# Patient Record
Sex: Female | Born: 1945 | Race: White | Hispanic: No | Marital: Married | Smoking: Former smoker
Health system: Southern US, Community
[De-identification: ages and names within clinical notes are randomized; demographics above are authoritative.]

## PROBLEM LIST (undated history)

## (undated) DIAGNOSIS — Z87898 Personal history of other specified conditions: Secondary | ICD-10-CM

## (undated) DIAGNOSIS — T8201XA Breakdown (mechanical) of heart valve prosthesis, initial encounter: Secondary | ICD-10-CM

## (undated) DIAGNOSIS — I350 Nonrheumatic aortic (valve) stenosis: Secondary | ICD-10-CM

## (undated) DIAGNOSIS — I771 Stricture of artery: Secondary | ICD-10-CM

## (undated) DIAGNOSIS — K219 Gastro-esophageal reflux disease without esophagitis: Secondary | ICD-10-CM

## (undated) DIAGNOSIS — C4492 Squamous cell carcinoma of skin, unspecified: Secondary | ICD-10-CM

## (undated) DIAGNOSIS — I2510 Atherosclerotic heart disease of native coronary artery without angina pectoris: Secondary | ICD-10-CM

## (undated) DIAGNOSIS — R0683 Snoring: Secondary | ICD-10-CM

## (undated) DIAGNOSIS — J309 Allergic rhinitis, unspecified: Secondary | ICD-10-CM

## (undated) DIAGNOSIS — I447 Left bundle-branch block, unspecified: Secondary | ICD-10-CM

## (undated) DIAGNOSIS — R7611 Nonspecific reaction to tuberculin skin test without active tuberculosis: Secondary | ICD-10-CM

## (undated) DIAGNOSIS — I509 Heart failure, unspecified: Secondary | ICD-10-CM

## (undated) DIAGNOSIS — Z87440 Personal history of urinary (tract) infections: Secondary | ICD-10-CM

## (undated) DIAGNOSIS — R011 Cardiac murmur, unspecified: Secondary | ICD-10-CM

## (undated) DIAGNOSIS — R112 Nausea with vomiting, unspecified: Secondary | ICD-10-CM

## (undated) DIAGNOSIS — I779 Disorder of arteries and arterioles, unspecified: Secondary | ICD-10-CM

## (undated) DIAGNOSIS — Z8679 Personal history of other diseases of the circulatory system: Secondary | ICD-10-CM

## (undated) DIAGNOSIS — R7302 Impaired glucose tolerance (oral): Secondary | ICD-10-CM

## (undated) DIAGNOSIS — Z8719 Personal history of other diseases of the digestive system: Secondary | ICD-10-CM

## (undated) DIAGNOSIS — M549 Dorsalgia, unspecified: Secondary | ICD-10-CM

## (undated) DIAGNOSIS — E785 Hyperlipidemia, unspecified: Secondary | ICD-10-CM

## (undated) DIAGNOSIS — K649 Unspecified hemorrhoids: Secondary | ICD-10-CM

## (undated) DIAGNOSIS — M1990 Unspecified osteoarthritis, unspecified site: Secondary | ICD-10-CM

## (undated) DIAGNOSIS — Z90710 Acquired absence of both cervix and uterus: Secondary | ICD-10-CM

## (undated) DIAGNOSIS — Z952 Presence of prosthetic heart valve: Secondary | ICD-10-CM

## (undated) DIAGNOSIS — J189 Pneumonia, unspecified organism: Secondary | ICD-10-CM

## (undated) DIAGNOSIS — G8929 Other chronic pain: Secondary | ICD-10-CM

## (undated) DIAGNOSIS — K5732 Diverticulitis of large intestine without perforation or abscess without bleeding: Secondary | ICD-10-CM

## (undated) DIAGNOSIS — C801 Malignant (primary) neoplasm, unspecified: Secondary | ICD-10-CM

## (undated) DIAGNOSIS — I219 Acute myocardial infarction, unspecified: Secondary | ICD-10-CM

## (undated) DIAGNOSIS — K5790 Diverticulosis of intestine, part unspecified, without perforation or abscess without bleeding: Secondary | ICD-10-CM

## (undated) DIAGNOSIS — Z9889 Other specified postprocedural states: Secondary | ICD-10-CM

## (undated) DIAGNOSIS — R350 Frequency of micturition: Secondary | ICD-10-CM

## (undated) HISTORY — DX: Acute myocardial infarction, unspecified: I21.9

## (undated) HISTORY — PX: ENDOMETRIAL ABLATION: SHX621

## (undated) HISTORY — DX: Hyperlipidemia, unspecified: E78.5

## (undated) HISTORY — DX: Left bundle-branch block, unspecified: I44.7

## (undated) HISTORY — DX: Allergic rhinitis, unspecified: J30.9

## (undated) HISTORY — DX: Atherosclerotic heart disease of native coronary artery without angina pectoris: I25.10

## (undated) HISTORY — DX: Nonspecific reaction to tuberculin skin test without active tuberculosis: R76.11

## (undated) HISTORY — PX: BACK SURGERY: SHX140

## (undated) HISTORY — PX: DILATION AND CURETTAGE OF UTERUS: SHX78

## (undated) HISTORY — DX: Stricture of artery: I77.1

## (undated) HISTORY — PX: ABDOMINAL HYSTERECTOMY: SHX81

## (undated) HISTORY — DX: Impaired glucose tolerance (oral): R73.02

## (undated) HISTORY — PX: TONSILLECTOMY: SHX5217

## (undated) HISTORY — DX: Gastro-esophageal reflux disease without esophagitis: K21.9

## (undated) HISTORY — DX: Squamous cell carcinoma of skin, unspecified: C44.92

## (undated) HISTORY — PX: SKIN SURGERY: SHX2413

## (undated) HISTORY — DX: Acquired absence of both cervix and uterus: Z90.710

## (undated) HISTORY — DX: Other chronic pain: G89.29

## (undated) HISTORY — DX: Disorder of arteries and arterioles, unspecified: I77.9

## (undated) HISTORY — DX: Diverticulosis of intestine, part unspecified, without perforation or abscess without bleeding: K57.90

## (undated) HISTORY — DX: Diverticulitis of large intestine without perforation or abscess without bleeding: K57.32

## (undated) HISTORY — DX: Pneumonia, unspecified organism: J18.9

## (undated) HISTORY — DX: Personal history of other diseases of the circulatory system: Z86.79

## (undated) HISTORY — DX: Dorsalgia, unspecified: M54.9

## (undated) HISTORY — PX: BREAST LUMPECTOMY: SHX2

## (undated) HISTORY — PX: LUMBAR DISC SURGERY: SHX700

## (undated) HISTORY — PX: TONSILLECTOMY: SUR1361

## (undated) HISTORY — DX: Cardiac murmur, unspecified: R01.1

## (undated) HISTORY — PX: LAPAROSCOPIC LYSIS INTESTINAL ADHESIONS: SUR778

## (undated) HISTORY — DX: Nonrheumatic aortic (valve) stenosis: I35.0

---

## 1971-02-03 HISTORY — PX: APPENDECTOMY: SHX54

## 2000-02-03 DIAGNOSIS — I219 Acute myocardial infarction, unspecified: Secondary | ICD-10-CM

## 2000-02-03 HISTORY — DX: Acute myocardial infarction, unspecified: I21.9

## 2001-11-04 ENCOUNTER — Encounter: Payer: Self-pay | Admitting: Cardiology

## 2003-04-30 ENCOUNTER — Encounter: Payer: Self-pay | Admitting: Cardiology

## 2003-11-12 ENCOUNTER — Encounter: Payer: Self-pay | Admitting: Internal Medicine

## 2003-11-15 ENCOUNTER — Encounter: Payer: Self-pay | Admitting: Internal Medicine

## 2005-04-28 ENCOUNTER — Encounter: Payer: Self-pay | Admitting: Internal Medicine

## 2005-05-12 ENCOUNTER — Encounter: Payer: Self-pay | Admitting: Internal Medicine

## 2009-04-30 ENCOUNTER — Encounter
Admission: RE | Admit: 2009-04-30 | Discharge: 2009-04-30 | Payer: Self-pay | Admitting: Physical Medicine and Rehabilitation

## 2009-05-17 ENCOUNTER — Encounter (INDEPENDENT_AMBULATORY_CARE_PROVIDER_SITE_OTHER): Payer: Self-pay | Admitting: Orthopedic Surgery

## 2009-05-17 ENCOUNTER — Ambulatory Visit (HOSPITAL_COMMUNITY): Admission: RE | Admit: 2009-05-17 | Discharge: 2009-05-19 | Payer: Self-pay | Admitting: Orthopedic Surgery

## 2010-02-02 DIAGNOSIS — Z952 Presence of prosthetic heart valve: Secondary | ICD-10-CM

## 2010-02-02 HISTORY — DX: Presence of prosthetic heart valve: Z95.2

## 2010-02-02 HISTORY — PX: CORONARY ARTERY BYPASS GRAFT: SHX141

## 2010-02-02 HISTORY — PX: CARDIAC VALVE REPLACEMENT: SHX585

## 2010-02-02 HISTORY — PX: CARDIAC CATHETERIZATION: SHX172

## 2010-02-07 ENCOUNTER — Other Ambulatory Visit: Payer: Self-pay | Admitting: Internal Medicine

## 2010-02-07 ENCOUNTER — Encounter: Payer: Self-pay | Admitting: Internal Medicine

## 2010-02-07 ENCOUNTER — Ambulatory Visit
Admission: RE | Admit: 2010-02-07 | Discharge: 2010-02-07 | Payer: Self-pay | Source: Home / Self Care | Attending: Internal Medicine | Admitting: Internal Medicine

## 2010-02-07 DIAGNOSIS — K219 Gastro-esophageal reflux disease without esophagitis: Secondary | ICD-10-CM | POA: Insufficient documentation

## 2010-02-07 DIAGNOSIS — I38 Endocarditis, valve unspecified: Secondary | ICD-10-CM

## 2010-02-07 DIAGNOSIS — K5732 Diverticulitis of large intestine without perforation or abscess without bleeding: Secondary | ICD-10-CM | POA: Insufficient documentation

## 2010-02-07 DIAGNOSIS — J309 Allergic rhinitis, unspecified: Secondary | ICD-10-CM | POA: Insufficient documentation

## 2010-02-07 DIAGNOSIS — Z8679 Personal history of other diseases of the circulatory system: Secondary | ICD-10-CM | POA: Insufficient documentation

## 2010-02-07 HISTORY — DX: Endocarditis, valve unspecified: I38

## 2010-02-07 LAB — BASIC METABOLIC PANEL
BUN: 13 mg/dL (ref 6–23)
CO2: 29 mEq/L (ref 19–32)
Calcium: 9.2 mg/dL (ref 8.4–10.5)
Chloride: 101 mEq/L (ref 96–112)
Creatinine, Ser: 0.8 mg/dL (ref 0.4–1.2)
GFR: 82.04 mL/min (ref >60.00)
Glucose, Bld: 87 mg/dL (ref 70–99)
Potassium: 4.6 mEq/L (ref 3.5–5.1)
Sodium: 139 mEq/L (ref 135–145)

## 2010-02-07 LAB — CBC WITH DIFFERENTIAL/PLATELET
Basophils Absolute: 0.1 10*3/uL (ref 0.0–0.1)
Basophils Relative: 0.5 % (ref 0.0–3.0)
Eosinophils Absolute: 0.1 10*3/uL (ref 0.0–0.7)
Eosinophils Relative: 1 % (ref 0.0–5.0)
HCT: 39.3 % (ref 36.0–46.0)
Hemoglobin: 13.7 g/dL (ref 12.0–15.0)
Lymphocytes Relative: 23.8 % (ref 12.0–46.0)
Lymphs Abs: 2.3 10*3/uL (ref 0.7–4.0)
MCHC: 34.8 g/dL (ref 30.0–36.0)
MCV: 90.4 fl (ref 78.0–100.0)
Monocytes Absolute: 0.6 10*3/uL (ref 0.1–1.0)
Monocytes Relative: 6.4 % (ref 3.0–12.0)
Neutro Abs: 6.5 10*3/uL (ref 1.4–7.7)
Neutrophils Relative %: 68.3 % (ref 43.0–77.0)
Platelets: 283 10*3/uL (ref 150.0–400.0)
RBC: 4.35 Mil/uL (ref 3.87–5.11)
RDW: 12.5 % (ref 11.5–14.6)
WBC: 9.5 10*3/uL (ref 4.5–10.5)

## 2010-02-07 LAB — TSH: TSH: 3.81 u[IU]/mL (ref 0.35–5.50)

## 2010-02-07 LAB — LIPID PANEL
Cholesterol: 261 mg/dL — ABNORMAL HIGH (ref 0–200)
HDL: 40.3 mg/dL (ref >39.00)
Total CHOL/HDL Ratio: 6
Triglycerides: 596 mg/dL — ABNORMAL HIGH (ref 0.0–149.0)
VLDL: 119.2 mg/dL — ABNORMAL HIGH (ref 0.0–40.0)

## 2010-02-07 LAB — HEPATIC FUNCTION PANEL
ALT: 26 U/L (ref 0–35)
AST: 21 U/L (ref 0–37)
Albumin: 4.1 g/dL (ref 3.5–5.2)
Alkaline Phosphatase: 70 U/L (ref 39–117)
Bilirubin, Direct: 0.1 mg/dL (ref 0.0–0.3)
Total Bilirubin: 0.4 mg/dL (ref 0.3–1.2)
Total Protein: 7 g/dL (ref 6.0–8.3)

## 2010-02-07 LAB — LDL CHOLESTEROL, DIRECT: Direct LDL: 170.6 mg/dL

## 2010-02-12 ENCOUNTER — Encounter: Payer: Self-pay | Admitting: Internal Medicine

## 2010-02-12 ENCOUNTER — Ambulatory Visit (HOSPITAL_COMMUNITY)
Admission: RE | Admit: 2010-02-12 | Discharge: 2010-02-12 | Payer: Self-pay | Source: Home / Self Care | Attending: Internal Medicine | Admitting: Internal Medicine

## 2010-02-12 ENCOUNTER — Ambulatory Visit: Admission: RE | Admit: 2010-02-12 | Discharge: 2010-02-12 | Payer: Self-pay | Source: Home / Self Care

## 2010-02-20 ENCOUNTER — Telehealth (INDEPENDENT_AMBULATORY_CARE_PROVIDER_SITE_OTHER): Payer: Self-pay | Admitting: *Deleted

## 2010-02-20 ENCOUNTER — Encounter: Payer: Self-pay | Admitting: Internal Medicine

## 2010-02-20 ENCOUNTER — Ambulatory Visit
Admission: RE | Admit: 2010-02-20 | Discharge: 2010-02-20 | Payer: Self-pay | Source: Home / Self Care | Attending: Cardiology | Admitting: Cardiology

## 2010-02-20 DIAGNOSIS — E785 Hyperlipidemia, unspecified: Secondary | ICD-10-CM | POA: Insufficient documentation

## 2010-02-20 DIAGNOSIS — R0602 Shortness of breath: Secondary | ICD-10-CM | POA: Insufficient documentation

## 2010-02-21 ENCOUNTER — Ambulatory Visit
Admission: RE | Admit: 2010-02-21 | Discharge: 2010-02-21 | Payer: Self-pay | Source: Home / Self Care | Attending: Internal Medicine | Admitting: Internal Medicine

## 2010-02-21 DIAGNOSIS — N952 Postmenopausal atrophic vaginitis: Secondary | ICD-10-CM | POA: Insufficient documentation

## 2010-02-24 ENCOUNTER — Encounter: Payer: Self-pay | Admitting: Internal Medicine

## 2010-02-24 ENCOUNTER — Encounter (HOSPITAL_COMMUNITY)
Admission: RE | Admit: 2010-02-24 | Discharge: 2010-03-04 | Payer: Self-pay | Source: Home / Self Care | Attending: Cardiology | Admitting: Cardiology

## 2010-02-24 ENCOUNTER — Ambulatory Visit: Admission: RE | Admit: 2010-02-24 | Discharge: 2010-02-24 | Payer: Self-pay | Source: Home / Self Care

## 2010-03-05 ENCOUNTER — Encounter: Payer: Self-pay | Admitting: Cardiology

## 2010-03-06 NOTE — Assessment & Plan Note (Signed)
Summary: new pt/secure horizons/#/lb   Vital Signs:  Patient profile:   65 year old female Height:      63 inches Weight:      172 pounds BMI:     30.58 O2 Sat:      94 % on Room air Temp:     97.5 degrees F oral Pulse rate:   92 / minute BP sitting:   152 / 80  (left arm) Cuff size:   regular  Vitals Entered By: Bill Salinas CMA (February 07, 2010 1:26 PM)  O2 Flow:  Room air CC: New pt here to est care with primary md/ab  Vision Screening:      Vision Comments: Last eye exam was in 2009 with slight change in prescription lens   Primary Care Provider:  Jacques Navy MD  CC:  New pt here to est care with primary md/ab.  History of Present Illness: Patient presents to establish for on-going continuity care.  CC: she is having sinus pressure in the frontal and facial sinuses with pain and pressure. She has a history of sinus problems several times a year. she has had no fever or chills, no coryza or rhinorrhea. She has taken sudafed last night but still sees no improvement.  She has intermittent cramps in the upper abdomen.   Preventive Screening-Counseling & Management  Alcohol-Tobacco     Alcohol drinks/day: <1     >5/day in last 3 mos: no     Smoking Status: quit     Packs/Day: 1.0     Year Quit: 2002  Caffeine-Diet-Exercise     Caffeine use/day: rarely     Does Patient Exercise: no  Hep-HIV-STD-Contraception     Dental Visit-last 6 months no     Sun Exposure-Excessive: no  Safety-Violence-Falls     Seat Belt Use: yes     Helmet Use: n/a     Firearms in the Home: no firearms in the home     Smoke Detectors: yes     Violence in the Home: no risk noted     Sexual Abuse: no     Fall Risk: low fall risk      Blood Transfusions:  no.    Current Medications (verified): 1)  Meclizine Hcl 12.5 Mg Tabs (Meclizine Hcl) .... As Needed For Dizziness  Allergies (verified): 1)  ! Bactrim 2)  ! Amoxicillin 3)  ! 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 Davy Pique - [place]               Past Surgical History: Tonsillectomy '55 Appendectomy '73 Hysterectomy '73 Excision of scar tissue -appy scar Back surgery '92- diskectomy and fusion (Dr. Deneen Harts - [place]) Back surgery -'11 L5-S1 diskectomy and decompression (Dr. 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  The patient denies anorexia, fever, weight loss, weight gain, vision loss, decreased hearing, hoarseness, chest pain, syncope, dyspnea on exertion, peripheral edema, headaches, hemoptysis, abdominal pain, melena, hematochezia, severe indigestion/heartburn, hematuria, incontinence, genital sores, muscle weakness, suspicious skin lesions, transient blindness, difficulty walking, depression, unusual weight change, abnormal bleeding, enlarged lymph nodes, angioedema, and breast masses.         sciatica - right leg.  Physical Exam  General:  overweight white woman in no distress Head:  Normocephalic and atraumatic without obvious abnormalities. No apparent alopecia or balding. No tenderness to  percussion over sinuses Eyes:  vision grossly intact, pupils equal, and pupils round.   Ears:  TMs normal Nose:  no external deformity and no external erythema.   Mouth:  Oral mucosa and oropharynx without lesions or exudates.  Teeth in good repair. Neck:  supple, full ROM, and no thyromegaly.   Lungs:  normal respiratory effort and normal breath sounds.   Heart:  normal rate and regular rhythm.  II/VI systolic murmur RSB, I/VI at LSB Abdomen:  soft and normal bowel sounds.   Msk:  normal ROM.   Neurologic:  alert & oriented X3, cranial nerves II-XII intact, gait normal, and DTRs symmetrical and normal.   Skin:  surgical scar at lumbar spine Cervical Nodes:  no anterior cervical adenopathy and no posterior cervical adenopathy.   Psych:  Oriented X3, memory intact for recent and remote, normally interactive, and good eye contact.     Impression & Recommendations:  Problem # 1:  HEART VALVE DISEASE (ICD-424.90) Patient does have a II/VI murmur at the RSB. Last echo cardiogram was evidently several years ago. With a h/o rheumatic fever follow-up 2 D echo is indicated.  Plan - refer to cardiology for 2 D echo.  Orders: Cardiology Referral (Cardiology) Cardiology Referral (Cardiology)  Problem # 2:  ALLERGIC RHINITIS (ICD-477.9) Patient without signs of bacterial infection, i.e. purulent drainage, fever, sinus tenderness. Suspect her symptoms are allergy related.  Plan - non-sedating antihistamine, e.g. loratadine           continue with sudafed for sinus pressure discomfort.  Problem # 3:  GERD (ICD-530.81) No active problem at this time.  Problem # 4:  Preventive Health Care (ICD-V70.0) Routine labs ordered. She will need to return for a well woman exam with pelvic and PAP smear ( last one if normal) and breast exam. She will check on last colonosocpy date and results. She is due for mammography. 12 lead EKG reveals PAC and a left bndle branch block.  Orders: TLB-Lipid Panel  (80061-LIPID) TLB-Hepatic/Liver Function Pnl (80076-HEPATIC) TLB-BMP (Basic Metabolic Panel-BMET) (80048-METABOL) TLB-CBC Platelet - w/Differential (85025-CBCD) TLB-TSH (Thyroid Stimulating Hormone) (84443-TSH)  addendum - labs reveal elevated cholesterol with LDL of 170. Will need to discuss at return OV.   Complete Medication List: 1)  Meclizine Hcl 12.5 Mg Tabs (Meclizine hcl) .... As needed for dizziness  Other Orders: Radiology Referral (Radiology)  Patient: Heather Crawford Note: All result statuses are Final unless otherwise noted.  Tests: (1) Lipid Panel (LIPID)   Cholesterol          [H]  261 mg/dL  0-200     ATP III Classification            Desirable:  < 200 mg/dL                    Borderline High:  200 - 239 mg/dL               High:  > = 240 mg/dL   Triglycerides        [H]  596.0 mg/dL                 1.6-109.6     Normal:  <150 mg/dL     Borderline High:  045 - 199 mg/dL     Triglyceride is over 400; calculations on Lipids are invalid.   HDL                       40.98 mg/dL                 >11.91   VLDL Cholesterol     [H]  119.2 mg/dL                 4.7-82.9  CHO/HDL Ratio:  CHD Risk                             6                    Men          Women     1/2 Average Risk     3.4          3.3     Average Risk          5.0          4.4     2X Average Risk          9.6          7.1     3X Average Risk          15.0          11.0                           Tests: (2) Hepatic/Liver Function Panel (HEPATIC)   Total Bilirubin           0.4 mg/dL                   5.6-2.1   Direct Bilirubin          0.1 mg/dL                   3.0-8.6   Alkaline Phosphatase      70 U/L                      39-117   AST                       21 U/L                      0-37   ALT                       26 U/L  0-35   Total Protein             7.0 g/dL                    2.4-4.0   Albumin                   4.1 g/dL                     1.0-2.7  Tests: (3) BMP (METABOL)   Sodium                    139 mEq/L                   135-145   Potassium                 4.6 mEq/L                   3.5-5.1   Chloride                  101 mEq/L                   96-112   Carbon Dioxide            29 mEq/L                    19-32   Glucose                   87 mg/dL                    25-36   BUN                       13 mg/dL                    6-44   Creatinine                0.8 mg/dL                   0.3-4.7   Calcium                   9.2 mg/dL                   4.2-59.5   GFR                       82.04 mL/min                >60.00  Tests: (4) CBC Platelet w/Diff (CBCD)   White Cell Count          9.5 K/uL                    4.5-10.5   Red Cell Count            4.35 Mil/uL                 3.87-5.11   Hemoglobin                13.7 g/dL                   63.8-75.6   Hematocrit                39.3 %  36.0-46.0   MCV                       90.4 fl                     78.0-100.0   MCHC                      34.8 g/dL                   32.4-40.1   RDW                       12.5 %                      11.5-14.6   Platelet Count            283.0 K/uL                  150.0-400.0   Neutrophil %              68.3 %                      43.0-77.0   Lymphocyte %              23.8 %                      12.0-46.0   Monocyte %                6.4 %                       3.0-12.0   Eosinophils%              1.0 %                       0.0-5.0   Basophils %               0.5 %                       0.0-3.0   Neutrophill Absolute      6.5 K/uL                    1.4-7.7   Lymphocyte Absolute       2.3 K/uL                    0.7-4.0   Monocyte Absolute         0.6 K/uL                    0.1-1.0  Eosinophils, Absolute                             0.1 K/uL                    0.0-0.7   Basophils Absolute        0.1 K/uL                    0.0-0.1  Tests: (5) TSH (TSH)   FastTSH                   3.81 uIU/mL  0.35-5.50  Tests: (6) Cholesterol LDL - Direct (DIRLDL)  Cholesterol LDL - Direct                             170.6 mg/dL     Optimal:  <244 mg/dL     Near or Above Optimal:  100-129 mg/dL     Borderline High:  010-272 mg/dL     High:  536-644 mg/dL     Very High:  >034 mg/dL  Orders Added: 1)  Radiology Referral [Radiology] 2)  Cardiology Referral [Cardiology] 3)  Cardiology Referral [Cardiology] 4)  New Patient Level IV [99204] 5)  TLB-Lipid Panel [80061-LIPID] 6)  TLB-Hepatic/Liver Function Pnl [80076-HEPATIC] 7)  TLB-BMP (Basic Metabolic Panel-BMET) [80048-METABOL] 8)  TLB-CBC Platelet - w/Differential [85025-CBCD] 9)  TLB-TSH (Thyroid Stimulating Hormone) [74259-DGL]  Appended Document: new pt/secure horizons/#/lb    Clinical Lists Changes  Orders: Added new Service order of Tdap => 47yrs IM (87564) - Signed Added new Service order of Admin 1st Vaccine (33295) - Signed Observations: Added new observation of TD BOOST VIS: 12/21/07 version given February 21, 2010. (02/21/2010 11:09) Added new observation of TD BOOSTERLO: [id number] (02/21/2010 11:09) Added new observation of TD BOOST EXP: 11/22/2011 (02/21/2010 11:09) Added new observation of TD BOOSTERBY: Ami Bullins CMA (02/21/2010 11:09) Added new observation of TD BOOSTERRT: IM (02/21/2010 11:09) Added new observation of TDBOOSTERDSE: 0.5 ml (02/21/2010 11:09) Added new observation of TD BOOSTERMF: GlaxoSmithKline (02/21/2010 11:09) Added new observation of TD BOOST SIT: left deltoid (02/21/2010 11:09) Added new observation of TD BOOSTER: Tdap (02/21/2010 11:09)       Immunizations Administered:  Tetanus Vaccine:    Vaccine Type: Tdap    Site: left deltoid    Mfr: GlaxoSmithKline    Dose: 0.5 ml    Route: IM    Given by: Ami Bullins CMA    Exp. Date: 11/22/2011    Lot #: [id number]    VIS given: 12/21/07 version given February 21, 2010.

## 2010-03-06 NOTE — Progress Notes (Signed)
Summary: Nuc Pre-Procedure  Phone Note Outgoing Call Call back at [facility] Phone [phone]   Call placed by: Antionette Char RN,  February 20, 2010 2:12 PM Call placed to: Patient Reason for Call: Confirm/change Appt Summary of Call: Left message with information on Myoview Information Sheet (see scanned document for details).      Nuclear Med Background Indications for Stress Test: Evaluation for Ischemia   History: Echo  History Comments: 02/12/10 Echo EF 45-50%  Symptoms: Dizziness, SOB    Nuclear Pre-Procedure Cardiac Risk Factors: Family History - CAD, History of Smoking, LBBB Height (in): 63

## 2010-03-06 NOTE — Assessment & Plan Note (Signed)
Summary: well women exam per dr Kelcy Laible/cd   Vital Signs:  Patient profile:   65 year old female Height:      63 inches Weight:      169 pounds BMI:     30.05 O2 Sat:      96 % on Room air Temp:     98.0 degrees F oral Pulse rate:   66 / minute BP sitting:   140 / 78  (left arm) Cuff size:   regular  Vitals Entered By: Ami Bullins CMA (February 21, 2010 11:07 AM)  O2 Flow:  Room air CC: cpx/ ab  Does patient need assistance? Functional Status Self care Ambulation Normal Comments PT is due for mammogram   Primary Care Provider:  Jacques Navy MD  CC:  cpx/ ab.  History of Present Illness: Patient returns after new patient intake earlier in January. She has reviewed that note and has no additions, corrections or deletions. Today she is for well-woman exam with breast exam and vulvo-vaginal exam (s/p hysterectomy) She has no complaints.  In the interval she has had a 2 D echo which does reveal MV regurgitation but normal EF. She has seen Dr. Shirlee Latch and has been scheduled for a myoview stress for further risk stratification.  Her lab had revealed elevated tryglycerides and and LDL of 170. She was started on simvastatin and Lovasa. She reports that the Lovasa is too expensive ($180+). Advised her to try otc fish oil and provided the equivalents whe will need to equal lovasa: 425 EPA 75 DHA. An alternative would be a fenofibrate.  Current Medications (verified): 1)  Meclizine Hcl 12.5 Mg Tabs (Meclizine Hcl) .... As Needed For Dizziness 2)  Antihistamine Decongestant 2.5-60 Mg Tabs (Triprolidine-Pseudoephedrine) .... As Needed 3)  Aspirin 81 Mg Tbec (Aspirin) .... One Daily 4)  Zocor 40 Mg Tabs (Simvastatin) .... One At Bedtime 5)  Lovaza 1 Gm Caps (Omega-3-Acid Ethyl Esters) .... Two Twice A Day  Allergies (verified): 1)  ! Bactrim 2)  ! Amoxicillin 3)  ! Neomycin PMH-FH-SH reviewed-no changes except otherwise noted  Review of Systems  The patient denies anorexia,  fever, weight loss, weight gain, hoarseness, chest pain, dyspnea on exertion, peripheral edema, headaches, abdominal pain, hematochezia, hematuria, muscle weakness, difficulty walking, depression, abnormal bleeding, enlarged lymph nodes, and breast masses.    Physical Exam  General:  Well-developed,well-nourished,in no acute distress; alert,appropriate and cooperative throughout examination Head:  normocephalic.   Breasts:  No mass, nodules, thickening, tenderness, bulging, retraction, inflamation, nipple discharge or skin changes noted.   Lungs:  normal respiratory effort and normal breath sounds.   Heart:  normal rate and regular rhythm.   Genitalia:  normal introitus, no external lesions, and mucosa pink and moist.  Scant vaginal discharge. Mild to moderate vaginal atrophy. Skin:  turgor normal and color normal.   Axillary Nodes:  no R axillary adenopathy and no L axillary adenopathy.   Inguinal Nodes:  no R inguinal adenopathy and no L inguinal adenopathy.     Impression & Recommendations:  Problem # 1:  VAGINITIS, ATROPHIC, POSTMENOPAUSAL (ICD-627.3) Patient with atrophic vaginitis and she does admit to mild dysparunia.  Plan - premarin topical vaginal cream daily for 1 week then twice a week.  Problem # 2:  HYPERLIPIDEMIA-MIXED (ICD-272.4) Patient has started simvastatin. She is tolerating this well. The Lovasa is too expensive and will try to take the equivalent dosing in otc Fish oil.  Plan - follow-up lab as ordered by Dr. Shirlee Latch  Her updated medication list for this problem includes:    Zocor 40 Mg Tabs (Simvastatin) ..... One at bedtime    Lovaza 1 Gm Caps (Omega-3-acid ethyl esters) .Marland Kitchen..Marland Kitchen Two twice a day  Problem # 3:  HEART VALVE DISEASE (ICD-424.90) Patient has had 2 D echo and is scheduled for NST. She will follow up with Dr. Shirlee Latch.   Her updated medication list for this problem includes:    Aspirin 81 Mg Tbec (Aspirin) ..... One daily  Problem # 4:  PREVENTIVE  HEALTH CARE (ICD-V70.0) Normal breast exam and vulvo-vaginal exam. Immunizations - per old records which are pending. Mammogram pending.  Complete Medication List: 1)  Meclizine Hcl 12.5 Mg Tabs (Meclizine hcl) .... As needed for dizziness 2)  Antihistamine Decongestant 2.5-60 Mg Tabs (Triprolidine-pseudoephedrine) .... As needed 3)  Aspirin 81 Mg Tbec (Aspirin) .... One daily 4)  Zocor 40 Mg Tabs (Simvastatin) .... One at bedtime 5)  Lovaza 1 Gm Caps (Omega-3-acid ethyl esters) .... Two twice a day   Orders Added: 1)  Est. Patient Level III [40981]

## 2010-03-06 NOTE — Progress Notes (Signed)
  ROI Faxed to [facility] [place],Records received back gave to [facility] [place]  February 20, 2010 1:39 PM

## 2010-03-06 NOTE — Assessment & Plan Note (Signed)
Summary: np6/heart vaule   Visit Type:  np Primary Provider:  Jacques Navy MD  CC:  shortness of breath and dizzines.  History of Present Illness: 65 yo with history of rheumatic fever and rheumatic valve disease presents to establish cardiology followup.  Patient had an episode as a child that is very consistent with rheumatic fever and has had a heart murmur since that time.  She has been followed by a cardiologist in [facility] in the past for aortic stenosis and mitral regurgitation.  She has chronic exertional dyspnea with walking up a hill or climbing a flight of steps.  She does ok on flat ground.  No chest pain.  No orthopnea or PND.  No tachypalpitations or syncope.  Echo recently showed moderate aortic stenosis and mild to moderate mitral regurgitation.  EF was mildly decreased (45-50%) with abnormal septal motion consistent with LBBB.   ECG: NSR, LBBB  Labs (1/12): TGs 596, HDL 40, LDL 171, HCT 39, K 4.6, creatinine 0.8  Current Medications (verified): 1)  Meclizine Hcl 12.5 Mg Tabs (Meclizine Hcl) .... As Needed For Dizziness 2)  Antihistamine Decongestant 2.5-60 Mg Tabs (Triprolidine-Pseudoephedrine) .... As Needed  Allergies (verified): 1)  ! Bactrim 2)  ! Amoxicillin 3)  ! Neomycin  Past History:  Past Medical History: 1. RHEUMATIC FEVER, HX OF (ICD-V12.59) 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 HEART VALVE DISEASE (ICD-424.90): Echo (1/12) with mildly dilated LV, EF 45-50% with paradoxical septal motion consistent with LBBB, moderate aortic stenosis with mean gradient 27 mmHg, trivial AI, mild to moderate mitral regurgitation with calcified mitral valve.  4. LBBB 5. ALLERGIC RHINITIS (ICD-477.9) 6. GERD (ICD-530.81) 7. Hx of DIVERTICULITIS, ACUTE (ICD-562.11) 8. Chronic low back pain with history of back surgery 9. Appendectomy 10. Hysterectomy 11. Hyperlipidemia  physician roster             Gyn - Dr. Davy Pique - [place]               Family  History: Reviewed history from 02/07/2010 and no changes required. Father - deceased @ 68: CAD/MI, CABG at 37 Mother - deceased @ 89: Uterine cancer, DM, HTN, hypothyroid, breast cancer Sister - breast cancer Aunt - colon cancer  Social History: Reviewed history from 02/07/2010 and no changes required. 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   Vital Signs:  Patient profile:   65 year old female Height:      63 inches Weight:      166.75 pounds BMI:     29.65 Pulse rate:   60 / minute BP sitting:   142 / 72  (left arm) Cuff size:   regular  Vitals Entered By: Caralee Ates CMA (February 20, 2010 12:06 PM)  Physical Exam  General:  Well developed, well nourished, in no acute distress. Head:  normocephalic and atraumatic Nose:  no deformity, discharge, inflammation, or lesions Mouth:  Teeth, gums and palate normal. Oral mucosa normal. Neck:  Neck supple, no JVD. No masses, thyromegaly or abnormal cervical nodes. Lungs:  Clear bilaterally to auscultation and percussion. Heart:  Non-displaced PMI, chest non-tender; regular rate and rhythm, S1,  S2 without rubs or gallops. 2/6 early to mid peaking crescendo-decrescendo murmur RUSB radiating to neck.  Carotid upstroke normal, no bruit.  Pedals normal pulses. No edema, no varicosities. Abdomen:  Bowel sounds positive; abdomen soft and non-tender without masses, organomegaly, or hernias noted. No hepatosplenomegaly. Extremities:  No clubbing or cyanosis. Neurologic:  Alert and oriented x 3. Skin:  Intact without lesions or rashes. Psych:  Normal affect.   Impression & Recommendations:  Problem # 1:  HEART VALVE DISEASE (ICD-424.90) Patient has rheumatic heart disease with moderate aortic stenosis  and mild to moderate mitral regurgitation.  She will need followup over time to assess for progression of valvular disease.  As she has no history of endocarditis, she does not have to take antibiotic prophylaxis with dental work according to the most recent guidelines.  Repeat echo in January 2013.   Problem # 2:  SHORTNESS OF BREATH (ICD-786.05) Patient has exertional dyspnea.  She is not volume overloaded on exam.  LV systolic function is mildly decreased.  This may be explained by paradoxical septal motion from the left bundle branch block.  I suspect that the main cause of this dyspnea is a degree of diastolic dysfunction.  Given her exertional dyspnea, risk factors (hyperlipidemia), and left bundle branch block, will do a stress test to assess for evidence of significant CAD.  She will need an adenosine study given her LBBB.  She should take ASA 81 mg daily.   Problem # 3:  HYPERLIPIDEMIA-MIXED (ICD-272.4) High LDL and dangerously high triglycerides (risk of pancreatitis).  I will start her on simvastatin 40 mg daily as well as Lovaza 2 g two times a day (alternatively, she could take fenofibrate 145 mg daily if this is less expensive for her).  Needs repeat lipids/LFTs in 2 months.  Needs to follow a heart healthy diet and get more exercise.   Other Orders: Echocardiogram (Echo) Nuclear Stress Test (Nuc Stress Test)  Patient Instructions: 1)  Your physician has recommended you make the following change in your medication:  2)  Start Aspirin 81mg  daily--this should be enteric coated. 3)  Start Zocor(simvastatin) 40mg  daily in the evening. 4)  Start Lovaza 2grams twice a day--this will be two 1gram capsules twice a day. 5)  Your physician recommends that you return for a FASTING lipid profile/liver profile in 2 months 786.09  396.2  272.0 6)  Your physician has requested that you have an adenosine myoview.  For further information please visit [email or web address]  Please follow instruction  sheet, as given. 7)  Your physician has requested that you have an echocardiogram.  Echocardiography is a painless test that uses sound waves to create images of your heart. It provides your doctor with information about the size and shape of your heart and how well your heart's chambers and valves are working.  This procedure takes approximately one hour. There are no restrictions for this procedure.JANUARY 2013 8)  Your physician wants you to follow-up in: 6 months with Dr Harmon Dun 2012)  You will receive a reminder letter in the mail two months in advance. If you don't receive a letter, please call our office to schedule the follow-up appointment. Prescriptions: LOVAZA 1 GM CAPS (OMEGA-3-ACID ETHYL ESTERS) two twice a day  #120 x 6   Entered by:   Katina Dung, RN, BSN   Authorized by:   Marca Ancona, MD   Signed by:   Katina Dung, RN, BSN on 02/20/2010   Method used:   Electronically to  [facility] [phone]* (retail)       [address] [place], [place]  [address]       Ph: [phone]       Fax: [phone]   RxID:   [id number] ZOCOR 40 MG TABS (SIMVASTATIN) one at bedtime  #30 x 3   Entered by:   Katina Dung, RN, BSN   Authorized by:   Marca Ancona, MD   Signed by:   Katina Dung, RN, BSN on 02/20/2010   Method used:   Electronically to        [id number]* (retail)       [address] [place], [place]  [address]       Ph: [phone]       Fax: [phone]   RxID:   [id number]

## 2010-03-06 NOTE — Letter (Signed)
   [facility] [address] [place], [place]  [address] Phone: [phone]      February 20, 2010   [place] [address] [place], [place] [address]  RE:  LAB RESULTS  Dear  Ms. Seiter,  The following is an interpretation of your most recent lab tests.  Please take note of any instructions provided or changes to medications that have resulted from your lab work.   2 D echo reveals mild aortic valve regurgitation. Ther is calcificatin of the mitral valve with mild to moderate regurgitation - thus you should have antibiotic prophylaxis before dental cleaning or other procedures.  Please come see me if you have any questions about these lab results    Sincerely Yours,    Jacques Navy MD

## 2010-03-06 NOTE — Assessment & Plan Note (Addendum)
Summary: Cardiology Nuclear Testing  Nuclear Med Background Indications for Stress Test: Evaluation for Ischemia, Abnormal EKG   History: Echo  History Comments: 02/12/10 Echo EF 45-50%  Symptoms: Dizziness, SOB    Nuclear Pre-Procedure Cardiac Risk Factors: Family History - CAD, History of Smoking, LBBB Caffeine/Decaff Intake: none NPO After: 7:30 AM Lungs: clear IV 0.9% NS with Angio Cath: 22g     IV Site: R Hand IV Started by: Cathlyn Parsons, RN Chest Size (in) 36      Cup Size D     Height (in): 63 Weight (lb): 167 BMI: 29.69  Nuclear Med Study 1 or 2 day study:  1 day     Stress Test Type:  Adenosine Reading MD:  Dietrich Pates, MD     Referring MD:  D.McLean Resting Radionuclide:  Technetium 6m Tetrofosmin     Resting Radionuclide Dose:  11.0 mCi  Stress Radionuclide:  Technetium 29m Tetrofosmin     Stress Radionuclide Dose:  33.0 mCi   Stress Protocol  Max Systolic BP: 137 mm HgDose of Adenosine:  42.5 mg    Stress Test Technologist:  Milana Na, EMT-P     Nuclear Technologist:  Doyne Keel, CNMT  Rest Procedure  Myocardial perfusion imaging was performed at rest 45 minutes following the intravenous administration of Technetium 70m Tetrofosmin.  Stress Procedure  The patient received IV adenosine at 140 mcg/kg/min for 4 minutes. There were no significant changes with infusion. Technetium 63m Tetrofosmin was injected at the 2 minute mark and quantitative spect images were obtained after a 45 minute delay.  QPS Raw Data Images:  Images were motion corrected.  Soft tissue (diaphragm, bowel activity, breast) surround heart. Stress Images:  Defect in the atherior wall (mid, distal) septal wall (base, mid) and apex.   Rest Images:  Partial improvement in the anterior wall (mid, distal) septal wall (mid) and apex. Subtraction (SDS):  These findings are consistent with ischemia. Transient Ischemic Dilatation:  1.07  (Normal <1.22)  Lung/Heart Ratio:  0.28   (Normal <0.45)  Quantitative Gated Spect Images QGS EDV:  99 ml QGS ESV:  44 ml QGS EF:  56 % QGS cine images:  distal anterior hypokinesis.   Overall Impression  Exercise Capacity: Lexiscan with no exercise. BP Response: Normal blood pressure response. Clinical Symptoms: No chest pain ECG Impression: Nondiagnostic because of baseline LBBB. Overall Impression Comments: Ischemia with scar in the anterior wall (mid, distal); septal wall (base, mid) and apex. Cannot completely exclude some shifting of soft tissue (breast) but this does not appear to explain all fo findings.  Appended Document: Cardiology Nuclear Testing Myoview was concerning for ischemia in the LAD distribution.  I will need to see her back in the office soon to discuss this.  It sounds like she may need cardiac cath.   Appended Document: Cardiology Nuclear Testing [facility]   Appended Document: Cardiology Nuclear Testing--please try to call her--Thanks. [facility]   Appended Document: Cardiology Nuclear Testing LMTCB./CY  Appended Document: Cardiology Nuclear Testing lmtcb./cy  Appended Document: Cardiology Nuclear Testing [facility]  Appended Document: Cardiology Nuclear Testing I mailed pt letter asking her to call to discuss results  Appended Document: Cardiology Nuclear Testing [facility]   Appended Document: Cardiology Nuclear Testing [facility]   Appended Document: Cardiology Nuclear Testing [facility]  Appended Document: Cardiology Nuclear Testing [facility]   Appended Document: Cardiology Nuclear Testing We have been unable to get in touch with this patient despite multiple phone calls and a letter.   Appended Document:  Cardiology Nuclear Testing attempted to contact pt at [phone] again--not able to leave a message at this number --I am unable to locate an alternate number for this pt  Appended Document: Cardiology Nuclear Testing I talked with pt today--she has been out of town--pt states she has been fine,she  denies chest pain/discomfort  and her symptoms are unchanged since she saw Dr Shirlee Latch in the office--I have given her an appt with Dr Shirlee Latch 03/26/10 for followup--I reviewed with Dr Shirlee Latch

## 2010-03-06 NOTE — Progress Notes (Signed)
  Phone Note Other Incoming   Request: Send information Summary of Call: Records received from [employer]. 52 pages forwarded to Dr. Debby Bud for review.

## 2010-03-12 NOTE — Letter (Signed)
Summary: Generic Letter  Architectural technologist, Main Office  [address]   [place], [place] [address]   Phone: [phone]  Fax: [phone]        March 05, 2010 MRN: [id number]    Heather Crawford [address] [place], [place]  [address]    Dear [name],  I have been unable to reach you by telephone to discuss your recent test results. Please call our office so I can discuss Dr Alford Highland recommendations with you.         Sincerely,  Katina Dung, RN, BSN  This letter has been electronically signed by your physician.

## 2010-03-26 ENCOUNTER — Ambulatory Visit (INDEPENDENT_AMBULATORY_CARE_PROVIDER_SITE_OTHER): Payer: PRIVATE HEALTH INSURANCE | Admitting: Cardiology

## 2010-03-26 ENCOUNTER — Encounter: Payer: Self-pay | Admitting: Internal Medicine

## 2010-03-26 ENCOUNTER — Encounter: Payer: Self-pay | Admitting: Cardiology

## 2010-03-26 DIAGNOSIS — R079 Chest pain, unspecified: Secondary | ICD-10-CM

## 2010-03-26 DIAGNOSIS — I10 Essential (primary) hypertension: Secondary | ICD-10-CM | POA: Insufficient documentation

## 2010-03-26 DIAGNOSIS — R9430 Abnormal result of cardiovascular function study, unspecified: Secondary | ICD-10-CM

## 2010-03-26 DIAGNOSIS — I359 Nonrheumatic aortic valve disorder, unspecified: Secondary | ICD-10-CM

## 2010-03-31 ENCOUNTER — Other Ambulatory Visit: Payer: Self-pay | Admitting: Cardiology

## 2010-03-31 ENCOUNTER — Encounter: Payer: Self-pay | Admitting: Internal Medicine

## 2010-03-31 ENCOUNTER — Other Ambulatory Visit (INDEPENDENT_AMBULATORY_CARE_PROVIDER_SITE_OTHER): Payer: PRIVATE HEALTH INSURANCE

## 2010-03-31 DIAGNOSIS — R0789 Other chest pain: Secondary | ICD-10-CM

## 2010-03-31 DIAGNOSIS — R079 Chest pain, unspecified: Secondary | ICD-10-CM

## 2010-03-31 DIAGNOSIS — R9430 Abnormal result of cardiovascular function study, unspecified: Secondary | ICD-10-CM

## 2010-03-31 LAB — CBC WITH DIFFERENTIAL/PLATELET
Basophils Absolute: 0 10*3/uL (ref 0.0–0.1)
Basophils Relative: 0.6 % (ref 0.0–3.0)
Eosinophils Absolute: 0.1 10*3/uL (ref 0.0–0.7)
Hemoglobin: 13 g/dL (ref 12.0–15.0)
Lymphocytes Relative: 30.2 % (ref 12.0–46.0)
Lymphs Abs: 1.8 10*3/uL (ref 0.7–4.0)
MCHC: 34.2 g/dL (ref 30.0–36.0)
MCV: 91.1 fl (ref 78.0–100.0)
Monocytes Absolute: 0.3 10*3/uL (ref 0.1–1.0)
Monocytes Relative: 4.9 % (ref 3.0–12.0)
Neutro Abs: 3.7 10*3/uL (ref 1.4–7.7)
Neutrophils Relative %: 63.1 % (ref 43.0–77.0)
Platelets: 244 10*3/uL (ref 150.0–400.0)

## 2010-03-31 LAB — BASIC METABOLIC PANEL
Creatinine, Ser: 0.6 mg/dL (ref 0.4–1.2)
GFR: 102.15 mL/min (ref >60.00)

## 2010-03-31 LAB — PROTIME-INR: INR: 0.9 ratio (ref 0.8–1.0)

## 2010-04-01 NOTE — Assessment & Plan Note (Signed)
Summary: rov/ appt at 8:00 per [facility]   Primary Provider:  Jacques Navy MD  CC:  her e to discuss test results/symptoms unchanged.  History of Present Illness: 65 yo with history of rheumatic fever and rheumatic valve disease presents for followup.  Patient had an episode as a child that is very consistent with rheumatic fever and has had a heart murmur since that time.  She has been followed by a cardiologist in [facility] in the past for aortic stenosis and mitral regurgitation.   Echo recently showed moderate aortic stenosis and mild to moderate mitral regurgitation.  EF was mildly decreased (45-50%) with abnormal septal motion consistent with LBBB.    Patient reports dyspnea with moderate exertion such as after walking 100 feet or walking up an incline. She also gets a tightness/pain in her neck when doing the same activities.  No chest pain.  No neck tightness at rest.  We did an adenosine myoview with EF 56% and evidence for ischemia and scar in the mid to apical anterior wall, septal wall, and apex.  BP is elevated today at 154/80.   ECG: NSR, LBBB  Labs (1/12): TGs 596, HDL 40, LDL 171, HCT 39, K 4.6, creatinine 0.8  Current Medications (verified): 1)  Meclizine Hcl 12.5 Mg Tabs (Meclizine Hcl) .... As Needed For Dizziness 2)  Claritin 10 Mg Tabs (Loratadine) .... One Daily 3)  Aspirin 81 Mg Tbec (Aspirin) .... One Daily 4)  Zocor 40 Mg Tabs (Simvastatin) .... One At Bedtime 5)  Lovaza 1 Gm Caps (Omega-3-Acid Ethyl Esters) .... One Three Times A Day 6)  Zantac 150 Mg Tabs (Ranitidine Hcl) .... One Daily 7)  Coreg 6.25 Mg Tabs (Carvedilol) .... One Twice A Day  Allergies: 1)  ! Bactrim 2)  ! Amoxicillin 3)  ! Neomycin 4)  ! * Adhesive Tape  Past History:  Past Medical History: 1. RHEUMATIC FEVER, HX OF (ICD-V12.59) 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 HEART VALVE DISEASE (ICD-424.90): Echo (1/12) with mildly dilated LV, EF 45-50% with paradoxical septal motion  consistent with LBBB, moderate aortic stenosis with mean gradient 27 mmHg, trivial AI, mild to moderate mitral regurgitation with calcified mitral valve.  4. LBBB 5. ALLERGIC RHINITIS (ICD-477.9) 6. GERD (ICD-530.81) 7. Hx of DIVERTICULITIS, ACUTE (ICD-562.11) 8. Chronic low back pain with history of back surgery 9. Appendectomy 10. Hysterectomy 11. Hyperlipidemia 12. CAD: Adenosine myoview (2/12) with EF 56%, ischemia with scar in the mid to apical anterior wall, septal wall, and apex.   physician roster             Gyn - Dr. Davy Pique - [place]               Family History: Reviewed history from 02/07/2010 and no changes required. Father - deceased @ 32: CAD/MI, CABG at 71 Mother - deceased @ 82: Uterine cancer, DM, HTN, hypothyroid, breast cancer Sister - breast cancer Aunt - colon cancer  Social History: Reviewed history from 02/20/2010 and no changes required. 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   Vital Signs:  Patient profile:   65 year old  female Height:      63 inches Weight:      171.50 pounds Pulse rate:   76 / minute BP sitting:   154 / 80  (right arm)  Vitals Entered By: Katina Dung, RN, BSN (March 26, 2010 8:31 AM)  Physical Exam  General:  Well developed, well nourished, in no acute distress. Neck:  Neck supple, no JVD. No masses, thyromegaly or abnormal cervical nodes. Lungs:  Clear bilaterally to auscultation and percussion. Heart:  Non-displaced PMI, chest non-tender; regular rate and rhythm, S1, S2 without rubs or gallops. 2/6 SEM RUSB.  Carotid upstroke normal, no bruit.  Pedals normal pulses. No edema, no varicosities. Abdomen:  Bowel sounds positive; abdomen soft and non-tender without  masses, organomegaly, or hernias noted. No hepatosplenomegaly. Extremities:  No clubbing or cyanosis. Neurologic:  Alert and oriented x 3. Psych:  Normal affect.   Impression & Recommendations:  Problem # 1:  CHEST PAIN (ICD-786.50) Patient has exertional neck tightness/pain and significant exertional dyspnea.  She has a LBBB.  Myoview was suggestive of possible LAD disease.  Her symptoms could certainly be angina.  I am going to arrange for a left heart catheterization to assess for CAD.  Continue ASA and statin, I will add a beta bllocker (especially with elevated BP).   Problem # 2:  HYPERLIPIDEMIA-MIXED (ICD-272.4) Check lipids/LFTs in 3/12.  If she has CAD on cath, goal LDL will be < 70.   Problem # 3:  HEART VALVE DISEASE (ICD-424.90) Patient has rheumatic heart disease with moderate aortic stenosis and mild to moderate mitral regurgitation.  She will need followup over time to assess for progression of valvular disease.  As she has no history of endocarditis, she does not have to take antibiotic prophylaxis with dental work according to the most recent guidelines.  Repeat echo in January 2013.   Problem # 4:  HYPERTENSION, UNSPECIFIED (ICD-401.9) Given elevated BP and possible coronary disease, starting carvedilol 6.25 mg two times a day.   Other Orders: Cardiac Catheterization (Cardiac Cath)  Patient Instructions: 1)  Your physician has recommended you make the following change in your medication:  2)  Start Coreg 6.25mg  twice a day. 3)  Your physician recommends that you return for lab work on Monday February 27,2012--BMP/CBC/PT 786.50  794.30 4)  Your physician has requested that you have a cardiac catheterization.  Cardiac catheterization is used to diagnose and/or treat various heart conditions. Doctors may recommend this procedure for a number of different reasons. The most common reason is to evaluate chest pain. Chest pain can be a symptom of coronary artery disease (CAD),  and cardiac catheterization can show whether plaque is narrowing or blocking your heart's arteries. This procedure is also used to evaluate the valves, as well as measure the blood flow and oxygen levels in different parts of your heart.  For further information please visit [email or web address]  Please follow instruction sheet, as given. 5)  MARCH 1,2012  6)  Your physician recommends that you schedule a follow-up appointment in: 2 weeks after the cath 04/03/10. 7)  Your physician recommends that you return for a FASTING lipid profile/liver profile in MARCH 2012  424.1  794.30 786.50:  Prescriptions: COREG 6.25 MG TABS (CARVEDILOL) one twice a day  #180 x 3   Entered by:   Katina Dung, RN, BSN   Authorized by:   Marca Ancona, MD   Signed by:   Katina Dung, RN, BSN on 03/26/2010   Method used:   Electronically  to        [id number] (retail)       [address] [place], [place]  [address]       Ph: [phone]       Fax: [phone]   RxID:   [id number]     Vital Signs:  Patient profile:   65 year old female Height:      63 inches Weight:      171.50 pounds Pulse rate:   76 / minute BP sitting:   154 / 80  (right arm)  Vitals Entered By: Katina Dung, RN, BSN (March 26, 2010 8:31 AM)   Serial Vital Signs/Assessments:  Comments: 152/78 left arm sitting  By: Katina Dung, RN, BSN

## 2010-04-01 NOTE — Letter (Signed)
Summary: [facility] - Stress  [facility]   Imported By: Marylou Mccoy 03/26/2010 10:21:03  _____________________________________________________________________  External Attachment:    Type:   Image     Comment:   External Document

## 2010-04-01 NOTE — Letter (Signed)
Summary: Cardiac Catheterization Instructions- [facility]  [facility], Main Office  [address]   [place], [place] [address]   Phone: [phone]  Fax: [phone]     03/26/2010 MRN: [id number]  Heather Crawford [address] [place], [place]  [address]  [place]  Dear Ms. Tomasetti,   You are scheduled for a Cardiac Catheterization on Thursday March 1,2012 with Dr.Dalton Shirlee Latch.  Please arrive to the 1st floor of the Heart and Vascular Center at [facility] at 7 am  on the day of your procedure. Please do not arrive before 6:30 a.m. Call the Heart and Vascular Center at [phone] if you are unable to make your appointment. The Code to get into the parking garage under the building is 3000. Take the elevators to the 1st floor. You must have someone to drive you home. Someone must be with you for the first 24 hours after you arrive home. Please wear clothes that are easy to get on and off and wear slip-on shoes. Do not eat or drink after midnight except water with your medications that morning. Bring all your medications and current insurance cards with you.  _   _x__ You may take ALL of your medications with water that morning. ________________________________________________________________________________________________________________________________   The usual length of stay after your procedure is 2 to 3 hours. This can vary.  If you have any questions, please call the office at the number listed above.   Katina Dung, RN, BSN

## 2010-04-03 ENCOUNTER — Ambulatory Visit (HOSPITAL_COMMUNITY): Payer: Medicare Other | Attending: Cardiovascular Disease

## 2010-04-03 ENCOUNTER — Inpatient Hospital Stay (HOSPITAL_BASED_OUTPATIENT_CLINIC_OR_DEPARTMENT_OTHER)
Admission: RE | Admit: 2010-04-03 | Discharge: 2010-04-03 | Disposition: A | Discharge status: Home / Self Care | Payer: Medicare Other | Source: Ambulatory Visit | Attending: Cardiology | Admitting: Cardiology

## 2010-04-03 DIAGNOSIS — R0989 Other specified symptoms and signs involving the circulatory and respiratory systems: Secondary | ICD-10-CM | POA: Insufficient documentation

## 2010-04-03 DIAGNOSIS — M542 Cervicalgia: Secondary | ICD-10-CM | POA: Insufficient documentation

## 2010-04-03 DIAGNOSIS — I2510 Atherosclerotic heart disease of native coronary artery without angina pectoris: Secondary | ICD-10-CM | POA: Insufficient documentation

## 2010-04-03 DIAGNOSIS — R0602 Shortness of breath: Secondary | ICD-10-CM | POA: Insufficient documentation

## 2010-04-03 DIAGNOSIS — R0609 Other forms of dyspnea: Secondary | ICD-10-CM | POA: Insufficient documentation

## 2010-04-03 DIAGNOSIS — Z01810 Encounter for preprocedural cardiovascular examination: Secondary | ICD-10-CM | POA: Insufficient documentation

## 2010-04-03 DIAGNOSIS — R9439 Abnormal result of other cardiovascular function study: Secondary | ICD-10-CM | POA: Insufficient documentation

## 2010-04-03 DIAGNOSIS — I209 Angina pectoris, unspecified: Secondary | ICD-10-CM | POA: Insufficient documentation

## 2010-04-03 DIAGNOSIS — I099 Rheumatic heart disease, unspecified: Secondary | ICD-10-CM | POA: Insufficient documentation

## 2010-04-07 ENCOUNTER — Encounter: Payer: Self-pay | Admitting: Internal Medicine

## 2010-04-08 ENCOUNTER — Observation Stay (HOSPITAL_COMMUNITY)
Admission: RE | Admit: 2010-04-08 | Discharge: 2010-04-09 | DRG: 287 | Disposition: A | Discharge status: Home / Self Care | Payer: PRIVATE HEALTH INSURANCE | Source: Ambulatory Visit | Attending: Cardiology | Admitting: Cardiology

## 2010-04-08 DIAGNOSIS — I2510 Atherosclerotic heart disease of native coronary artery without angina pectoris: Secondary | ICD-10-CM

## 2010-04-08 DIAGNOSIS — I359 Nonrheumatic aortic valve disorder, unspecified: Secondary | ICD-10-CM | POA: Insufficient documentation

## 2010-04-08 DIAGNOSIS — G8929 Other chronic pain: Secondary | ICD-10-CM | POA: Insufficient documentation

## 2010-04-08 DIAGNOSIS — E785 Hyperlipidemia, unspecified: Secondary | ICD-10-CM | POA: Insufficient documentation

## 2010-04-08 DIAGNOSIS — M545 Low back pain, unspecified: Secondary | ICD-10-CM | POA: Insufficient documentation

## 2010-04-08 DIAGNOSIS — Z87898 Personal history of other specified conditions: Secondary | ICD-10-CM | POA: Insufficient documentation

## 2010-04-08 DIAGNOSIS — J309 Allergic rhinitis, unspecified: Secondary | ICD-10-CM | POA: Insufficient documentation

## 2010-04-08 DIAGNOSIS — K219 Gastro-esophageal reflux disease without esophagitis: Secondary | ICD-10-CM | POA: Insufficient documentation

## 2010-04-08 DIAGNOSIS — I447 Left bundle-branch block, unspecified: Secondary | ICD-10-CM | POA: Insufficient documentation

## 2010-04-08 DIAGNOSIS — I059 Rheumatic mitral valve disease, unspecified: Secondary | ICD-10-CM | POA: Insufficient documentation

## 2010-04-08 LAB — CBC: MCV: 88.9 fL (ref 78.0–100.0)

## 2010-04-08 LAB — BASIC METABOLIC PANEL
BUN: 15 mg/dL (ref 6–23)
CO2: 25 mEq/L (ref 19–32)
Chloride: 104 mEq/L (ref 96–112)
Creatinine, Ser: 0.73 mg/dL (ref 0.4–1.2)
GFR calc Af Amer: >60 mL/min (ref >60)
Potassium: 4 mEq/L (ref 3.5–5.1)

## 2010-04-08 LAB — POCT ACTIVATED CLOTTING TIME: Activated Clotting Time: 476 seconds

## 2010-04-09 DIAGNOSIS — I200 Unstable angina: Secondary | ICD-10-CM

## 2010-04-09 LAB — BASIC METABOLIC PANEL
Chloride: 104 mEq/L (ref 96–112)
Creatinine, Ser: 0.69 mg/dL (ref 0.4–1.2)
GFR calc Af Amer: >60 mL/min (ref >60)
GFR calc non Af Amer: >60 mL/min (ref >60)

## 2010-04-09 LAB — PLATELET INHIBITION P2Y12
P2Y12 % Inhibition: 49 %
Platelet Function  P2Y12: 211 [PRU] (ref 194–418)

## 2010-04-09 LAB — CBC
MCH: 30.1 pg (ref 26.0–34.0)
Platelets: 229 10*3/uL (ref 150–400)
RBC: 3.65 MIL/uL — ABNORMAL LOW (ref 3.87–5.11)
RDW: 13.1 % (ref 11.5–15.5)
WBC: 7.3 10*3/uL (ref 4.0–10.5)

## 2010-04-10 NOTE — Letter (Signed)
Summary: [facility] - Echo  [facility] - Echo   Imported By: Marylou Mccoy 03/26/2010 10:22:40  _____________________________________________________________________  External Attachment:    Type:   Image     Comment:   External Document

## 2010-04-11 NOTE — Procedures (Signed)
Heather Crawford, Heather Crawford NO.:  [id number]  MEDICAL RECORD NO.:  [id number]           PATIENT TYPE:  LOCATION:                                 FACILITY:  PHYSICIAN:  Marca Ancona, MD      DATE OF BIRTH:  1945-09-07  DATE OF PROCEDURE:  04/03/2010 DATE OF DISCHARGE:                           CARDIAC CATHETERIZATION   PROCEDURES: 1. Left heart catheterization. 2. Coronary angiography. 3. Left ventriculography.  INDICATION:  This is a 65 year old who has a history of several months of exertional neck tightness and dyspnea.  She had a Myoview done to make sure ischemia is scarred in the mid apical anterior wall, septal wall, and the apex.  Therefore, she has elected to have left heart catheterization.  PROCEDURE NOTE:  After informed consent was obtained, the patient underwent Allen's testing on her right wrist.  The right ulnar artery gave good collateral circulation in the radial side of the hand constituting positive Allen's test.  The right radial area was sterilely prepped and draped.  Lidocaine 1% was used to locally anesthetize the right radial area.  The right radial artery was entered using modified Seldinger technique and 5-French arterial sheath was placed.  The patient received 3 mg of intra-arterial verapamil and 4000 units of IV heparin.  The right coronary artery was engaged best using the 3-D RCA catheter, though we did not engage it directly.  We were able to get adequate opacification of the artery.  The left coronary artery was engaged using the JL-3.5 catheter and the left ventricle was entered using angled pigtail catheter.  There were no complications.  FINDINGS: 1. Hemodynamics.  Aorta 111/46, LV 140/14.  Peak aortic valve gradient     was measured to be 27 mmHg. 2. Left ventriculography.  EF was estimated to be 55% with perhaps     mild basilar inferior hypokinesis. 3. Right coronary artery.  The right coronary artery was a dominant    vessel.  There was a 40-50% ostial RCA stenosis.  The mid RCA was calcified. There was a discrete     area of 90% to 95% stenosis in the distal RCA. 4. Left main.  The left main had no angiographic coronary artery     disease. 5. Left circumflex system.  There was a large ramus intermedius with     no significant disease.  The circumflex itself provided 2     relatively small obtuse marginals and had no significant disease. 6. LAD system.  There was a large first diagonal with minimal disease.     The mid LAD had about 30% stenosis.  IMPRESSION:  This is a 66 year old with a history of rheumatic heart disease who has had symptoms for several months consistent with stable angina.  She will get tightness in her neck and shortness of breath when she walks up a hill and it is more heavy with exertion.  She had a Myoview that was actually suggestive of the LAD territory ischemia in the mid LAD territory ischemia and infarction.  However, the patient's LAD has no significant disease on left heart cath today.  She is  actually found to have a 90% to 95% distal RCA stenosis as well as mild to moderate ostial disease and calcified mid RCA.  Given the stability of her symptoms, I am going to load her with Plavix over the weekend.  She is not a diabetic and I am also  going to start her on Imdur 30 mg daily.  She will have percutaneous coronary intervention on April 08, 2010, to her RCA with Dr. Riley Kill.     Marca Ancona, MD     DM/MEDQ  D:  04/03/2010  T:  04/03/2010  Job:  [id number]  cc:   Rosalyn Gess. Norins, MD  Electronically Signed by Marca Ancona MD on 04/10/2010 09:18:37 AM

## 2010-04-14 ENCOUNTER — Encounter: Payer: Self-pay | Admitting: Internal Medicine

## 2010-04-15 NOTE — Letter (Signed)
Summary: [facility]: CDC Pre-Cath Orders   [facility]: CDC Pre-Cath Orders   Imported By: Earl Many 04/10/2010 17:23:37  _____________________________________________________________________  External Attachment:    Type:   Image     Comment:   External Document

## 2010-04-16 ENCOUNTER — Ambulatory Visit (INDEPENDENT_AMBULATORY_CARE_PROVIDER_SITE_OTHER): Payer: PRIVATE HEALTH INSURANCE | Admitting: Thoracic Surgery (Cardiothoracic Vascular Surgery)

## 2010-04-16 DIAGNOSIS — I359 Nonrheumatic aortic valve disorder, unspecified: Secondary | ICD-10-CM

## 2010-04-16 DIAGNOSIS — I2510 Atherosclerotic heart disease of native coronary artery without angina pectoris: Secondary | ICD-10-CM

## 2010-04-16 DIAGNOSIS — I059 Rheumatic mitral valve disease, unspecified: Secondary | ICD-10-CM

## 2010-04-17 NOTE — Assessment & Plan Note (Signed)
OFFICE VISIT  Heather Crawford, Heather Crawford DOB:  [birth year]                                        April 16, 2010 CHART #:  [id number]  The patient is a 65 year old woman with history of aortic stenosis.  She recently has been having exertional neck tightness and dyspnea.  A Myoview suggest ischemia in the anterior septal wall.  She had echocardiogram done on February 12, 2010, which showed an EF of 40-50%. There was aortic stenosis with a valve area estimated at between 0.8 and 0.94 cm2 of some moderate annular calcification and mild-to-moderate mitral regurgitation.  She then had a cardiac catheterization where she was found to have 90% distal right coronary stenosis.  She is scheduled to have an angioplasty of the right coronary artery.  But at the time of attempted angioplasty, there were technical issues Dr. Riley Kill who is doing the procedure did not feel that he could safely proceed.  I was asked to see the patient in consultation, given the severity or aortic stenosis in combination with the critical lesion in her right coronary, I recommended aortic valve replacement and coronary bypass grafting with a right mammary to the right coronary.  Dr. Shirlee Latch and Dr. Riley Kill were in agreement with that.  We also talked to her about possibly needing a mitral annuloplasty as well but that would depend on the intraoperative assessment of the valve.  The other option was medical therapy, although given the symptomatic nature of her critical right coronary lesion and aortic valve that could be as tight as 0.8 cm2.  I felt the best option was to go ahead and address these issues now.  We did discuss, the relative advantages and disadvantages of each approach as well as the risks and benefits of the surgical procedure.  She had been loaded with Plavix, so she was discharged from the hospital and now returns to further discuss plan for surgery.  She also has a loose filling in  one of the molars on her right side of her mouth.  She has an appointment with her dentist scheduled for next Wednesday to have that filling replaced.  I met again today with Mr. and Mrs. Heather Crawford, her daughter who is a Engineer, civil (consulting) listened in by cell phone once again reviewed the issues while medical therapy is an option.  I do not think it is really good one in her case she has symptomatic critical right coronary stenosis and severe aortic stenosis, although it was read as moderate catheterization with the valve area potential being as tight as 0.8, that is an indication for surgery.  It is really impossible to determine what portion of her potential ischemia might be due to aortic stenosis, and the combination of aortic stenosis and CAD versus just the coronary lesion, and she is somewhat reluctant to proceed with surgery and had some concerns.  We discussed potential timing of the procedure, expected hospital stay, overall recovery, once again discussed that the risk of aortic valve placement, coronary bypass grafting.  In her particular case, I think it is relatively low with approximately 1% risk of operative mortality.  She understands the other risks which has previously been discussed including stroke, MI, complete heart block requiring pacemaker, bleeding, possible need for transfusions, infections, respiratory, or renal complications.  We did again discuss valve options and the preference for tissue valve  in her case and plan for short-term coumadinization afterwards for 6 to 8 weeks until the bowel has healed in, and she can be on an aspirin a day after that.  The patient has decided to proceed with surgery.  We will schedule it for Thursday, May 01, 2010, which is the first day that she is willing to have the procedure done.  She will be admitted on the day of surgery.  Salvatore Decent Dorris Fetch, M.D. Electronically Signed  SCH/MEDQ  D:  04/16/2010  T:  04/17/2010  Job:   [id number]  cc:   Marca Ancona, MD Rosalyn Gess. Norins, MD

## 2010-04-17 NOTE — Consult Note (Signed)
NAMEBRANDII, Heather Crawford            ACCOUNT NO.:  [id number]  MEDICAL RECORD NO.:  [id number]           PATIENT TYPE:  O  LOCATION:  [facility]                         FACILITY:  [facility]  PHYSICIAN:  Salvatore Decent. Dorris Fetch, M.D.DATE OF BIRTH:  January 22, 1946  DATE OF CONSULTATION: DATE OF DISCHARGE:                                CONSULTATION   REASON FOR CONSULTATION:  Single vessel CAD and aortic stenosis  HISTORY OF PRESENT ILLNESS:  Heather Crawford is a 65 year old woman referred for surgical intervention.  She is followed by Dr. Doylene Canning in [facility] in years.  She also has about a 10-year history of episodes where she becomes dizzy, nauseated and shortness of breath.  Currently, she started having new symptoms about 6 months ago when she noticed that when she walked into her garden which is down the hill, after walking about a 100 feet or up an incline she would get tightness in her neck that radiate to her jaws.  She is not having any chest pain or tightness.  There is no radiation to the arm.  She would get short of breath, but was not having any severe nausea or diaphoresis associated with that.  She saw Dr. Debby Bud who referred her to Dr. Shirlee Latch.  An echocardiogram was done which showed moderately severe aortic stenosis with the valve area estimated between 0.8 and 0.94 cm2 with an index of 0.52 cm2/m2.  Mean gradient was 27, peak gradient 41.  She had a nuclear study which showed a question of anterior ischemia and ejection fraction was 56%.  Due to the findings on the nuclear scan, cardiac catheterization was performed on April 03, 2010 which revealed severe disease in right coronary with 30% ostial, there was no significant disease in the LAD or circumflex. stenosis, 90% to 95% distal stenosis, ejection fraction was 55%, peak aortic valve gradient was 27 mmHg.  She was started on Plavix and scheduled for percutaneous intervention, which was to be done today. Dr. Riley Kill was attempting to  access the RCA but there was technical difficulty. He felt this was relatively high risk and he was uncomfortable doing that in the setting of aortic valve disease.  The procedure was aborted.  Past Medical History: Rheumatic fever, left bundle-branch block, gastroesophageal reflux, positive PPD, acute diverticulitis.  Past surgical History: hysterectomy. Medications: Chart not currently available Allergies: Chart not available ROS: See HPI all other systems negative   Physical Exam  General; WD, WN, NAD HEENT: poor dentition, otherwise unremarkable NECK:  Supple. CARDIAC:  Regular rate and rhythm.  There is a 6 systolic murmur heard best along the right sternal border. LUNGS:  Clear. ABDOMEN:  Soft, nontender. EXTREMITIES:  Without clubbing, cyanosis, or edema.  She has 2+ pulses throughout.  LABORATORY DATA:  See HPI for results of stress test, catheterizations and echocardiogram.  Her sodium is 137, potassium 4.0, BUN 15, creatinine 0.7, glucose 129.  PT 12.4, PTT 27.  White count 6.2, hematocrit 39,   Impression:  This is a 65 year old woman with single vessel coronary disease and moderately severe aortic stenosis who is symptomatic. She is not a candidate for PCI of the  right coronary. I agree with Dr. Shirlee Latch and Dr. Riley Kill that the best treament option is to proceed with CABG of the RCA and AVR. The mitral valve will be  evaluated with TEE at the time of surgery and may require an annuloplasty as well. We discussed in detail ther nature of the operation, technical aspects, expected length of stay and overall recovery. We also discussed valve options including tissue and  mechanical valves and their advantages and disadvantages. We discussed the indications, risks, benefits and alternatives(medical therapy). She understands the risks include but are not limited to death, stroke, MI, DVT, PE, bleeding, possible need for  transfusion, infection, CHB requiring permanent  pacemaker, and other organ system dysfunction including respiratory, renal, or GI complications.  She is leaning toward surgery but is undecided at this point in time. She will be discharged. She needs to see a dentist about a loose filling prior to elective surgery. I will see her back in the office next week to further discuss  and finalize her operative plan.     Salvatore Decent Dorris Fetch, M.D.   Note: Edited 04/17/10 large portions of text missing.  SCH/MEDQ  D:  04/08/2010  T:  04/08/2010  Job:  [id number]  Electronically Signed by Charlett Lango M.D. on 04/17/2010 02:51:00 PM

## 2010-04-18 ENCOUNTER — Other Ambulatory Visit (INDEPENDENT_AMBULATORY_CARE_PROVIDER_SITE_OTHER): Payer: Medicare Other

## 2010-04-18 ENCOUNTER — Other Ambulatory Visit: Payer: Self-pay | Admitting: Cardiology

## 2010-04-18 ENCOUNTER — Ambulatory Visit (INDEPENDENT_AMBULATORY_CARE_PROVIDER_SITE_OTHER): Payer: Medicare Other | Admitting: Cardiology

## 2010-04-18 ENCOUNTER — Encounter: Payer: Self-pay | Admitting: Cardiology

## 2010-04-18 DIAGNOSIS — Z79899 Other long term (current) drug therapy: Secondary | ICD-10-CM

## 2010-04-18 DIAGNOSIS — I2510 Atherosclerotic heart disease of native coronary artery without angina pectoris: Secondary | ICD-10-CM

## 2010-04-18 DIAGNOSIS — E785 Hyperlipidemia, unspecified: Secondary | ICD-10-CM

## 2010-04-18 DIAGNOSIS — I359 Nonrheumatic aortic valve disorder, unspecified: Secondary | ICD-10-CM

## 2010-04-18 DIAGNOSIS — I25119 Atherosclerotic heart disease of native coronary artery with unspecified angina pectoris: Secondary | ICD-10-CM | POA: Insufficient documentation

## 2010-04-18 LAB — HEPATIC FUNCTION PANEL
ALT: 18 U/L (ref 0–35)
Albumin: 3.9 g/dL (ref 3.5–5.2)
Bilirubin, Direct: 0.1 mg/dL (ref 0.0–0.3)
Total Protein: 6.5 g/dL (ref 6.0–8.3)

## 2010-04-18 LAB — LIPID PANEL: Triglycerides: 221 mg/dL — ABNORMAL HIGH (ref 0.0–149.0)

## 2010-04-21 ENCOUNTER — Other Ambulatory Visit: Payer: Self-pay

## 2010-04-23 LAB — CBC
HCT: 37 % (ref 36.0–46.0)
Hemoglobin: 12.6 g/dL (ref 12.0–15.0)
MCV: 90.1 fL (ref 78.0–100.0)
RDW: 12.4 % (ref 11.5–15.5)

## 2010-04-23 LAB — BASIC METABOLIC PANEL
BUN: 11 mg/dL (ref 6–23)
Creatinine, Ser: 0.68 mg/dL (ref 0.4–1.2)
GFR calc Af Amer: >60 mL/min (ref >60)
GFR calc non Af Amer: >60 mL/min (ref >60)
Sodium: 138 mEq/L (ref 135–145)

## 2010-04-25 NOTE — Discharge Summary (Signed)
NAMEALANTRA, POPOCA NO.:  [id number]  MEDICAL RECORD NO.:  [id number]           PATIENT TYPE:  I  LOCATION:  2313                         FACILITY:  [facility]  PHYSICIAN:  Marca Ancona, MD      DATE OF BIRTH:  [birth year]  DATE OF ADMISSION:  04/08/2010 DATE OF DISCHARGE:  04/09/2010                              DISCHARGE SUMMARY   PRIMARY CARDIOLOGIST:  Marca Ancona, MD  PRIMARY CARE PROVIDER:  Rosalyn Gess. Norins, MD  DISCHARGE DIAGNOSIS:  Unstable angina.  SECONDARY DIAGNOSES: 1. Severe single-vessel coronary artery disease involving the distal     right coronary artery with inability to perform percutaneous     intervention this admission. 2. Moderate aortic stenosis. 3. Mild-to-moderate mitral regurgitation. 4. Hyperlipidemia. 5. History of rheumatic fever. 6. Left bundle-branch block. 7. Gastroesophageal reflux disease. 8. Allergic rhinitis. 9. Status post appendectomy. 10.Status post hysterectomy. 11.Chronic low back pain, status post back surgery. 12.History of diverticulitis.  ALLERGIES: 1. BACTRIM. 2. AMOXICILLIN. 3. NEOMYCIN. 4. ADHESIVE TAPE.  PROCEDURES:  Attempted percutaneous intervention involving the right coronary artery, which was aborted secondary to inability to obtain adequate guide catheter support.  HISTORY OF PRESENT ILLNESS:  A 65 year old female with prior history of rheumatic fever and known mitral regurgitation and aortic stenosis previously followed by Cardiology in [facility] who earlier this year saw Dr. Shirlee Latch in clinic with complaints of progressive dyspnea.  An outpatient adenosine Myoview was subsequently performed showing EF of 56% with evidence for ischemia and scar in the mid to apical anterior wall, septal wall, and apex.  She followed up in clinic on March 26, 2010.  Decision was made to pursue cardiac catheterization, which was performed on April 03, 2010, showing 90-95% distal right coronary  artery disease with otherwise nonobstructive disease, EF of 55%, and mild inferior hypokinesis.  She was later scheduled for PCI of the RCA.  HOSPITAL COURSE:  The patient presented to the [facility] Cath Lab on April 08, 2010, for planned percutaneous intervention to the right coronary artery.  Unfortunately, the interventional team was unable to adequately seat the guide catheter; and after multiple attempts, the procedure was aborted.  With known valvular disease and severe symptomatic single-vessel coronary artery disease, Thoracic Surgery was consulted, and the patient was evaluated by Dr. Charlett Lango who agreed that she would benefit from bypass grafting as well as aortic valve placement.  The patient was agreeable and Plavix was subsequently discontinued.  Notably, her P2Y12 was elevated at 211 with a 49% inhibition.  The patient has had no further chest pain and will be discharged home today in good condition.  She has a followup with Dr. Dorris Fetch next week on April 16, 2010, to finalize plans for surgical intervention.  DISCHARGE LABORATORIES:  Hemoglobin 11.0, hematocrit 33.1, WBC 7.3, platelets 229, P2Y12 211 with 49% inhibition.  Sodium 137, potassium 4.0, chloride 104, CO2 of 26, BUN 11, creatinine 0.69, glucose 144, calcium 8.3.  MRSA screen was negative.  DISPOSITION:  The patient will be discharged home today in good condition.  FOLLOWUP PLANS AND APPOINTMENTS:  The patient will follow up with Dr. Viviann Spare  Dorris Fetch on April 16, 2010, at 2:00 p.m.  Follow up with Dr. Shirlee Latch on April 18, 2010, at 8:00 a.m. at which time she is scheduled to have repeat lipids and LFTs.  DISCHARGE MEDICATIONS:  1..  Nitroglycerin 0.4 mg sublingual p.r.n. chest pain. 1. Aspirin 325 mg daily. 2. Claritin 10 mg daily. 3. Carvedilol 6.25 mg b.i.d. 4. Imdur 30 mg daily. 5. Lovaza 1 g t.i.d. 6. Meclizine 12.5 mg t.i.d. p.r.n. 7. Tylenol 325 mg q.4 h. p.r.n. headache. 8. Zantac  150 mg daily. 9. Zocor 40 mg nightly.  We have discontinued Plavix.  OUTSTANDING LABORATORY STUDIES:  The patient had lipids and LFT's scheduled in our office on April 18, 2010.  DURATION OF DISCHARGE ENCOUNTER:  45 minutes including physician time.     Nicolasa Ducking, ANP   ______________________________ Marca Ancona, MD    CB/MEDQ  D:  04/09/2010  T:  04/10/2010  Job:  [id number]  cc:   Salvatore Decent. Dorris Fetch, M.D. Rosalyn Gess Norins, MD  Electronically Signed by Nicolasa Ducking ANP on 04/21/2010 12:01:44 PM Electronically Signed by Marca Ancona MD on 04/25/2010 08:32:04 AM

## 2010-04-29 ENCOUNTER — Other Ambulatory Visit: Payer: Self-pay | Admitting: Thoracic Surgery (Cardiothoracic Vascular Surgery)

## 2010-04-29 ENCOUNTER — Telehealth: Payer: Self-pay | Admitting: Cardiology

## 2010-04-29 ENCOUNTER — Encounter (HOSPITAL_COMMUNITY)
Admission: RE | Admit: 2010-04-29 | Discharge: 2010-04-29 | Disposition: A | Discharge status: Home / Self Care | Payer: Medicare Other | Source: Ambulatory Visit | Attending: Thoracic Surgery (Cardiothoracic Vascular Surgery) | Admitting: Thoracic Surgery (Cardiothoracic Vascular Surgery)

## 2010-04-29 ENCOUNTER — Inpatient Hospital Stay (HOSPITAL_COMMUNITY)
Admission: RE | Admit: 2010-04-29 | Discharge: 2010-04-29 | Disposition: A | Discharge status: Home / Self Care | Payer: Medicare Other | Source: Ambulatory Visit | Attending: Thoracic Surgery (Cardiothoracic Vascular Surgery) | Admitting: Thoracic Surgery (Cardiothoracic Vascular Surgery)

## 2010-04-29 ENCOUNTER — Ambulatory Visit (HOSPITAL_COMMUNITY)
Admission: RE | Admit: 2010-04-29 | Discharge: 2010-04-29 | Disposition: A | Discharge status: Home / Self Care | Payer: Medicare Other | Source: Ambulatory Visit | Attending: Thoracic Surgery (Cardiothoracic Vascular Surgery) | Admitting: Thoracic Surgery (Cardiothoracic Vascular Surgery)

## 2010-04-29 DIAGNOSIS — Z01811 Encounter for preprocedural respiratory examination: Secondary | ICD-10-CM | POA: Insufficient documentation

## 2010-04-29 DIAGNOSIS — I2510 Atherosclerotic heart disease of native coronary artery without angina pectoris: Secondary | ICD-10-CM

## 2010-04-29 DIAGNOSIS — F17200 Nicotine dependence, unspecified, uncomplicated: Secondary | ICD-10-CM | POA: Insufficient documentation

## 2010-04-29 DIAGNOSIS — Z01812 Encounter for preprocedural laboratory examination: Secondary | ICD-10-CM | POA: Insufficient documentation

## 2010-04-29 DIAGNOSIS — I080 Rheumatic disorders of both mitral and aortic valves: Secondary | ICD-10-CM | POA: Insufficient documentation

## 2010-04-29 DIAGNOSIS — Q233 Congenital mitral insufficiency: Secondary | ICD-10-CM

## 2010-04-29 DIAGNOSIS — I350 Nonrheumatic aortic (valve) stenosis: Secondary | ICD-10-CM

## 2010-04-29 DIAGNOSIS — Z01810 Encounter for preprocedural cardiovascular examination: Secondary | ICD-10-CM | POA: Insufficient documentation

## 2010-04-29 DIAGNOSIS — R0602 Shortness of breath: Secondary | ICD-10-CM | POA: Insufficient documentation

## 2010-04-29 LAB — COMPREHENSIVE METABOLIC PANEL
ALT: 20 U/L (ref 0–35)
Alkaline Phosphatase: 50 U/L (ref 39–117)
BUN: 8 mg/dL (ref 6–23)
CO2: 23 mEq/L (ref 19–32)
Creatinine, Ser: 0.57 mg/dL (ref 0.4–1.2)
GFR calc Af Amer: >60 mL/min (ref >60)
Total Protein: 6.6 g/dL (ref 6.0–8.3)

## 2010-04-29 LAB — APTT: aPTT: 29 seconds (ref 24–37)

## 2010-04-29 LAB — CBC
HCT: 34.6 % — ABNORMAL LOW (ref 36.0–46.0)
Hemoglobin: 11.4 g/dL — ABNORMAL LOW (ref 12.0–15.0)
MCH: 29.7 pg (ref 26.0–34.0)
MCHC: 32.9 g/dL (ref 30.0–36.0)
MCV: 90.1 fL (ref 78.0–100.0)
Platelets: 287 10*3/uL (ref 150–400)
WBC: 6.3 10*3/uL (ref 4.0–10.5)

## 2010-04-29 LAB — SURGICAL PCR SCREEN: Staphylococcus aureus: NEGATIVE

## 2010-04-29 LAB — URINE MICROSCOPIC-ADD ON

## 2010-04-29 LAB — ABO/RH: ABO/RH(D): A POS

## 2010-04-29 LAB — BLOOD GAS, ARTERIAL
Acid-Base Excess: 0.7 mmol/L (ref 0.0–2.0)
FIO2: 0.21 %
TCO2: 26 mmol/L (ref 0–100)
pCO2 arterial: 39.5 mmHg (ref 35.0–45.0)
pH, Arterial: 7.414 — ABNORMAL HIGH (ref 7.350–7.400)

## 2010-04-29 LAB — URINALYSIS, ROUTINE W REFLEX MICROSCOPIC
Bilirubin Urine: NEGATIVE
Glucose, UA: NEGATIVE mg/dL
Ketones, ur: NEGATIVE mg/dL
Specific Gravity, Urine: 1.007 (ref 1.005–1.030)
pH: 6 (ref 5.0–8.0)

## 2010-04-29 LAB — PROTIME-INR: Prothrombin Time: 12.7 seconds (ref 11.6–15.2)

## 2010-04-29 LAB — HEMOGLOBIN A1C: Hgb A1c MFr Bld: 6.3 % — ABNORMAL HIGH (ref <5.7)

## 2010-04-29 NOTE — Telephone Encounter (Signed)
Stress,LOV faxed to [facility] @ [phone] 04/29/10/KM

## 2010-05-01 ENCOUNTER — Inpatient Hospital Stay (HOSPITAL_COMMUNITY): Payer: Medicare Other

## 2010-05-01 ENCOUNTER — Inpatient Hospital Stay (HOSPITAL_COMMUNITY)
Admission: RE | Admit: 2010-05-01 | Discharge: 2010-05-07 | DRG: 220 | Disposition: A | Discharge status: Home Health | Payer: Medicare Other | Source: Ambulatory Visit | Attending: Thoracic Surgery (Cardiothoracic Vascular Surgery) | Admitting: Thoracic Surgery (Cardiothoracic Vascular Surgery)

## 2010-05-01 ENCOUNTER — Other Ambulatory Visit: Payer: Self-pay | Admitting: Thoracic Surgery (Cardiothoracic Vascular Surgery)

## 2010-05-01 DIAGNOSIS — I209 Angina pectoris, unspecified: Secondary | ICD-10-CM | POA: Diagnosis present

## 2010-05-01 DIAGNOSIS — M545 Low back pain, unspecified: Secondary | ICD-10-CM | POA: Diagnosis present

## 2010-05-01 DIAGNOSIS — Z01810 Encounter for preprocedural cardiovascular examination: Secondary | ICD-10-CM

## 2010-05-01 DIAGNOSIS — J90 Pleural effusion, not elsewhere classified: Secondary | ICD-10-CM | POA: Diagnosis present

## 2010-05-01 DIAGNOSIS — K5732 Diverticulitis of large intestine without perforation or abscess without bleeding: Secondary | ICD-10-CM | POA: Diagnosis present

## 2010-05-01 DIAGNOSIS — D62 Acute posthemorrhagic anemia: Secondary | ICD-10-CM | POA: Diagnosis not present

## 2010-05-01 DIAGNOSIS — I359 Nonrheumatic aortic valve disorder, unspecified: Secondary | ICD-10-CM | POA: Diagnosis present

## 2010-05-01 DIAGNOSIS — J309 Allergic rhinitis, unspecified: Secondary | ICD-10-CM | POA: Diagnosis present

## 2010-05-01 DIAGNOSIS — E785 Hyperlipidemia, unspecified: Secondary | ICD-10-CM | POA: Diagnosis present

## 2010-05-01 DIAGNOSIS — I447 Left bundle-branch block, unspecified: Secondary | ICD-10-CM | POA: Diagnosis present

## 2010-05-01 DIAGNOSIS — K219 Gastro-esophageal reflux disease without esophagitis: Secondary | ICD-10-CM | POA: Diagnosis present

## 2010-05-01 DIAGNOSIS — D696 Thrombocytopenia, unspecified: Secondary | ICD-10-CM | POA: Diagnosis present

## 2010-05-01 DIAGNOSIS — I2510 Atherosclerotic heart disease of native coronary artery without angina pectoris: Secondary | ICD-10-CM

## 2010-05-01 LAB — CBC
HCT: 32.6 % — ABNORMAL LOW (ref 36.0–46.0)
Hemoglobin: 11 g/dL — ABNORMAL LOW (ref 12.0–15.0)
MCH: 29.9 pg (ref 26.0–34.0)
MCH: 29.8 pg (ref 26.0–34.0)
MCHC: 33.7 g/dL (ref 30.0–36.0)
MCHC: 33.5 g/dL (ref 30.0–36.0)
Platelets: 141 10*3/uL — ABNORMAL LOW (ref 150–400)
RBC: 3.52 MIL/uL — ABNORMAL LOW (ref 3.87–5.11)
RDW: 13.7 % (ref 11.5–15.5)

## 2010-05-01 LAB — POCT I-STAT 3, ART BLOOD GAS (G3+)
Acid-base deficit: 1 mmol/L (ref 0.0–2.0)
Acid-base deficit: 1 mmol/L (ref 0.0–2.0)
Patient temperature: 35.6
Patient temperature: 36.8
TCO2: 26 mmol/L (ref 0–100)
TCO2: 26 mmol/L (ref 0–100)
pH, Arterial: 7.353 (ref 7.350–7.400)
pH, Arterial: 7.36 (ref 7.350–7.400)

## 2010-05-01 LAB — POCT I-STAT, CHEM 8
Calcium, Ion: 1.02 mmol/L — ABNORMAL LOW (ref 1.12–1.32)
Creatinine, Ser: 0.7 mg/dL (ref 0.4–1.2)
Glucose, Bld: 156 mg/dL — ABNORMAL HIGH (ref 70–99)
Hemoglobin: 10.5 g/dL — ABNORMAL LOW (ref 12.0–15.0)
Sodium: 139 mEq/L (ref 135–145)
TCO2: 22 mmol/L (ref 0–100)

## 2010-05-01 LAB — PROTIME-INR: INR: 1.28 (ref 0.00–1.49)

## 2010-05-01 LAB — POCT I-STAT 4, (NA,K, GLUC, HGB,HCT)
Glucose, Bld: 117 mg/dL — ABNORMAL HIGH (ref 70–99)
Hemoglobin: 11.2 g/dL — ABNORMAL LOW (ref 12.0–15.0)
Potassium: 3.4 mEq/L — ABNORMAL LOW (ref 3.5–5.1)

## 2010-05-01 LAB — CREATININE, SERUM: GFR calc Af Amer: >60 mL/min (ref >60)

## 2010-05-01 NOTE — Assessment & Plan Note (Signed)
Summary: 2wk f/u from cath done 04/03/10/sl   Primary Provider:  Jacques Navy MD   History of Present Illness: 65 yo with history of rheumatic fever and rheumatic valve disease presents for followup.  Patient had an episode as a child that is very consistent with rheumatic fever and has had a heart murmur since that time.  She has been followed by a cardiologist in [facility] in the past for aortic stenosis and mitral regurgitation.   Echo recently showed moderate aortic stenosis and mild to moderate mitral regurgitation.  EF was mildly decreased (45-50%) with abnormal septal motion consistent with LBBB.   Patient reported dyspnea with moderate exertion such as after walking 100 feet or walking up an incline. She also would get a tightness/pain in her neck when doing the same activities.  No chest pain.  No neck tightness at rest.  We did an adenosine myoview with EF 56% and evidence for ischemia and scar in the mid to apical anterior wall, septal wall, and apex.    I did a left heart cath.  This showed significant RCA disease: 40% ostial, 60% mid, 95% distal.  Dr. Riley Kill attempted to intervene on the RCA but was unable to adequately seat the catheter for intervention.  Given the large territory supplied by the RCA, CVTS was consulted regarding CABG.  She has moderate AS as well as moderate MR as well.  Dr. Dorris Fetch has consulted, and the plan at this time is for CABG with SVG-RCA and bioprosthetic aortic valve +/- mitral valve ring.  Since LHC, patient has not been very active.  She has had only 1 further episode of exertional neck pain (when she was walking in the mall).  Surgery is scheduled for the end of the month.   ECG: NSR, LBBB  Labs (1/12): TGs 596, HDL 40, LDL 171, HCT 39, K 4.6, creatinine  Labs (2/12): K 4.8, creatinine 0.6  Current Medications (verified): 1)  Meclizine Hcl 12.5 Mg Tabs (Meclizine Hcl) .... As Needed For Dizziness 2)  Claritin 10 Mg Tabs (Loratadine) .... One  Daily 3)  Aspirin 81 Mg Tbec (Aspirin) .... One Daily 4)  Zocor 40 Mg Tabs (Simvastatin) .... One At Bedtime 5)  Lovaza 1 Gm Caps (Omega-3-Acid Ethyl Esters) .... One Three Times A Day 6)  Zantac 150 Mg Tabs (Ranitidine Hcl) .... One Daily 7)  Coreg 6.25 Mg Tabs (Carvedilol) .... One Twice A Day 8)  Isosorbide Mononitrate Cr 30 Mg Xr24h-Tab (Isosorbide Mononitrate) .... Take One Tablet By Mouth Daily 9)  Nitrostat 0.4 Mg Subl (Nitroglycerin) .Marland Kitchen.. 1 Tablet Under Tongue At Onset of Chest Pain; You May Repeat Every 5 Minutes For Up To 3 Doses.  Allergies (verified): 1)  ! Bactrim 2)  ! Amoxicillin 3)  ! Neomycin 4)  ! * Adhesive Tape  Past History:  Past Medical History: 1. RHEUMATIC FEVER, HX OF (ICD-V12.59) 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 HEART VALVE DISEASE (ICD-424.90): Echo (1/12) with mildly dilated LV, EF 45-50% with paradoxical septal motion consistent with LBBB, moderate aortic stenosis with mean gradient 27 mmHg, trivial AI, mild to moderate mitral regurgitation with calcified mitral valve.  4. LBBB 5. ALLERGIC RHINITIS (ICD-477.9) 6. GERD (ICD-530.81) 7. Hx of DIVERTICULITIS, ACUTE (ICD-562.11) 8. Chronic low back pain with history of back surgery 9. Appendectomy 10. Hysterectomy 11. Hyperlipidemia 12. CAD: Adenosine myoview (2/12) with EF 56%, ischemia with scar in the mid to apical anterior wall, septal wall, and apex.  LHC (3/12) with  EF 55%, 40% ostial RCA, 60% mid RCA, 95% distal RCA.  Intervention attempted but unable to adequately seat catheter.   physician roster             Gyn - Dr. Davy Pique - [place]               Social History: Reviewed history from 02/20/2010 and no changes required. 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   Vital Signs:  Patient profile:   65 year old female Weight:      173 pounds Pulse rate:   64 / minute Pulse rhythm:   regular BP sitting:   116 / 61  (left arm) Cuff size:   regular  Vitals Entered By: Scherrie Bateman, LPN (April 18, 2010 7:50 AM)  Physical Exam  General:  Well developed, well nourished, in no acute distress. Neck:  Neck supple, no JVD. No masses, thyromegaly or abnormal cervical nodes. Lungs:  Clear bilaterally to auscultation and percussion. Heart:  Non-displaced PMI, chest non-tender; regular rate and rhythm, S1, S2 without rubs or gallops. 2/6 mid-peaking SEM RUSB with some obscuration of S2.  Carotid upstroke normal, no bruit.  Pedals normal pulses. No edema, no varicosities. Abdomen:  Bowel sounds positive; abdomen soft and non-tender without masses, organomegaly, or hernias noted. No hepatosplenomegaly. Extremities:  No clubbing or cyanosis. Neurologic:  Alert and oriented x 3. Psych:  Normal affect.   Impression & Recommendations:  Problem # 1:  CAD, NATIVE VESSEL (ICD-414.01) Severely diseased RCA with stable angina.  There is a large RCA territory at risk.  Difficult to seat catheter for intervention.  I have discussed the situation with Drs Riley Kill and Dorris Fetch.  Patient has moderate AS and moderate MR.  She does not need valve repair/replacement at this time, but likely will progress to need AVR in 2-3 years.  I think that the best approach here with be revascularization of the RCA by CABG.  At the same time, we should go ahead and replace the aortic valve and possibly repair the mitral valve.  She will continue ASA, statin, Coreg, and Imdur.    Problem # 2:  HEART VALVE DISEASE (ICD-424.90) History of rheumatic fever.  As above, patient has moderate AS and moderate MR.  In and of itself, not yet necessary to do surgical repair.  However, will likely progress to surgery in a few years.  Since we are planning to bypass the  RCA, will also replace the aortic valve +/- mitral repair.   Problem # 3:  HYPERLIPIDEMIA-MIXED (ICD-272.4) Lipids/LFTs today with goal LDL < 70.   Other Orders: TLB-Hepatic/Liver Function Pnl (80076-HEPATIC) TLB-Lipid Panel (80061-LIPID)  Patient Instructions: 1)  Your physician recommends that you schedule a follow-up appointment in: 2 MONTHS WITH DR [facility] 2)  Your physician recommends that you return for lab work in: TODAY 3)  Your physician recommends that you continue on your current medications as directed. Please refer to the Current Medication list given to you today.

## 2010-05-02 ENCOUNTER — Inpatient Hospital Stay (HOSPITAL_COMMUNITY): Payer: Medicare Other

## 2010-05-02 LAB — GLUCOSE, CAPILLARY
Glucose-Capillary: 148 mg/dL — ABNORMAL HIGH (ref 70–99)
Glucose-Capillary: 142 mg/dL — ABNORMAL HIGH (ref 70–99)
Glucose-Capillary: 136 mg/dL — ABNORMAL HIGH (ref 70–99)
Glucose-Capillary: 192 mg/dL — ABNORMAL HIGH (ref 70–99)
Glucose-Capillary: 164 mg/dL — ABNORMAL HIGH (ref 70–99)

## 2010-05-02 LAB — POCT I-STAT 4, (NA,K, GLUC, HGB,HCT)
Glucose, Bld: 105 mg/dL — ABNORMAL HIGH (ref 70–99)
Glucose, Bld: 127 mg/dL — ABNORMAL HIGH (ref 70–99)
Glucose, Bld: 135 mg/dL — ABNORMAL HIGH (ref 70–99)
Glucose, Bld: 131 mg/dL — ABNORMAL HIGH (ref 70–99)
HCT: 31 % — ABNORMAL LOW (ref 36.0–46.0)
HCT: 25 % — ABNORMAL LOW (ref 36.0–46.0)
Hemoglobin: 7.5 g/dL — ABNORMAL LOW (ref 12.0–15.0)
Hemoglobin: 8.5 g/dL — ABNORMAL LOW (ref 12.0–15.0)
Potassium: 3.3 mEq/L — ABNORMAL LOW (ref 3.5–5.1)
Potassium: 5.9 mEq/L — ABNORMAL HIGH (ref 3.5–5.1)
Sodium: 143 mEq/L (ref 135–145)
Sodium: 132 mEq/L — ABNORMAL LOW (ref 135–145)

## 2010-05-02 LAB — BASIC METABOLIC PANEL
CO2: 24 mEq/L (ref 19–32)
Chloride: 105 mEq/L (ref 96–112)
GFR calc Af Amer: >60 mL/min (ref >60)
Sodium: 138 mEq/L (ref 135–145)

## 2010-05-02 LAB — POCT I-STAT 3, ART BLOOD GAS (G3+)
Acid-base deficit: 1 mmol/L (ref 0.0–2.0)
Bicarbonate: 26.4 mEq/L — ABNORMAL HIGH (ref 20.0–24.0)
Bicarbonate: 25 mEq/L — ABNORMAL HIGH (ref 20.0–24.0)
Patient temperature: 31.5
Patient temperature: 37
TCO2: 28 mmol/L (ref 0–100)
TCO2: 26 mmol/L (ref 0–100)
pH, Arterial: 7.551 — ABNORMAL HIGH (ref 7.350–7.400)

## 2010-05-02 LAB — POCT I-STAT, CHEM 8
BUN: 14 mg/dL (ref 6–23)
Chloride: 99 mEq/L (ref 96–112)
Creatinine, Ser: 0.9 mg/dL (ref 0.4–1.2)
Potassium: 5.3 mEq/L — ABNORMAL HIGH (ref 3.5–5.1)
Sodium: 131 mEq/L — ABNORMAL LOW (ref 135–145)

## 2010-05-02 LAB — CBC
HCT: 29.6 % — ABNORMAL LOW (ref 36.0–46.0)
HCT: 29.9 % — ABNORMAL LOW (ref 36.0–46.0)
Hemoglobin: 9.8 g/dL — ABNORMAL LOW (ref 12.0–15.0)
Hemoglobin: 9.8 g/dL — ABNORMAL LOW (ref 12.0–15.0)
MCH: 29.7 pg (ref 26.0–34.0)
MCV: 90.6 fL (ref 78.0–100.0)
Platelets: 137 10*3/uL — ABNORMAL LOW (ref 150–400)
RBC: 3.32 MIL/uL — ABNORMAL LOW (ref 3.87–5.11)
RBC: 3.3 MIL/uL — ABNORMAL LOW (ref 3.87–5.11)
WBC: 13.1 10*3/uL — ABNORMAL HIGH (ref 4.0–10.5)

## 2010-05-02 LAB — POCT I-STAT 3, VENOUS BLOOD GAS (G3P V)
Bicarbonate: 24.9 mEq/L — ABNORMAL HIGH (ref 20.0–24.0)
TCO2: 26 mmol/L (ref 0–100)
pCO2, Ven: 30.1 mmHg — ABNORMAL LOW (ref 45.0–50.0)
pH, Ven: 7.501 — ABNORMAL HIGH (ref 7.250–7.300)
pO2, Ven: 29 mmHg — CL (ref 30.0–45.0)

## 2010-05-02 LAB — MAGNESIUM: Magnesium: 2.4 mg/dL (ref 1.5–2.5)

## 2010-05-02 LAB — CREATININE, SERUM: GFR calc non Af Amer: >60 mL/min (ref >60)

## 2010-05-03 ENCOUNTER — Inpatient Hospital Stay (HOSPITAL_COMMUNITY): Payer: Medicare Other

## 2010-05-03 LAB — GLUCOSE, CAPILLARY
Glucose-Capillary: 115 mg/dL — ABNORMAL HIGH (ref 70–99)
Glucose-Capillary: 148 mg/dL — ABNORMAL HIGH (ref 70–99)
Glucose-Capillary: 150 mg/dL — ABNORMAL HIGH (ref 70–99)

## 2010-05-03 LAB — CBC
HCT: 28.7 % — ABNORMAL LOW (ref 36.0–46.0)
Hemoglobin: 9.5 g/dL — ABNORMAL LOW (ref 12.0–15.0)
MCH: 30.1 pg (ref 26.0–34.0)
MCHC: 33.1 g/dL (ref 30.0–36.0)
RBC: 3.16 MIL/uL — ABNORMAL LOW (ref 3.87–5.11)

## 2010-05-03 LAB — BASIC METABOLIC PANEL
BUN: 14 mg/dL (ref 6–23)
Chloride: 98 mEq/L (ref 96–112)
GFR calc non Af Amer: >60 mL/min (ref >60)
Glucose, Bld: 112 mg/dL — ABNORMAL HIGH (ref 70–99)
Potassium: 4.4 mEq/L (ref 3.5–5.1)
Sodium: 132 mEq/L — ABNORMAL LOW (ref 135–145)

## 2010-05-03 LAB — PROTIME-INR
INR: 1.29 (ref 0.00–1.49)
Prothrombin Time: 16.3 seconds — ABNORMAL HIGH (ref 11.6–15.2)

## 2010-05-04 ENCOUNTER — Inpatient Hospital Stay (HOSPITAL_COMMUNITY): Payer: Medicare Other

## 2010-05-04 LAB — GLUCOSE, CAPILLARY
Glucose-Capillary: 91 mg/dL (ref 70–99)
Glucose-Capillary: 137 mg/dL — ABNORMAL HIGH (ref 70–99)
Glucose-Capillary: 107 mg/dL — ABNORMAL HIGH (ref 70–99)
Glucose-Capillary: 170 mg/dL — ABNORMAL HIGH (ref 70–99)

## 2010-05-04 LAB — BASIC METABOLIC PANEL
BUN: 14 mg/dL (ref 6–23)
CO2: 28 mEq/L (ref 19–32)
Chloride: 95 mEq/L — ABNORMAL LOW (ref 96–112)
Glucose, Bld: 111 mg/dL — ABNORMAL HIGH (ref 70–99)
Potassium: 4.5 mEq/L (ref 3.5–5.1)
Sodium: 130 mEq/L — ABNORMAL LOW (ref 135–145)

## 2010-05-05 ENCOUNTER — Inpatient Hospital Stay (HOSPITAL_COMMUNITY): Payer: Medicare Other

## 2010-05-05 LAB — TYPE AND SCREEN
Unit division: 0
Unit division: 0

## 2010-05-05 LAB — BASIC METABOLIC PANEL
CO2: 33 mEq/L — ABNORMAL HIGH (ref 19–32)
Chloride: 93 mEq/L — ABNORMAL LOW (ref 96–112)
Creatinine, Ser: 0.61 mg/dL (ref 0.4–1.2)
GFR calc Af Amer: >60 mL/min (ref >60)
Glucose, Bld: 87 mg/dL (ref 70–99)

## 2010-05-05 LAB — CBC
HCT: 28.8 % — ABNORMAL LOW (ref 36.0–46.0)
MCH: 29.7 pg (ref 26.0–34.0)
MCHC: 33 g/dL (ref 30.0–36.0)
MCV: 90 fL (ref 78.0–100.0)
RDW: 13.4 % (ref 11.5–15.5)
WBC: 8.6 10*3/uL (ref 4.0–10.5)

## 2010-05-05 LAB — GLUCOSE, CAPILLARY

## 2010-05-06 ENCOUNTER — Inpatient Hospital Stay (HOSPITAL_COMMUNITY): Payer: Medicare Other

## 2010-05-06 LAB — BASIC METABOLIC PANEL
BUN: 12 mg/dL (ref 6–23)
Calcium: 8.6 mg/dL (ref 8.4–10.5)
GFR calc non Af Amer: >60 mL/min (ref >60)
Glucose, Bld: 222 mg/dL — ABNORMAL HIGH (ref 70–99)
Sodium: 136 mEq/L (ref 135–145)

## 2010-05-06 LAB — CBC
MCH: 29.4 pg (ref 26.0–34.0)
MCHC: 32.1 g/dL (ref 30.0–36.0)
Platelets: 262 10*3/uL (ref 150–400)
RBC: 3.27 MIL/uL — ABNORMAL LOW (ref 3.87–5.11)

## 2010-05-06 LAB — PROTIME-INR
INR: 1.16 (ref 0.00–1.49)
Prothrombin Time: 15 seconds (ref 11.6–15.2)

## 2010-05-07 NOTE — Op Note (Signed)
NAMELEVIA, Heather Crawford            ACCOUNT NO.:  [id number]  MEDICAL RECORD NO.:  [id number]           PATIENT TYPE:  I  LOCATION:  2303                         FACILITY:  [facility]  PHYSICIAN:  Salvatore Decent. Dorris Fetch, M.D.DATE OF BIRTH:  1945-02-17  DATE OF PROCEDURE:  05/01/2010 DATE OF DISCHARGE:                              OPERATIVE REPORT   Heather Crawford was brought to the preop holding area on May 01, 2010, where the Anesthesia Service placed a Swan-Ganz catheter and arterial blood pressure monitoring line.  Intravenous antibiotics were administered. She was taken to the operating room, anesthetized, and intubated.  A Foley catheter was placed.  Chest, abdomen, and legs were prepped and draped in usual fashion.  Transesophageal echocardiography was performed.  Findings as previously noted.  Please refer to Dr. Aurther Loft Massagee's chart for the dictated note for details of the procedure.  A median sternotomy was performed.  Hemostasis was achieved.  The right internal mammary artery was harvested using standard technique.  It was a good quality vessel.  The patient was heparinized prior to the distal end of the right mammary artery.  The pericardium was opened after confirming adequate anticoagulation with ACT measurement.  The aorta was cannulated via concentric 2-0 Ethilon pledgets of pursestring sutures.  A dual stage venous cannula was placed via pursestring suture in the rectal appendage. Cardiopulmonary bypass was instituted.  The patient was cooled to 32 degrees Celsius.  The right coronary was inspected and anastomotic site was chosen.  The conduits were inspected and was cut to length.  The right mammary did reach as a pedicle graft without tension.  A right ventricular vent was placed via pursestring suture in the right superior pulmonary vein directing the left ventricle.  Retrograde cardioplegic cannula was placed via pursestring sutured into the right atrium  and directed in the coronary sinus and antegrade cardioplegia cannula placed in the ascending aorta.  The aorta was crossclamped.  The left ventricle was emptied via both vents.  Cardiac arrest then was achieved with the combination of cold antegrade, blood cardioplegia, and topical iced saline.  After achieving a complete diastolic arrest and adequate myocardial septal cooling, the following distal anastomoses were performed.  First the right internal mammary artery was brought through a window in the pericardium.  The distal end was beveled and was anastomosed end-to- side to the distal right coronary.  This was just beyond the 99% stenosis.  There was some plaquing in the right coronary beyond the anastomosis.  This was only fair quality target.  The right mammary gave rise to multiple small 1-mm branch vessel and not suitable as 5+ targets.  The anastomosis was performed end-to-side with a running 8-0 Prolene suture.  At the completion of the anastomosis, the bulldog clamp was briefly removed to inspect for hemostasis.  The mammary pedicle was then tacked at the epicardial surface of the heart with 6-0 Prolene sutures.  Additional cardioplegia was administered via the retrograde cannula. Aortotomy was performed just above the sinotubular junction into the noncoronary sinus.  There was significant calcific plaque in the ascending aorta.  The aortic valve was inspected.  There  was partial fusion of the left and right calves.  There was moderate calcification of the leaflets.  There was mild calcification of the annulus.  The leaflets were excised.  Annulus sized for a 21-mm Magna Ease pericardial tissue valve, which was the maximum valve that could be placed.  2-0 Ethibond horizontal mattress sutures with subannular pledgets were placed circumferentially around the annulus, total of 13 sutures were utilized.  The valve was prepared per manufacturer's recommendations. The sutures  were passed through the sewing ring of the valve, the valve was lowered into place and sutures sequentially tied.  After the valve was in place, the annulus was probed with 5-French right angle, there were no gaps.  Coronary ostia were inspected and were free from impingement.  Rewarming was begun.  Little plaque was found during the operation, a small had to be debrided.  It was felt that the aortic valve be closed primarily.  A Hemashield patch was used and sewn into place with running 4-0 Prolene sutures on both the proximal and distal aspects of the aortotomy.  After completion of the closure of the aortotomy, the patient was placed in Trendelenburg position.  A warm dose of retrograde cardioplegia was administered.  De-airing maneuvers were performed and the aortic crossclamp was removed.  The total crossclamp time was 97 minutes.  The patient did not fibrillate, was bradycardic.  The aortotomy was inspected for hemostasis.  Initial 4-0 Prolene pledgeted sutures were utilized as needed.  Distal anastomosis also was inspected for hemostasis.  Retrograde cardioplegic cannula were removed after the patient rewarmed to a core temperature of 37 degrees Celsius.  Epicardial pacing wires were placed on the right ventricle, right atrium, and DDD pacing was initiated.  The patient weaned from cardiopulmonary bypass on the first attempt.  Total bypass time was 143 minutes.  Initial cardiac index was greater than 2 L per minute meter squared.  Left ventricular vent was removed as was the midline from the ascending aorta after ensuring that all air had been evacuated prior to weaning from bypass.  Postbypass transesophageal echocardiography revealed good function of the prosthetic valve, there were no perivalvular leaks.  The mitral valve was unchanged with 1+ mitral insufficiency.  A test dose of protamine was administered and was well tolerated.  The atrial and aortic cannulae were removed.   The remainder of protamine was administered without incident.  The chest was irrigated with 1 L of warm normal saline containing 1 g of vancomycin.  Hemostasis was achieved.  A left pleural and two mediastinal chest tube were placed in separate subcostal incisions.  The sternum was closed with a combination of single and double heavy-gauge interrupted stainless steel wires.  The pectoralis fascia, subcutaneous tissue, and skin were closed in standard fashion.  All sponge, needle, and instrument counts were correct at the end of the procedure.  The patient was taken from the operating room to the surgical intensive care unit in a good condition.     Salvatore Decent Dorris Fetch, M.D.     SCH/MEDQ  D:  05/01/2010  T:  05/02/2010  Job:  [id number]  Electronically Signed by Charlett Lango M.D. on 05/07/2010 11:58:23 AM

## 2010-05-07 NOTE — Op Note (Signed)
  NAMEENID, MAULTSBY            ACCOUNT NO.:  [id number]  MEDICAL RECORD NO.:  [id number]           PATIENT TYPE:  I  LOCATION:  2303                         FACILITY:  [facility]  PHYSICIAN:  Salvatore Decent. Dorris Fetch, M.D.DATE OF BIRTH:  1945/08/03  DATE OF PROCEDURE:  05/01/2010 DATE OF DISCHARGE:                              OPERATIVE REPORT   PREOPERATIVE DIAGNOSIS:  Single-vessel coronary artery disease and severe aortic stenosis with exertional angina.  POSTOPERATIVE DIAGNOSES:  Single-vessel coronary artery disease and severe aortic stenosis with exertional angina plus 1+ mitral regurgitation.  PROCEDURES:  Median sternotomy, extracorporeal circulation, coronary artery bypass grafting x1 (right internal mammary artery to right coronary), aortic valve replacement with 21 mm Magna Ease valve, model number 3300 TFX, serial number [id number].  SURGEON:  Salvatore Decent. Dorris Fetch, MD  ASSISTANT:  Coral Ceo, PA  ANESTHESIA:  General.  FINDINGS:  Transesophageal echocardiography revealed inferior hypokinesis, moderately severe aortic stenosis, 1+ mitral regurgitation with mild restriction of the posterior leaflet, the right coronary fair- quality target with plaquing distal to the anastomosis, right mammary good-quality conduit.  Aortic valve tricuspid with partial fusion of right and left cusps, moderate leaflet calcification, mild annular calcification.  Radiology revealed good function of prosthetic valve, no signs of valvular leaks.  CLINICAL NOTE:  Ms. Galdamez is a 65 year old woman with known aortic stenosis.  She presented with exertional anginal symptoms.  She underwent cardiac catheterization where she was found to have critical single-vessel disease of the right coronary artery.  She was scheduled for an elective outpatient percutaneous intervention, which was attempted by Dr. Riley Kill; however, there were issues which prevented safe completion of the procedure.  He  elected to stop the procedure. The patient was advised to undergo coronary artery bypass grafting and aortic valve replacement.  He indicated risks and benefits, discussed in detail, she understood the risk, accepted, and agreed to proceed.  OPERATIVE NOTE:  Ms. Hajduk brought to the preoperative holding area  Dictation Ends Here(see 2nd note dictated for same procedure)     Viviann Spare C. Dorris Fetch, M.D.     SCH/MEDQ  D:  05/01/2010  T:  05/02/2010  Job:  [id number]  cc:   Marca Ancona, MD Rosalyn Gess. Norins, MD  Electronically Signed by Charlett Lango M.D. on 05/07/2010 11:59:02 AM

## 2010-05-08 ENCOUNTER — Ambulatory Visit: Payer: Self-pay | Admitting: Internal Medicine

## 2010-05-08 DIAGNOSIS — I359 Nonrheumatic aortic valve disorder, unspecified: Secondary | ICD-10-CM

## 2010-05-08 DIAGNOSIS — Z954 Presence of other heart-valve replacement: Secondary | ICD-10-CM

## 2010-05-12 ENCOUNTER — Ambulatory Visit (INDEPENDENT_AMBULATORY_CARE_PROVIDER_SITE_OTHER): Payer: Medicare Other | Admitting: Cardiology

## 2010-05-12 DIAGNOSIS — I4891 Unspecified atrial fibrillation: Secondary | ICD-10-CM

## 2010-05-12 DIAGNOSIS — Z7901 Long term (current) use of anticoagulants: Secondary | ICD-10-CM

## 2010-05-15 NOTE — Procedures (Signed)
Heather Crawford, Heather Crawford            ACCOUNT NO.:  [id number]  MEDICAL RECORD NO.:  [id number]           PATIENT TYPE:  I  LOCATION:  2313                         FACILITY:  [facility]  PHYSICIAN:  Arturo Morton. Riley Kill, MD, FACCDATE OF BIRTH:  [birth year]  DATE OF PROCEDURE:  04/08/2010 DATE OF DISCHARGE:                           CARDIAC CATHETERIZATION   INDICATIONS:  Heather Crawford is a 65 year old who underwent diagnostic catheterization by Dr. Shirlee Latch.  She was noted to have a high-grade distal RCA stenosis.  He was unable to cannulate the RCA during diagnostic catheterization, although the ostium did not appear to be that tight.  The vessel is fairly heavily calcified.  The patient has moderately severe aortic valve stenosis with a velocity of between 3 and 4 meters per second and an estimated transvalvular gradient of about 25 mm.  She was brought in today for possible percutaneous intervention. Risks, benefits, and alternatives were discussed.  DESCRIPTION OF PROCEDURE:  The patient was brought to the catheterization laboratory, prepped and draped in the usual fashion. Through an anterior puncture, the femoral artery was easily entered.  A 6-French sheath was placed.  Bivalirudin was given according to protocol.  We then used a standard right catheter with side holes.  We were able to get near the ostium, but were not able to sit within the ostium and the right coronary guide had a downward takeoff.  We attempted several other guides including an AL 0.75 catheter, an upward takeoff right, an internal mammary catheter with side holes, an AR1, and a left bypass catheter.  None of these resulted in good coaxial engagement of the ostium.  Fluoro time was approaching about 27 minutes and given these options, it was felt that we could either cannulate with the native right catheter with a buddy wire or reconsider the options. The patient does have aortic regurgitation, so he is not a  good candidate for intra-aortic balloon pumping in the setting of an emergency and does have moderately severe aortic valve stenosis.  We planned a surgical consult prior to making a final decision regarding options.  The family was informed and also we reviewed the films together.  The femoral sheath was sewn into place.  The patient was taken to the holding area in satisfactory clinical condition.  HEMODYNAMIC DATA: 1. Left ventricular pressure 174/14. 2. Aortic pressure 153/65.  ANGIOGRAPHIC DATA:  The ostium of the right coronary artery has about 50- 70% narrowing, slightly hypodense.  The vessel then opens up and is moderately calcified throughout the midvessel.  There is about 30% plaquing.  There is then what may be a calcified nodule impinging on the mid-coronary vessel followed by a steep bend, and calcification again distally.  Distally, there is a 95% stenosis prior to the takeoff of a smaller PDA and moderate size posterolateral system.  CONCLUSIONS: 1. Moderately severe aortic valve stenosis accompanied by aortic     regurgitation. 2. Prior history of rheumatic fever. 3. High-grade RCA stenosis with moderately high-risk features for     percutaneous intervention.  PLAN:  I spoke with Dr. Dorris Fetch who will review the patient's findings.  We will  make team oriented decision regarding optimal treatment.     Arturo Morton. Riley Kill, MD, [facility]     TDS/MEDQ  D:  04/08/2010  T:  04/09/2010  Job:  [id number]  cc:   Marca Ancona, MD [facility]  Electronically Signed by Shawnie Pons MD [facility] on 05/15/2010 05:37:12 AM

## 2010-05-16 NOTE — Op Note (Signed)
Heather Crawford, Heather Crawford            ACCOUNT NO.:  [id number]  MEDICAL RECORD NO.:  [id number]           PATIENT TYPE:  I  LOCATION:  2303                         FACILITY:  [facility]  PHYSICIAN:  Burna Forts, M.D.DATE OF BIRTH:  12-Oct-1945  DATE OF PROCEDURE:  05/01/2010 DATE OF DISCHARGE:                              OPERATIVE REPORT   PROCEDURE:  Intraoperative transesophageal echocardiography.  SURGEON:  Burna Forts, MD  INDICATIONS FOR PROCEDURE:  Ms. Semple is a 65 year old patient of Dr. Charlett Lango who presents today for aortic valve replacement, possible mitral valve repair or replacement and coronary artery bypass grafting.  She has a known history of pneumatic fever.  She was brought to the holding area on the morning of surgery where under local anesthesia with sedation pulmonary artery and radial arterial lines were placed.  She was taken to the OR for routine induction of general anesthesia after which a TEE probe was prepared and passed oropharyngeally into the stomach and then slightly withdrawn for imaging of the cardiac structures.  PRECARDIOPULMONARY BYPASS TEE EXAMINATION:  Left ventricle.  The left ventricular chamber was seen initially in a short axis view.  It was a mildly dilated left ventricular chamber seen in this view.  There was good anterior and lateral wall contractility and thickening.  There was some mild dyssynergy noted in the septal area and there was some mild hypokinesis in the inferior wall in the left ventricular chamber in the short axis view.  Long and short axis views were obtained in detail. Papillary muscles were well outlined.  There were no masses noted within the LV chamber itself.  Mitral valve.  The mitral valve was initially seen in a 4-chamber view. There was a thickened anterior leaflet appreciated in a somewhat thickened, calcified, and restricted in its motion posterior leaflet. Calcium was apparent in a  segment of the posterior leaflet on the deep transgastric view.  There does appear to be mild restriction of movement in this posterior leaflet such that with color Doppler there is 1- to 1- 1/2+ mitral regurgitant jets appreciated.  Overall, the motion of the anterior leaflet appears normal.  There was no prolapse.  There were no flail segments.  Color wave across the inflow of the mitral valve reveals a maximum flow of 1.2 m/s2.  This was associated with a mean gradient of only approximately 5.  Pressure half-time calculations would indicate an estimated mitral valve area up to 2.2-2.3 cm2.  Again, this would be consistent with just mild or early moderate mitral regurgitant flow.  There was no significant restriction to inflow across the mitral valve.  Aortic valve.  The aortic valve was visualized.  There is significant annular calcium appreciated.  There was significant calcium along the edges of the leaflets of the aortic valve.  There was some degree of effusion of the left and right cusps of the aortic valve such that there was limited restriction of motion the air.  There was calcium on the edges of the noncoronary cusps but this was the more mobile of the two cusps such that the short axis view appeared to show  essentially bicuspid functional opening of the aortic valve.  Long and short axis views were obtained which shows a 3+ stenotic jet across this valve associated with a trivial or trace aortic regurgitant jet.  Right ventricle, tricuspid valve, and right atrium were essentially normal structures, shape, and function.  The patient was placed on cardiopulmonary bypass.  Coronary artery bypass grafting was carried out.  Aortotomy was performed.  Aortic valve replacement was carried out with the pericardial tissue valve placed. The patient was then rewarmed and separated from cardiopulmonary bypass with the initial attempt.  POST-CARDIOPULMONARY BYPASS TEE EXAMINATION:   Left ventricle.  Early in the bypass period, the left ventricular chamber was seen in the short axis view.  There was significant dyssynergy of the septal wall area and inferior wall area.  However, with time and separation of cardiopulmonary bypass, there appeared to be improvement in the left ventricular chamber wall function, especially the septal wall area in the short axis view with time from separation from cardiopulmonary bypass.  Overall, it was satisfactory contractile pattern appreciated both in long and short axis views.  Aortic valve.  In place of the diseased aortic valve can now be seen the perimeter of the tissue valve as well as the edges of the leaflets as they open during systolic ejection and close appropriately during diastole.  Long and short axis views across this valve suggested that it was seated well, functioning appropriately, and there was no regurgitant flow across the valve nor was there any obstruction to flow across this valve.  It appears to be satisfactorily placed pericardial tissue valve in the aortic position.  The rest of cardiac examination was as previously described without any significant changes.  There was essentially no increase in the regurgitant fraction or flow across the mitral valve which was considered only mild in the prebypass period.  The patient was ultimately returned to the Cardiac Intensive Care Unit in stable condition.          ______________________________ Burna Forts, M.D.     JTM/MEDQ  D:  05/01/2010  T:  05/02/2010  Job:  [id number]  cc:   [id number]  Electronically Signed by Ester Rink M.D. on 05/16/2010 09:21:01 AM

## 2010-05-19 ENCOUNTER — Ambulatory Visit: Payer: Self-pay | Admitting: Cardiology

## 2010-05-22 ENCOUNTER — Telehealth: Payer: Self-pay | Admitting: Cardiology

## 2010-05-22 ENCOUNTER — Encounter: Payer: Self-pay | Admitting: Physician Assistant

## 2010-05-22 NOTE — Telephone Encounter (Signed)
Spoke with pt who reports her home health nurse saw her this AM and was wondering if she should have chest X-ray done sooner than scheduled date of May 26, 2010.  Pt reports she is still SOB at times but that it has improved since when she was in hospital.  Pt discharged 05/07/10 following CABG and AVR.  States she tires easily but has been able to get out and walk.  I told  pt to contact surgeon's office regarding this question as they ordered chest X-ray.

## 2010-05-22 NOTE — Telephone Encounter (Signed)
Pt's home health nurse saw pt this am and pt still sob has chest xray Monday the 23rd, she wants to know if needs to have it sooner?

## 2010-05-23 ENCOUNTER — Other Ambulatory Visit: Payer: Self-pay | Admitting: Thoracic Surgery (Cardiothoracic Vascular Surgery)

## 2010-05-23 DIAGNOSIS — I359 Nonrheumatic aortic valve disorder, unspecified: Secondary | ICD-10-CM

## 2010-05-23 DIAGNOSIS — I2510 Atherosclerotic heart disease of native coronary artery without angina pectoris: Secondary | ICD-10-CM

## 2010-05-26 ENCOUNTER — Encounter: Payer: Self-pay | Admitting: Physician Assistant

## 2010-05-26 ENCOUNTER — Ambulatory Visit
Admission: RE | Admit: 2010-05-26 | Discharge: 2010-05-26 | Disposition: A | Discharge status: Home / Self Care | Payer: Medicare Other | Source: Ambulatory Visit | Attending: Thoracic Surgery (Cardiothoracic Vascular Surgery) | Admitting: Thoracic Surgery (Cardiothoracic Vascular Surgery)

## 2010-05-26 ENCOUNTER — Ambulatory Visit (INDEPENDENT_AMBULATORY_CARE_PROVIDER_SITE_OTHER): Payer: Medicare Other | Admitting: Physician Assistant

## 2010-05-26 ENCOUNTER — Ambulatory Visit (INDEPENDENT_AMBULATORY_CARE_PROVIDER_SITE_OTHER): Payer: Medicare Other | Admitting: *Deleted

## 2010-05-26 ENCOUNTER — Encounter (INDEPENDENT_AMBULATORY_CARE_PROVIDER_SITE_OTHER): Payer: Self-pay

## 2010-05-26 VITALS — BP 158/70 | HR 86 | Resp 18 | Ht 63.0 in | Wt 168.4 lb

## 2010-05-26 DIAGNOSIS — Z954 Presence of other heart-valve replacement: Secondary | ICD-10-CM

## 2010-05-26 DIAGNOSIS — I359 Nonrheumatic aortic valve disorder, unspecified: Secondary | ICD-10-CM

## 2010-05-26 DIAGNOSIS — R0602 Shortness of breath: Secondary | ICD-10-CM

## 2010-05-26 DIAGNOSIS — Z952 Presence of prosthetic heart valve: Secondary | ICD-10-CM

## 2010-05-26 DIAGNOSIS — Z951 Presence of aortocoronary bypass graft: Secondary | ICD-10-CM

## 2010-05-26 DIAGNOSIS — I10 Essential (primary) hypertension: Secondary | ICD-10-CM

## 2010-05-26 DIAGNOSIS — Z9889 Other specified postprocedural states: Secondary | ICD-10-CM

## 2010-05-26 DIAGNOSIS — I2510 Atherosclerotic heart disease of native coronary artery without angina pectoris: Secondary | ICD-10-CM

## 2010-05-26 LAB — BASIC METABOLIC PANEL
BUN: 14 mg/dL (ref 6–23)
Chloride: 99 mEq/L (ref 96–112)
Creatinine, Ser: 0.8 mg/dL (ref 0.4–1.2)
GFR: 73.94 mL/min (ref >60.00)

## 2010-05-26 LAB — CBC WITH DIFFERENTIAL/PLATELET
Basophils Relative: 0.7 % (ref 0.0–3.0)
Eosinophils Absolute: 0.1 10*3/uL (ref 0.0–0.7)
Hemoglobin: 11.2 g/dL — ABNORMAL LOW (ref 12.0–15.0)
MCHC: 34.1 g/dL (ref 30.0–36.0)
MCV: 87.3 fl (ref 78.0–100.0)
Monocytes Absolute: 0.5 10*3/uL (ref 0.1–1.0)
Neutro Abs: 4.1 10*3/uL (ref 1.4–7.7)
RBC: 3.76 Mil/uL — ABNORMAL LOW (ref 3.87–5.11)

## 2010-05-26 LAB — POCT INR: INR: 3.6

## 2010-05-26 NOTE — Patient Instructions (Addendum)
Your physician recommends that you return for lab work in:  TODAY CBC, BMET 401.9, 786.05  KEEP YOUR APPT WITH DR. Shirlee Latch 06/18/10 @ 9 AM  PLEASE HAVE CHEST XRAY RESULTS FAXED TO OUR OFFICE [phone] TO SCOTT WEAVER, PA-C.  Your physician recommends that you continue on your current medications as directed. Please refer to the Current Medication list given to you today.

## 2010-05-26 NOTE — Assessment & Plan Note (Addendum)
Overall, I believe that her symptoms are probably related to deconditioning from her surgery.  Her decreased breath sounds on the right are minimal and seem consistent with a very small effusion.  I have asked her to cut back on her activity and to increase it more slowly.  She can walk 15 minutes twice a day instead of 30 minutes all at once.  She sees the surgeon this afternoon.  We will ask that her chest x-ray results get sent to [place].  She did have a rash recently.  She is allergic to sulfa drugs.  The only medication that has been taken recently that she is no longer on his Lasix.  Her rash is improving. If she has any increased effusion on the right and diuretic therapy is considered, I  would consider using ethacrynic acid.  Check a basic metabolic panel and CBC today.

## 2010-05-26 NOTE — Assessment & Plan Note (Signed)
Doing well postoperatively.  I think she's mainly deconditioned.  She is currently on Coumadin which is followed by our Coumadin clinic.

## 2010-05-26 NOTE — Assessment & Plan Note (Signed)
As noted, she sees the surgeon today.  Will last her chest x-ray results consent as well.

## 2010-05-26 NOTE — Progress Notes (Signed)
History of Present Illness: Primary Cardiologist:  Dr. Marca Ancona  Heather Crawford is a 65 y.o. female with a History of rheumatic heart disease.  Echocardiogram generated 12 demonstrated an EF of 45-50% with grade 1 diastolic dysfunction, moderate aortic stenosis with a mean gradient of 27 mm of mercury and mild to moderate mitral regurgitation.  Cardiac catheterization done 04/04/10 demonstrated single vessel CAD with 40% ostial, mid 60% and distal 95% RCA stenosis.  Intervention could not be performed and she was referred to cardiothoracic surgery.  The recommendation was to proceed with single vessel CABG.  It was also decided to ahead and proceed with aortic valve replacement.  This was performed on March 30 by Dr. Dorris Fetch.  She had a right internal mammary artery grafted to her RCA and she had a 21 mm magna he is very valve replacement.  Her postoperative course was complicated by a small to moderate right pleural effusion.  She was treated with diuretics.  Short-term Coumadin was started in the hospital and she returns today for follow up.  She is still somewhat short of breath.  Describes class II to 2B symptoms.  She sleeps on 3 pillows.  She denies PND or edema.  Her chest is still significantly sore.  She notes a temperature of 99.8 at times.  She denies any cough.  She denies any erythema or discharge no chest wound.  She is walking 30 minutes a day now.  She's had several folks listen to her chest and she thinks that she still has an effusion.  She did have a rash break out on her abdomen and legs.  This is improving.  It was somewhat pruritic.  She denies any new medications other than Coumadin.  She was on Lasix for about a week after she was discharged from the hospital.  She sees the surgeon this afternoon.  Past Medical History  Diagnosis Date  . History of rheumatic fever   . 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 Aortic stenosis     a.  echo 1/12 w/mildly dilated LV, EF 45-50%  w/paradoxical septal motion consistent w/ LBBB, moderate aortic stenosis w/mean gradient 27 mmHg, trivial AI, mild to moderate MR w/calcified mitral valve;   b. s/p AVR with 21 mm Magna Ease valve  . LBBB (left bundle branch block)   . Allergic rhinitis, cause unspecified   . Esophageal reflux   . Diverticulitis of colon (without mention of hemorrhage)   . Coronary artery disease     adenosine myoview 2/12 w/EF 56%, ISCHEMIA W/SCAR IN THE MID TO APICAL ANTERIO WALL, SEPTAL WALL, AND APEX. LHC 3/12 W/EF 55%, 40% OSTIAL RCA, 60% MID RCA, 95% DISTAL RC A, INTERVENTION  ATTEMPTED BUT UNABLE TO ADEQUADATELY SEAT  CATHETER.  Marland Kitchen Hyperlipidemia   . Hx of hysterectomy   . Chronic back pain     H/O BACK SURGERY  . Glucose intolerance (impaired glucose tolerance)     A1C 6.3 (4/12)  . CAD (coronary artery disease)     a. s/p CABG 4/12: RIMA-RCA    Current Outpatient Prescriptions  Medication Sig Dispense Refill  . albuterol-ipratropium (COMBIVENT) 18-103 MCG/ACT inhaler Inhale 2 puffs into the lungs every 6 (six) hours as needed.        Marland Kitchen aspirin 81 MG EC tablet Take 81 mg by mouth daily.        . carvedilol (COREG) 6.25 MG tablet Take 6.25 mg by mouth 2 (two) times daily with  a meal.        . loratadine (CLARITIN) 10 MG tablet Take 10 mg by mouth daily.        . meclizine (ANTIVERT) 25 MG tablet Take 12.5 mg by mouth as needed.        . nitroGLYCERIN (NITROSTAT) 0.4 MG SL tablet Place 0.4 mg under the tongue every 5 (five) minutes as needed.        . ranitidine (ZANTAC) 150 MG tablet Take 150 mg by mouth daily.        . simvastatin (ZOCOR) 40 MG tablet Take 40 mg by mouth at bedtime.        Marland Kitchen warfarin (COUMADIN) 5 MG tablet Take 5 mg by mouth. Take as directed per Coumadin clinic       . DISCONTD: isosorbide mononitrate (IMDUR) 30 MG 24 hr tablet Take 30 mg by mouth daily.        Marland Kitchen DISCONTD: omega-3 acid ethyl esters (LOVAZA) 1 G capsule Take 1 g by mouth 3 (three) times daily.           Allergies  Allergen Reactions  . Amoxicillin   . Neomycin   . Sulfamethoxazole W/Trimethoprim     Vital Signs: BP 158/70  Pulse 86  Resp 18  Ht 5\' 3"  (1.6 m)  Wt 168 lb 6.4 oz (76.386 kg)  BMI 29.83 kg/m2  PHYSICAL EXAM: Well nourished, well developed, in no acute distress HEENT: normal Neck: no JVD Cardiac:  normal S1, S2; RRR; no murmur Chest: median sternotomy healing well without erythema or discharge Lungs:  Decreased breath sounds at right base, no egophony, no rales, otherwise clear Abd: soft, nontender, no hepatomegaly Ext: no edema Skin: warm and dry, diffuse (small) macular rash LLE Neuro:  CNs 2-12 intact, no focal abnormalities noted  EKG:  Sinus rhythm, heart rate 86, left bundle branch block  ASSESSMENT AND PLAN:

## 2010-05-26 NOTE — Assessment & Plan Note (Signed)
Elevated today.  She has not taken her medications yet.  Her blood pressures at home with home health nursing at optimal.  Continue current therapy.

## 2010-05-27 ENCOUNTER — Telehealth: Payer: Self-pay | Admitting: Cardiology

## 2010-05-27 NOTE — Telephone Encounter (Signed)
Okey Regal called pt today

## 2010-05-27 NOTE — Assessment & Plan Note (Signed)
OFFICE VISIT  Heather, Crawford DOB:  [birth year]                                        May 26, 2010 CHART #:  [id number]  REASON FOR OFFICE VISIT:  Routine follow up status post CABG and AVR.  HISTORY OF PRESENT ILLNESS:  This is a 65 year old Caucasian female who is found to have severe AS and single-vessel coronary artery disease. She underwent a CABG x1 (RIMA to RCA and aortic valve replacement [facility] Ease size 21 mm) by Dr. Dorris Fetch on May 01, 2010.  The patient had a fairly uneventful hospital course stay, was discharged in stable condition on May 07, 2010.  The patient's complaints include she had a previous rash under both breasts and on her abdomen, as well as her lower legs (has now resolved), occasional shortness of breath and fatigue.  She is walking up to 30 minutes daily.  The patient denies any chest pain, fever, or chills.  On physical examination, in general, this is a pleasant 64-year Caucasian female who is accompanied by her husband.  She is in no acute distress.  She is alert, oriented, and cooperative.  Latest vital signs are as follows, BP 143/75, heart rate 84, respirations 18, O2 sat 97% on room air.  Cardiovascular:  Regular rate and rhythm, S1 and S2 without murmurs, gallops, or rubs.  Pulmonary:  Slightly decreased at the right base.  No rales or rhonchi.  Left lung clear.  Abdomen is soft, nontender.  Bowel sounds present.  Extremities:  No lower extremity edema.  Sternal wound is clean, dry, and well healed.  Chest x-ray done today shows no pneumothorax, decrease in the right pleural effusion, previous small left pleural effusion resolved, mild atelectasis in right lower lobe.  IMPRESSION AND PLAN: 1. The patient was seen by Dr. Alford Highland physician's assistant earlier     today.  The INR was found to be 3.6, the patient is not going to be     taking Coumadin this evening, we will give verbal instructions by     their  office regarding remainder of her Coumadin scheduled for the     week.  She will have another PT and INR obtain the following     Monday.  There have been no other changes to her discharge     medications. 2. Provided she is not taking any narcotics (the patient currently     taking Tylenol p.o. p.r.n. for pain.  She may begin driving short     distances, i.e. 30 minutes or less during the day, she may     gradually increase her frequency and duration as tolerates. 3. The patient is encouraged to participate in cardiac rehab which she     will most likely begin next week. 4. The patient's right pleural effusion is decreased on today's chest     x-ray and she has no lower extremity edema.  She has finished her     course of Lasix.  The patient was instructed if she develops any     increasing shortness of breath or lower extremity edema, she is to     contact our office as she may need another diuretic for a few more     days.  Finally, regarding the patient's previous rash, she has an     allergy to SULFA, she was not  placed on any new medications     recently and as previously stated, she has finished her Lasix and     potassium.  I instructed the patient if she develops a rash again     that she should see her medical doctor.  Also the patient inquires     about the use of allergy medication as the Claritin she has been     taking for the last several years appears not to be working.  She     was instructed she may take something over-the-counter i.e. Zyrtec,     but probably best to avoid medications with pseudoephedrine.  Heather Fudge, PA  DZ/MEDQ  D:  05/26/2010  T:  05/27/2010  Job:  [id number]  cc:   Salvatore Decent. Dorris Fetch, M.D. Rosalyn Gess Norins, MD

## 2010-06-02 ENCOUNTER — Ambulatory Visit: Payer: Self-pay | Admitting: Cardiology

## 2010-06-02 ENCOUNTER — Encounter: Payer: Self-pay | Admitting: Cardiology

## 2010-06-09 ENCOUNTER — Ambulatory Visit: Payer: Self-pay | Admitting: Internal Medicine

## 2010-06-09 ENCOUNTER — Encounter: Payer: Self-pay | Admitting: Cardiology

## 2010-06-09 LAB — PROTIME-INR: INR: 2.9 — AB (ref <=1.1)

## 2010-06-09 NOTE — Discharge Summary (Signed)
Heather Crawford, Heather Crawford            ACCOUNT NO.:  [id number]  MEDICAL RECORD NO.:  [id number]           PATIENT TYPE:  I  LOCATION:  2017                         FACILITY:  [facility]  PHYSICIAN:  Salvatore Decent. Dorris Fetch, M.D.DATE OF BIRTH:  07/01/1945  DATE OF ADMISSION:  05/01/2010 DATE OF DISCHARGE:  05/07/2010                              DISCHARGE SUMMARY   PRIMARY ADMITTING DIAGNOSIS:  Dyspnea.  DISCHARGE/ADDITIONAL DIAGNOSES: 1. Single-vessel coronary artery disease. 2. Severe aortic stenosis. 3. Exertional angina. 4. Postoperative acute blood loss anemia. 5. Small-to-moderate right pleural effusion. 6. Hyperlipidemia. 7. History of rheumatic fever. 8. History of left bundle-branch block. 9. Gastroesophageal reflux disease. 10.Allergic rhinitis. 11.History of chronic low back pain. 12.Diverticulitis.  PROCEDURES PERFORMED: 1. Coronary artery bypass grafting x1 (right internal mammary artery     to the right coronary artery). 2. Aortic valve replacement with 21-mm Magna Ease.  HISTORY OF PRESENT ILLNESS:  The patient is a 65 year old female with a known history of aortic stenosis which has been followed by [facility].  She recently began to have exertional chest and neck tightness and dyspnea.  She underwent a Myoview study which suggested ischemia in the anterior septal wall.  She underwent an echocardiogram on February 12, 2010, which showed an EF of 40-50%.  There was aortic stenosis with a valve area estimated between 0.8 and 0.94 cm2 with some moderate annular calcification and mild-to-moderate mitral regurgitation.  Because of these findings, she then underwent cardiac catheterization which showed a 90% distal right coronary artery stenosis.  Angioplasty was attempted, but due to technical issues, Dr. Riley Kill was unable to safely proceed with angioplasty.  A Cardiac Surgery consult was then obtained for consideration of combination aortic valve replacement  and coronary artery bypass grafting.  The patient was seen by Dr. Dorris Fetch and her films were reviewed.  He agreed with the need for CABG and aortic valve replacement.  Also, he discussed the possibility of needing a mitral annuloplasty depending on the results of an intraoperative TEE.  All risks, benefits and alternatives of surgery were explained to the patient and family and she agreed to proceed.  Because she had been previously loaded with Plavix, she was discharged from the hospital in order to allow Plavix washout. She also was seen in the interim with a dentist for full dental evaluation prior to proceeding with valve replacement.  She was seen again in the office on April 16, 2010, and was deemed ready for surgery at that time.  HOSPITAL COURSE:  Heather Crawford was admitted to [facility] on May 01, 2010, and was taken to the operating room where she underwent CABG x1 and aortic valve replacement by Dr. Dorris Fetch.  Please see previously dictated operative report for complete details of surgery.  She tolerated the procedure well and was transferred to the SICU in stable condition.  She was able to be extubated shortly after surgery.  She was hemodynamically stable and doing well on postop day #1.  She remained in the unit for further observation and her chest tubes and hemodynamic monitoring lines were removed.  She did have a small to right moderate pleural  effusion and some mild volume overload and was started on Lasix. She also was treated with aggressive pulmonary toilet measures for a right lower lobe atelectasis.  By postop day #4, she was ready for transfer to the step-down unit.  She was started on Coumadin short-term for her aortic valve.  She was also restarted on a beta-blocker which she has been on preoperatively and was titrated back up to her home dose.  Overall, she has done very well postoperatively.  She is ambulating in the halls with cardiac rehab phase 1 as  well as independently.  She has been weaned from supplemental oxygen and is maintaining sats of greater than 90% on room air.  She has been afebrile and her vital signs have been stable.  She has been diuresed back down to her preoperative weight with only minimal lower extremity edema on physical exam.  She continues to have a stable small to moderate right pleural effusion on chest x-ray.  Her most recent labs show a sodium of 136, potassium 4.2, BUN 12, creatinine 0.70, white count 7.2, hemoglobin 9.6, hematocrit 29.9, platelets 262.  She has had mildly elevated blood sugars perioperatively and was treated with sliding scale insulin.  Her preoperative hemoglobin A1c was 6.3 and was felt that this can be followed as an outpatient.  Her INR is trending upward and on postop day #6 is 1.45 with a PT of 17.8.  Her incisions are all healing well.  She is tolerating a regular diet and is having normal bowel and bladder function.  She has been evaluated by Dr. Dorris Fetch in the morning of postop day #6 and at this time she was felt ready for discharge home.  DISCHARGE MEDICATIONS: 1. Enteric-coated aspirin 81 mg daily. 2. Lasix 40 mg daily x7 days. 3. Mucinex 600 mg b.i.d. 4. Combivent metered-dose inhaler 2 puffs q.i.d. p.r.n. 5. Oxycodone IR 5-10 mg q.3-4 h. p.r.n. for pain. 6. Potassium 20 mEq daily x 7 days. 7. Coumadin 5 mg daily or as directed by the Coumadin Clinic. 8. Claritin 10 mg daily. 9. Coreg 6.25 mg b.i.d. 10.Meclizine 12.5 mg t.i.d. p.r.n. for dizziness. 11.Zantac 150 mg daily. 12.Zocor 40 mg at bedtime.  DISCHARGE INSTRUCTIONS:  She was asked to refrain from driving, heavy lifting or strenuous activity.  She may continue ambulating daily and using her incentive spirometer.  She may shower daily and clean her incisions with soap and water.  She will continue low-fat, low-sodium diet.  DISCHARGE FOLLOWUP:  Home Health nurse has been arranged to assist  with postdischarge care and will draw PT and INR on the morning of May 08, 2010, with results to be called to the [facility] for monitoring of her anticoagulation.  She will then follow up with Dr. Shirlee Latch on May 26, 2010, at 9:30 a.m.  She will also see Dr. Sunday Corn PA on May 26, 2010, at 1:30 with a chest x-ray prior to this appointment from [facility].  In the interim, if she experiences any problems or has questions, she is asked to contact our office immediately.     Coral Ceo, P.A.   ______________________________ Salvatore Decent Dorris Fetch, M.D.    GC/MEDQ  D:  05/07/2010  T:  05/07/2010  Job:  [id number]  cc:   Marca Ancona, MD Rosalyn Gess. Norins, MD  Electronically Signed by Coral Ceo P.A. on 05/23/2010 02:22:13 PM Electronically Signed by Charlett Lango M.D. on 06/09/2010 54:62:70 PM

## 2010-06-18 ENCOUNTER — Ambulatory Visit (INDEPENDENT_AMBULATORY_CARE_PROVIDER_SITE_OTHER): Payer: Medicare Other | Admitting: Cardiology

## 2010-06-18 ENCOUNTER — Encounter: Payer: Self-pay | Admitting: Cardiology

## 2010-06-18 ENCOUNTER — Ambulatory Visit (INDEPENDENT_AMBULATORY_CARE_PROVIDER_SITE_OTHER): Payer: Medicare Other | Admitting: *Deleted

## 2010-06-18 DIAGNOSIS — I359 Nonrheumatic aortic valve disorder, unspecified: Secondary | ICD-10-CM

## 2010-06-18 DIAGNOSIS — I2510 Atherosclerotic heart disease of native coronary artery without angina pectoris: Secondary | ICD-10-CM

## 2010-06-18 DIAGNOSIS — R0602 Shortness of breath: Secondary | ICD-10-CM

## 2010-06-18 DIAGNOSIS — I38 Endocarditis, valve unspecified: Secondary | ICD-10-CM

## 2010-06-18 DIAGNOSIS — I25810 Atherosclerosis of coronary artery bypass graft(s) without angina pectoris: Secondary | ICD-10-CM

## 2010-06-18 DIAGNOSIS — Z954 Presence of other heart-valve replacement: Secondary | ICD-10-CM

## 2010-06-18 LAB — BASIC METABOLIC PANEL
BUN: 14 mg/dL (ref 6–23)
Chloride: 106 mEq/L (ref 96–112)
Potassium: 5 mEq/L (ref 3.5–5.1)

## 2010-06-18 LAB — LIPID PANEL
Cholesterol: 129 mg/dL (ref 0–200)
HDL: 36.5 mg/dL — ABNORMAL LOW (ref >39.00)
LDL Cholesterol: 53 mg/dL (ref 0–99)
VLDL: 40 mg/dL (ref 0.0–40.0)

## 2010-06-18 MED ORDER — FUROSEMIDE 20 MG PO TABS: 20.0000 mg | ORAL_TABLET | Freq: Every day | ORAL | Status: DC

## 2010-06-18 MED ORDER — POTASSIUM CHLORIDE CRYS ER 10 MEQ PO TBCR: 10.0000 meq | EXTENDED_RELEASE_TABLET | Freq: Two times a day (BID) | ORAL | Status: DC

## 2010-06-18 NOTE — Patient Instructions (Addendum)
Start Lasix (furosemide) 20mg  daily.  Start KCL(potassium) 10 mEq daily.  Lab today--BMP/BNP/Lipid profile/Liver profile 414.05  424.1  Lab in 2 weeks--BMP 414.05  424.1  Schedule an appointment  for an echocardiogram.  Schedule an appointment to see Dr Shirlee Latch in 3 months.

## 2010-06-18 NOTE — Assessment & Plan Note (Signed)
Status post RIMA-RCA in 3/12.  No further chest pain.  She will continue ASA, Coreg, and statin.  I will have her get lipids/LFTs with goal LDL < 70.

## 2010-06-18 NOTE — Progress Notes (Signed)
PCP: Dr. Debby Bud  65 yo with history of rheumatic fever and rheumatic valve disease presents for followup.  Patient had an episode as a child that is very consistent with rheumatic fever and has had a heart murmur since that time.  She has been followed by a cardiologist in [facility] in the past for aortic stenosis and mitral regurgitation.   Echo recently showed moderate aortic stenosis and mild to moderate mitral regurgitation.  EF was mildly decreased (45-50%) with abnormal septal motion consistent with LBBB.   Patient reported dyspnea with moderate exertion such as after walking 100 feet or walking up an incline. She also would get a tightness/pain in her neck when doing the same activities.  No chest pain.  No neck tightness at rest.  We did an adenosine myoview with EF 56% and evidence for ischemia and scar in the mid to apical anterior wall, septal wall, and apex.    I did a left heart cath.  This showed significant RCA disease: 40% ostial, 60% mid, 95% distal.  Dr. Riley Kill attempted to intervene on the RCA but was unable to adequately seat the catheter for intervention.  Given the large territory supplied by the RCA, CVTS was consulted regarding CABG + AVR given moderate AS.  Patient had AVR with tissue valve and RIMA-RCA in 3/12.  Postoperatively, she developed pleural effusions which are resolving.  She does not want to do cardiac rehab due to cost, but she is trying to exercise on her own.  She walks about a mile a day slowly and uses her treadmill and stationary bike.  She is still getting short of breath if she moves fast or goes up an incline, but she is better than prior to surgery.  No chest pain.  Last CXR in 4/12 showed resolved left pleural effusion and improved right pleural effusion.   ECG: NSR, LBBB  Labs (1/12): TGs 596, HDL 40, LDL 171, HCT 39, K 4.6, creatinine  Labs (2/12): K 4.8, creatinine 0.6 Labs (4/12): K 3.9, creatinine 0.8, HCT 32.8  Allergies (verified):  1)  !  Bactrim 2)  ! Amoxicillin 3)  ! Neomycin 4)  ! * Adhesive Tape  Past Medical History: 1. RHEUMATIC FEVER, HX OF (ICD-V12.59) 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 HEART VALVE DISEASE (ICD-424.90): Echo (1/12) with mildly dilated LV, EF 45-50% with paradoxical septal motion consistent with LBBB, moderate aortic stenosis with mean gradient 27 mmHg, trivial AI, mild to moderate mitral regurgitation with calcified mitral valve.  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  4. LBBB 5. ALLERGIC RHINITIS (ICD-477.9) 6. GERD (ICD-530.81) 7. Hx of DIVERTICULITIS, ACUTE (ICD-562.11) 8. Chronic low back pain with history of back surgery 9. Appendectomy 10. Hysterectomy 11. Hyperlipidemia 12. CAD: Adenosine myoview (2/12) with EF 56%, ischemia with scar in the mid to apical anterior wall, septal wall, and apex.  LHC (3/12) with EF 55%, 40% ostial RCA, 60% mid RCA, 95% distal RCA.  Intervention attempted but unable to adequately seat catheter.  Patient had CABG-AVR in 3/12 with RIMA-RCA.               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   Current Outpatient Prescriptions  Medication Sig Dispense Refill  . albuterol-ipratropium (COMBIVENT) 18-103 MCG/ACT inhaler Inhale 2 puffs into the lungs every 6 (six) hours as needed.        Marland Kitchen aspirin 81 MG EC tablet Take 81 mg by mouth daily.        . carvedilol (COREG) 6.25 MG tablet Take 6.25 mg by mouth 2 (two) times daily with a meal.        . cetirizine (ZYRTEC) 10 MG tablet Take 10 mg by mouth daily.        . meclizine  (ANTIVERT) 25 MG tablet Take 12.5 mg by mouth as needed.        . simvastatin (ZOCOR) 40 MG tablet Take 40 mg by mouth at bedtime.        Marland Kitchen warfarin (COUMADIN) 5 MG tablet Take 5 mg by mouth. Take as directed per Coumadin clinic       . DISCONTD: nitroGLYCERIN (NITROSTAT) 0.4 MG SL tablet Place 0.4 mg under the tongue every 5 (five) minutes as needed.        . furosemide (LASIX) 20 MG tablet Take 1 tablet (20 mg total) by mouth daily.  30 tablet  6  . potassium chloride (K-DUR,KLOR-CON) 10 MEQ tablet Take 1 tablet (10 mEq total) by mouth 2 (two) times daily.  30 tablet  6  . ranitidine (ZANTAC) 150 MG tablet Take 150 mg by mouth daily.        Marland Kitchen DISCONTD: furosemide (LASIX) 20 MG tablet Take 1 tablet (20 mg total) by mouth daily.  90 tablet  3  . DISCONTD: loratadine (CLARITIN) 10 MG tablet Take 10 mg by mouth daily.        Marland Kitchen DISCONTD: potassium chloride (K-DUR,KLOR-CON) 10 MEQ tablet Take 1 tablet (10 mEq total) by mouth 2 (two) times daily.  90 tablet  3    BP 132/60  Pulse 65  Resp 18  Ht 5\' 3"  (1.6 m)  Wt 169 lb 12.8 oz (77.021 kg)  BMI 30.08 kg/m2 General: NAD Neck: No JVD, no thyromegaly or thyroid nodule.  Lungs: Slightly decreased breath sounds right base.  CV: Nondisplaced PMI.  Heart regular S1/S2, no S3/S4, 2/6 early SEM RUSB.  No peripheral edema.  No carotid bruit.  Normal pedal pulses.  Abdomen: Soft, nontender, no hepatosplenomegaly, no distention.  Neurologic: Alert and oriented x 3.  Psych: Normal affect. Extremities: No clubbing or cyanosis.

## 2010-06-18 NOTE — Assessment & Plan Note (Addendum)
Patient had moderate AS likely due to rheumatic heart disease.  She had AVR with bioprosthetic valve in 3/12 since she was also having CABG.  Will get echo for baseline assessment of bioprosthetic aortic valve.  She should be able to stop coumadin soon and continue on ASA 81 mg daily alone.  She will call Dr. Sunday Corn office to make sure that it is ok to stop coumadin now.

## 2010-06-18 NOTE — Assessment & Plan Note (Addendum)
Patient continues to have some dyspnea on exertion.  She is not particularly volume overloaded on exam, but I will let her try a low dose of Lasix at 20 mg daily + KCl 10 mEq daily.  I will get a BNP today.  BMET in 2 weeks on Lasix.

## 2010-06-24 ENCOUNTER — Other Ambulatory Visit: Payer: Self-pay | Admitting: *Deleted

## 2010-06-24 ENCOUNTER — Other Ambulatory Visit: Payer: Self-pay | Admitting: Cardiology

## 2010-07-01 ENCOUNTER — Ambulatory Visit (HOSPITAL_COMMUNITY): Payer: Medicare Other | Attending: Internal Medicine | Admitting: Radiology

## 2010-07-01 ENCOUNTER — Other Ambulatory Visit (INDEPENDENT_AMBULATORY_CARE_PROVIDER_SITE_OTHER): Payer: Medicare Other | Admitting: *Deleted

## 2010-07-01 DIAGNOSIS — I359 Nonrheumatic aortic valve disorder, unspecified: Secondary | ICD-10-CM

## 2010-07-01 DIAGNOSIS — I25810 Atherosclerosis of coronary artery bypass graft(s) without angina pectoris: Secondary | ICD-10-CM

## 2010-07-01 DIAGNOSIS — I079 Rheumatic tricuspid valve disease, unspecified: Secondary | ICD-10-CM | POA: Insufficient documentation

## 2010-07-01 DIAGNOSIS — Z954 Presence of other heart-valve replacement: Secondary | ICD-10-CM | POA: Insufficient documentation

## 2010-07-01 DIAGNOSIS — I059 Rheumatic mitral valve disease, unspecified: Secondary | ICD-10-CM | POA: Insufficient documentation

## 2010-07-01 LAB — BASIC METABOLIC PANEL
BUN: 13 mg/dL (ref 6–23)
Calcium: 8.7 mg/dL (ref 8.4–10.5)
Creatinine, Ser: 0.7 mg/dL (ref 0.4–1.2)
GFR: 87.29 mL/min (ref >60.00)
Glucose, Bld: 112 mg/dL — ABNORMAL HIGH (ref 70–99)
Sodium: 142 mEq/L (ref 135–145)

## 2010-07-11 ENCOUNTER — Telehealth: Payer: Self-pay | Admitting: *Deleted

## 2010-07-11 NOTE — Telephone Encounter (Signed)
Results of lab and

## 2010-07-11 NOTE — Telephone Encounter (Signed)
Results of lab to pt--nt

## 2010-07-11 NOTE — Telephone Encounter (Signed)
Pt rtn call to get results pls call mobile number she will be in and out

## 2010-07-11 NOTE — Telephone Encounter (Signed)
Results of L/L,BMET,andBNP GIVEN AGAIN TO PT--NT

## 2010-09-24 ENCOUNTER — Ambulatory Visit: Payer: Medicare Other | Admitting: Cardiology

## 2010-10-20 ENCOUNTER — Encounter: Payer: Self-pay | Admitting: Cardiology

## 2010-10-20 ENCOUNTER — Ambulatory Visit (INDEPENDENT_AMBULATORY_CARE_PROVIDER_SITE_OTHER): Payer: Medicare Other | Admitting: Cardiology

## 2010-10-20 DIAGNOSIS — I5032 Chronic diastolic (congestive) heart failure: Secondary | ICD-10-CM

## 2010-10-20 DIAGNOSIS — Z951 Presence of aortocoronary bypass graft: Secondary | ICD-10-CM

## 2010-10-20 DIAGNOSIS — I2510 Atherosclerotic heart disease of native coronary artery without angina pectoris: Secondary | ICD-10-CM

## 2010-10-20 DIAGNOSIS — Z8679 Personal history of other diseases of the circulatory system: Secondary | ICD-10-CM

## 2010-10-20 DIAGNOSIS — I5020 Unspecified systolic (congestive) heart failure: Secondary | ICD-10-CM | POA: Insufficient documentation

## 2010-10-20 DIAGNOSIS — R0609 Other forms of dyspnea: Secondary | ICD-10-CM

## 2010-10-20 DIAGNOSIS — E7800 Pure hypercholesterolemia, unspecified: Secondary | ICD-10-CM

## 2010-10-20 DIAGNOSIS — Z9889 Other specified postprocedural states: Secondary | ICD-10-CM

## 2010-10-20 DIAGNOSIS — I359 Nonrheumatic aortic valve disorder, unspecified: Secondary | ICD-10-CM

## 2010-10-20 LAB — BASIC METABOLIC PANEL
BUN: 12 mg/dL (ref 6–23)
Creatinine, Ser: 0.7 mg/dL (ref 0.4–1.2)
GFR: 91.66 mL/min (ref >60.00)
Glucose, Bld: 132 mg/dL — ABNORMAL HIGH (ref 70–99)

## 2010-10-20 LAB — BRAIN NATRIURETIC PEPTIDE: Pro B Natriuretic peptide (BNP): 87 pg/mL (ref 0.0–100.0)

## 2010-10-20 MED ORDER — SIMVASTATIN 40 MG PO TABS: 40.0000 mg | ORAL_TABLET | Freq: Every day | ORAL | Status: DC

## 2010-10-20 MED ORDER — CARVEDILOL 6.25 MG PO TABS: 6.2500 mg | ORAL_TABLET | Freq: Two times a day (BID) | ORAL | Status: DC

## 2010-10-20 NOTE — Assessment & Plan Note (Signed)
NYHA class II. She is not very active.  I suggested that she try to walk 20-30 minutes several times a week.  She will continue current dose of Lasix as she is close to euvolemic on exam.  I will check BMET/BNP today.

## 2010-10-20 NOTE — Progress Notes (Signed)
PCP: Dr. Debby Crawford  65 yo with history of rheumatic fever and rheumatic valve disease presents for followup.  Patient had an episode as a child that is very consistent with rheumatic fever and has had a heart murmur since that time.  She has been followed by a cardiologist in [facility] in the past for aortic stenosis and mitral regurgitation.   Echo recently showed moderate aortic stenosis and mild to moderate mitral regurgitation.  EF was mildly decreased (45-50%) with abnormal septal motion consistent with LBBB.   Patient reported dyspnea with moderate exertion such as after walking 100 feet or walking up an incline. She also would get a tightness/pain in her neck when doing the same activities.  No chest pain.  No neck tightness at rest.  We did an adenosine myoview with EF 56% and evidence for ischemia and scar in the mid to apical anterior wall, septal wall, and apex.    I did a left heart cath.  This showed significant RCA disease: 40% ostial, 60% mid, 95% distal.  Dr. Riley Crawford attempted to intervene on the RCA but was unable to adequately seat the catheter for intervention.  Given the large territory supplied by the RCA, CVTS was consulted regarding CABG + AVR given moderate AS.  Patient had AVR with tissue valve and RIMA-RCA in 3/12.  She has been slowly improving since that time.  Echo in 5/12 showed EF 55-60% and a well-seated bioprosthetic valve.  She still has some soreness at the sternotomy site.  No further episodes of exertional neck pain/tightness.  She does a fair amount of gardening and harvesting vegetables.  No formal exercise.  Mild dyspnea with heavy exertion.    ECG: NSR, LBBB  Labs (1/12): TGs 596, HDL 40, LDL 171, HCT 39, K 4.6, creatinine  Labs (2/12): K 4.8, creatinine 0.6 Labs (4/12): K 3.9, creatinine 0.8, HCT 32.8 Labs (5/12): K 3.9, creatinine 0.7, LDL 53, HDL 37  Allergies (verified):  1)  ! Bactrim 2)  ! Amoxicillin 3)  ! Neomycin 4)  ! * Adhesive Tape  Past Medical  History: 1. RHEUMATIC FEVER, HX OF (ICD-V12.59) 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 HEART VALVE DISEASE (ICD-424.90): Echo (1/12) with mildly dilated LV, EF 45-50% with paradoxical septal motion consistent with LBBB, moderate aortic stenosis with mean gradient 27 mmHg, trivial AI, mild to moderate mitral regurgitation with calcified mitral valve.  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 Echo (5/12): EF 55-60%, basal inferior hypokinesis, moderate diastolic dysfunction, normally functioning bioprosthetic aortic valve with mean gradient 13 mmHg, normal RV.  4. LBBB 5. ALLERGIC RHINITIS (ICD-477.9) 6. GERD (ICD-530.81) 7. Hx of DIVERTICULITIS, ACUTE (ICD-562.11) 8. Chronic low back pain with history of back surgery 9. Appendectomy 10. Hysterectomy 11. Hyperlipidemia 12. CAD: Adenosine myoview (2/12) with EF 56%, ischemia with scar in the mid to apical anterior wall, septal wall, and apex.  LHC (3/12) with EF 55%, 40% ostial RCA, 60% mid RCA, 95% distal RCA.  Intervention attempted but unable to adequately seat catheter.  Patient had CABG-AVR in 3/12 with RIMA-RCA.    13. Carotid dopplers (3/12): no significant stenosis.             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   Current Outpatient Prescriptions  Medication Sig Dispense Refill  . albuterol-ipratropium (COMBIVENT) 18-103 MCG/ACT inhaler Inhale 2 puffs into the lungs every 6 (six) hours as needed.        Marland Kitchen aspirin 81 MG EC tablet Take 81 mg by mouth daily.        . carvedilol (COREG) 6.25 MG tablet  Take 1 tablet (6.25 mg total) by mouth 2 (two) times daily with a meal.  180 tablet  3  . cetirizine (ZYRTEC) 10 MG tablet Take 10 mg by mouth daily.        . furosemide (LASIX) 20 MG tablet Take 1 tablet (20 mg total) by mouth daily.  30 tablet  6  . meclizine (ANTIVERT) 25 MG tablet Take 12.5 mg by mouth as needed.        . ranitidine (ZANTAC) 150 MG tablet Take 150 mg by mouth as needed.       . simvastatin (ZOCOR) 40 MG tablet Take 1 tablet (40 mg total) by mouth at bedtime.  90 tablet  3    BP 132/64  Pulse 63  Ht 5\' 3"  (1.6 m)  Wt 177 lb (80.287 kg)  BMI 31.35 kg/m2 General: NAD Neck: No JVD, no thyromegaly or thyroid nodule.  Lungs: Slightly decreased breath sounds right base.  CV: Nondisplaced PMI.  Heart regular S1/S2, no S3/S4, 2/6 early SEM RUSB.  No peripheral edema.  L carotid bruit.  Normal pedal pulses.  Abdomen: Soft, nontender, no hepatosplenomegaly, no distention.  Neurologic: Alert and oriented x 3.  Psych: Normal affect. Extremities: No clubbing or cyanosis.

## 2010-10-20 NOTE — Patient Instructions (Signed)
Your physician recommends that [facility] lab work today for a bmp and a bnp.  We will call you with your results.  Your physician recommends that you return for lab work in: November for fasting cholesterol and liver enzymes.  Your physician recommends that you schedule a follow-up appointment in: 6 months with Dr. Shirlee Latch

## 2010-10-20 NOTE — Assessment & Plan Note (Signed)
Patient had moderate AS likely due to rheumatic heart disease.  She had AVR with bioprosthetic valve in 3/12 since she was also having CABG.  Valve was well-seated on recent baseline echo.

## 2010-10-20 NOTE — Assessment & Plan Note (Signed)
Status post RIMA-RCA in 3/12.  No further chest pain.  She will continue ASA, Coreg, and statin.  I will have her get lipids/LFTs in 11/12 with goal LDL < 70.

## 2010-12-05 ENCOUNTER — Other Ambulatory Visit: Payer: Medicare Other | Admitting: *Deleted

## 2010-12-08 ENCOUNTER — Other Ambulatory Visit (INDEPENDENT_AMBULATORY_CARE_PROVIDER_SITE_OTHER): Payer: Medicare Other | Admitting: *Deleted

## 2010-12-08 ENCOUNTER — Other Ambulatory Visit: Payer: Self-pay | Admitting: Cardiology

## 2010-12-08 DIAGNOSIS — E7800 Pure hypercholesterolemia, unspecified: Secondary | ICD-10-CM

## 2010-12-08 LAB — HEPATIC FUNCTION PANEL
ALT: 16 U/L (ref 0–35)
Alkaline Phosphatase: 53 U/L (ref 39–117)
Bilirubin, Direct: 0 mg/dL (ref 0.0–0.3)
Total Bilirubin: 0.5 mg/dL (ref 0.3–1.2)
Total Protein: 7.1 g/dL (ref 6.0–8.3)

## 2010-12-08 LAB — LIPID PANEL: HDL: 47.1 mg/dL (ref >39.00)

## 2010-12-18 ENCOUNTER — Telehealth: Payer: Self-pay | Admitting: Cardiology

## 2010-12-18 DIAGNOSIS — I25810 Atherosclerosis of coronary artery bypass graft(s) without angina pectoris: Secondary | ICD-10-CM

## 2010-12-18 DIAGNOSIS — E7800 Pure hypercholesterolemia, unspecified: Secondary | ICD-10-CM

## 2010-12-18 MED ORDER — ATORVASTATIN CALCIUM 40 MG PO TABS: 40.0000 mg | ORAL_TABLET | Freq: Every day | ORAL | Status: DC

## 2010-12-18 NOTE — Telephone Encounter (Signed)
Pt calling to speak to [facility]. Please return pt call to discuss further.

## 2010-12-18 NOTE — Telephone Encounter (Signed)
Notes Recorded by Jacqlyn Krauss, RN on 12/18/2010 at 5:09 PM Discussed with pt. She will stop simvastatin and atorvastatin 40mg  daily. She will return for lipid/liver profile 02/13/11. Notes Recorded by Jacqlyn Krauss, RN on 12/18/2010 at 2:46 PM LMTCB Notes Recorded by Marca Ancona, MD on 12/14/2010 at 8:53 PM LDL too high, would stop simvastatin and start atorvastatin 40 mg daily. Lipids/LFTs in 2 months

## 2011-01-19 ENCOUNTER — Telehealth: Payer: Self-pay | Admitting: Cardiology

## 2011-01-19 ENCOUNTER — Other Ambulatory Visit: Payer: Self-pay

## 2011-01-19 NOTE — Telephone Encounter (Signed)
New msg Pt has sinus infection. She wants to know what med she can take. Please call her back

## 2011-01-19 NOTE — Telephone Encounter (Signed)
For alllergy vs viral URI: otc non-sedating antihistamine, e.g. Allegra and sudafed 30 mg tid.

## 2011-01-19 NOTE — Telephone Encounter (Signed)
Pt states she has nasal congestion that is clear and feels pressure in her ears. She denies a temperature. I recommended pt try saline nasal spray /coricidin but not sudafed. Recommended pt contact Dr Debby Bud for further recommendations given history of AVR. Pt agreed.

## 2011-01-19 NOTE — Telephone Encounter (Signed)
Pt called c/o sinus drainage x 3 weeks. Clear in color and now with fullness of both ears. Pt does not believe she has an infection but is requesting advisement from MD.

## 2011-01-20 MED ORDER — FLUTICASONE PROPIONATE 50 MCG/ACT NA SUSP: 1.0000 | Freq: Every day | NASAL | Status: DC

## 2011-01-20 NOTE — Telephone Encounter (Signed)
Continue zyrtec. Rx for flonase nasal spray - 1 spray per nostril daily.

## 2011-01-20 NOTE — Telephone Encounter (Signed)
No answer, no voicemail.

## 2011-01-20 NOTE — Telephone Encounter (Signed)
Pt states that she is already on an antihistamine - Zyrtec. Pt also says that Dr Alford Highland office told her not to take Sudafed. I offered pt an OV because she said she has been having sxs x 3 weeks but declined unless MD insists. Please advise.

## 2011-01-20 NOTE — Telephone Encounter (Signed)
Pt informed or Rx/pharmacy 

## 2011-02-13 ENCOUNTER — Other Ambulatory Visit: Payer: Medicare Other | Admitting: *Deleted

## 2011-02-20 ENCOUNTER — Ambulatory Visit: Payer: Self-pay | Admitting: *Deleted

## 2011-02-20 DIAGNOSIS — I359 Nonrheumatic aortic valve disorder, unspecified: Secondary | ICD-10-CM

## 2011-02-20 DIAGNOSIS — Z954 Presence of other heart-valve replacement: Secondary | ICD-10-CM

## 2011-03-05 ENCOUNTER — Other Ambulatory Visit (INDEPENDENT_AMBULATORY_CARE_PROVIDER_SITE_OTHER): Payer: Medicare Other | Admitting: *Deleted

## 2011-03-05 DIAGNOSIS — I25810 Atherosclerosis of coronary artery bypass graft(s) without angina pectoris: Secondary | ICD-10-CM

## 2011-03-05 DIAGNOSIS — E7800 Pure hypercholesterolemia, unspecified: Secondary | ICD-10-CM

## 2011-03-05 LAB — HEPATIC FUNCTION PANEL
ALT: 20 U/L (ref 0–35)
AST: 18 U/L (ref 0–37)
Alkaline Phosphatase: 55 U/L (ref 39–117)
Bilirubin, Direct: 0 mg/dL (ref 0.0–0.3)
Total Bilirubin: 0.6 mg/dL (ref 0.3–1.2)

## 2011-03-05 LAB — LIPID PANEL
Cholesterol: 162 mg/dL (ref 0–200)
Total CHOL/HDL Ratio: 4
VLDL: 48 mg/dL — ABNORMAL HIGH (ref 0.0–40.0)

## 2011-03-05 LAB — LDL CHOLESTEROL, DIRECT: Direct LDL: 89.3 mg/dL

## 2011-04-07 ENCOUNTER — Encounter: Payer: Self-pay | Admitting: Cardiology

## 2011-04-07 ENCOUNTER — Ambulatory Visit (INDEPENDENT_AMBULATORY_CARE_PROVIDER_SITE_OTHER): Payer: Medicare Other | Admitting: Cardiology

## 2011-04-07 DIAGNOSIS — I509 Heart failure, unspecified: Secondary | ICD-10-CM

## 2011-04-07 DIAGNOSIS — I359 Nonrheumatic aortic valve disorder, unspecified: Secondary | ICD-10-CM

## 2011-04-07 DIAGNOSIS — I2510 Atherosclerotic heart disease of native coronary artery without angina pectoris: Secondary | ICD-10-CM

## 2011-04-07 DIAGNOSIS — E785 Hyperlipidemia, unspecified: Secondary | ICD-10-CM

## 2011-04-07 DIAGNOSIS — I25810 Atherosclerosis of coronary artery bypass graft(s) without angina pectoris: Secondary | ICD-10-CM

## 2011-04-07 DIAGNOSIS — I5032 Chronic diastolic (congestive) heart failure: Secondary | ICD-10-CM

## 2011-04-07 MED ORDER — ATORVASTATIN CALCIUM 40 MG PO TABS: 40.0000 mg | ORAL_TABLET | Freq: Every day | ORAL | Status: DC

## 2011-04-07 MED ORDER — MECLIZINE HCL 12.5 MG PO TABS: 12.5000 mg | ORAL_TABLET | Freq: Three times a day (TID) | ORAL | Status: DC | PRN

## 2011-04-07 NOTE — Patient Instructions (Signed)
Your physician wants you to follow-up in: 6 months with Dr Shirlee Latch. (September 2013) You will receive a reminder letter in the mail two months in advance. If you don't receive a letter, please call our office to schedule the follow-up appointment.   Your physician recommends that you return for a FASTING lipid profile/liver profile in 6 months when you see Dr Shirlee Latch.

## 2011-04-07 NOTE — Assessment & Plan Note (Signed)
NYHA class II. She is not very active.  I suggested that she try to ride her stationary bike 20-30 minutes several times a week. She is using Lasix only rarely as needed.  She appears close to euvolemic on exam today.

## 2011-04-07 NOTE — Progress Notes (Signed)
PCP: Dr. Debby Bud  66 yo with history of rheumatic fever and rheumatic valve disease presents for followup.  Patient had an episode as a child that is very consistent with rheumatic fever and has had a heart murmur since that time.  She had been followed by a cardiologist in [facility] in the past for aortic stenosis and mitral regurgitation.   Echo in 1/12 showed moderate aortic stenosis and mild to moderate mitral regurgitation.  EF was mildly decreased (45-50%) with abnormal septal motion consistent with LBBB.   Patient reported dyspnea with moderate exertion such as after walking 100 feet or walking up an incline. She also would get a tightness/pain in her neck when doing the same activities.  No chest pain.  No neck tightness at rest.  We did an adenosine myoview in 2/12 with EF 56% and evidence for ischemia and scar in the mid to apical anterior wall, septal wall, and apex.  Left heart cath was done in 3/12 showing significant RCA disease: 40% ostial, 60% mid, 95% distal.  Dr. Riley Kill attempted to intervene on the RCA but was unable to adequately seat the catheter for intervention.  Given the large territory supplied by the RCA, CVTS was consulted regarding CABG + AVR given moderate AS.  Patient had AVR with tissue valve and RIMA-RCA in 3/12. Post-op echo in 5/12 showed EF 55-60% and a well-seated bioprosthetic valve.   She has been stable since I last saw her.  She is having a fair amount of back pain with walking.  She does not get short of breath walking on flat ground but has mild dyspnea walking up a flight of steps.  No chest pain.  She is under a lot of stress keeping her mother-in-law and feels depressed.  Weight is up 1 lb since last appointment.   ECG: NSR, LBBB  Labs (1/12): TGs 596, HDL 40, LDL 171, HCT 39, K 4.6, creatinine  Labs (2/12): K 4.8, creatinine 0.6 Labs (4/12): K 3.9, creatinine 0.8, HCT 32.8 Labs (5/12): K 3.9, creatinine 0.7, LDL 53, HDL 37 Labs (9/12): K 4, creatinine  0.7 Labs (1/13): LDL 89, HDL 42  Allergies (verified):  1)  ! Bactrim 2)  ! Amoxicillin 3)  ! Neomycin 4)  ! * Adhesive Tape  Past Medical History: 1. RHEUMATIC FEVER, HX OF (ICD-V12.59) 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 HEART VALVE DISEASE (ICD-424.90): Echo (1/12) with mildly dilated LV, EF 45-50% with paradoxical septal motion consistent with LBBB, moderate aortic stenosis with mean gradient 27 mmHg, trivial AI, mild to moderate mitral regurgitation with calcified mitral valve.  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 Echo (5/12): EF 55-60%, basal inferior hypokinesis, moderate diastolic dysfunction, normally functioning bioprosthetic aortic valve with mean gradient 13 mmHg, normal RV.  4. LBBB 5. ALLERGIC RHINITIS (ICD-477.9) 6. GERD (ICD-530.81) 7. Hx of DIVERTICULITIS, ACUTE (ICD-562.11) 8. Chronic low back pain with history of back surgery 9. Appendectomy 10. Hysterectomy 11. Hyperlipidemia 12. CAD: Adenosine myoview (2/12) with EF 56%, ischemia with scar in the mid to apical anterior wall, septal wall, and apex.  LHC (3/12) with EF 55%, 40% ostial RCA, 60% mid RCA, 95% distal RCA.  Intervention attempted but unable to adequately seat catheter.  Patient had CABG-AVR in 3/12 with RIMA-RCA.    13. Carotid dopplers (3/12): no significant stenosis.             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 Now full time care giver Lives in [place], grew up in [place]  Family History: Father - deceased @ 43: CAD/MI, CABG at 26 Mother - deceased @ 25: Uterine cancer, DM, HTN, hypothyroid, breast cancer Sister - breast cancer Aunt - colon cancer  Review of Systems        All systems reviewed and negative except as per HPI.   Current Outpatient Prescriptions  Medication Sig Dispense Refill  . albuterol-ipratropium (COMBIVENT) 18-103 MCG/ACT inhaler Inhale 2 puffs  into the lungs every 6 (six) hours as needed.        Marland Kitchen aspirin 325 MG tablet Take 325 mg by mouth daily.      . carvedilol (COREG) 6.25 MG tablet Take 1 tablet (6.25 mg total) by mouth 2 (two) times daily with a meal.  180 tablet  3  . fluticasone (FLONASE) 50 MCG/ACT nasal spray Place 1 spray into the nose daily.  16 g  6  . furosemide (LASIX) 20 MG tablet Pt only takes tablet if she needs it      . ranitidine (ZANTAC) 150 MG tablet Take 150 mg by mouth as needed.       Marland Kitchen DISCONTD: atorvastatin (LIPITOR) 40 MG tablet Take 1 tablet (40 mg total) by mouth daily.  30 tablet  3  . DISCONTD: meclizine (ANTIVERT) 25 MG tablet Take 12.5 mg by mouth as needed.        Marland Kitchen atorvastatin (LIPITOR) 40 MG tablet Take 1 tablet (40 mg total) by mouth daily.  90 tablet  3  . meclizine (ANTIVERT) 12.5 MG tablet Take 1 tablet (12.5 mg total) by mouth 3 (three) times daily as needed.  10 tablet  0  . DISCONTD: furosemide (LASIX) 20 MG tablet Take 1 tablet (20 mg total) by mouth daily.  30 tablet  6    BP 136/70  Pulse 64  Resp 18  Ht 5\' 3"  (1.6 m)  Wt 178 lb (80.74 kg)  BMI 31.53 kg/m2 General: NAD, overweight Neck: No JVD, no thyromegaly or thyroid nodule.  Lungs: Slightly decreased breath sounds right base.  CV: Nondisplaced PMI.  Heart regular S1/S2, no S3/S4, 2/6 early SEM RUSB.  No peripheral edema. Soft bilateral carotid bruit (may be radiation of aortic area murmur).  Normal pedal pulses.  Abdomen: Soft, nontender, no hepatosplenomegaly, no distention.  Neurologic: Alert and oriented x 3.  Psych: Normal affect. Extremities: No clubbing or cyanosis.

## 2011-04-07 NOTE — Assessment & Plan Note (Signed)
Status post RIMA-RCA in 3/12.  No further chest pain.  She will continue ASA, Coreg, and statin.

## 2011-04-07 NOTE — Assessment & Plan Note (Signed)
Patient had moderate AS likely due to rheumatic heart disease.  She had AVR with bioprosthetic valve in 3/12 since she was also having CABG.  Valve was well-seated on recent baseline echo.  Symptoms are stable.

## 2011-04-07 NOTE — Assessment & Plan Note (Signed)
Goal LDL < 70.  LDL was a little high in 1/13.  I have encouraged her to work on diet.  If LDL is still above goal with repeat lipids in 6 months, will increase atorvastatin to 80 mg daily.   I asked Heather Crawford to talk to Dr. Debby Bud about her anxiety and depression when she sees him again.  She may benefit from SSRI but seems rather situational related to her mother-in-law.

## 2011-04-11 ENCOUNTER — Other Ambulatory Visit: Payer: Self-pay | Admitting: Cardiology

## 2011-05-26 ENCOUNTER — Telehealth: Payer: Self-pay | Admitting: *Deleted

## 2011-05-26 NOTE — Telephone Encounter (Signed)
Patient informed; will call back if feels OV is necessary/SLS

## 2011-05-26 NOTE — Telephone Encounter (Signed)
Patient c/o Allergy issues: head congestion, ear pressure, watery eyes. Pt reports she has been using flonase & claritin w/o relief. Requesting new Rx, but states she had heart Sx for aorta valve replacement 1 yr ago & is limited as to what she can take.

## 2011-05-26 NOTE — Telephone Encounter (Signed)
For allergic rhinnitus claritin and flonase are a good combination. For congestion she may safely use low dose decongestant: sudafed 30 mg twice or three times a day. If she continues to have problems an OV will be appropriate.

## 2011-07-14 ENCOUNTER — Other Ambulatory Visit: Payer: Self-pay | Admitting: *Deleted

## 2011-07-14 MED ORDER — FLUTICASONE PROPIONATE 50 MCG/ACT NA SUSP: 1.0000 | Freq: Every day | NASAL | Status: DC

## 2011-07-14 NOTE — Telephone Encounter (Signed)
Rx sent to [facility] FLuticasone

## 2011-08-12 ENCOUNTER — Other Ambulatory Visit (INDEPENDENT_AMBULATORY_CARE_PROVIDER_SITE_OTHER): Payer: Medicare Other

## 2011-08-12 ENCOUNTER — Encounter: Payer: Self-pay | Admitting: Internal Medicine

## 2011-08-12 ENCOUNTER — Ambulatory Visit (INDEPENDENT_AMBULATORY_CARE_PROVIDER_SITE_OTHER): Payer: Medicare Other | Admitting: Internal Medicine

## 2011-08-12 ENCOUNTER — Ambulatory Visit (INDEPENDENT_AMBULATORY_CARE_PROVIDER_SITE_OTHER)
Admission: RE | Admit: 2011-08-12 | Discharge: 2011-08-12 | Disposition: A | Discharge status: Home / Self Care | Payer: Medicare Other | Source: Ambulatory Visit

## 2011-08-12 VITALS — BP 160/80 | HR 80 | Temp 97.5°F | Resp 16 | Ht 63.0 in | Wt 177.0 lb

## 2011-08-12 DIAGNOSIS — M899 Disorder of bone, unspecified: Secondary | ICD-10-CM

## 2011-08-12 DIAGNOSIS — E785 Hyperlipidemia, unspecified: Secondary | ICD-10-CM

## 2011-08-12 DIAGNOSIS — I2510 Atherosclerotic heart disease of native coronary artery without angina pectoris: Secondary | ICD-10-CM

## 2011-08-12 DIAGNOSIS — M8580 Other specified disorders of bone density and structure, unspecified site: Secondary | ICD-10-CM

## 2011-08-12 DIAGNOSIS — Z91018 Allergy to other foods: Secondary | ICD-10-CM

## 2011-08-12 DIAGNOSIS — I509 Heart failure, unspecified: Secondary | ICD-10-CM

## 2011-08-12 DIAGNOSIS — I5032 Chronic diastolic (congestive) heart failure: Secondary | ICD-10-CM

## 2011-08-12 DIAGNOSIS — Z1239 Encounter for other screening for malignant neoplasm of breast: Secondary | ICD-10-CM

## 2011-08-12 DIAGNOSIS — J309 Allergic rhinitis, unspecified: Secondary | ICD-10-CM

## 2011-08-12 DIAGNOSIS — Z0000 Encounter for general adult medical examination without abnormal findings: Secondary | ICD-10-CM

## 2011-08-12 DIAGNOSIS — I10 Essential (primary) hypertension: Secondary | ICD-10-CM

## 2011-08-12 DIAGNOSIS — K219 Gastro-esophageal reflux disease without esophagitis: Secondary | ICD-10-CM

## 2011-08-12 DIAGNOSIS — I38 Endocarditis, valve unspecified: Secondary | ICD-10-CM

## 2011-08-12 DIAGNOSIS — Z23 Encounter for immunization: Secondary | ICD-10-CM

## 2011-08-12 LAB — HEPATIC FUNCTION PANEL
ALT: 21 U/L (ref 0–35)
AST: 21 U/L (ref 0–37)
Bilirubin, Direct: 0.1 mg/dL (ref 0.0–0.3)
Total Bilirubin: 0.7 mg/dL (ref 0.3–1.2)

## 2011-08-12 LAB — COMPREHENSIVE METABOLIC PANEL
Alkaline Phosphatase: 51 U/L (ref 39–117)
BUN: 14 mg/dL (ref 6–23)
Creatinine, Ser: 0.6 mg/dL (ref 0.4–1.2)
GFR: 99.87 mL/min (ref >60.00)
Glucose, Bld: 132 mg/dL — ABNORMAL HIGH (ref 70–99)
Total Bilirubin: 0.7 mg/dL (ref 0.3–1.2)

## 2011-08-12 LAB — LIPID PANEL
Cholesterol: 182 mg/dL (ref 0–200)
HDL: 42.3 mg/dL (ref >39.00)
Triglycerides: 230 mg/dL — ABNORMAL HIGH (ref 0.0–149.0)

## 2011-08-12 LAB — LDL CHOLESTEROL, DIRECT: Direct LDL: 104 mg/dL

## 2011-08-12 NOTE — Assessment & Plan Note (Signed)
Ok to resume nasal steroid spray but she is instructed to gargle and rinse after use. Will investigate the use of sudafed post prosthetic valve replacement

## 2011-08-12 NOTE — Progress Notes (Signed)
Subjective:    Patient ID: Analyah Crawford, female    DOB: [birth year], 66 y.o.   MRN: [id number]  HPI Heather Crawford is here for annual Medicare wellness examination and management of other chronic and acute problems.  She reports two episodes of swelling and redness of the mouth and lipids, with burning. Eating made it worse. She had a thick white exudate to tongue and red buccal membranes. She had not been on antibiotics. She was first seen April 29th was diagnosed with thrush and treated with nystatin and a z-pak. She states that her symptoms improved. 2nd episode in June seen at urgent care and diagnosed with flonase related thrush. She was treated with nystatin and magic mouthwash w/ xylocain.  She has been told that she cannot take sudafed due to possible heart infection.   She c/o back pain and has several back surgeries. She is not exercising daily.   The risk factors are reflected in the social history.  The roster of all physicians providing medical care to patient - is listed in the Snapshot section of the chart.  Activities of daily living:  The patient is 100% inedpendent in all ADLs: dressing, toileting, feeding as well as independent mobility  Home safety : The patient has smoke detectors in the home. They wear seatbelts while in care.  firearms are present in the home, kept in a safe fashion. There is no violence in the home.   There is no risks for hepatitis, STDs or HIV. There is no   history of blood transfusion. They have no travel history to infectious disease endemic areas of the world.  The patient has seen their dentist in the last six month. They have seen their eye doctor in the last year. They deny any hearing difficulty and have not had audiologic testing in the last year.  They do not  have excessive sun exposure. Discussed the need for sun protection: hats, long sleeves and use of sunscreen if there is significant sun exposure.   Diet: the importance of a healthy  diet is discussed. They do have a healthy  diet.  The patient has no regular exercise program.  The benefits of regular aerobic exercise were discussed.  Depression screen: there are no signs or vegative symptoms of depression- irritability, change in appetite, anhedonia, sadness/tearfullness.  Cognitive assessment: the patient manages all their financial and personal affairs and is actively engaged.   The following portions of the patient's history were reviewed and updated as appropriate: allergies, current medications, past family history, past medical history,  past surgical history, past social history  and problem list.  Vision, hearing, body mass index were assessed and reviewed.   During the course of the visit the patient was educated and counseled about appropriate screening and preventive services including : fall prevention , diabetes screening, nutrition counseling, colorectal cancer screening, and recommended immunizations.  Past Medical History  Diagnosis Date  . History of rheumatic fever   . 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 Aortic stenosis     a.  echo 1/12 w/mildly dilated LV, EF 45-50% w/paradoxical septal motion consistent w/ LBBB, moderate aortic stenosis w/mean gradient 27 mmHg, trivial AI, mild to moderate MR w/calcified mitral valve;   b. s/p AVR with 21 mm Magna Ease valve  . LBBB (left bundle branch block)   . Allergic rhinitis, cause unspecified   . Esophageal reflux   . Diverticulitis of colon (without mention of hemorrhage)   .  Coronary artery disease     adenosine myoview 2/12 w/EF 56%, ISCHEMIA W/SCAR IN THE MID TO APICAL ANTERIO WALL, SEPTAL WALL, AND APEX. LHC 3/12 W/EF 55%, 40% OSTIAL RCA, 60% MID RCA, 95% DISTAL RC A, INTERVENTION  ATTEMPTED BUT UNABLE TO ADEQUADATELY SEAT  CATHETER.  Marland Kitchen Hyperlipidemia   . Hx of hysterectomy   . Chronic back pain     H/O BACK SURGERY  . Glucose intolerance (impaired glucose tolerance)     A1C 6.3 (4/12)  . CAD  (coronary artery disease)     a. s/p CABG 4/12: RIMA-RCA   Past Surgical History  Procedure Date  . Appendectomy 1973  . Tonsillectomy     1955  . Lumbar disc surgery     DR. HUSSEY[place]   Family History  Problem Relation Age of Onset  . Hypertension Mother   . Cancer Mother     BREAST  . Hypothyroidism Mother   . Diabetes type II Mother   . Heart attack Father     CAD/MI, CABG @ 69  . Cancer Sister     BREAST  . Cancer      COLON CANCER, AUNT   History   Social History  . Marital Status: Married    Spouse Name: N/A    Number of Children: 2  . Years of Education: N/A   Occupational History  .      [employer], [employer], CERTIFIED AS SPECIAL ED TEACHER   Social History Main Topics  . Smoking status: Former Smoker    Types: Cigarettes    Quit date: 02/03/2000  . Smokeless tobacco: Not on file  . Alcohol Use: No  . Drug Use: No  . Sexually Active: Not on file   Other Topics Concern  . 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    Current Outpatient Prescriptions on File Prior to Visit  Medication Sig Dispense Refill  . aspirin 325 MG tablet Take 325 mg by mouth daily.      Marland Kitchen atorvastatin (LIPITOR) 40 MG tablet Take 1 tablet (40 mg total) by mouth daily.  90 tablet  3  . carvedilol (COREG) 6.25 MG tablet Take 1 tablet (6.25 mg total) by mouth 2 (two) times daily with a meal.  180 tablet  3  . furosemide (LASIX) 20 MG tablet TAKE 1 TABLET (20 MG TOTAL) BY MOUTH DAILY.  30 tablet  6  . albuterol-ipratropium (COMBIVENT) 18-103 MCG/ACT inhaler Inhale 2 puffs into the lungs every 6 (six) hours as needed.        . fluticasone (FLONASE) 50 MCG/ACT nasal spray Place 1 spray into the nose daily.  16 g  6  . furosemide (LASIX) 20 MG tablet Pt only takes tablet if she needs it      . meclizine (ANTIVERT) 12.5 MG tablet Take 1 tablet (12.5 mg  total) by mouth 3 (three) times daily as needed.  10 tablet  0  . ranitidine (ZANTAC) 150 MG tablet Take 150 mg by mouth as needed.           Review of Systems System review is negative for any constitutional, cardiac, pulmonary, GI or neuro symptoms or complaints other than as described in the HPI.     Objective:   Physical Exam Filed Vitals:   08/12/11 0903  BP: 160/80  Pulse: 80  Temp: 97.5 F (36.4  C)  Resp: 16   Wt Readings from Last 3 Encounters:  08/12/11 177 lb (80.287 kg)  04/07/11 178 lb (80.74 kg)  10/20/10 177 lb (80.287 kg)    Gen'l: well nourished, well developed white woman in no distress HEENT - Reading/AT, EACs/TMs normal, oropharynx with native dentition in good condition, small shallow ulceration left side of tongue, no buccal or palatal lesions, posterior pharynx clear, mucous membranes moist. C&S clear, PERRLA, fundi - normal Neck - supple, no thyromegaly Nodes- positive submental node left, no nodes  cervical, supraclavicular regions Chest - no deformity, no CVAT Lungs - cleat without rales, wheezes. No increased work of breathing Breast - - Skin normal, nipples w/o discharge, no fixed mass or lesion, minor fibrocystic type nodules in both breasts,  no axillary adenopathy. Cardiovascular - regular rate and rhythm, quiet precordium, very soft  Murmur best at RSB, no  rubs or gallops, 2+ radial, DP and 1+ PT pulses. Very soft bilateral carotid bruits Abdomen - BS+ x 4, no HSM, no guarding or rebound or tenderness Pelvic - deferred s/p Hysterectomy Rectal - deferred  Extremities - no clubbing, cyanosis, edema or deformity.  Neuro - A&O x 3, CN II-XII normal, motor strength normal and equal, DTRs 2+ and symmetrical biceps, radial, and patellar tendons. Cerebellar - no tremor, no rigidity, fluid movement and normal gait. Derm - Head, neck, back, abdomen and extremities without suspicious lesions  Lab Results  Component Value Date   WBC 6.6 05/26/2010   HGB 11.2*  05/26/2010   HCT 32.8* 05/26/2010   PLT 360.0 05/26/2010   GLUCOSE 132* 08/12/2011   CHOL 182 08/12/2011   TRIG 230.0* 08/12/2011   HDL 42.30 08/12/2011   LDLDIRECT 104.0 08/12/2011   LDLCALC 53 06/18/2010        ALT 21 08/12/2011   AST 21 08/12/2011        NA 140 08/12/2011   K 4.6 08/12/2011   CL 104 08/12/2011   CREATININE 0.6 08/12/2011   BUN 14 08/12/2011   CO2 27 08/12/2011   TSH 3.81 02/07/2010   INR 3.1 06/18/2010   HGBA1C  Value: 6.3                                                                       04/29/2010           Assessment & Plan:

## 2011-08-13 ENCOUNTER — Telehealth: Payer: Self-pay | Admitting: *Deleted

## 2011-08-13 DIAGNOSIS — Z0000 Encounter for general adult medical examination without abnormal findings: Secondary | ICD-10-CM | POA: Insufficient documentation

## 2011-08-13 NOTE — Assessment & Plan Note (Signed)
BP Readings from Last 3 Encounters:  08/12/11 160/80  04/07/11 136/70  10/20/10 132/64   Previously good control - today's reading is too high.  Plan -  continue present medications  Monitor BP - report back a series of readings.  Will adjust medications if needed.

## 2011-08-13 NOTE — Assessment & Plan Note (Signed)
Interval history without acute events. She does remain very active and is asymptomatic. Physical exam is normal. Lab results are normal except for cholesterol not being at goal. She is current with colorectal cancer screening and is due for follow-up mammogram. Immunizations are current except she is due for shingles vaccine.  In summary- a nice woman who appears medically stable. She will follow-up with Drs. Shirlee Latch and [place] as directed. Staff will assist in scheduling Mammogram. A DXA scan will be scheduled. She is adamantly encouraged to exercise daily: cardio and back stretching for her low grade chronic back pain. She will return for follow lab in either 1 month or 3 months based on her medication adherence. Next general physical exam in 1 year.

## 2011-08-13 NOTE — Assessment & Plan Note (Signed)
Stable and doing well. No symptoms: no syncope or pre-syncope, no DOE. Had last 2 D echo May '12 with stable prosthetic valve.  Plan - Defer to cardiology re: follow-up echo.

## 2011-08-13 NOTE — Assessment & Plan Note (Signed)
No c/o GERD symptoms at today's visit. She is taking Zantac as needed.  Plan  recommend taking Zantac 150 mg AM and at Bedtime as needed.

## 2011-08-13 NOTE — Telephone Encounter (Signed)
Patient notified of lab results. States has been on vacation and that might be why was elevated. States does take her medication. Will repeat in 3 months

## 2011-08-13 NOTE — Assessment & Plan Note (Signed)
Patient is 16 months s/p CABG w/ RIMA-RCA and doing well. No chest pain and no limitation in activities.  Plan Cardiac risk reduction: see above re: lipid management and HTN management  Adamantly encouraged her to do aerobic exercise 30 min/day 3 days a week at a minimum and preferrably 5/wk  Follow-up with Drs. Shirlee Latch and [place] as instructed.

## 2011-08-13 NOTE — Assessment & Plan Note (Signed)
LDL is not at goal of 80 or less and was much better at last lab while on the same dose of medication.  Plan Contact patient: was she taking her medication daily?  If taking meds daily - 3 month trial at same dose with better dietary adherence. If not taking meds as directed will resume treatment and recheck in 1 month.

## 2011-08-13 NOTE — Assessment & Plan Note (Signed)
Stable with no evidence of decompensation at today's exam.  Plan Continue present medications.

## 2011-08-13 NOTE — Telephone Encounter (Signed)
Message copied by Elnora Morrison on Thu Aug 13, 2011 10:57 AM ------      Message from: Illene Regulus E      Created: Thu Aug 13, 2011  4:10 AM       LDL is 146 up from 43 last lab. Is she taking her medication daily?      If yes - work on diet and exericse with repeat lab 3 months      If no - start taking daily with repeat lab in 1 month            Thanks

## 2011-08-14 LAB — RETICULIN ANTIBODIES, IGA W TITER: Reticulin Ab, IgA: NEGATIVE

## 2011-08-14 LAB — GLIADIN ANTIBODIES, SERUM: Gliadin IgA: 4.7 U/mL (ref <20)

## 2011-08-16 ENCOUNTER — Encounter: Payer: Self-pay | Admitting: Internal Medicine

## 2011-08-30 ENCOUNTER — Encounter: Payer: Self-pay | Admitting: Internal Medicine

## 2011-09-17 ENCOUNTER — Other Ambulatory Visit: Payer: Self-pay | Admitting: Radiology

## 2011-09-23 ENCOUNTER — Encounter: Payer: Self-pay | Admitting: Internal Medicine

## 2011-10-04 HISTORY — PX: BREAST BIOPSY: SHX20

## 2011-10-08 ENCOUNTER — Encounter (INDEPENDENT_AMBULATORY_CARE_PROVIDER_SITE_OTHER): Payer: Self-pay | Admitting: Surgery

## 2011-10-08 ENCOUNTER — Encounter (INDEPENDENT_AMBULATORY_CARE_PROVIDER_SITE_OTHER): Payer: Self-pay | Admitting: General Surgery

## 2011-10-08 ENCOUNTER — Ambulatory Visit (INDEPENDENT_AMBULATORY_CARE_PROVIDER_SITE_OTHER): Payer: Medicare Other | Admitting: Surgery

## 2011-10-08 VITALS — BP 136/76 | HR 76 | Temp 97.8°F | Resp 18 | Ht 62.0 in | Wt 178.4 lb

## 2011-10-08 DIAGNOSIS — N62 Hypertrophy of breast: Secondary | ICD-10-CM

## 2011-10-08 DIAGNOSIS — N6099 Unspecified benign mammary dysplasia of unspecified breast: Secondary | ICD-10-CM | POA: Insufficient documentation

## 2011-10-08 NOTE — Progress Notes (Signed)
Patient ID: Heather Crawford, female   DOB: [birth year], 66 y.o.   MRN: [id number]  Chief Complaint  Patient presents with  . Breast Problem    new pt- eval atypical lobular hyperplasia    HPI Heather Crawford is a 66 y.o. female.   HPI She is referred by Dr. Derinda Late after recent mammograms demonstrated an abnormality in the right breast. She has since had a biopsy of this area showing atypical cells. She is referred for surgical evaluation. She has had no previous surgery on her breast. She denies current nipple discharge. She does have a significant family history of her mother dying of breast cancer as well as her sister having breast cancer as well. Past Medical History  Diagnosis Date  . History of rheumatic fever   . 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 Aortic stenosis     a.  echo 1/12 w/mildly dilated LV, EF 45-50% w/paradoxical septal motion consistent w/ LBBB, moderate aortic stenosis w/mean gradient 27 mmHg, trivial AI, mild to moderate MR w/calcified mitral valve;   b. s/p AVR with 21 mm Magna Ease valve  . LBBB (left bundle branch block)   . Allergic rhinitis, cause unspecified   . Esophageal reflux   . Diverticulitis of colon (without mention of hemorrhage)   . Coronary artery disease     adenosine myoview 2/12 w/EF 56%, ISCHEMIA W/SCAR IN THE MID TO APICAL ANTERIO WALL, SEPTAL WALL, AND APEX. LHC 3/12 W/EF 55%, 40% OSTIAL RCA, 60% MID RCA, 95% DISTAL RC A, INTERVENTION  ATTEMPTED BUT UNABLE TO ADEQUADATELY SEAT  CATHETER.  Marland Kitchen Hyperlipidemia   . Hx of hysterectomy   . Chronic back pain     H/O BACK SURGERY  . Glucose intolerance (impaired glucose tolerance)     A1C 6.3 (4/12)  . CAD (coronary artery disease)     a. s/p CABG 4/12: RIMA-RCA  . Heart murmur   . Myocardial infarction     Past Surgical History  Procedure Date  . Appendectomy 1973  . Tonsillectomy     1955  . Lumbar disc surgery     [facility] [place]  . Cardiac catheterization   . Abdominal hysterectomy    . Dilation and curettage of uterus   . Laparoscopic lysis intestinal adhesions   . Endometrial ablation     Family History  Problem Relation Age of Onset  . Hypertension Mother   . Cancer Mother     BREAST  . Hypothyroidism Mother   . Diabetes type II Mother   . Heart disease Mother   . Heart attack Father     CAD/MI, CABG @ 42  . Cancer Sister     BREAST  . Cancer      COLON CANCER, AUNT  . Cancer Maternal Aunt     breast  . Cancer Paternal Uncle     colon    Social History History  Substance Use Topics  . Smoking status: Former Smoker    Types: Cigarettes    Quit date: 02/03/2000  . Smokeless tobacco: Not on file  . Alcohol Use: No    Allergies  Allergen Reactions  . Tape     breakouts  . Amoxicillin   . Neomycin   . Sulfamethoxazole W-Trimethoprim     Current Outpatient Prescriptions  Medication Sig Dispense Refill  . albuterol-ipratropium (COMBIVENT) 18-103 MCG/ACT inhaler Inhale 2 puffs into the lungs every 6 (six) hours as needed.        Marland Kitchen  aspirin 325 MG tablet Take 325 mg by mouth daily.      Marland Kitchen atorvastatin (LIPITOR) 40 MG tablet Take 1 tablet (40 mg total) by mouth daily.  90 tablet  3  . carvedilol (COREG) 6.25 MG tablet Take 1 tablet (6.25 mg total) by mouth 2 (two) times daily with a meal.  180 tablet  3  . fluticasone (FLONASE) 50 MCG/ACT nasal spray Place 1 spray into the nose daily.  16 g  6  . furosemide (LASIX) 20 MG tablet TAKE 1 TABLET (20 MG TOTAL) BY MOUTH DAILY.  30 tablet  6  . ranitidine (ZANTAC) 150 MG tablet Take 150 mg by mouth as needed.       . furosemide (LASIX) 20 MG tablet Pt only takes tablet if she needs it      . meclizine (ANTIVERT) 12.5 MG tablet Take 1 tablet (12.5 mg total) by mouth 3 (three) times daily as needed.  10 tablet  0    Review of Systems Review of Systems  Constitutional: Negative for fever, chills and unexpected weight change.  HENT: Negative for hearing loss, congestion, sore throat, trouble swallowing  and voice change.   Eyes: Negative for visual disturbance.  Respiratory: Negative for cough and wheezing.   Cardiovascular: Negative for chest pain, palpitations and leg swelling.  Gastrointestinal: Negative for nausea, vomiting, abdominal pain, diarrhea, constipation, blood in stool, abdominal distention and anal bleeding.  Genitourinary: Negative for hematuria, vaginal bleeding and difficulty urinating.  Musculoskeletal: Negative for arthralgias.  Skin: Negative for rash and wound.  Neurological: Negative for seizures, syncope and headaches.  Hematological: Negative for adenopathy. Does not bruise/bleed easily.  Psychiatric/Behavioral: Negative for confusion.    Blood pressure 136/76, pulse 76, temperature 97.8 F (36.6 C), temperature source Temporal, resp. rate 18, height 5\' 2"  (1.575 m), weight 178 lb 6.4 oz (80.922 kg).  Physical Exam Physical Exam  Constitutional: She is oriented to person, place, and time. She appears well-developed and well-nourished. No distress.  HENT:  Head: Normocephalic and atraumatic.  Right Ear: External ear normal.  Left Ear: External ear normal.  Nose: Nose normal.  Mouth/Throat: Oropharynx is clear and moist. No oropharyngeal exudate.  Eyes: Conjunctivae and EOM are normal. Pupils are equal, round, and reactive to light. No scleral icterus.  Neck: Normal range of motion. Neck supple. No tracheal deviation present.  Cardiovascular: Normal rate, regular rhythm, normal heart sounds and intact distal pulses.   No murmur heard. Pulmonary/Chest: Effort normal and breath sounds normal. No respiratory distress. She has no wheezes.  Lymphadenopathy:    She has no cervical adenopathy.  Neurological: She is alert and oriented to person, place, and time.  Skin: Skin is warm and dry. No rash noted. No erythema.  Psychiatric: Her behavior is normal. Judgment normal.  breasts: There are no palpable masses in either breast. Areola are normal are normal. The  needle biopsy site in the right breast is well healed.  There is no axillary lymphadenopathy  Data Reviewed I have reviewed the patient's pathology report and mammograms. This shows atypical lobular hyperplasia of the right breast  Assessment    Atypical lobular hyperplasia of the right breast    Plan    Needle localized right breast lumpectomy was recommended for histologic evaluation to rule out malignancy. I discussed this with her and her husband. I discussed the risks of surgery which includes bleeding, infection, need for further surgery if malignancy is present, et Karie Soda. She understands and wishes to proceed. Likelihood  of success is good       Katlyn Muldrew A 10/08/2011, 2:57 PM

## 2011-10-14 ENCOUNTER — Ambulatory Visit (INDEPENDENT_AMBULATORY_CARE_PROVIDER_SITE_OTHER): Payer: Medicare Other | Admitting: Cardiology

## 2011-10-14 ENCOUNTER — Encounter: Payer: Self-pay | Admitting: Cardiology

## 2011-10-14 VITALS — BP 122/72 | HR 67 | Ht 62.0 in | Wt 177.0 lb

## 2011-10-14 DIAGNOSIS — I5032 Chronic diastolic (congestive) heart failure: Secondary | ICD-10-CM

## 2011-10-14 DIAGNOSIS — I359 Nonrheumatic aortic valve disorder, unspecified: Secondary | ICD-10-CM

## 2011-10-14 DIAGNOSIS — E785 Hyperlipidemia, unspecified: Secondary | ICD-10-CM

## 2011-10-14 DIAGNOSIS — R0989 Other specified symptoms and signs involving the circulatory and respiratory systems: Secondary | ICD-10-CM

## 2011-10-14 DIAGNOSIS — I25810 Atherosclerosis of coronary artery bypass graft(s) without angina pectoris: Secondary | ICD-10-CM

## 2011-10-14 DIAGNOSIS — I2510 Atherosclerotic heart disease of native coronary artery without angina pectoris: Secondary | ICD-10-CM

## 2011-10-14 DIAGNOSIS — Z8679 Personal history of other diseases of the circulatory system: Secondary | ICD-10-CM

## 2011-10-14 DIAGNOSIS — I509 Heart failure, unspecified: Secondary | ICD-10-CM

## 2011-10-14 MED ORDER — ROSUVASTATIN CALCIUM 5 MG PO TABS: 5.0000 mg | ORAL_TABLET | Freq: Every day | ORAL | Status: DC

## 2011-10-14 NOTE — Patient Instructions (Addendum)
Stop lipitor (atorvastatin).  After you have been off lipitor (atorvastatin) for 1 week start Crestor 5mg  daily.   Take coenzyme Q10 200mg  daily while you are taking  Crestor.   Your physician recommends that you return for a FASTING lipid profile /liver profile in 2 months.  Your physician has requested that you have a carotid duplex. This test is an ultrasound of the carotid arteries in your neck. It looks at blood flow through these arteries that supply the brain with blood. Allow one hour for this exam. There are no restrictions or special instructions.  Your physician wants you to follow-up in: 6 months with Dr Shirlee Latch. (March 2014). You will receive a reminder letter in the mail two months in advance. If you don't receive a letter, please call our office to schedule the follow-up appointment.

## 2011-10-15 NOTE — Progress Notes (Addendum)
Patient ID: Heather Crawford, female   DOB: 1946/01/31, 66 y.o.   MRN: [id number] PCP: Dr. Debby Bud  66 yo with history of rheumatic fever and rheumatic valve disease presents for followup.  Patient had an episode as a child that is very consistent with rheumatic fever and has had a heart murmur since that time.  She had been followed by a cardiologist in [facility] in the past for aortic stenosis and mitral regurgitation.   Echo in 1/12 showed moderate aortic stenosis and mild to moderate mitral regurgitation.  EF was mildly decreased (45-50%) with abnormal septal motion consistent with LBBB.   Patient reported dyspnea with moderate exertion such as after walking 100 feet or walking up an incline. She also would get a tightness/pain in her neck when doing the same activities.  No chest pain.  No neck tightness at rest.  We did an adenosine myoview in 2/12 with EF 56% and evidence for ischemia and scar in the mid to apical anterior wall, septal wall, and apex.  Left heart cath was done in 3/12 showing significant RCA disease: 40% ostial, 60% mid, 95% distal.  Dr. Riley Kill attempted to intervene on the RCA but was unable to adequately seat the catheter for intervention.  Given the large territory supplied by the RCA, CVTS was consulted regarding CABG + AVR given moderate AS.  Patient had AVR with tissue valve and RIMA-RCA in 3/12. Post-op echo in 5/12 showed EF 55-60% and a well-seated bioprosthetic valve.   Patient continues to have low back pain. She has also developed muscle soreness in her arms and legs that limits activity.  She thinks that this has begun since she went on atorvastatin.  She does not get short of breath walking on flat ground but has mild dyspnea walking up a flight of steps.  No chest pain.  Weight is down 1 lb.  She had a breast biopsy recently showing atypical hyperplasia and needs a lumpectomy.  She is using Lasix only prn and wonders if she needs to keep filling the prescription.   ECG:  NSR, LBBB  Labs (1/12): TGs 596, HDL 40, LDL 171, HCT 39, K 4.6, creatinine  Labs (2/12): K 4.8, creatinine 0.6 Labs (4/12): K 3.9, creatinine 0.8, HCT 32.8 Labs (5/12): K 3.9, creatinine 0.7, LDL 53, HDL 37 Labs (9/12): K 4, creatinine 0.7 Labs (1/13): LDL 89, HDL 42 Labs (7/13): LDL 104, HDL 42, K 4.6, creatinine 0.6  Allergies (verified):  1)  ! Bactrim 2)  ! Amoxicillin 3)  ! Neomycin 4)  ! * Adhesive Tape  Past Medical History: 1. RHEUMATIC FEVER, HX OF (ICD-V12.59) 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 HEART VALVE DISEASE (ICD-424.90): Echo (1/12) with mildly dilated LV, EF 45-50% with paradoxical septal motion consistent with LBBB, moderate aortic stenosis with mean gradient 27 mmHg, trivial AI, mild to moderate mitral regurgitation with calcified mitral valve.  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 Echo (5/12): EF 55-60%, basal inferior hypokinesis, moderate diastolic dysfunction, normally functioning bioprosthetic aortic valve with mean gradient 13 mmHg, normal RV.  4. LBBB 5. ALLERGIC RHINITIS (ICD-477.9) 6. GERD (ICD-530.81) 7. Hx of DIVERTICULITIS, ACUTE (ICD-562.11) 8. Chronic low back pain with history of back surgery 9. Appendectomy 10. Hysterectomy 11. Hyperlipidemia: Myalgias with atorvastatin.  12. CAD: Adenosine myoview (2/12) with EF 56%, ischemia with scar in the mid to apical anterior wall, septal wall, and apex.  LHC (3/12) with EF 55%, 40% ostial RCA, 60% mid RCA, 95% distal RCA.  Intervention attempted but unable to adequately seat catheter.  Patient had CABG-AVR in 3/12 with RIMA-RCA.    13. Carotid dopplers (3/12): no significant stenosis.             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   Current Outpatient Prescriptions  Medication Sig Dispense Refill  . albuterol-ipratropium (COMBIVENT) 18-103 MCG/ACT inhaler Inhale 2 puffs into the lungs every 6 (six) hours as needed.        Marland Kitchen aspirin 325 MG tablet Take 325 mg by mouth daily.      . carvedilol (COREG) 6.25 MG tablet Take 1 tablet (6.25 mg total) by mouth 2 (two) times daily with a meal.  180 tablet  3  . fluticasone (FLONASE) 50 MCG/ACT nasal spray Place 1 spray into the nose daily.  16 g  6  . furosemide (LASIX) 20 MG tablet as needed.       . furosemide (LASIX) 20 MG tablet TAKE 1 TABLET (20 MG TOTAL) BY MOUTH DAILY.  30 tablet  6  . meclizine (ANTIVERT) 12.5 MG tablet Take 12.5 mg by mouth 3 (three) times daily as needed.      . ranitidine (ZANTAC) 150 MG tablet Take 150 mg by mouth as needed.       . Coenzyme Q10 200 MG TABS Take by mouth.    0  . rosuvastatin (CRESTOR) 5 MG tablet Take 1 tablet (5 mg total) by mouth daily.  30 tablet  6    BP 122/72  Pulse 67  Ht 5\' 2"  (1.575 m)  Wt 177 lb (80.287 kg)  BMI 32.37 kg/m2 General: NAD, overweight Neck: No JVD, no thyromegaly or thyroid nodule.  Lungs: Slightly decreased breath sounds right base.  CV: Nondisplaced PMI.  Heart regular S1/S2, no S3/S4, 2/6 early SEM RUSB.  No peripheral edema. Soft bilateral carotid bruit (may be radiation of aortic area murmur).  Normal pedal pulses.  Abdomen: Soft, nontender, no hepatosplenomegaly, no distention.  Neurologic: Alert and oriented x 3.  Psych: Normal affect. Extremities: No clubbing or cyanosis.   Assessment/Plan  1. Aortic valve disorder Patient had moderate AS likely due to rheumatic heart disease. She had AVR with bioprosthetic valve in 3/12 since she was also having CABG. Valve was well-seated on last echo.  2. CAD, NATIVE  VESSEL Status post RIMA-RCA in 3/12. No further chest pain. She will continue ASA and Coreg.  I am going to adjust her statin due to likely atorvastatin-induced myalgias.   3. Diastolic CHF, chronic  NYHA class II. She is not very active, part of this is due to statin-induced myalgias. I am going to adjust her statin.  I would like her to try riding a recumbent bike if her back can tolerate this.  She appears euvolemic.  She does not need to fill Lasix prescription any more.  4. HYPERLIPIDEMIA-MIXED  Goal LDL < 70.  She seems to be having myalgias with atorvastatin.  I am going to have her stop atorvastatin now.  After a week off statin, I will have her start Crestor 5 mg daily with coenzyme Q10 200 mg daily.  If she tolerates this, I will titrate it up to get LDL to goal.  Check lipids/LFTs in 2 months.  5. Pre-operative evaluation Stable exertional dyspnea, no chest pain.  She had CABG in 2012.  I do not think that any additional workup is necessary; she is acceptable risk for lumpectomy.  She should continue Coreg peri-operatively.  6. Carotid bruit Patient has soft bilateral bruits in the neck.  I suspect this is radiated from the aortic valve.  However, I will get carotid dopplers to rule out significant stenosis.   Rmani Kapusta [employer]

## 2011-10-20 ENCOUNTER — Encounter: Payer: Self-pay | Admitting: Internal Medicine

## 2011-10-21 ENCOUNTER — Encounter (INDEPENDENT_AMBULATORY_CARE_PROVIDER_SITE_OTHER): Payer: Medicare Other

## 2011-10-21 DIAGNOSIS — R0989 Other specified symptoms and signs involving the circulatory and respiratory systems: Secondary | ICD-10-CM

## 2011-10-21 DIAGNOSIS — I6529 Occlusion and stenosis of unspecified carotid artery: Secondary | ICD-10-CM

## 2011-10-26 ENCOUNTER — Encounter (HOSPITAL_COMMUNITY): Payer: Self-pay | Admitting: Pharmacy Technician

## 2011-10-29 ENCOUNTER — Encounter (HOSPITAL_COMMUNITY): Payer: Self-pay

## 2011-10-29 ENCOUNTER — Encounter (HOSPITAL_COMMUNITY)
Admission: RE | Admit: 2011-10-29 | Discharge: 2011-10-29 | Disposition: A | Discharge status: Home / Self Care | Payer: Medicare Other | Source: Ambulatory Visit | Attending: Surgery | Admitting: Surgery

## 2011-10-29 HISTORY — DX: Personal history of urinary (tract) infections: Z87.440

## 2011-10-29 HISTORY — DX: Personal history of other diseases of the digestive system: Z87.19

## 2011-10-29 HISTORY — DX: Malignant (primary) neoplasm, unspecified: C80.1

## 2011-10-29 HISTORY — DX: Heart failure, unspecified: I50.9

## 2011-10-29 HISTORY — DX: Personal history of other specified conditions: Z87.898

## 2011-10-29 HISTORY — DX: Unspecified hemorrhoids: K64.9

## 2011-10-29 HISTORY — DX: Frequency of micturition: R35.0

## 2011-10-29 HISTORY — DX: Snoring: R06.83

## 2011-10-29 HISTORY — DX: Presence of prosthetic heart valve: Z95.2

## 2011-10-29 HISTORY — DX: Other specified postprocedural states: Z98.890

## 2011-10-29 HISTORY — DX: Atherosclerotic heart disease of native coronary artery without angina pectoris: I25.10

## 2011-10-29 HISTORY — DX: Unspecified osteoarthritis, unspecified site: M19.90

## 2011-10-29 HISTORY — DX: Other specified postprocedural states: R11.2

## 2011-10-29 LAB — SURGICAL PCR SCREEN: MRSA, PCR: NEGATIVE

## 2011-10-29 LAB — CBC
Hemoglobin: 13.4 g/dL (ref 12.0–15.0)
MCHC: 33.6 g/dL (ref 30.0–36.0)
Platelets: 232 10*3/uL (ref 150–400)
RDW: 12.9 % (ref 11.5–15.5)

## 2011-10-29 LAB — BASIC METABOLIC PANEL
BUN: 17 mg/dL (ref 6–23)
Calcium: 9.5 mg/dL (ref 8.4–10.5)
GFR calc Af Amer: >90 mL/min (ref >90)
GFR calc non Af Amer: >90 mL/min (ref >90)
Potassium: 4.3 mEq/L (ref 3.5–5.1)

## 2011-10-29 NOTE — Pre-Procedure Instructions (Signed)
20 Heather Crawford  10/29/2011   Your procedure is scheduled on:  Wednesday, October 2  Report to [facility] at 0930 AM. After needle localization at breast center  Call this number if you have problems the morning of surgery: [phone]   Remember:   Do not eat food or drink liquids :After Midnight   Take these medicines the morning of surgery with A SIP OF WATER: Carvedilol,Ranitidine,Zyrtec,Flonase,inhaler as needed   Do not wear jewelry, make-up or nail polish.  Do not wear lotions, powders, or perfumes. You may wear deodorant.  Do not shave 48 hours prior to surgery. Men may shave face and neck.  Do not bring valuables to the hospital.  Contacts, dentures or bridgework may not be worn into surgery.  Leave suitcase in the car. After surgery it may be brought to your room.  For patients admitted to the hospital, checkout time is 11:00 AM the day of discharge.   Patients discharged the day of surgery will not be allowed to drive home.  Name and phone number of your driver:Larry, spouse  Special Instructions: Shower using CHG 2 nights before surgery and the night before surgery.  If you shower the day of surgery use CHG.  Use special wash - you have one bottle of CHG for all showers.  You should use approximately 1/3 of the bottle for each shower.   Please read over the following fact sheets that you were given: Pain Booklet, Coughing and Deep Breathing, MRSA Information and Surgical Site Infection Prevention

## 2011-11-03 ENCOUNTER — Telehealth: Payer: Self-pay | Admitting: *Deleted

## 2011-11-03 MED ORDER — CEFAZOLIN SODIUM-DEXTROSE 2-3 GM-% IV SOLR
2.0000 g | INTRAVENOUS | Status: DC
  Filled 2011-11-03: qty 50

## 2011-11-03 NOTE — H&P (Signed)
[name], MD 10/08/2011 3:04 PM Signed  Patient ID: Heather Crawford, female DOB: October 18, 1945, 66 y.o. MRN: [id number]  Chief Complaint   Patient presents with   .  Breast Problem     new pt- eval atypical lobular hyperplasia    HPI  [name] is a 66 y.o. female.  HPI  She is referred by Dr. Derinda Late after recent mammograms demonstrated an abnormality in the right breast. She has since had a biopsy of this area showing atypical cells. She is referred for surgical evaluation. She has had no previous surgery on her breast. She denies current nipple discharge. She does have a significant family history of her mother dying of breast cancer as well as her sister having breast cancer as well.  Past Medical History   Diagnosis  Date   .  History of rheumatic fever    .  PPD positive      in the past   .  Aortic stenosis      a. echo 1/12 w/mildly dilated LV, EF 45-50% w/paradoxical septal motion consistent w/ LBBB, moderate aortic stenosis w/mean gradient 27 mmHg, trivial AI, mild to moderate MR w/calcified mitral valve; b. s/p AVR with 21 mm Magna Ease valve   .  LBBB (left bundle branch block)    .  Allergic rhinitis, cause unspecified    .  Esophageal reflux    .  Diverticulitis of colon (without mention of hemorrhage)    .  Coronary artery disease      adenosine myoview 2/12 w/EF 56%, ISCHEMIA W/SCAR IN THE MID TO APICAL ANTERIO WALL, SEPTAL WALL, AND APEX. LHC 3/12 W/EF 55%, 40% OSTIAL RCA, 60% MID RCA, 95% DISTAL RC A, INTERVENTION ATTEMPTED BUT UNABLE TO ADEQUADATELY SEAT CATHETER.   Marland Kitchen  Hyperlipidemia    .  Hx of hysterectomy    .  Chronic back pain      H/O BACK SURGERY   .  Glucose intolerance (impaired glucose tolerance)      A1C 6.3 (4/12)   .  CAD (coronary artery disease)      a. s/p CABG 4/12: RIMA-RCA   .  Heart murmur    .  Myocardial infarction     Past Surgical History   Procedure  Date   .  Appendectomy  1973   .  Tonsillectomy      1955   .  Lumbar  disc surgery      [facility] [place]   .  Cardiac catheterization    .  Abdominal hysterectomy    .  Dilation and curettage of uterus    .  Laparoscopic lysis intestinal adhesions    .  Endometrial ablation     Family History   Problem  Relation  Age of Onset   .  Hypertension  Mother    .  Cancer  Mother       BREAST    .  Hypothyroidism  Mother    .  Diabetes type II  Mother    .  Heart disease  Mother    .  Heart attack  Father       CAD/MI, CABG @ 58    .  Cancer  Sister       BREAST    .  Cancer        COLON CANCER, AUNT    .  Cancer  Maternal Aunt       breast    .  Cancer  Paternal Uncle       colon    Social History  History   Substance Use Topics   .  Smoking status:  Former Smoker     Types:  Cigarettes     Quit date:  02/03/2000   .  Smokeless tobacco:  Not on file   .  Alcohol Use:  No    Allergies   Allergen  Reactions   .  Tape      breakouts   .  Amoxicillin    .  Neomycin    .  Sulfamethoxazole W-Trimethoprim     Current Outpatient Prescriptions   Medication  Sig  Dispense  Refill   .  albuterol-ipratropium (COMBIVENT) 18-103 MCG/ACT inhaler  Inhale 2 puffs into the lungs every 6 (six) hours as needed.     Marland Kitchen  aspirin 325 MG tablet  Take 325 mg by mouth daily.     Marland Kitchen  atorvastatin (LIPITOR) 40 MG tablet  Take 1 tablet (40 mg total) by mouth daily.  90 tablet  3   .  carvedilol (COREG) 6.25 MG tablet  Take 1 tablet (6.25 mg total) by mouth 2 (two) times daily with a meal.  180 tablet  3   .  fluticasone (FLONASE) 50 MCG/ACT nasal spray  Place 1 spray into the nose daily.  16 g  6   .  furosemide (LASIX) 20 MG tablet  TAKE 1 TABLET (20 MG TOTAL) BY MOUTH DAILY.  30 tablet  6   .  ranitidine (ZANTAC) 150 MG tablet  Take 150 mg by mouth as needed.     .  furosemide (LASIX) 20 MG tablet  Pt only takes tablet if she needs it     .  meclizine (ANTIVERT) 12.5 MG tablet  Take 1 tablet (12.5 mg total) by mouth 3 (three) times daily as needed.   10 tablet  0    Review of Systems  Review of Systems  Constitutional: Negative for fever, chills and unexpected weight change.  HENT: Negative for hearing loss, congestion, sore throat, trouble swallowing and voice change.  Eyes: Negative for visual disturbance.  Respiratory: Negative for cough and wheezing.  Cardiovascular: Negative for chest pain, palpitations and leg swelling.  Gastrointestinal: Negative for nausea, vomiting, abdominal pain, diarrhea, constipation, blood in stool, abdominal distention and anal bleeding.  Genitourinary: Negative for hematuria, vaginal bleeding and difficulty urinating.  Musculoskeletal: Negative for arthralgias.  Skin: Negative for rash and wound.  Neurological: Negative for seizures, syncope and headaches.  Hematological: Negative for adenopathy. Does not bruise/bleed easily.  Psychiatric/Behavioral: Negative for confusion.   Blood pressure 136/76, pulse 76, temperature 97.8 F (36.6 C), temperature source Temporal, resp. rate 18, height 5\' 2"  (1.575 m), weight 178 lb 6.4 oz (80.922 kg).  Physical Exam  Physical Exam  Constitutional: She is oriented to person, place, and time. She appears well-developed and well-nourished. No distress.  HENT:  Head: Normocephalic and atraumatic.  Right Ear: External ear normal.  Left Ear: External ear normal.  Nose: Nose normal.  Mouth/Throat: Oropharynx is clear and moist. No oropharyngeal exudate.  Eyes: Conjunctivae and EOM are normal. Pupils are equal, round, and reactive to light. No scleral icterus.  Neck: Normal range of motion. Neck supple. No tracheal deviation present.  Cardiovascular: Normal rate, regular rhythm, normal heart sounds and intact distal pulses.  No murmur heard.  Pulmonary/Chest: Effort normal and breath sounds normal. No respiratory distress. She has no wheezes.  Lymphadenopathy:  She has no cervical adenopathy.  Neurological: She is alert and oriented to person, place, and time.    Skin: Skin is warm and dry. No rash noted. No erythema.  Psychiatric: Her behavior is normal. Judgment normal.  breasts: There are no palpable masses in either breast. Areola are normal are normal. The needle biopsy site in the right breast is well healed. There is no axillary lymphadenopathy  Data Reviewed  I have reviewed the patient's pathology report and mammograms. This shows atypical lobular hyperplasia of the right breast  Assessment   Atypical lobular hyperplasia of the right breast   Plan   Needle localized right breast lumpectomy was recommended for histologic evaluation to rule out malignancy. I discussed this with her and her husband. I discussed the risks of surgery which includes bleeding, infection, need for further surgery if malignancy is present, et Karie Soda. She understands and wishes to proceed. Likelihood of success is good   Seaborn Nakama A

## 2011-11-03 NOTE — Telephone Encounter (Signed)
I had not. 40-59% bilateral ICA stenosis, repeat in 1 year. ----- Message ----- From: Jacqlyn Krauss, RN Sent: 11/03/2011 8:31 AM To: Laurey Morale, MD Did you see the carotid doppler done 10/21/11? Thurston Hole  11/03/11 pt aware of result.

## 2011-11-04 ENCOUNTER — Ambulatory Visit (HOSPITAL_COMMUNITY)
Admission: RE | Admit: 2011-11-04 | Discharge: 2011-11-04 | Disposition: A | Discharge status: Home / Self Care | Payer: Medicare Other | Source: Ambulatory Visit | Attending: Surgery | Admitting: Surgery

## 2011-11-04 ENCOUNTER — Encounter (HOSPITAL_COMMUNITY): Payer: Self-pay | Admitting: Anesthesiology

## 2011-11-04 ENCOUNTER — Encounter (HOSPITAL_COMMUNITY)
Admission: RE | Disposition: A | Discharge status: Home / Self Care | Payer: Self-pay | Source: Ambulatory Visit | Attending: Surgery

## 2011-11-04 ENCOUNTER — Ambulatory Visit (HOSPITAL_COMMUNITY): Payer: Medicare Other | Admitting: Anesthesiology

## 2011-11-04 ENCOUNTER — Encounter (HOSPITAL_COMMUNITY): Payer: Self-pay | Admitting: *Deleted

## 2011-11-04 DIAGNOSIS — K219 Gastro-esophageal reflux disease without esophagitis: Secondary | ICD-10-CM | POA: Insufficient documentation

## 2011-11-04 DIAGNOSIS — Z01818 Encounter for other preprocedural examination: Secondary | ICD-10-CM | POA: Insufficient documentation

## 2011-11-04 DIAGNOSIS — I252 Old myocardial infarction: Secondary | ICD-10-CM | POA: Insufficient documentation

## 2011-11-04 DIAGNOSIS — I10 Essential (primary) hypertension: Secondary | ICD-10-CM | POA: Insufficient documentation

## 2011-11-04 DIAGNOSIS — N6089 Other benign mammary dysplasias of unspecified breast: Secondary | ICD-10-CM

## 2011-11-04 DIAGNOSIS — J449 Chronic obstructive pulmonary disease, unspecified: Secondary | ICD-10-CM | POA: Insufficient documentation

## 2011-11-04 DIAGNOSIS — I2510 Atherosclerotic heart disease of native coronary artery without angina pectoris: Secondary | ICD-10-CM | POA: Insufficient documentation

## 2011-11-04 DIAGNOSIS — J4489 Other specified chronic obstructive pulmonary disease: Secondary | ICD-10-CM | POA: Insufficient documentation

## 2011-11-04 DIAGNOSIS — Z126 Encounter for screening for malignant neoplasm of bladder: Secondary | ICD-10-CM | POA: Insufficient documentation

## 2011-11-04 DIAGNOSIS — N6099 Unspecified benign mammary dysplasia of unspecified breast: Secondary | ICD-10-CM

## 2011-11-04 SURGERY — BREAST LUMPECTOMY WITH NEEDLE LOCALIZATION
Anesthesia: General | Laterality: Right | Wound class: Clean

## 2011-11-04 MED ORDER — LACTATED RINGERS IV SOLN
INTRAVENOUS | Status: DC | PRN
  Administered 2011-11-04: 12:00:00 via INTRAVENOUS

## 2011-11-04 MED ORDER — HYDROCODONE-ACETAMINOPHEN 5-325 MG PO TABS: 1.0000 | ORAL_TABLET | Freq: Four times a day (QID) | ORAL | Status: DC | PRN

## 2011-11-04 MED ORDER — ONDANSETRON HCL 4 MG/2ML IJ SOLN
INTRAMUSCULAR | Status: DC | PRN
  Administered 2011-11-04: 4 mg via INTRAVENOUS

## 2011-11-04 MED ORDER — MIDAZOLAM HCL 5 MG/5ML IJ SOLN
INTRAMUSCULAR | Status: DC | PRN
  Administered 2011-11-04: 2 mg via INTRAVENOUS

## 2011-11-04 MED ORDER — BUPIVACAINE HCL (PF) 0.25 % IJ SOLN
INTRAMUSCULAR | Status: AC
  Filled 2011-11-04: qty 30

## 2011-11-04 MED ORDER — OXYCODONE HCL 5 MG PO TABS: 5.0000 mg | ORAL_TABLET | Freq: Once | ORAL | Status: DC | PRN

## 2011-11-04 MED ORDER — LIDOCAINE HCL (CARDIAC) 20 MG/ML IV SOLN: INTRAVENOUS | Status: DC | PRN

## 2011-11-04 MED ORDER — BUPIVACAINE HCL (PF) 0.25 % IJ SOLN
INTRAMUSCULAR | Status: DC | PRN
  Administered 2011-11-04: 20 mL

## 2011-11-04 MED ORDER — 0.9 % SODIUM CHLORIDE (POUR BTL) OPTIME
TOPICAL | Status: DC | PRN
  Administered 2011-11-04: 1000 mL

## 2011-11-04 MED ORDER — FENTANYL CITRATE 0.05 MG/ML IJ SOLN
INTRAMUSCULAR | Status: DC | PRN
  Administered 2011-11-04: 100 ug via INTRAVENOUS

## 2011-11-04 MED ORDER — HYDROMORPHONE HCL PF 1 MG/ML IJ SOLN: 0.2500 mg | INTRAMUSCULAR | Status: DC | PRN

## 2011-11-04 MED ORDER — LIDOCAINE HCL (CARDIAC) 20 MG/ML IV SOLN
INTRAVENOUS | Status: DC | PRN
  Administered 2011-11-04: 75 mg via INTRAVENOUS

## 2011-11-04 MED ORDER — LACTATED RINGERS IV SOLN
INTRAVENOUS | Status: DC
  Administered 2011-11-04: 12:00:00 via INTRAVENOUS

## 2011-11-04 MED ORDER — PROPOFOL 10 MG/ML IV BOLUS
INTRAVENOUS | Status: DC | PRN
  Administered 2011-11-04: 150 mg via INTRAVENOUS

## 2011-11-04 MED ORDER — PHENYLEPHRINE HCL 10 MG/ML IJ SOLN
INTRAMUSCULAR | Status: DC | PRN
  Administered 2011-11-04: 80 ug via INTRAVENOUS

## 2011-11-04 MED ORDER — OXYCODONE HCL 5 MG/5ML PO SOLN: 5.0000 mg | Freq: Once | ORAL | Status: DC | PRN

## 2011-11-04 MED ORDER — DROPERIDOL 2.5 MG/ML IJ SOLN: 0.6250 mg | INTRAMUSCULAR | Status: DC | PRN

## 2011-11-04 MED ORDER — BUPIVACAINE-EPINEPHRINE 0.25% -1:200000 IJ SOLN: INTRAMUSCULAR | Status: DC | PRN

## 2011-11-04 SURGICAL SUPPLY — 41 items
ADH SKN CLS APL DERMABOND .7 (GAUZE/BANDAGES/DRESSINGS) ×1
APL SKNCLS STERI-STRIP NONHPOA (GAUZE/BANDAGES/DRESSINGS) ×1
BENZOIN TINCTURE PRP APPL 2/3 (GAUZE/BANDAGES/DRESSINGS) ×2 IMPLANT
BLADE SURG 10 STRL SS (BLADE) ×2 IMPLANT
BLADE SURG 15 STRL LF DISP TIS (BLADE) ×1 IMPLANT
BLADE SURG 15 STRL SS (BLADE) ×2
CANISTER SUCTION 2500CC (MISCELLANEOUS) ×1 IMPLANT
CHLORAPREP W/TINT 26ML (MISCELLANEOUS) ×2 IMPLANT
CLOTH BEACON ORANGE TIMEOUT ST (SAFETY) ×2 IMPLANT
COVER SURGICAL LIGHT HANDLE (MISCELLANEOUS) ×2 IMPLANT
DERMABOND ADVANCED (GAUZE/BANDAGES/DRESSINGS) ×1
DERMABOND ADVANCED .7 DNX12 (GAUZE/BANDAGES/DRESSINGS) IMPLANT
DEVICE DUBIN SPECIMEN MAMMOGRA (MISCELLANEOUS) ×2 IMPLANT
DRAPE CHEST BREAST 15X10 FENES (DRAPES) ×2 IMPLANT
DRSG TEGADERM 4X4.75 (GAUZE/BANDAGES/DRESSINGS) ×2 IMPLANT
ELECT CAUTERY BLADE 6.4 (BLADE) ×2 IMPLANT
ELECT REM PT RETURN 9FT ADLT (ELECTROSURGICAL) ×2
ELECTRODE REM PT RTRN 9FT ADLT (ELECTROSURGICAL) ×1 IMPLANT
GLOVE SURG SIGNA 7.5 PF LTX (GLOVE) ×2 IMPLANT
GOWN PREVENTION PLUS XLARGE (GOWN DISPOSABLE) ×2 IMPLANT
GOWN STRL NON-REIN LRG LVL3 (GOWN DISPOSABLE) ×2 IMPLANT
KIT BASIN OR (CUSTOM PROCEDURE TRAY) ×2 IMPLANT
KIT MARKER MARGIN INK (KITS) IMPLANT
KIT ROOM TURNOVER OR (KITS) ×2 IMPLANT
NS IRRIG 1000ML POUR BTL (IV SOLUTION) ×2 IMPLANT
PACK SURGICAL SETUP 50X90 (CUSTOM PROCEDURE TRAY) ×2 IMPLANT
PAD ARMBOARD 7.5X6 YLW CONV (MISCELLANEOUS) ×2 IMPLANT
PENCIL BUTTON HOLSTER BLD 10FT (ELECTRODE) ×2 IMPLANT
SPONGE GAUZE 4X4 12PLY (GAUZE/BANDAGES/DRESSINGS) ×2 IMPLANT
SPONGE LAP 4X18 X RAY DECT (DISPOSABLE) ×2 IMPLANT
STRIP CLOSURE SKIN 1/2X4 (GAUZE/BANDAGES/DRESSINGS) ×2 IMPLANT
SUT MON AB 4-0 PC3 18 (SUTURE) ×2 IMPLANT
SUT SILK 2 0 SH (SUTURE) IMPLANT
SUT VIC AB 3-0 SH 27 (SUTURE) ×2
SUT VIC AB 3-0 SH 27XBRD (SUTURE) ×1 IMPLANT
SYR BULB 3OZ (MISCELLANEOUS) ×2 IMPLANT
SYR CONTROL 10ML LL (SYRINGE) ×2 IMPLANT
TOWEL OR 17X24 6PK STRL BLUE (TOWEL DISPOSABLE) ×2 IMPLANT
TOWEL OR 17X26 10 PK STRL BLUE (TOWEL DISPOSABLE) ×2 IMPLANT
TUBE CONNECTING 12X1/4 (SUCTIONS) ×1 IMPLANT
YANKAUER SUCT BULB TIP NO VENT (SUCTIONS) ×1 IMPLANT

## 2011-11-04 NOTE — Op Note (Signed)
BREAST LUMPECTOMY WITH NEEDLE LOCALIZATION  Procedure Note  [name] 11/04/2011   Pre-op Diagnosis: atypical cells right breast     Post-op Diagnosis: same  Procedure(s): RIGHT BREAST LUMPECTOMY WITH NEEDLE LOCALIZATION  Surgeon(s): [name], MD  Anesthesia: Choice  Staff:  Gunnar Fusi, RN - Circulator Janeece Agee Pingue, CST - Scrub Person Sudie Bailey, RN - Circulator Maureen Ralphs, RN - Relief Scrub  Estimated Blood Loss: Minimal               Specimens: right breast         Indications: This is a 66 year old female who was found to have an abnormality in the right breast. Stereotactic biopsy revealed atypical cells.  The decision has been made to proceed with needle localized right breast lumpectomy for histologic evaluation  Procedure: The patient was brought to the operating room and identified as the correct patient. She was placed supine on the operating room table and general anesthesia was induced. A localization wire had already been placed in the right breast by the radiologist. Her right breast was prepped and draped in the usual sterile fashion. I anesthetized the skin with Marcaine. I then made a longitudinal incision with a scalpel incorporating the wire. I took this down to the breast tissue with electrocautery. I then performed a wide lumpectomy going down to the chest wall with what appeared to be very large margins around the area of suspicion. Once this vessel was doubly removed, x-ray confirmed the suspicious area was in the center of the biopsy specimen. Wound was irrigated with saline and hemostasis was achieved with cautery. The specimen was sent to pathology for evaluation. I then closed the subcutaneous tissue with interrupted 3-0 Vicryl sutures and closed the skin with a running 4-0 Monocryl. Dermabond was applied. Gauze and tape and also applied. The patient tolerated the procedure well. CORRECT at the end of procedure.  The patient was then asked to the operating room and taken in stable condition to the recovery room. [name]   Date: 11/04/2011  Time: 12:31 PM

## 2011-11-04 NOTE — Progress Notes (Signed)
Dr. Magnus Ivan notified of pt.'s allergy to amoxicillin. Pt. States she has itching and a rash. He stated to continue with ancef as ordered.

## 2011-11-04 NOTE — Preoperative (Signed)
Beta Blockers   Reason not to administer Beta Blockers:Not Applicable 

## 2011-11-04 NOTE — Anesthesia Postprocedure Evaluation (Signed)
Anesthesia Post Note  Patient: Heather Crawford  Procedure(s) Performed: Procedure(s) (LRB): BREAST LUMPECTOMY WITH NEEDLE LOCALIZATION (Right)  Anesthesia type: general  Patient location: PACU  Post pain: Pain level controlled  Post assessment: Patient's Cardiovascular Status Stable  Last Vitals:  Filed Vitals:   11/04/11 1245  BP: 152/63  Pulse: 74  Temp: 36 C  Resp: 11    Post vital signs: Reviewed and stable  Level of consciousness: sedated  Complications: No apparent anesthesia complications

## 2011-11-04 NOTE — Interval H&P Note (Signed)
History and Physical Interval Note:  No change in H and P  11/04/2011 10:49 AM  Heather Crawford  has presented today for surgery, with the diagnosis of atypical cells right breast  The various methods of treatment have been discussed with the patient and family. After consideration of risks, benefits and other options for treatment, the patient has consented to  Procedure(s) (LRB) with comments: BREAST LUMPECTOMY WITH NEEDLE LOCALIZATION (Right) as a surgical intervention .  The patient's history has been reviewed, patient examined, no change in status, stable for surgery.  I have reviewed the patient's chart and labs.  Questions were answered to the patient's satisfaction.     Heather Crawford A

## 2011-11-04 NOTE — Transfer of Care (Signed)
Immediate Anesthesia Transfer of Care Note  Patient: Heather Crawford  Procedure(s) Performed: Procedure(s) (LRB) with comments: BREAST LUMPECTOMY WITH NEEDLE LOCALIZATION (Right)  Patient Location: PACU  Anesthesia Type: General  Level of Consciousness: awake, alert , oriented and sedated  Airway & Oxygen Therapy: Patient Spontanous Breathing and Patient connected to nasal cannula oxygen  Post-op Assessment: Report given to PACU RN, Post -op Vital signs reviewed and stable and Patient moving all extremities  Post vital signs: Reviewed and stable  Complications: No apparent anesthesia complications

## 2011-11-04 NOTE — Anesthesia Preprocedure Evaluation (Addendum)
Anesthesia Evaluation  Patient identified by MRN, date of birth, ID band Patient awake    Reviewed: Allergy & Precautions, H&P , NPO status , Patient's Chart, lab work & pertinent test results  History of Anesthesia Complications (+) PONV  Airway Mallampati: II TM Distance: >3 FB Neck ROM: Full    Dental  (+) Teeth Intact and Dental Advisory Given   Pulmonary shortness of breath and with exertion, COPDformer smoker,  breath sounds clear to auscultation  Pulmonary exam normal       Cardiovascular hypertension, Pt. on medications + CAD, + Past MI, + CABG and +CHF + Valvular Problems/Murmurs AS Rhythm:Regular Rate:Normal + Systolic Click- Carotid Bruit    Neuro/Psych negative neurological ROS     GI/Hepatic Neg liver ROS, hiatal hernia, GERD-  Medicated,  Endo/Other    Renal/GU negative Renal ROS     Musculoskeletal   Abdominal   Peds  Hematology   Anesthesia Other Findings   Reproductive/Obstetrics                          Anesthesia Physical Anesthesia Plan  ASA: III  Anesthesia Plan: General   Post-op Pain Management:    Induction: Intravenous  Airway Management Planned: LMA  Additional Equipment:   Intra-op Plan:   Post-operative Plan: Extubation in OR  Informed Consent: I have reviewed the patients History and Physical, chart, labs and discussed the procedure including the risks, benefits and alternatives for the proposed anesthesia with the patient or authorized representative who has indicated his/her understanding and acceptance.   Dental advisory given  Plan Discussed with: CRNA, Anesthesiologist and Surgeon  Anesthesia Plan Comments:         Anesthesia Quick Evaluation

## 2011-11-09 ENCOUNTER — Telehealth (INDEPENDENT_AMBULATORY_CARE_PROVIDER_SITE_OTHER): Payer: Self-pay | Admitting: General Surgery

## 2011-11-09 NOTE — Telephone Encounter (Signed)
Called pt back to let her know that once we get the path back that we will give her a call with the results

## 2011-11-11 ENCOUNTER — Encounter (INDEPENDENT_AMBULATORY_CARE_PROVIDER_SITE_OTHER): Payer: Self-pay

## 2011-11-25 ENCOUNTER — Ambulatory Visit (INDEPENDENT_AMBULATORY_CARE_PROVIDER_SITE_OTHER): Payer: Medicare Other | Admitting: Surgery

## 2011-11-25 ENCOUNTER — Encounter (INDEPENDENT_AMBULATORY_CARE_PROVIDER_SITE_OTHER): Payer: Self-pay | Admitting: Surgery

## 2011-11-25 VITALS — BP 122/78 | HR 78 | Temp 98.0°F | Resp 12 | Ht 62.0 in | Wt 181.0 lb

## 2011-11-25 DIAGNOSIS — Z09 Encounter for follow-up examination after completed treatment for conditions other than malignant neoplasm: Secondary | ICD-10-CM

## 2011-11-25 NOTE — Progress Notes (Signed)
Subjective:     Patient ID: Heather Crawford, female   DOB: May 15, 1945, 66 y.o.   MRN: [id number]  HPI  She is here for her first postoperative visit status post right breast lumpectomy for atypical cells. 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  I will see her back PRN

## 2011-12-14 ENCOUNTER — Other Ambulatory Visit: Payer: Self-pay | Admitting: Cardiology

## 2011-12-15 ENCOUNTER — Other Ambulatory Visit (INDEPENDENT_AMBULATORY_CARE_PROVIDER_SITE_OTHER): Payer: Medicare Other

## 2011-12-15 DIAGNOSIS — I25810 Atherosclerosis of coronary artery bypass graft(s) without angina pectoris: Secondary | ICD-10-CM

## 2011-12-15 DIAGNOSIS — I359 Nonrheumatic aortic valve disorder, unspecified: Secondary | ICD-10-CM

## 2011-12-15 LAB — HEPATIC FUNCTION PANEL
ALT: 20 U/L (ref 0–35)
AST: 19 U/L (ref 0–37)
Albumin: 3.8 g/dL (ref 3.5–5.2)
Total Protein: 6.5 g/dL (ref 6.0–8.3)

## 2011-12-15 LAB — LDL CHOLESTEROL, DIRECT: Direct LDL: 144.8 mg/dL

## 2011-12-15 LAB — LIPID PANEL
Cholesterol: 230 mg/dL — ABNORMAL HIGH (ref 0–200)
Total CHOL/HDL Ratio: 6
Triglycerides: 268 mg/dL — ABNORMAL HIGH (ref 0.0–149.0)

## 2011-12-22 ENCOUNTER — Other Ambulatory Visit: Payer: Self-pay | Admitting: *Deleted

## 2011-12-22 DIAGNOSIS — E785 Hyperlipidemia, unspecified: Secondary | ICD-10-CM

## 2011-12-22 MED ORDER — SIMVASTATIN 40 MG PO TABS: 40.0000 mg | ORAL_TABLET | Freq: Every day | ORAL | Status: DC

## 2012-02-16 ENCOUNTER — Other Ambulatory Visit (INDEPENDENT_AMBULATORY_CARE_PROVIDER_SITE_OTHER): Payer: Medicare Other

## 2012-02-16 DIAGNOSIS — E785 Hyperlipidemia, unspecified: Secondary | ICD-10-CM

## 2012-02-16 LAB — LIPID PANEL
Cholesterol: 183 mg/dL (ref 0–200)
HDL: 39.6 mg/dL (ref >39.00)
Triglycerides: 351 mg/dL — ABNORMAL HIGH (ref 0.0–149.0)
VLDL: 70.2 mg/dL — ABNORMAL HIGH (ref 0.0–40.0)

## 2012-02-16 LAB — HEPATIC FUNCTION PANEL: Albumin: 4.1 g/dL (ref 3.5–5.2)

## 2012-08-15 ENCOUNTER — Other Ambulatory Visit (INDEPENDENT_AMBULATORY_CARE_PROVIDER_SITE_OTHER): Payer: Medicare Other

## 2012-08-15 ENCOUNTER — Ambulatory Visit (INDEPENDENT_AMBULATORY_CARE_PROVIDER_SITE_OTHER): Payer: Medicare Other | Admitting: Internal Medicine

## 2012-08-15 ENCOUNTER — Encounter: Payer: Self-pay | Admitting: Internal Medicine

## 2012-08-15 VITALS — BP 140/76 | HR 66 | Temp 97.0°F | Resp 12 | Ht 63.0 in | Wt 185.0 lb

## 2012-08-15 DIAGNOSIS — I38 Endocarditis, valve unspecified: Secondary | ICD-10-CM

## 2012-08-15 DIAGNOSIS — E785 Hyperlipidemia, unspecified: Secondary | ICD-10-CM

## 2012-08-15 DIAGNOSIS — K219 Gastro-esophageal reflux disease without esophagitis: Secondary | ICD-10-CM

## 2012-08-15 DIAGNOSIS — I10 Essential (primary) hypertension: Secondary | ICD-10-CM

## 2012-08-15 DIAGNOSIS — I779 Disorder of arteries and arterioles, unspecified: Secondary | ICD-10-CM

## 2012-08-15 DIAGNOSIS — Z0000 Encounter for general adult medical examination without abnormal findings: Secondary | ICD-10-CM

## 2012-08-15 DIAGNOSIS — I5032 Chronic diastolic (congestive) heart failure: Secondary | ICD-10-CM

## 2012-08-15 DIAGNOSIS — I2510 Atherosclerotic heart disease of native coronary artery without angina pectoris: Secondary | ICD-10-CM

## 2012-08-15 HISTORY — DX: Disorder of arteries and arterioles, unspecified: I77.9

## 2012-08-15 LAB — COMPREHENSIVE METABOLIC PANEL
AST: 21 U/L (ref 0–37)
Albumin: 4.1 g/dL (ref 3.5–5.2)
Alkaline Phosphatase: 45 U/L (ref 39–117)
Potassium: 4.1 mEq/L (ref 3.5–5.1)
Sodium: 140 mEq/L (ref 135–145)
Total Bilirubin: 0.6 mg/dL (ref 0.3–1.2)
Total Protein: 7.2 g/dL (ref 6.0–8.3)

## 2012-08-15 LAB — HEMOGLOBIN AND HEMATOCRIT, BLOOD: Hemoglobin: 13 g/dL (ref 12.0–15.0)

## 2012-08-15 LAB — LIPID PANEL
Total CHOL/HDL Ratio: 4
Triglycerides: 311 mg/dL — ABNORMAL HIGH (ref 0.0–149.0)
VLDL: 62.2 mg/dL — ABNORMAL HIGH (ref 0.0–40.0)

## 2012-08-15 LAB — HEPATIC FUNCTION PANEL
ALT: 22 U/L (ref 0–35)
Total Protein: 7.2 g/dL (ref 6.0–8.3)

## 2012-08-15 LAB — LDL CHOLESTEROL, DIRECT: Direct LDL: 100.9 mg/dL

## 2012-08-15 MED ORDER — SIMVASTATIN 40 MG PO TABS: 40.0000 mg | ORAL_TABLET | Freq: Every day | ORAL | Status: DC

## 2012-08-15 MED ORDER — EZETIMIBE 10 MG PO TABS: 10.0000 mg | ORAL_TABLET | Freq: Every day | ORAL | Status: DC

## 2012-08-15 NOTE — Assessment & Plan Note (Signed)
Sept '13 Carotid doppler: Bilateral 40-59% lesions. For f/u 1 year

## 2012-08-15 NOTE — Patient Instructions (Addendum)
You are doing well. We will get carotid doppler and 2D echo in September with a follow up appointment with Dr. Shirlee Latch.  Labs today and the results will be available on MyChart  Thanks for coming to see me.

## 2012-08-15 NOTE — Assessment & Plan Note (Signed)
Stable on present medication with little breakthrough pain. Lab results - normal hemoglobin

## 2012-08-15 NOTE — Assessment & Plan Note (Signed)
Interval history is negative for any major illness, surgery or injury. Limited physical exam is normal. Lab results reviewed: elevated triglycerides, LDL not at goal... She is current with colonoscopy but due this year. She is current with breast cancer screening. Immunizations are up to date.  She is advised to try and exercise. Weight - need to watch portion size as well as content.   In summary - a nice woman who is medically stable but needs better control of lipids.

## 2012-08-15 NOTE — Assessment & Plan Note (Signed)
LDL is just at goal of 100 or less and could be lower given carotid and coronary diseasse. HDL is low. Triglycerides are elevated  Plan Diet, low fat and low carb  Add Zetia 10 mg once a day for additional lowering of LDL with a goal of 80 or less.   F/u  Lab in 3 months (order entered)

## 2012-08-15 NOTE — Assessment & Plan Note (Signed)
Stable w/o chest pain or limitations in activity

## 2012-08-15 NOTE — Assessment & Plan Note (Signed)
BP Readings from Last 3 Encounters:  08/15/12 140/76  11/25/11 122/78  11/04/11 125/74   Renal function and lytes normal.  Plan Continue present medications.

## 2012-08-15 NOTE — Assessment & Plan Note (Signed)
Stable today. No evidence of decompensation.  Plan Continue risk reduction  Continue present meds
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
## 2012-10-17 ENCOUNTER — Other Ambulatory Visit (HOSPITAL_COMMUNITY): Payer: Medicare Other

## 2012-10-19 ENCOUNTER — Encounter (INDEPENDENT_AMBULATORY_CARE_PROVIDER_SITE_OTHER): Payer: Self-pay

## 2012-10-20 ENCOUNTER — Encounter (INDEPENDENT_AMBULATORY_CARE_PROVIDER_SITE_OTHER): Payer: Medicare Other

## 2012-10-20 DIAGNOSIS — I6529 Occlusion and stenosis of unspecified carotid artery: Secondary | ICD-10-CM

## 2012-10-26 ENCOUNTER — Ambulatory Visit (HOSPITAL_COMMUNITY): Payer: Medicare Other | Attending: Internal Medicine | Admitting: Radiology

## 2012-10-26 DIAGNOSIS — I38 Endocarditis, valve unspecified: Secondary | ICD-10-CM

## 2012-10-26 DIAGNOSIS — R079 Chest pain, unspecified: Secondary | ICD-10-CM | POA: Insufficient documentation

## 2012-10-26 DIAGNOSIS — I079 Rheumatic tricuspid valve disease, unspecified: Secondary | ICD-10-CM | POA: Insufficient documentation

## 2012-10-26 DIAGNOSIS — I059 Rheumatic mitral valve disease, unspecified: Secondary | ICD-10-CM | POA: Insufficient documentation

## 2012-10-26 DIAGNOSIS — I359 Nonrheumatic aortic valve disorder, unspecified: Secondary | ICD-10-CM

## 2012-10-26 DIAGNOSIS — Z87891 Personal history of nicotine dependence: Secondary | ICD-10-CM | POA: Insufficient documentation

## 2012-10-26 DIAGNOSIS — E785 Hyperlipidemia, unspecified: Secondary | ICD-10-CM | POA: Insufficient documentation

## 2012-10-26 DIAGNOSIS — R0602 Shortness of breath: Secondary | ICD-10-CM | POA: Insufficient documentation

## 2012-10-26 DIAGNOSIS — I10 Essential (primary) hypertension: Secondary | ICD-10-CM | POA: Insufficient documentation

## 2012-10-26 NOTE — Progress Notes (Signed)
Echocardiogram performed.  

## 2012-11-22 ENCOUNTER — Encounter: Payer: Self-pay | Admitting: Cardiology

## 2012-11-22 ENCOUNTER — Ambulatory Visit (INDEPENDENT_AMBULATORY_CARE_PROVIDER_SITE_OTHER): Payer: Medicare Other | Admitting: Cardiology

## 2012-11-22 VITALS — BP 146/72 | HR 71 | Ht 63.0 in | Wt 180.0 lb

## 2012-11-22 DIAGNOSIS — I5032 Chronic diastolic (congestive) heart failure: Secondary | ICD-10-CM

## 2012-11-22 DIAGNOSIS — E785 Hyperlipidemia, unspecified: Secondary | ICD-10-CM

## 2012-11-22 DIAGNOSIS — I2510 Atherosclerotic heart disease of native coronary artery without angina pectoris: Secondary | ICD-10-CM

## 2012-11-22 DIAGNOSIS — I509 Heart failure, unspecified: Secondary | ICD-10-CM

## 2012-11-22 DIAGNOSIS — I38 Endocarditis, valve unspecified: Secondary | ICD-10-CM

## 2012-11-22 DIAGNOSIS — I779 Disorder of arteries and arterioles, unspecified: Secondary | ICD-10-CM

## 2012-11-22 DIAGNOSIS — I25810 Atherosclerosis of coronary artery bypass graft(s) without angina pectoris: Secondary | ICD-10-CM

## 2012-11-22 MED ORDER — FUROSEMIDE 20 MG PO TABS: 20.0000 mg | ORAL_TABLET | Freq: Every day | ORAL | Status: DC

## 2012-11-22 MED ORDER — POTASSIUM CHLORIDE ER 10 MEQ PO TBCR: 10.0000 meq | EXTENDED_RELEASE_TABLET | Freq: Every day | ORAL | Status: DC

## 2012-11-22 NOTE — Patient Instructions (Signed)
Increase lasix (furosemide) to 20mg  daily.   Take KCL(potassium) 10 mEq daily.  Take fish oil 2000mg  two times a day. You do not need a prescription for this.   Your physician recommends that you return for lab work Thursday October 30,2014--BMET/BNP.   Your physician recommends that you schedule a follow-up appointment on November 18,2014 at 9:45AM.

## 2012-11-22 NOTE — Progress Notes (Signed)
Patient ID: Heather Crawford, female   DOB: 05/22/1945, 67 y.o.   MRN: [id number] PCP: Dr. Debby Bud  67 yo with history of rheumatic fever and rheumatic valve disease presents for followup.  Patient had an episode as a child that is very consistent with rheumatic fever and has had a heart murmur since that time.  She had been followed by a cardiologist in [facility] in the past for aortic stenosis and mitral regurgitation.   Echo in 1/12 showed moderate aortic stenosis and mild to moderate mitral regurgitation.  EF was mildly decreased (45-50%) with abnormal septal motion consistent with LBBB.   Patient reported dyspnea with moderate exertion such as after walking 100 feet or walking up an incline. She also would get a tightness/pain in her neck when doing the same activities.  No chest pain.  No neck tightness at rest.  We did an adenosine myoview in 2/12 with EF 56% and evidence for ischemia and scar in the mid to apical anterior wall, septal wall, and apex.  Left heart cath was done in 3/12 showing significant RCA disease: 40% ostial, 60% mid, 95% distal.  Dr. Riley Kill attempted to intervene on the RCA but was unable to adequately seat the catheter for intervention.  Given the large territory supplied by the RCA, CVTS was consulted regarding CABG + AVR given moderate AS.  Patient had AVR with tissue valve and RIMA-RCA in 3/12. Post-op echo in 5/12 showed EF 55-60% and a well-seated bioprosthetic valve.  Echo in 9/14 again showed normal EF and stable bioprosthetic aortic valve.   Heather Crawford has been under a lot of stress.  She is trying to take care of her mother-in-law who is chronically ill.  No chest pain.  She feels like her exertional dyspnea, however, has been getting worse.  She can walk on flat ground without problems but is short of breath walking up an incline and doing housework.  She says that her daughter has noticed more dypsnea. She is now taking Lasix about three times a week on average.   Labs  (1/12): TGs 596, HDL 40, LDL 171, HCT 39, K 4.6, creatinine  Labs (2/12): K 4.8, creatinine 0.6 Labs (4/12): K 3.9, creatinine 0.8, HCT 32.8 Labs (5/12): K 3.9, creatinine 0.7, LDL 53, HDL 37 Labs (9/12): K 4, creatinine 0.7 Labs (1/13): LDL 89, HDL 42 Labs (7/13): LDL 104, HDL 42, K 4.6, creatinine 0.6 Labs (7/14): K 4.1, creatinine 0.6, LDL 101, HDL 39  Allergies (verified):  1)  ! Bactrim 2)  ! Amoxicillin 3)  ! Neomycin 4)  ! * Adhesive Tape  Past Medical History: 1. RHEUMATIC FEVER, HX OF (ICD-V12.59) 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 HEART VALVE DISEASE (ICD-424.90): Echo (1/12) with mildly dilated LV, EF 45-50% with paradoxical septal motion consistent with LBBB, moderate aortic stenosis with mean gradient 27 mmHg, trivial AI, mild to moderate mitral regurgitation with calcified mitral valve.  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 Echo (5/12): EF 55-60%, basal inferior hypokinesis, moderate diastolic dysfunction, normally functioning bioprosthetic aortic valve with mean gradient 13 mmHg, normal RV.  Echo (9/14) with EF 60-65%, bioprosthetic aortic valve with mean gradient 16 mmHg, no AI, mild-moderate MR.  4. LBBB 5. ALLERGIC RHINITIS (ICD-477.9) 6. GERD (ICD-530.81) 7. Hx of DIVERTICULITIS, ACUTE (ICD-562.11) 8. Chronic low back pain with history of back surgery 9. Appendectomy 10. Hysterectomy 11. Hyperlipidemia: Myalgias with atorvastatin and Crestor.  12. CAD: Adenosine myoview (2/12) with EF 56%, ischemia with scar in the  mid to apical anterior wall, septal wall, and apex.  LHC (3/12) with EF 55%, 40% ostial RCA, 60% mid RCA, 95% distal RCA.  Intervention attempted but unable to adequately seat catheter.  Patient had CABG-AVR in 3/12 with RIMA-RCA.    13. Carotid dopplers (3/12): no significant stenosis.  Carotid dopplers (9/14) with 40-59% bilateral ICA stenosis.         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   Current Outpatient Prescriptions  Medication Sig Dispense Refill  . aspirin 325 MG tablet Take 325 mg by mouth daily.      . carvedilol (COREG) 6.25 MG tablet TAKE 1 TABLET BY MOUTH TWICE A DAY WITH MEALS  180 tablet  3  . cetirizine (ZYRTEC) 10 MG tablet Take 10 mg by mouth daily.      . fluticasone (FLONASE) 50 MCG/ACT nasal spray Place 1 spray into the nose daily.      Marland Kitchen loratadine (CLARITIN) 10 MG tablet Take 10 mg by mouth daily.      . meclizine (ANTIVERT) 12.5 MG tablet Take 25 mg by mouth 3 (three) times daily as needed. For dizziness      . ranitidine (ZANTAC) 150 MG tablet Take 150 mg by mouth daily as needed. For reflux      . simvastatin (ZOCOR) 40 MG tablet Take 1 tablet (40 mg total) by mouth at bedtime.  30 tablet  11  . furosemide (LASIX) 20 MG tablet Take 1 tablet (20 mg total) by mouth daily. For fluid retention  30 tablet  2  . Omega-3 Fatty Acids (FISH OIL) 1000 MG CAPS 2 capsules (total 2000mg ) two times a day    0  . potassium chloride (K-DUR) 10 MEQ tablet Take 1 tablet (10 mEq total) by mouth daily.  30 tablet  3   No current facility-administered medications for this visit.    BP 146/72  Pulse 71  Ht 5\' 3"  (1.6 m)  Wt 81.647 kg (180 lb)  BMI 31.89 kg/m2  SpO2 98% General: NAD, overweight Neck: No JVD, no thyromegaly or thyroid nodule.  Lungs: Slightly decreased breath sounds right base.  CV: Nondisplaced PMI.  Heart regular S1/S2, no S3/S4, 2/6 early SEM RUSB.  No peripheral edema. Soft bilateral carotid bruit.  Normal pedal pulses.  Abdomen: Soft, nontender, no  hepatosplenomegaly, no distention.  Neurologic: Alert and oriented x 3.  Psych: Normal affect. Extremities: No clubbing or cyanosis.   Assessment/Plan  1. Aortic valve disorder Patient had moderate AS likely due to rheumatic heart disease. She had AVR with bioprosthetic valve in 3/12 since she was also having CABG. Valve was well-seated on last echo in 9/14.  2. CAD, NATIVE VESSEL Status post RIMA-RCA in 3/12. No further chest pain. She will continue ASA and Coreg.   3. Diastolic CHF, chronic  NYHA class III. She appears to have at least mild volume overload.   - Change Lasix to 20 mg daily with KCl 10 mEq daily.  - BMET/BNP in 10  days.   4. HYPERLIPIDEMIA-MIXED  Myalgias with atorvastatin and Crestor.  She is on simvastatin.  LDL is not ideal but there is not much room to adjust statin.  Will asked her to watch her diet more carefully.  5. Carotid bruit Patient will need repeat carotid US in 9/15.   I would like her to get more exercise => she will try walking at the mall in [place].  Marca Ancona 11/22/2012

## 2012-11-23 ENCOUNTER — Encounter (INDEPENDENT_AMBULATORY_CARE_PROVIDER_SITE_OTHER): Payer: Self-pay

## 2012-12-01 ENCOUNTER — Other Ambulatory Visit (INDEPENDENT_AMBULATORY_CARE_PROVIDER_SITE_OTHER): Payer: Medicare Other

## 2012-12-01 DIAGNOSIS — I25810 Atherosclerosis of coronary artery bypass graft(s) without angina pectoris: Secondary | ICD-10-CM

## 2012-12-01 DIAGNOSIS — I5032 Chronic diastolic (congestive) heart failure: Secondary | ICD-10-CM

## 2012-12-01 LAB — BASIC METABOLIC PANEL
Calcium: 8.8 mg/dL (ref 8.4–10.5)
Creatinine, Ser: 0.8 mg/dL (ref 0.4–1.2)
GFR: 77.75 mL/min (ref >60.00)
Glucose, Bld: 147 mg/dL — ABNORMAL HIGH (ref 70–99)
Sodium: 138 mEq/L (ref 135–145)

## 2012-12-08 ENCOUNTER — Other Ambulatory Visit: Payer: Self-pay

## 2012-12-19 ENCOUNTER — Other Ambulatory Visit: Payer: Self-pay | Admitting: Cardiology

## 2012-12-20 ENCOUNTER — Encounter: Payer: Self-pay | Admitting: Cardiology

## 2012-12-20 ENCOUNTER — Ambulatory Visit (INDEPENDENT_AMBULATORY_CARE_PROVIDER_SITE_OTHER): Payer: Medicare Other | Admitting: Cardiology

## 2012-12-20 VITALS — BP 142/76 | HR 62 | Ht 63.0 in | Wt 180.0 lb

## 2012-12-20 DIAGNOSIS — I509 Heart failure, unspecified: Secondary | ICD-10-CM

## 2012-12-20 DIAGNOSIS — I38 Endocarditis, valve unspecified: Secondary | ICD-10-CM

## 2012-12-20 DIAGNOSIS — I2510 Atherosclerotic heart disease of native coronary artery without angina pectoris: Secondary | ICD-10-CM

## 2012-12-20 DIAGNOSIS — E785 Hyperlipidemia, unspecified: Secondary | ICD-10-CM

## 2012-12-20 DIAGNOSIS — I25810 Atherosclerosis of coronary artery bypass graft(s) without angina pectoris: Secondary | ICD-10-CM

## 2012-12-20 DIAGNOSIS — I5032 Chronic diastolic (congestive) heart failure: Secondary | ICD-10-CM

## 2012-12-20 MED ORDER — SIMVASTATIN 40 MG PO TABS: 40.0000 mg | ORAL_TABLET | Freq: Every day | ORAL | Status: DC

## 2012-12-20 MED ORDER — CARVEDILOL 6.25 MG PO TABS: ORAL_TABLET | ORAL | Status: DC

## 2012-12-20 MED ORDER — FUROSEMIDE 20 MG PO TABS: 20.0000 mg | ORAL_TABLET | Freq: Every day | ORAL | Status: DC

## 2012-12-20 MED ORDER — POTASSIUM CHLORIDE ER 10 MEQ PO TBCR: 10.0000 meq | EXTENDED_RELEASE_TABLET | Freq: Every day | ORAL | Status: DC

## 2012-12-20 NOTE — Progress Notes (Signed)
Patient ID: Heather Crawford, female   DOB: [birth year], 67 y.o.   MRN: [id number] PCP: Dr. Debby Bud  67 yo with history of rheumatic fever and rheumatic valve disease presents for followup.  Patient had an episode as a child that is very consistent with rheumatic fever and has had a heart murmur since that time.  She had been followed by a cardiologist in [facility] in the past for aortic stenosis and mitral regurgitation.   Echo in 1/12 showed moderate aortic stenosis and mild to moderate mitral regurgitation.  EF was mildly decreased (45-50%) with abnormal septal motion consistent with LBBB.   Patient reported dyspnea with moderate exertion such as after walking 100 feet or walking up an incline. She also would get a tightness/pain in her neck when doing the same activities.  No chest pain.  No neck tightness at rest.  We did an adenosine myoview in 2/12 with EF 56% and evidence for ischemia and scar in the mid to apical anterior wall, septal wall, and apex.  Left heart cath was done in 3/12 showing significant RCA disease: 40% ostial, 60% mid, 95% distal.  Dr. Riley Kill attempted to intervene on the RCA but was unable to adequately seat the catheter for intervention.  Given the large territory supplied by the RCA, CVTS was consulted regarding CABG + AVR given moderate AS.  Patient had AVR with tissue valve and RIMA-RCA in 3/12. Post-op echo in 5/12 showed EF 55-60% and a well-seated bioprosthetic valve.  Echo in 9/14 again showed normal EF and stable bioprosthetic aortic valve.   At last appointment, Heather Crawford report increased exertional dyspnea and did appear volume overloaded on exam.  I had her start Lasix 20 mg daily.  She feels better now.  She is able to walk up a hill to her mailbox (200 yards) with minimal dyspnea.  She can walk around the mall without dyspnea.  She continues to be under a lot of stress taking care of her mother-in-law.  SBP running 120s-130s at home.  Mildly elevated today.   Labs  (1/12): TGs 596, HDL 40, LDL 171, HCT 39, K 4.6, creatinine  Labs (2/12): K 4.8, creatinine 0.6 Labs (4/12): K 3.9, creatinine 0.8, HCT 32.8 Labs (5/12): K 3.9, creatinine 0.7, LDL 53, HDL 37 Labs (9/12): K 4, creatinine 0.7 Labs (1/13): LDL 89, HDL 42 Labs (7/13): LDL 104, HDL 42, K 4.6, creatinine 0.6 Labs (7/14): K 4.1, creatinine 0.6, LDL 101, HDL 39 Labs (10/14): K 4.5, creatinine 0.8, BNP 48  ECG: NSR, LBBB  Allergies (verified):  1)  ! Bactrim 2)  ! Amoxicillin 3)  ! Neomycin 4)  ! * Adhesive Tape  Past Medical History: 1. RHEUMATIC FEVER, HX OF (ICD-V12.59) 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 HEART VALVE DISEASE (ICD-424.90): Echo (1/12) with mildly dilated LV, EF 45-50% with paradoxical septal motion consistent with LBBB, moderate aortic stenosis with mean gradient 27 mmHg, trivial AI, mild to moderate mitral regurgitation with calcified mitral valve.  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 Echo (5/12): EF 55-60%, basal inferior hypokinesis, moderate diastolic dysfunction, normally functioning bioprosthetic aortic valve with mean gradient 13 mmHg, normal RV.  Echo (9/14) with EF 60-65%, bioprosthetic aortic valve with mean gradient 16 mmHg, no AI, mild-moderate MR.  4. LBBB 5. ALLERGIC RHINITIS (ICD-477.9) 6. GERD (ICD-530.81) 7. Hx of DIVERTICULITIS, ACUTE (ICD-562.11) 8. Chronic low back pain with history of back surgery 9. Appendectomy 10. Hysterectomy 11. Hyperlipidemia: Myalgias with atorvastatin and Crestor.  12.  CAD: Adenosine myoview (2/12) with EF 56%, ischemia with scar in the mid to apical anterior wall, septal wall, and apex.  LHC (3/12) with EF 55%, 40% ostial RCA, 60% mid RCA, 95% distal RCA.  Intervention attempted but unable to adequately seat catheter.  Patient had CABG-AVR in 3/12 with RIMA-RCA.    13. Carotid dopplers (3/12): no significant stenosis.  Carotid dopplers (9/14) with 40-59% bilateral ICA stenosis.         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 Now full time care giver Lives in [place], grew up in [place]  Family History: Father - deceased @ 38: CAD/MI, CABG at 31 Mother - deceased @ 58: Uterine cancer, DM, HTN, hypothyroid, breast cancer Sister - breast cancer Aunt - colon cancer  Review of Systems        All systems reviewed and negative except as per HPI.   Current Outpatient Prescriptions  Medication Sig Dispense Refill  . aspirin 325 MG tablet Take 325 mg by mouth daily.      . carvedilol (COREG) 6.25 MG tablet TAKE 1 TABLET BY MOUTH TWICE A DAY WITH MEALS  180 tablet  2  . cetirizine (ZYRTEC) 10 MG tablet Take 10 mg by mouth daily.      . fluticasone (FLONASE) 50 MCG/ACT nasal spray Place 1 spray into the nose daily.      . furosemide (LASIX) 20 MG tablet Take 1 tablet (20 mg total) by mouth daily. For fluid retention  90 tablet  2  . loratadine (CLARITIN) 10 MG tablet Take 10 mg by mouth daily.      . meclizine (ANTIVERT) 12.5 MG tablet Take 25 mg by mouth 3 (three) times daily as needed. For dizziness      . Omega-3 Fatty Acids (FISH OIL) 1000 MG CAPS 2 capsules (total 2000mg ) two times a day    0  . potassium chloride (K-DUR) 10 MEQ tablet Take 1 tablet (10 mEq total) by mouth daily.  90 tablet  2  . ranitidine (ZANTAC) 150 MG tablet Take 150 mg by mouth daily as needed. For reflux      . simvastatin (ZOCOR) 40 MG tablet Take 1 tablet (40 mg total) by mouth at bedtime.  90 tablet  2   No current facility-administered medications for this visit.    BP 142/76  Pulse 62  Ht 5\' 3"  (1.6 m)  Wt 180 lb (81.647 kg)  BMI 31.89 kg/m2  SpO2 95% General: NAD, overweight Neck: No JVD, no thyromegaly or thyroid nodule.  Lungs: Slightly decreased breath sounds right base.  CV: Nondisplaced PMI.  Heart regular S1/S2, no S3/S4, 2/6 early SEM RUSB.  No peripheral edema. Soft bilateral carotid  bruit.  Normal pedal pulses.  Abdomen: Soft, nontender, no hepatosplenomegaly, no distention.  Neurologic: Alert and oriented x 3.  Psych: Normal affect. Extremities: No clubbing or cyanosis.   Assessment/Plan  1. Aortic valve disorder Patient had moderate AS likely due to rheumatic heart disease. She had AVR with bioprosthetic valve in 3/12 since she was also having CABG. Valve was well-seated on last echo in 9/14.  2. CAD, NATIVE VESSEL Status post RIMA-RCA in 3/12. No further chest pain. She will continue ASA and Coreg.   3. Diastolic CHF, chronic  NYHA class II symptoms, improved.  She does not appear volume overloaded.  Continue current Lasix and KCl.  4. HYPERLIPIDEMIA-MIXED  Myalgias with atorvastatin and Crestor.  She is on simvastatin.  LDL is not ideal but there is not much room to adjust statin.  Asked her to watch her diet more carefully.  5. Carotid bruit Patient will need repeat carotid US in 9/15.   Followup in 6 months.   Marca Ancona 12/20/2012

## 2012-12-20 NOTE — Patient Instructions (Signed)
[facility] patient accounting phone number--[phone].  Your physician wants you to follow-up in: 6 months with Dr Shirlee Latch. (May 2015). You will receive a reminder letter in the mail two months in advance. If you don't receive a letter, please call our office to schedule the follow-up appointment.

## 2012-12-23 ENCOUNTER — Other Ambulatory Visit: Payer: Self-pay | Admitting: Internal Medicine

## 2013-01-16 ENCOUNTER — Telehealth: Payer: Self-pay

## 2013-01-16 DIAGNOSIS — Z1211 Encounter for screening for malignant neoplasm of colon: Secondary | ICD-10-CM

## 2013-01-16 NOTE — Telephone Encounter (Signed)
Request sent to [facility]

## 2013-01-16 NOTE — Telephone Encounter (Signed)
Phone call from patient [phone] stating she needs a referral to Dr Loman Chroman at [facility] for a colonoscopy since her insurance is changing. It is currently scheduled for January 8. If she does not get a referral she will need to cancel the colonoscopy.

## 2013-03-20 ENCOUNTER — Ambulatory Visit (INDEPENDENT_AMBULATORY_CARE_PROVIDER_SITE_OTHER): Payer: Medicare HMO | Admitting: Internal Medicine

## 2013-03-20 ENCOUNTER — Encounter: Payer: Self-pay | Admitting: Internal Medicine

## 2013-03-20 VITALS — BP 150/90 | HR 67 | Wt 173.8 lb

## 2013-03-20 DIAGNOSIS — H8309 Labyrinthitis, unspecified ear: Secondary | ICD-10-CM

## 2013-03-20 DIAGNOSIS — R110 Nausea: Secondary | ICD-10-CM

## 2013-03-20 MED ORDER — MECLIZINE HCL 25 MG PO TABS: 25.0000 mg | ORAL_TABLET | Freq: Three times a day (TID) | ORAL | Status: DC

## 2013-03-20 MED ORDER — PROMETHAZINE HCL 25 MG/ML IJ SOLN
25.0000 mg | Freq: Once | INTRAMUSCULAR | Status: AC
  Administered 2013-03-20: 25 mg via INTRAMUSCULAR

## 2013-03-20 NOTE — Patient Instructions (Signed)
Acute labyrinthitis - no evidence to suggest cardiac origin, no evidence of a stroke like event. This appears to be acute inner ear attack with dizziness and vomiting  Plan Antivert 25 mg 1/2 or 1 tablet every 6 hours  PHenergan 12.5 mg every 6 hours  Alarm signs: inability to keep down fluids, chest pain, weakness in one are or one leg or loss of sensation; facial droop - will need to go to the ED.  Labyrinthitis (Inner Ear Inflammation) Your exam shows you have an inner ear disturbance or labyrinthitis. The cause of this condition is not known. But it may be due to a virus infection. The symptoms of labyrinthitis include vertigo or dizziness made worse by motion, nausea and vomiting. The onset of labyrinthitis may be very sudden. It usually lasts for a few days and then clears up over 1-2 weeks. The treatment of an inner ear disturbance includes bed rest and medications to reduce dizziness, nausea, and vomiting. You should stay away from alcohol, tranquilizers, caffeine, nicotine, or any medicine your doctor thinks may make your symptoms worse. Further testing may be needed to evaluate your hearing and balance system. Please see your doctor or go to the emergency room right away if you have:  Increasing vertigo, earache, loss of hearing, or ear drainage.  Headache, blurred vision, trouble walking, fainting, or fever.  Persistent vomiting, dehydration, or extreme weakness. Document Released: 01/19/2005 Document Revised: 04/13/2011 Document Reviewed: 07/07/2006 [facility] Patient Information 2014 [place].

## 2013-03-20 NOTE — Progress Notes (Signed)
Subjective:    Patient ID: Heather Crawford, female    DOB: 01-05-1946, 68 y.o.   MRN: [id number]  HPI Heather Crawford was feeling OK yesterday. She awoke about 4 AM to go to the bathroom. She was very dizzy, she was diaphoretic, she was not short of breath, developed diarrhea, had emesis repeatedly until she had "dry heaves." No headache, no paresthesia, no focal weakness, she does have blurred vision and feels very weak. Her abdominal wall is sore from repeated vomiting.  She had been diagnosed with pneumonia at [facility] treated with one round of azithromycin and then clarithromycin.  She did have a lot of wheezing which did respond to ProAir and Qvar  Past Medical History  Diagnosis Date  . History of rheumatic fever   . 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 Aortic stenosis     a.  echo 1/12 w/mildly dilated LV, EF 45-50% w/paradoxical septal motion consistent w/ LBBB, moderate aortic stenosis w/mean gradient 27 mmHg, trivial AI, mild to moderate MR w/calcified mitral valve;   b. s/p AVR with 21 mm Magna Ease valve  . LBBB (left bundle branch block)   . Allergic rhinitis, cause unspecified   . Esophageal reflux   . Diverticulitis of colon (without mention of hemorrhage)   . Hyperlipidemia   . Hx of hysterectomy   . Chronic back pain     H/O BACK SURGERY  . Glucose intolerance (impaired glucose tolerance)     A1C 6.3 (4/12)  . Heart murmur   . Aortic valve replaced 2012  . Cancer     skin sarcomas removed  . Hx: UTI (urinary tract infection)   . Myocardial infarction 2002  . Coronary artery disease     adenosine myoview 2/12 w/EF 56%, ISCHEMIA W/SCAR IN THE MID TO APICAL ANTERIO WALL, SEPTAL WALL, AND APEX. LHC 3/12 W/EF 55%, 40% OSTIAL RCA, 60% MID RCA, 95% DISTAL RC A, INTERVENTION  ATTEMPTED BUT UNABLE TO ADEQUADATELY SEAT  CATHETER.  Marland Kitchen CAD (coronary artery disease)     a. s/p CABG 4/12: RIMA-RCA  . CAD, multiple vessel     sees Dr Aundra Dubin every 3-4 months  . H/O  vertigo     with fluid  in ears  . PONV (postoperative nausea and vomiting)   . Urinary frequency   . Hemorrhoids   . Snoring   . H/O hiatal hernia   . Arthritis     joint pain  . CHF (congestive heart failure)     chronic diastolic CHF   Past Surgical History  Procedure Laterality Date  . Appendectomy  1973  . Tonsillectomy      1955  . Lumbar disc surgery      DR. HUSSE[place]  . Abdominal hysterectomy    . Dilation and curettage of uterus    . Laparoscopic lysis intestinal adhesions    . Endometrial ablation    . Cardiac catheterization  2012  . Coronary artery bypass graft  2012  . Cardiac valve replacement  2012    Aortic  . Tonsillectomy    . Back surgery      Lumbar Lam and discectomy x 2  . Breast surgery  10/2011    Breast biopsy   Family History  Problem Relation Age of Onset  . Hypertension Mother   . Cancer Mother     BREAST  . Hypothyroidism Mother   . Diabetes type II Mother   .  Heart disease Mother   . Heart attack Father     CAD/MI, CABG @ 53  . Cancer Sister     BREAST  . Cancer      COLON CANCER, AUNT  . Cancer Maternal Aunt     breast  . Cancer Paternal Uncle     colon   History   Social History  . Marital Status: Married    Spouse Name: N/A    Number of Children: 2  . Years of Education: N/A   Occupational History  .      [employer], [employer], CERTIFIED AS SPECIAL ED TEACHER   Social History Main Topics  . Smoking status: Former Smoker -- 1.00 packs/day for 40 years    Types: Cigarettes    Quit date: 02/03/2000  . Smokeless tobacco: Former Systems developer    Types: Chew  . Alcohol Use: Yes  . Drug Use: No  . Sexual Activity: Not on file   Other Topics Concern  . Not on file   Social History Narrative   MARRIED   1 SON 1 DAUGHTER 2 GRANDCHILDREN   Music therapist, NOW FULL TIME CAREGIVER   LIVES IN [place], GREW UP IN [place].    Current Outpatient Prescriptions on File Prior to Visit  Medication Sig Dispense Refill   . aspirin 325 MG tablet Take 325 mg by mouth daily.      . carvedilol (COREG) 6.25 MG tablet TAKE 1 TABLET BY MOUTH TWICE A DAY WITH MEALS  180 tablet  2  . cetirizine (ZYRTEC) 10 MG tablet Take 10 mg by mouth daily.      . fluticasone (FLONASE) 50 MCG/ACT nasal spray Place 1 spray into the nose daily.      . fluticasone (FLONASE) 50 MCG/ACT nasal spray INSTILL 1 SPRAY IN EACH NOSTRIL ONCE DAILY  16 g  5  . furosemide (LASIX) 20 MG tablet Take 1 tablet (20 mg total) by mouth daily. For fluid retention  90 tablet  2  . loratadine (CLARITIN) 10 MG tablet Take 10 mg by mouth daily.      . meclizine (ANTIVERT) 12.5 MG tablet Take 25 mg by mouth 3 (three) times daily as needed. For dizziness      . Omega-3 Fatty Acids (FISH OIL) 1000 MG CAPS 2 capsules (total 2000mg ) two times a day    0  . potassium chloride (K-DUR) 10 MEQ tablet Take 1 tablet (10 mEq total) by mouth daily.  90 tablet  2  . ranitidine (ZANTAC) 150 MG tablet Take 150 mg by mouth daily as needed. For reflux      . simvastatin (ZOCOR) 40 MG tablet Take 1 tablet (40 mg total) by mouth at bedtime.  90 tablet  2   No current facility-administered medications on file prior to visit.      Review of Systems System review is negative for any constitutional, cardiac, pulmonary, GI or neuro symptoms or complaints other than as described in the HPI.     Objective:   Physical Exam Filed Vitals:   03/20/13 1201  BP: 150/90  Pulse: 67   Supine 160/.86 HR 59  Sitting 160/90 HR 69  HEENT- [place]/AT, TMs normal Cor 2+ radial, RRR Pulm - CTAP Neuro - awake, speech clear, cognition normal, CN II-XII - nl facial symmetry, PERRLA, EOMI, no nystagmus, no deviation or fasciculations of the tongue, MS - normal grip strength.       Assessment & Plan:  Acute labyrinthitis - no evidence to  suggest cardiac origin, no evidence of a stroke like event. This appears to be acute inner ear attack with dizziness and vomiting  Plan Antivert 25 mg 1/2  or 1 tablet every 6 hours  PHenergan 12.5 mg every 6 hours  Alarm signs: inability to keep down fluids, chest pain, weakness in one are or one leg or loss of sensation; facial droop - will need to go to the ED.

## 2013-03-20 NOTE — Progress Notes (Signed)
Pre visit review using our clinic review tool, if applicable. No additional management support is needed unless otherwise documented below in the visit note. 

## 2013-03-22 ENCOUNTER — Telehealth: Payer: Self-pay | Admitting: *Deleted

## 2013-03-22 NOTE — Telephone Encounter (Signed)
[facility] Triage Call Report Triage Record Num: [id number] Operator: Feliberto Harts Patient Name: Heather Crawford Call Date & Time: 03/20/2013 8:31:22PM Patient Phone: [phone] PCP: Patient Gender: Female PCP Fax : Patient DOB: 07/27/1948 [facility] Name[facility] Reason for Call: Caller: Adelfa Koh; PCP: Adella Hare (Adults only); CB#: [phone]; Call regarding Medication Issue; Medication(s): Phenergan (not received at pharm); Pharmacy getting ready to close. Wants Phenergan called to [facility] in [place] @ [phone]. Seen by Dr. Linda Hedges today in office for inner ear/vertigo. Has been throwing up since 04:00 03/20/2013. Prescription in EMR as Phenergan 12.5 mg Take one PO Q 6 hrs PRN per Moshe Salisbury RN. No # given. Dr. Ronnald Ramp on call and called to verify Prescription. Dr. Ronnald Ramp stated to give #20. Called to [facility] @ [phone]. Note to office. Protocol(s) Used: Office Note Recommended Outcome per Protocol: Information Noted and Sent to Office Reason for Outcome: Caller information to office Care Advice: ~

## 2013-03-29 ENCOUNTER — Other Ambulatory Visit: Payer: Self-pay

## 2013-03-29 MED ORDER — FLUTICASONE PROPIONATE 50 MCG/ACT NA SUSP: NASAL | Status: DC

## 2013-03-29 MED ORDER — MECLIZINE HCL 25 MG PO TABS: 25.0000 mg | ORAL_TABLET | Freq: Three times a day (TID) | ORAL | Status: DC

## 2013-04-03 ENCOUNTER — Other Ambulatory Visit: Payer: Self-pay

## 2013-04-03 DIAGNOSIS — E785 Hyperlipidemia, unspecified: Secondary | ICD-10-CM

## 2013-04-03 DIAGNOSIS — I5032 Chronic diastolic (congestive) heart failure: Secondary | ICD-10-CM

## 2013-04-03 DIAGNOSIS — I25810 Atherosclerosis of coronary artery bypass graft(s) without angina pectoris: Secondary | ICD-10-CM

## 2013-04-03 DIAGNOSIS — I2510 Atherosclerotic heart disease of native coronary artery without angina pectoris: Secondary | ICD-10-CM

## 2013-04-03 MED ORDER — SIMVASTATIN 40 MG PO TABS: 40.0000 mg | ORAL_TABLET | Freq: Every day | ORAL | Status: DC

## 2013-04-03 MED ORDER — CARVEDILOL 6.25 MG PO TABS: ORAL_TABLET | ORAL | Status: DC

## 2013-04-03 MED ORDER — FUROSEMIDE 20 MG PO TABS: 20.0000 mg | ORAL_TABLET | Freq: Every day | ORAL | Status: DC

## 2013-04-03 MED ORDER — POTASSIUM CHLORIDE ER 10 MEQ PO TBCR: 10.0000 meq | EXTENDED_RELEASE_TABLET | Freq: Every day | ORAL | Status: DC

## 2013-06-12 ENCOUNTER — Encounter: Payer: Self-pay | Admitting: *Deleted

## 2013-06-19 ENCOUNTER — Encounter: Payer: Self-pay | Admitting: Cardiology

## 2013-06-19 ENCOUNTER — Ambulatory Visit (INDEPENDENT_AMBULATORY_CARE_PROVIDER_SITE_OTHER): Payer: Medicare HMO | Admitting: Cardiology

## 2013-06-19 VITALS — BP 140/95 | HR 66 | Ht 63.0 in | Wt 183.0 lb

## 2013-06-19 DIAGNOSIS — I779 Disorder of arteries and arterioles, unspecified: Secondary | ICD-10-CM

## 2013-06-19 DIAGNOSIS — N62 Hypertrophy of breast: Secondary | ICD-10-CM

## 2013-06-19 DIAGNOSIS — I2510 Atherosclerotic heart disease of native coronary artery without angina pectoris: Secondary | ICD-10-CM

## 2013-06-19 DIAGNOSIS — I5032 Chronic diastolic (congestive) heart failure: Secondary | ICD-10-CM

## 2013-06-19 DIAGNOSIS — N6099 Unspecified benign mammary dysplasia of unspecified breast: Secondary | ICD-10-CM

## 2013-06-19 DIAGNOSIS — I739 Peripheral vascular disease, unspecified: Secondary | ICD-10-CM

## 2013-06-19 DIAGNOSIS — I509 Heart failure, unspecified: Secondary | ICD-10-CM

## 2013-06-19 DIAGNOSIS — I38 Endocarditis, valve unspecified: Secondary | ICD-10-CM

## 2013-06-19 DIAGNOSIS — I10 Essential (primary) hypertension: Secondary | ICD-10-CM

## 2013-06-19 DIAGNOSIS — E785 Hyperlipidemia, unspecified: Secondary | ICD-10-CM

## 2013-06-19 DIAGNOSIS — R0602 Shortness of breath: Secondary | ICD-10-CM

## 2013-06-19 LAB — BASIC METABOLIC PANEL
BUN: 15 mg/dL (ref 6–23)
CALCIUM: 8.7 mg/dL (ref 8.4–10.5)
CO2: 26 mEq/L (ref 19–32)
CREATININE: 0.7 mg/dL (ref 0.4–1.2)
Chloride: 104 mEq/L (ref 96–112)
GFR: 92.51 mL/min (ref >60.00)
Glucose, Bld: 133 mg/dL — ABNORMAL HIGH (ref 70–99)
POTASSIUM: 4 meq/L (ref 3.5–5.1)
Sodium: 139 mEq/L (ref 135–145)

## 2013-06-19 LAB — LIPID PANEL
Cholesterol: 170 mg/dL (ref 0–200)
HDL: 40 mg/dL (ref >39.00)
LDL Cholesterol: 68 mg/dL (ref 0–99)
Total CHOL/HDL Ratio: 4
Triglycerides: 309 mg/dL — ABNORMAL HIGH (ref 0.0–149.0)
VLDL: 61.8 mg/dL — AB (ref 0.0–40.0)

## 2013-06-19 MED ORDER — AZITHROMYCIN 500 MG PO TABS: ORAL_TABLET | ORAL | Status: DC

## 2013-06-19 NOTE — Patient Instructions (Signed)
Your physician recommends that you continue on your current medications as directed. Please refer to the Current Medication list given to you today.  Your physician recommends that you return for lab work in: TODAY ([place] BMET)  Your physician has requested that you have a carotid duplex. This test is an ultrasound of the carotid arteries in your neck. It looks at blood flow through these arteries that supply the brain with blood. Allow one hour for this exam. There are no restrictions or special instructions.  THIS IS TO BE DONE IN September 2015  Your physician wants you to follow-up in: [place] will receive a reminder letter in the mail two months in advance. If you don't receive a letter, please call our office to schedule the follow-up appointment.

## 2013-06-19 NOTE — Progress Notes (Signed)
Patient ID: Heather Crawford, female   DOB: March 31, 1945, 68 y.o.    MRN: [id number] PCP: Dr. Ronnald Ramp  68 yo with history of rheumatic fever and rheumatic valve disease presents for followup.  Patient had an episode as a child that is very consistent with rheumatic fever and has had a heart murmur since that time.  She had been followed by a cardiologist in [facility] in the past for aortic stenosis and mitral regurgitation.   Echo in 1/12 showed moderate aortic stenosis and mild to moderate mitral regurgitation.  EF was mildly decreased (45-50%) with abnormal septal motion consistent with LBBB.   Patient reported dyspnea with moderate exertion such as after walking 100 feet or walking up an incline. She also would get a tightness/pain in her neck when doing the same activities.  No chest pain.  No neck tightness at rest.  We did an adenosine myoview in 2/12 with EF 56% and evidence for ischemia and scar in the mid to apical anterior wall, septal wall, and apex.  Left heart cath was done in 3/12 showing significant RCA disease: 40% ostial, 60% mid, 95% distal.  Dr. Lia Foyer attempted to intervene on the RCA but was unable to adequately seat the catheter for intervention.  Given the large territory supplied by the RCA, CVTS was consulted regarding CABG + AVR given moderate AS.  Patient had AVR with tissue valve and RIMA-RCA in 3/12. Post-op echo in 5/12 showed EF 55-60% and a well-seated bioprosthetic valve.  Echo in 9/14 again showed normal EF and stable bioprosthetic aortic valve.   Overall, patient is doing well today.  She has some heel pain that is limiting her walking (not able to walk for exercise for more than a couple of days in a row).  Weight is down 3 lbs.  She is short of breath walking up a hill but no problems on flat ground.  No chest pain.  Plans to travel to [place] to visit her son.    ECG: NSR, LBBB (no change)  Labs (1/12): TGs 596, HDL 40, LDL 171, HCT 39, K 4.6, creatinine  Labs (2/12): K  4.8, creatinine 0.6 Labs (4/12): K 3.9, creatinine 0.8, HCT 32.8 Labs (5/12): K 3.9, creatinine 0.7, LDL 53, HDL 37 Labs (9/12): K 4, creatinine 0.7 Labs (1/13): LDL 89, HDL 42 Labs (7/13): LDL 104, HDL 42, K 4.6, creatinine 0.6 Labs (7/14): K 4.1, creatinine 0.6, LDL 101, HDL 39 Labs (10/14): K 4.5, creatinine 0.8, BNP 48  Allergies (verified):  1)  ! Bactrim 2)  ! Amoxicillin 3)  ! Neomycin 4)  ! * Adhesive Tape  Past Medical History: 1. 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 HEART VALVE DISEASE: Echo (1/12) with mildly dilated LV, EF 45-50% with paradoxical septal motion consistent with LBBB, moderate aortic stenosis with mean gradient 27 mmHg, trivial AI, mild to moderate mitral regurgitation with calcified mitral valve.  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 Echo (5/12): EF 55-60%, basal inferior hypokinesis, moderate diastolic dysfunction, normally functioning bioprosthetic aortic valve with mean gradient 13 mmHg, normal RV.  Echo (9/14) with EF 60-65%, bioprosthetic aortic valve with mean gradient 16 mmHg, no AI, mild-moderate MR.  4. LBBB 5. ALLERGIC RHINITIS  6. GERD 7. Hx of DIVERTICULITIS 8. Chronic low back pain with history of back surgery 9. Appendectomy 10. Hysterectomy 11. Hyperlipidemia: Myalgias with atorvastatin and Crestor.  12. CAD: Adenosine myoview (2/12) with EF 56%, ischemia with scar in the mid to  apical anterior wall, septal wall, and apex.  LHC (3/12) with EF 55%, 40% ostial RCA, 60% mid RCA, 95% distal RCA.  Intervention attempted but unable to adequately seat catheter.  Patient had CABG-AVR in 3/12 with RIMA-RCA.    13. Carotid dopplers (3/12): no significant stenosis.  Carotid dopplers (9/14) with 40-59% bilateral ICA stenosis.  14. Vertigo: labyrinthitis        Social History: HS[employer], [employer] college, certified as special ed teacher married  -'50  1 son - '70; 1 dtr '69; 2 grandchildren work - Sales promotion account executive, retired. Now  full time care giver Lives in [place], grew up in [place]  Family History: Father - deceased @ 82: CAD/MI, CABG at 71 Mother - deceased @ 61: Uterine cancer, DM, HTN, hypothyroid, breast cancer Sister - breast cancer Aunt - colon cancer  Review of Systems        All systems reviewed and negative except as per HPI.   Current Outpatient Prescriptions  Medication Sig Dispense Refill  . aspirin 325 MG tablet Take 325 mg by mouth daily.      . carvedilol (COREG) 6.25 MG tablet TAKE 1 TABLET BY MOUTH TWICE A DAY WITH MEALS  180 tablet  2  . cetirizine (ZYRTEC) 10 MG tablet Take 10 mg by mouth daily.      . fluticasone (FLONASE) 50 MCG/ACT nasal spray Place 1 spray into the nose daily.      . furosemide (LASIX) 20 MG tablet Take 1 tablet (20 mg total) by mouth daily. For fluid retention  90 tablet  2  . loratadine (CLARITIN) 10 MG tablet Take 10 mg by mouth daily.      . meclizine (ANTIVERT) 25 MG tablet Take 1 tablet (25 mg total) by mouth 3 (three) times daily. May take 1/2 tablet if you wish.  270 tablet  0  . Omega-3 Fatty Acids (FISH OIL) 1000 MG CAPS 2 capsules (total 2000mg ) two times a day    0  . potassium chloride (K-DUR) 10 MEQ tablet Take 1 tablet (10 mEq total) by mouth daily.  90 tablet  2  . ranitidine (ZANTAC) 150 MG tablet Take 150 mg by mouth daily as needed. For reflux      . simvastatin (ZOCOR) 40 MG tablet Take 1 tablet (40 mg total) by mouth at bedtime.  90 tablet  2  . azithromycin (ZITHROMAX) 500 MG tablet TAKE 1 500 MG TABLET 1 HR PRIOR TO DENTAL WORK  1 tablet  0   No current facility-administered medications for this visit.    BP 140/95  Pulse 66  Ht 5\' 3"  (1.6 m)  Wt 83.008 kg (183 lb)  BMI 32.43 kg/m2 General: NAD, overweight Neck: No JVD, no thyromegaly or thyroid nodule.  Lungs: Slightly decreased breath sounds right base.  CV: Nondisplaced PMI.  Heart regular S1/S2, no S3/S4, 2/6 early SEM RUSB.  1+ ankle edema. Soft bilateral carotid bruits.   Normal pedal pulses.  Abdomen: Soft, nontender, no hepatosplenomegaly, no distention.  Neurologic: Alert and oriented x 3.  Psych: Normal affect. Extremities: No clubbing or cyanosis.   Assessment/Plan  1. Aortic valve disorder Patient had moderate AS likely due to rheumatic heart disease. She had AVR with bioprosthetic valve in 3/12 since she was also having CABG. Valve was well-seated on last echo in 9/14.  2. CAD, NATIVE VESSEL Status post RIMA-RCA in 3/12. No further chest pain. She will continue ASA, statin, and Coreg.   3. Diastolic CHF, chronic  NYHA  class II symptoms, improved.  She does not appear volume overloaded.  Continue current Lasix and KCl. BMET today.  4. HYPERLIPIDEMIA-MIXED  Myalgias with atorvastatin and Crestor.  She is on simvastatin.  LDL is not ideal but there is not much room to adjust statin.  Will check lipids today.   5. Carotid bruit Patient will need repeat carotid US in 9/15.   Followup in 6 months.   Larey Dresser 06/19/2013

## 2013-06-23 ENCOUNTER — Telehealth: Payer: Self-pay | Admitting: Cardiology

## 2013-06-23 NOTE — Telephone Encounter (Signed)
New message         Office sent over fax for pt to get cleared for a tooth extraction. Pt is scheduled for Tuesday May 26th. Office would like for you to fax the paper to [phone] asap. The office is currently closed.

## 2013-06-27 NOTE — Telephone Encounter (Signed)
Patient is scheduled at 10:45am. This is urgent. Please call Bryna Colander @ [phone] and advise ASAP!

## 2013-06-28 ENCOUNTER — Telehealth: Payer: Self-pay | Admitting: Cardiology

## 2013-06-28 NOTE — Telephone Encounter (Signed)
Received request from Nurse fax box, documents faxed for surgical clearance. To: [facility] Fax number: [phone] Attention: [phone]/km

## 2013-06-28 NOTE — Telephone Encounter (Signed)
Elmyra Ricks advised OK to proceed with dental procedure and antibiotic prophylaxis is required. Will forward Medical Consultation Form completed by Dr Aundra Dubin  from Dr Owens Shark and [facility] to HIM to be faxed.

## 2013-08-16 ENCOUNTER — Encounter: Payer: Self-pay | Admitting: Internal Medicine

## 2013-08-16 ENCOUNTER — Ambulatory Visit (INDEPENDENT_AMBULATORY_CARE_PROVIDER_SITE_OTHER): Payer: Medicare HMO | Admitting: Internal Medicine

## 2013-08-16 ENCOUNTER — Other Ambulatory Visit (INDEPENDENT_AMBULATORY_CARE_PROVIDER_SITE_OTHER): Payer: Medicare HMO

## 2013-08-16 VITALS — BP 124/68 | HR 64 | Temp 97.8°F | Resp 16 | Ht 63.0 in | Wt 176.0 lb

## 2013-08-16 DIAGNOSIS — E1165 Type 2 diabetes mellitus with hyperglycemia: Secondary | ICD-10-CM

## 2013-08-16 DIAGNOSIS — E785 Hyperlipidemia, unspecified: Secondary | ICD-10-CM | POA: Insufficient documentation

## 2013-08-16 DIAGNOSIS — R7309 Other abnormal glucose: Secondary | ICD-10-CM

## 2013-08-16 DIAGNOSIS — Z0000 Encounter for general adult medical examination without abnormal findings: Secondary | ICD-10-CM

## 2013-08-16 DIAGNOSIS — M899 Disorder of bone, unspecified: Secondary | ICD-10-CM

## 2013-08-16 DIAGNOSIS — N6099 Unspecified benign mammary dysplasia of unspecified breast: Secondary | ICD-10-CM

## 2013-08-16 DIAGNOSIS — J309 Allergic rhinitis, unspecified: Secondary | ICD-10-CM

## 2013-08-16 DIAGNOSIS — M949 Disorder of cartilage, unspecified: Secondary | ICD-10-CM

## 2013-08-16 DIAGNOSIS — K219 Gastro-esophageal reflux disease without esophagitis: Secondary | ICD-10-CM

## 2013-08-16 DIAGNOSIS — Z23 Encounter for immunization: Secondary | ICD-10-CM

## 2013-08-16 DIAGNOSIS — I10 Essential (primary) hypertension: Secondary | ICD-10-CM

## 2013-08-16 DIAGNOSIS — I38 Endocarditis, valve unspecified: Secondary | ICD-10-CM

## 2013-08-16 DIAGNOSIS — M8580 Other specified disorders of bone density and structure, unspecified site: Secondary | ICD-10-CM

## 2013-08-16 DIAGNOSIS — I2510 Atherosclerotic heart disease of native coronary artery without angina pectoris: Secondary | ICD-10-CM

## 2013-08-16 DIAGNOSIS — E118 Type 2 diabetes mellitus with unspecified complications: Secondary | ICD-10-CM | POA: Insufficient documentation

## 2013-08-16 DIAGNOSIS — E781 Pure hyperglyceridemia: Secondary | ICD-10-CM | POA: Insufficient documentation

## 2013-08-16 DIAGNOSIS — N62 Hypertrophy of breast: Secondary | ICD-10-CM

## 2013-08-16 DIAGNOSIS — M722 Plantar fascial fibromatosis: Secondary | ICD-10-CM | POA: Insufficient documentation

## 2013-08-16 DIAGNOSIS — IMO0001 Reserved for inherently not codable concepts without codable children: Secondary | ICD-10-CM

## 2013-08-16 LAB — CBC WITH DIFFERENTIAL/PLATELET
BASOS PCT: 0.7 % (ref 0.0–3.0)
Basophils Absolute: 0 10*3/uL (ref 0.0–0.1)
EOS PCT: 1.3 % (ref 0.0–5.0)
Eosinophils Absolute: 0.1 10*3/uL (ref 0.0–0.7)
HEMATOCRIT: 37.3 % (ref 36.0–46.0)
Hemoglobin: 12.3 g/dL (ref 12.0–15.0)
LYMPHS ABS: 1.8 10*3/uL (ref 0.7–4.0)
Lymphocytes Relative: 32.6 % (ref 12.0–46.0)
MCHC: 33.1 g/dL (ref 30.0–36.0)
MCV: 90.2 fl (ref 78.0–100.0)
MONOS PCT: 7.5 % (ref 3.0–12.0)
Monocytes Absolute: 0.4 10*3/uL (ref 0.1–1.0)
Neutro Abs: 3.1 10*3/uL (ref 1.4–7.7)
Neutrophils Relative %: 57.9 % (ref 43.0–77.0)
PLATELETS: 218 10*3/uL (ref 150.0–400.0)
RBC: 4.13 Mil/uL (ref 3.87–5.11)
RDW: 12.5 % (ref 11.5–15.5)
WBC: 5.4 10*3/uL (ref 4.0–10.5)

## 2013-08-16 LAB — LIPID PANEL
CHOLESTEROL: 156 mg/dL (ref 0–200)
HDL: 41.5 mg/dL (ref >39.00)
LDL Cholesterol: 58 mg/dL (ref 0–99)
NONHDL: 114.5
Total CHOL/HDL Ratio: 4
Triglycerides: 282 mg/dL — ABNORMAL HIGH (ref 0.0–149.0)
VLDL: 56.4 mg/dL — ABNORMAL HIGH (ref 0.0–40.0)

## 2013-08-16 LAB — COMPREHENSIVE METABOLIC PANEL
ALK PHOS: 45 U/L (ref 39–117)
ALT: 16 U/L (ref 0–35)
AST: 18 U/L (ref 0–37)
Albumin: 3.9 g/dL (ref 3.5–5.2)
BILIRUBIN TOTAL: 0.5 mg/dL (ref 0.2–1.2)
BUN: 13 mg/dL (ref 6–23)
CO2: 28 mEq/L (ref 19–32)
CREATININE: 0.6 mg/dL (ref 0.4–1.2)
Calcium: 9 mg/dL (ref 8.4–10.5)
Chloride: 104 mEq/L (ref 96–112)
GFR: 101.13 mL/min (ref >60.00)
GLUCOSE: 138 mg/dL — AB (ref 70–99)
Potassium: 4.3 mEq/L (ref 3.5–5.1)
SODIUM: 139 meq/L (ref 135–145)
TOTAL PROTEIN: 6.9 g/dL (ref 6.0–8.3)

## 2013-08-16 LAB — TSH: TSH: 5 u[IU]/mL — ABNORMAL HIGH (ref 0.35–4.50)

## 2013-08-16 LAB — HEMOGLOBIN A1C: Hgb A1c MFr Bld: 7.1 % — ABNORMAL HIGH (ref 4.6–6.5)

## 2013-08-16 NOTE — Assessment & Plan Note (Signed)
I will check her A1C today to see if she has developed DM2

## 2013-08-16 NOTE — Assessment & Plan Note (Addendum)

## 2013-08-16 NOTE — Addendum Note (Signed)
Addended by: Janith Lima on: 08/16/2013 11:46 AM   Modules accepted: Orders

## 2013-08-16 NOTE — Patient Instructions (Signed)

## 2013-08-16 NOTE — Assessment & Plan Note (Signed)
I will recheck her trigs today

## 2013-08-16 NOTE — Assessment & Plan Note (Signed)
I have asked her to see a podiatrist about this

## 2013-08-16 NOTE — Assessment & Plan Note (Signed)
She is due for a f/up DEXA scan 

## 2013-08-16 NOTE — Assessment & Plan Note (Signed)
Her BP is well controlled 

## 2013-08-16 NOTE — Progress Notes (Signed)
Subjective:    Patient ID: Heather Crawford, female    DOB: 10-Dec-1945, 68 y.o.   MRN: [id number]  HPI    Review of Systems  Constitutional: Negative.  Negative for fever, chills, diaphoresis, activity change, appetite change, fatigue and unexpected weight change.  HENT: Negative.   Eyes: Negative.   Respiratory: Negative.  Negative for cough, choking, chest tightness, shortness of breath and stridor.   Cardiovascular: Negative.  Negative for chest pain, palpitations and leg swelling.  Gastrointestinal: Negative.  Negative for nausea, vomiting, abdominal pain, diarrhea, constipation and blood in stool.  Endocrine: Negative.   Genitourinary: Negative.   Musculoskeletal: Positive for arthralgias. Negative for back pain, gait problem, joint swelling, myalgias, neck pain and neck stiffness.       Bilateral foot pain for 3 years  Skin: Negative.  Negative for rash.  Allergic/Immunologic: Negative.   Neurological: Negative.  Negative for dizziness, tremors, seizures, syncope, facial asymmetry, speech difficulty, weakness, light-headedness, numbness and headaches.  Hematological: Negative.  Negative for adenopathy. Does not bruise/bleed easily.  Psychiatric/Behavioral: Negative.        Objective:   Physical Exam  Vitals reviewed. Constitutional: She is oriented to person, place, and time. She appears well-developed and well-nourished. No distress.  HENT:  Head: Normocephalic and atraumatic.  Mouth/Throat: Oropharynx is clear and moist. No oropharyngeal exudate.  Eyes: Conjunctivae are normal. Right eye exhibits no discharge. Left eye exhibits no discharge. No scleral icterus.  Neck: Normal range of motion. Neck supple. No JVD present. No tracheal deviation present. No thyromegaly present.  Cardiovascular: Normal rate, regular rhythm, S1 normal, S2 normal and intact distal pulses.  Exam reveals no gallop, no S3, no S4 and no friction rub.   Murmur heard.  Systolic murmur is present  with a grade of 1/6   Decrescendo diastolic murmur is present  Pulses:      Carotid pulses are 1+ on the right side, and 1+ on the left side.      Radial pulses are 1+ on the right side, and 1+ on the left side.       Femoral pulses are 1+ on the right side, and 1+ on the left side.      Popliteal pulses are 1+ on the right side, and 1+ on the left side.       Dorsalis pedis pulses are 1+ on the right side, and 1+ on the left side.       Posterior tibial pulses are 1+ on the right side, and 1+ on the left side.  Pulmonary/Chest: Effort normal and breath sounds normal. No stridor. No respiratory distress. She has no wheezes. She has no rales. She exhibits no tenderness.  Abdominal: Soft. Bowel sounds are normal. She exhibits no distension and no mass. There is no tenderness. There is no rebound and no guarding.  Musculoskeletal: Normal range of motion. She exhibits no edema and no tenderness.       Right foot: Normal.       Left foot: Normal.  Lymphadenopathy:    She has no cervical adenopathy.  Neurological: She is oriented to person, place, and time.  Skin: Skin is warm and dry. No rash noted. She is not diaphoretic. No erythema. No pallor.  Psychiatric: She has a normal mood and affect. Her behavior is normal. Judgment and thought content normal.     Lab Results  Component Value Date   WBC 6.2 10/29/2011   HGB 13.0 08/15/2012   HCT 38.2 08/15/2012  PLT 232 10/29/2011   GLUCOSE 133* 06/19/2013   CHOL 170 06/19/2013   TRIG 309.0* 06/19/2013   HDL 40.00 06/19/2013   LDLDIRECT 100.9 08/15/2012   LDLCALC 68 06/19/2013   ALT 22 08/15/2012   ALT 22 08/15/2012   AST 21 08/15/2012   AST 21 08/15/2012   NA 139 06/19/2013   K 4.0 06/19/2013   CL 104 06/19/2013   CREATININE 0.7 06/19/2013   BUN 15 06/19/2013   CO2 26 06/19/2013   TSH 3.81 02/07/2010   INR 3.1 06/18/2010   HGBA1C  Value: 6.3 (NOTE)                                                                       According to the ADA Clinical  Practice Recommendations for 2011, when HbA1c is used as a screening test:   >=6.5%   Diagnostic of Diabetes Mellitus           (if abnormal result  is confirmed)  5.7-6.4%   Increased risk of developing Diabetes Mellitus  References:Diagnosis and Classification of Diabetes Mellitus,Diabetes ZDGL,8756,43(PIRJJ 1):S62-S69 and Standards of Medical Care in         Diabetes - 2011,Diabetes Care,2011,34  (Suppl 1):S11-S61.* 04/29/2010       Assessment & Plan:

## 2013-08-16 NOTE — Progress Notes (Signed)
Pre visit review using our clinic review tool, if applicable. No additional management support is needed unless otherwise documented below in the visit note. 

## 2013-08-16 NOTE — Assessment & Plan Note (Signed)
She is due for a f/up mammogram

## 2013-08-24 LAB — HM DIABETES EYE EXAM

## 2013-09-06 ENCOUNTER — Ambulatory Visit (INDEPENDENT_AMBULATORY_CARE_PROVIDER_SITE_OTHER): Payer: Commercial Managed Care - HMO | Admitting: Podiatry

## 2013-09-06 ENCOUNTER — Encounter: Payer: Self-pay | Admitting: Podiatry

## 2013-09-06 ENCOUNTER — Ambulatory Visit (INDEPENDENT_AMBULATORY_CARE_PROVIDER_SITE_OTHER): Payer: Commercial Managed Care - HMO

## 2013-09-06 VITALS — BP 145/68 | HR 60 | Resp 12

## 2013-09-06 DIAGNOSIS — R52 Pain, unspecified: Secondary | ICD-10-CM

## 2013-09-06 DIAGNOSIS — M722 Plantar fascial fibromatosis: Secondary | ICD-10-CM

## 2013-09-06 NOTE — Patient Instructions (Signed)
Plantar Fasciitis  Plantar fasciitis is a common condition that causes foot pain. It is soreness (inflammation) of the band of tough fibrous tissue on the bottom of the foot that runs from the heel bone (calcaneus) to the ball of the foot. The cause of this soreness may be from excessive standing, poor fitting shoes, running on hard surfaces, being overweight, having an abnormal walk, or overuse (this is common in runners) of the painful foot or feet. It is also common in aerobic exercise dancers and ballet dancers.  SYMPTOMS   Most people with plantar fasciitis complain of:   Severe pain in the morning on the bottom of their foot especially when taking the first steps out of bed. This pain recedes after a few minutes of walking.   Severe pain is experienced also during walking following a long period of inactivity.   Pain is worse when walking barefoot or up stairs  DIAGNOSIS    Your caregiver will diagnose this condition by examining and feeling your foot.   Special tests such as X-rays of your foot, are usually not needed.  PREVENTION    Consult a sports medicine professional before beginning a new exercise program.   Walking programs offer a good workout. With walking there is a lower chance of overuse injuries common to runners. There is less impact and less jarring of the joints.   Begin all new exercise programs slowly. If problems or pain develop, decrease the amount of time or distance until you are at a comfortable level.   Wear good shoes and replace them regularly.   Stretch your foot and the heel cords at the back of the ankle (Achilles tendon) both before and after exercise.   Run or exercise on even surfaces that are not hard. For example, asphalt is better than pavement.   Do not run barefoot on hard surfaces.   If using a treadmill, vary the incline.   Do not continue to workout if you have foot or joint problems. Seek professional help if they do not improve.  HOME CARE INSTRUCTIONS     Avoid activities that cause you pain until you recover.   Use ice or cold packs on the problem or painful areas after working out.   Only take over-the-counter or prescription medicines for pain, discomfort, or fever as directed by your caregiver.   Soft shoe inserts or athletic shoes with air or gel sole cushions may be helpful.   If problems continue or become more severe, consult a sports medicine caregiver or your own health care provider. Cortisone is a potent anti-inflammatory medication that may be injected into the painful area. You can discuss this treatment with your caregiver.  MAKE SURE YOU:    Understand these instructions.   Will watch your condition.   Will get help right away if you are not doing well or get worse.  Document Released: 10/14/2000 Document Revised: 04/13/2011 Document Reviewed: 12/14/2007  ExitCare Patient Information 2015 [employer]. This information is not intended to replace advice given to you by your health care provider. Make sure you discuss any questions you have with your health care provider.

## 2013-09-06 NOTE — Progress Notes (Signed)
   Subjective:    Patient ID: Heather Crawford, female    DOB: 08/13/1945, 68 y.o.   MRN: [id number]  HPI N-SHARP PAIN D-3 YEARS L-B/L HEEL AND LATERAL SIDE OF THE FOOT O-SLOWLY C-WORSE  A-WALKING T-INSERTS The left heel is more painful than the right. The pain is increased in the last 8 months with some swelling in the left foot for approximately 4 months.    Review of Systems  HENT: Positive for sinus pressure.   Eyes: Positive for redness.  Cardiovascular: Positive for leg swelling.  Musculoskeletal: Positive for back pain, gait problem and joint swelling.  Allergic/Immunologic: Positive for environmental allergies.  All other systems reviewed and are negative.      Objective:   Physical Exam Orientated x3 white female  Vascular: DP and PT pulses 2/4 bilaterally No calf edema noted bilaterally Mild nonpitting edema of dorsum of left foot  Neurological: Ankle reflexes equal and reactive bilaterally Vibratory sensation reactive bilaterally Sensation to 10 g monofilament wire intact 5/5 right and 4/5 left  Dermatological: Mild nonpitting edema dorsum of left foot  Musculoskeletal: Patient has limping gait walking on her toes on the left foot and unable to place left heel easily on the floor Palpable tenderness medial plantar right and left heel without any palpable lesions Left heel is more tender than the right to palpation Mild palpable tenderness tendo Achilles left without any palpable lesions  X-ray report right foot  Intact bony structure without fracture and/or dislocation  Inferior calcaneal spur  Radiographic impression:  No acute bony abnormality noted in the right foot   X-ray report left foot  Intact bony structure without fracture and/or dislocation  Posterior and inferior calcaneal spurs  On the lateral view small exostosis noted on the head of first metatarsal  Radiographic impression:  No acute bony abnormality no the left foot  Posterior  and inferior calcaneal spurs           Assessment & Plan:   Assessment: Bilateral fasciitis left greater than right Painful gait associated with plantar fasciitis left greater than right  Plan: Offered patient Kenalog Injection inferior left heel she verbally consents  The skin is prepped with alcohol and Betadine and 10 mg of Kenalog mixed with 10 mg of plain Xylocaine and 0.5 mg plain Marcaine were injected inferior heel left for Kenalog injection #1. Patient tolerated procedure without any complaints  A fasciitis wrap was dispensed a wear and left foot Shoeing and stretching discussed  Reappoint x2 weeks

## 2013-09-07 ENCOUNTER — Encounter: Payer: Self-pay | Admitting: Podiatry

## 2013-09-07 DIAGNOSIS — M722 Plantar fascial fibromatosis: Secondary | ICD-10-CM

## 2013-09-07 MED ORDER — TRIAMCINOLONE ACETONIDE 10 MG/ML IJ SUSP
10.0000 mg | Freq: Once | INTRAMUSCULAR | Status: AC
  Administered 2013-09-07: 10 mg

## 2013-09-20 ENCOUNTER — Encounter: Payer: Self-pay | Admitting: Podiatry

## 2013-09-20 ENCOUNTER — Ambulatory Visit (INDEPENDENT_AMBULATORY_CARE_PROVIDER_SITE_OTHER): Payer: Commercial Managed Care - HMO | Admitting: Podiatry

## 2013-09-20 VITALS — BP 164/72 | HR 50 | Resp 14 | Ht 63.0 in | Wt 169.0 lb

## 2013-09-20 DIAGNOSIS — M722 Plantar fascial fibromatosis: Secondary | ICD-10-CM

## 2013-09-20 NOTE — Progress Notes (Signed)
   Subjective:    Patient ID: Heather Crawford, female    DOB: Nov 08, 1945, 68 y.o.   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 On the visit of 09/06/2013 patient was given Kenalog injection into the inferior left heel and a fasciitis that was dispensed. The left heel is still tender, however, improving. The right heel is occasionally symptomatic     Review of Systems  All other systems reviewed and are negative.      Objective:   Physical Exam  Orientated x3 white female  Vascular: DP and PT pulses 2/4 bilaterally  Neurological: Deferred  Musculoskeletal: Mild palpable tenderness plantar fourth right MPJ without any palpable lesions. No crepitus on range of motion of lesser MPJ in the right foot. Mild palpable tenderness inferior right heel without any palpable lesions  Moderate palpable tenderness medial plantar left heel without any palpable lesions      Assessment & Plan:   Assessment: Metatarsalgia right Plantar fasciitis bilaterally left more symptomatic than right  Plan: Patient will wear athletic style shoes he treatment of metatarsalgia right and bilateral fasciitis  The skin is prepped with alcohol and Betadine and 10 mg of Kenalog mixed with 10 mg of plain Xylocaine and 2.5 mg a plain Marcaine were injected inferior heel left for Kenalog injection #2 Maintain fasciitis strap left Maintain that D. stretching  Reappoint x30 days

## 2013-09-21 DIAGNOSIS — M722 Plantar fascial fibromatosis: Secondary | ICD-10-CM

## 2013-09-21 MED ORDER — TRIAMCINOLONE ACETONIDE 10 MG/ML IJ SUSP
10.0000 mg | Freq: Once | INTRAMUSCULAR | Status: AC
  Administered 2013-09-21: 10 mg

## 2013-10-03 ENCOUNTER — Ambulatory Visit (HOSPITAL_COMMUNITY): Payer: Medicare HMO | Attending: Cardiology | Admitting: Cardiology

## 2013-10-03 DIAGNOSIS — I779 Disorder of arteries and arterioles, unspecified: Secondary | ICD-10-CM

## 2013-10-03 DIAGNOSIS — I739 Peripheral vascular disease, unspecified: Secondary | ICD-10-CM

## 2013-10-03 DIAGNOSIS — I6529 Occlusion and stenosis of unspecified carotid artery: Secondary | ICD-10-CM | POA: Insufficient documentation

## 2013-10-03 DIAGNOSIS — I2510 Atherosclerotic heart disease of native coronary artery without angina pectoris: Secondary | ICD-10-CM | POA: Insufficient documentation

## 2013-10-03 DIAGNOSIS — I10 Essential (primary) hypertension: Secondary | ICD-10-CM | POA: Insufficient documentation

## 2013-10-03 DIAGNOSIS — I5032 Chronic diastolic (congestive) heart failure: Secondary | ICD-10-CM

## 2013-10-03 DIAGNOSIS — I38 Endocarditis, valve unspecified: Secondary | ICD-10-CM

## 2013-10-03 DIAGNOSIS — Z87891 Personal history of nicotine dependence: Secondary | ICD-10-CM | POA: Diagnosis not present

## 2013-10-03 DIAGNOSIS — N6099 Unspecified benign mammary dysplasia of unspecified breast: Secondary | ICD-10-CM

## 2013-10-03 DIAGNOSIS — E785 Hyperlipidemia, unspecified: Secondary | ICD-10-CM

## 2013-10-03 DIAGNOSIS — R0602 Shortness of breath: Secondary | ICD-10-CM

## 2013-10-03 NOTE — Progress Notes (Signed)
Carotid duplex performed 

## 2013-10-04 ENCOUNTER — Telehealth: Payer: Self-pay | Admitting: Cardiology

## 2013-10-04 NOTE — Telephone Encounter (Signed)
Spoke with patient about recent carotid dopplers.

## 2013-10-04 NOTE — Telephone Encounter (Signed)
New message          Pt returning anne's phone call

## 2013-10-11 ENCOUNTER — Ambulatory Visit (INDEPENDENT_AMBULATORY_CARE_PROVIDER_SITE_OTHER): Payer: Commercial Managed Care - HMO | Admitting: Podiatry

## 2013-10-11 ENCOUNTER — Encounter: Payer: Self-pay | Admitting: Podiatry

## 2013-10-11 VITALS — BP 127/65 | HR 64 | Resp 12

## 2013-10-11 DIAGNOSIS — M722 Plantar fascial fibromatosis: Secondary | ICD-10-CM

## 2013-10-11 LAB — HM MAMMOGRAPHY: HM Mammogram: NORMAL

## 2013-10-12 NOTE — Progress Notes (Signed)
Patient ID: Heather Crawford, female   DOB: [birth year], 68 y.o.   MRN: [id number]  Subjective: This patient presents for followup care for bilateral fasciitis left more symptomatic than the right. The left heel has had two Kenalog injections with some improvement. Patient is wearing fasciitis strap on the left and is requesting a fasciitis strap for the right.  Objective: There is palpable tenderness the medial plantar fascial band bilaterally without any palpable lesions. The left is more symptomatic than the right.  Assessment: Bilateral fasciitis left more symptomatic than right  Plan: Fasciitis strap dispense for right Digital impression for custom foot orthotics for the indication of bilateral fasciitis Maintain stretching Maintain athletic style shoes  Notify patient upon receipt of custom foot orthotics

## 2013-11-13 ENCOUNTER — Other Ambulatory Visit: Payer: Self-pay | Admitting: Cardiology

## 2013-11-22 ENCOUNTER — Ambulatory Visit (INDEPENDENT_AMBULATORY_CARE_PROVIDER_SITE_OTHER): Payer: Commercial Managed Care - HMO | Admitting: Podiatry

## 2013-11-22 ENCOUNTER — Encounter: Payer: Self-pay | Admitting: Podiatry

## 2013-11-22 VITALS — BP 118/60 | HR 86 | Resp 12

## 2013-11-22 DIAGNOSIS — M722 Plantar fascial fibromatosis: Secondary | ICD-10-CM

## 2013-11-22 NOTE — Progress Notes (Signed)
Patient ID: Heather Crawford, female   DOB: 01-11-1946, 68 y.o.   MRN: [id number]  Subjective: This patient presents for followup care for bilateral fasciitis left more symptomatic than right and dispensing of custom foot orthotics. The left plantar fascial area is injected x2 with Kenalog. Patient also wearing fasciitis strap on left and right The plantar fascial pain has reduced from pre-treatment, however, the left plantar fascial area is more tender than right  Objective: Palpable tenderness medial plantar fascial area bilaterally without any palpable lesions Custom foot orthotics contour satisfactorily  Assessment: Bilateral fasciitis left more symptomatic than right  Plan: Dispensed custom foot orthotics with wearing instruction provided Continue to wear fasciitis trap with custom foot orthotics Maintain stretching  Reappoint x6 weeks

## 2013-11-22 NOTE — Patient Instructions (Signed)
Continue to wear the plantar fasciitis on the left foot with a custom foot orthotic Stretch the left foot toes toward your nose before standing and then bent knee stretching as soon as possible with shoes on  ORTHOTIC INSTRUCTIONS   1) The shoe size most likely will increase 1/2 to one full size with your orthotic.  2) If the orthotic is full length, remove the shoe liner and replace it with your orthotic inside your shoe.  3) When purchasing new shoes, go midday and wear the thickness of socks you plan to wear with the shoe. Place orthotics in the shoes based on comfort. Orthotics may not fit in all shoe styles.  4) Wear the orthotic as long as they are comfortable and then remove. Restart the next day and continue this until you can wear the orthotic comfortably all day.

## 2013-12-08 ENCOUNTER — Encounter: Payer: Self-pay | Admitting: *Deleted

## 2013-12-11 ENCOUNTER — Ambulatory Visit (INDEPENDENT_AMBULATORY_CARE_PROVIDER_SITE_OTHER): Payer: Commercial Managed Care - HMO | Admitting: Cardiology

## 2013-12-11 ENCOUNTER — Encounter: Payer: Self-pay | Admitting: Cardiology

## 2013-12-11 VITALS — BP 140/70 | HR 67 | Ht 63.0 in | Wt 173.4 lb

## 2013-12-11 DIAGNOSIS — I38 Endocarditis, valve unspecified: Secondary | ICD-10-CM

## 2013-12-11 DIAGNOSIS — I5032 Chronic diastolic (congestive) heart failure: Secondary | ICD-10-CM

## 2013-12-11 DIAGNOSIS — I2510 Atherosclerotic heart disease of native coronary artery without angina pectoris: Secondary | ICD-10-CM

## 2013-12-11 DIAGNOSIS — I5041 Acute combined systolic (congestive) and diastolic (congestive) heart failure: Secondary | ICD-10-CM

## 2013-12-11 DIAGNOSIS — I739 Peripheral vascular disease, unspecified: Secondary | ICD-10-CM

## 2013-12-11 DIAGNOSIS — I10 Essential (primary) hypertension: Secondary | ICD-10-CM

## 2013-12-11 DIAGNOSIS — I779 Disorder of arteries and arterioles, unspecified: Secondary | ICD-10-CM

## 2013-12-11 NOTE — Patient Instructions (Signed)
Your physician wants you to follow-up in: 6 months (May 2016)with Dr Aundra Dubin in the [facility] at [place] will receive a reminder letter in the mail two months in advance. If you don't receive a letter, please call our office to schedule the follow-up appointment.

## 2013-12-12 NOTE — Progress Notes (Signed)
Patient ID: Heather Crawford, female   DOB: 07-Aug-1945, 68 y.o.   MRN: [id number] PCP: Dr. Ronnald Ramp  68 yo with history of rheumatic fever and rheumatic valve disease presents for followup.  Patient had an episode as a child that is very consistent with rheumatic fever and has had a heart murmur since that time.  She had been followed by a cardiologist in [facility] in the past for aortic stenosis and mitral regurgitation.   Echo in 1/12 showed moderate aortic stenosis and mild to moderate mitral regurgitation.  EF was mildly decreased (45-50%) with abnormal septal motion consistent with LBBB.   Patient reported dyspnea with moderate exertion such as after walking 100 feet or walking up an incline. She also would get a tightness/pain in her neck when doing the same activities.  No chest pain.  No neck tightness at rest.  We did an adenosine myoview in 2/12 with EF 56% and evidence for ischemia and scar in the mid to apical anterior wall, septal wall, and apex.  Left heart cath was done in 3/12 showing significant RCA disease: 40% ostial, 60% mid, 95% distal.  Dr. Lia Foyer attempted to intervene on the RCA but was unable to adequately seat the catheter for intervention.  Given the large territory supplied by the RCA, CVTS was consulted regarding CABG + AVR given moderate AS.  Patient had AVR with tissue valve and RIMA-RCA in 3/12. Post-op echo in 5/12 showed EF 55-60% and a well-seated bioprosthetic valve.  Echo in 9/14 again showed normal EF and stable bioprosthetic aortic valve.   Overall, patient is doing well today.  Plantar fasciitis is somewhat limiting her activity.  She is short of breath walking up a hill but no problems on flat ground.  No chest pain.  Weight is down by about 10 lbs.   ECG: NSR, LBBB (no change)  Labs (1/12): TGs 596, HDL 40, LDL 171, HCT 39, K 4.6, creatinine  Labs (2/12): K 4.8, creatinine 0.6 Labs (4/12): K 3.9, creatinine 0.8, HCT 32.8 Labs (5/12): K 3.9, creatinine 0.7, LDL 53,  HDL 37 Labs (9/12): K 4, creatinine 0.7 Labs (1/13): LDL 89, HDL 42 Labs (7/13): LDL 104, HDL 42, K 4.6, creatinine 0.6 Labs (7/14): K 4.1, creatinine 0.6, LDL 101, HDL 39 Labs (10/14): K 4.5, creatinine 0.8, BNP 48 Labs (7/15): K 4.3, creatinine 0.6, HCT 37.3, LDL 58, HDl 42, TGs 282  Allergies (verified):  1)  ! Bactrim 2)  ! Amoxicillin 3)  ! Neomycin 4)  ! * Adhesive Tape  Past Medical History: 1. 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 HEART VALVE DISEASE: Echo (1/12) with mildly dilated LV, EF 45-50% with paradoxical septal motion consistent with LBBB, moderate aortic stenosis with mean gradient 27 mmHg, trivial AI, mild to moderate mitral regurgitation with calcified mitral valve.  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 Echo (5/12): EF 55-60%, basal inferior hypokinesis, moderate diastolic dysfunction, normally functioning bioprosthetic aortic valve with mean gradient 13 mmHg, normal RV.  Echo (9/14) with EF 60-65%, bioprosthetic aortic valve with mean gradient 16 mmHg, no AI, mild-moderate MR.  4. LBBB 5. ALLERGIC RHINITIS  6. GERD 7. Hx of DIVERTICULITIS 8. Chronic low back pain with history of back surgery 9. Appendectomy 10. Hysterectomy 11. Hyperlipidemia: Myalgias with atorvastatin and Crestor.  12. CAD: Adenosine myoview (2/12) with EF 56%, ischemia with scar in the mid to apical anterior wall, septal wall, and apex.  LHC (3/12) with EF 55%, 40%  ostial RCA, 60% mid RCA, 95% distal RCA.  Intervention attempted but unable to adequately seat catheter.  Patient had CABG-AVR in 3/12 with RIMA-RCA.    13. Carotid dopplers (3/12): no significant stenosis.  Carotid dopplers (9/14) with 40-59% bilateral ICA stenosis. Carotid dopplers (9/15) with 40-59% bilateral ICA stenosis.  14. Vertigo: labyrinthitis       15. 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 Now full time care giver Lives in [place], grew up in [place]  Family History: Father - deceased @ 47: CAD/MI, CABG at 6 Mother - deceased @ 36: Uterine cancer, DM, HTN, hypothyroid, breast cancer Sister - breast cancer Aunt - colon cancer  Review of Systems        All systems reviewed and negative except as per HPI.   Current Outpatient Prescriptions  Medication Sig Dispense Refill  . aspirin 325 MG tablet Take 325 mg by mouth daily.    Marland Kitchen azithromycin (ZITHROMAX) 500 MG tablet TAKE 1 500 MG TABLET 1 HR PRIOR TO DENTAL WORK 1 tablet 0  . carvedilol (COREG) 6.25 MG tablet TAKE 1 TABLET TWICE DAILY WITH MEALS 180 tablet 1  . cetirizine (ZYRTEC) 10 MG tablet Take 10 mg by mouth daily.    . fluticasone (FLONASE) 50 MCG/ACT nasal spray Place 1 spray into the nose daily.    . furosemide (LASIX) 20 MG tablet TAKE 1 TABLET EVERY DAY  FOR  FLUID  RETENTION 90 tablet 1  . loratadine (CLARITIN) 10 MG tablet Take 10 mg by mouth daily.    . meclizine (ANTIVERT) 25 MG tablet Take 1 tablet (25 mg total) by mouth 3 (three) times daily. May take 1/2 tablet if you wish. 270 tablet 0  . Omega-3 Fatty Acids (FISH OIL) 1000 MG CAPS 2 capsules (total 2000mg ) two times a day  0  . potassium chloride (K-DUR) 10 MEQ tablet Take 1 tablet (10 mEq total) by mouth daily. 90 tablet 2  . ranitidine (ZANTAC) 150 MG tablet Take 150 mg by mouth daily as needed. For reflux    . simvastatin (ZOCOR) 40 MG tablet TAKE 1 TABLET AT BEDTIME 90 tablet 1   No current facility-administered medications for this visit.    BP 140/70 mmHg  Pulse 67  Ht 5\' 3"  (1.6 m)  Wt 173 lb 6.4 oz (78.654 kg)  BMI 30.72 kg/m2 General: NAD, overweight Neck: No JVD, no thyromegaly or thyroid nodule.  Lungs: Slightly decreased breath sounds right base.  CV: Nondisplaced PMI.  Heart regular S1/S2, no S3/S4, 2/6 early SEM RUSB.  Trace ankle edema. Soft bilateral carotid bruits.  Normal pedal  pulses.  Abdomen: Soft, nontender, no hepatosplenomegaly, no distention.  Neurologic: Alert and oriented x 3.  Psych: Normal affect. Extremities: No clubbing or cyanosis.   Assessment/Plan  1. Aortic valve disorder Patient had moderate AS likely due to rheumatic heart disease. She had AVR with bioprosthetic valve in 3/12 since she was also having CABG. Valve was well-seated on last echo in 9/14.  2. CAD, NATIVE VESSEL Status post RIMA-RCA in 3/12. No further chest pain. She will continue ASA, statin, and Coreg.   3. Diastolic CHF, chronic  NYHA class II symptoms, improved.  She does not appear volume overloaded.  Continue current Lasix and KCl.   4. HYPERLIPIDEMIA-MIXED  Myalgias with atorvastatin and Crestor.  She is on simvastatin.  Good LDL in 7/15. 5. Carotid bruit Patient will need repeat carotid US in 9/16.   Followup in 6 months.   Loralie Champagne 12/12/2013

## 2013-12-18 ENCOUNTER — Other Ambulatory Visit (INDEPENDENT_AMBULATORY_CARE_PROVIDER_SITE_OTHER): Payer: Commercial Managed Care - HMO

## 2013-12-18 ENCOUNTER — Encounter: Payer: Self-pay | Admitting: Internal Medicine

## 2013-12-18 ENCOUNTER — Ambulatory Visit (INDEPENDENT_AMBULATORY_CARE_PROVIDER_SITE_OTHER): Payer: Commercial Managed Care - HMO | Admitting: Internal Medicine

## 2013-12-18 VITALS — BP 128/68 | HR 58 | Temp 97.8°F | Resp 16 | Ht 63.0 in | Wt 174.0 lb

## 2013-12-18 DIAGNOSIS — I10 Essential (primary) hypertension: Secondary | ICD-10-CM

## 2013-12-18 DIAGNOSIS — E781 Pure hyperglyceridemia: Secondary | ICD-10-CM

## 2013-12-18 DIAGNOSIS — E118 Type 2 diabetes mellitus with unspecified complications: Secondary | ICD-10-CM

## 2013-12-18 LAB — URINALYSIS, ROUTINE W REFLEX MICROSCOPIC
Bilirubin Urine: NEGATIVE
HGB URINE DIPSTICK: NEGATIVE
KETONES UR: NEGATIVE
Nitrite: NEGATIVE
Specific Gravity, Urine: 1.02 (ref 1.000–1.030)
TOTAL PROTEIN, URINE-UPE24: NEGATIVE
UROBILINOGEN UA: 0.2 (ref 0.0–1.0)
Urine Glucose: NEGATIVE
pH: 6 (ref 5.0–8.0)

## 2013-12-18 LAB — BASIC METABOLIC PANEL
BUN: 13 mg/dL (ref 6–23)
CO2: 24 mEq/L (ref 19–32)
CREATININE: 0.7 mg/dL (ref 0.4–1.2)
Calcium: 9.3 mg/dL (ref 8.4–10.5)
Chloride: 105 mEq/L (ref 96–112)
GFR: 95.67 mL/min (ref >60.00)
Glucose, Bld: 142 mg/dL — ABNORMAL HIGH (ref 70–99)
Potassium: 4.7 mEq/L (ref 3.5–5.1)
Sodium: 142 mEq/L (ref 135–145)

## 2013-12-18 LAB — MICROALBUMIN / CREATININE URINE RATIO
CREATININE, U: 110.7 mg/dL
MICROALB UR: 1.6 mg/dL (ref 0.0–1.9)
Microalb Creat Ratio: 1.4 mg/g (ref 0.0–30.0)

## 2013-12-18 LAB — HEMOGLOBIN A1C: HEMOGLOBIN A1C: 7.1 % — AB (ref 4.6–6.5)

## 2013-12-18 NOTE — Patient Instructions (Signed)

## 2013-12-18 NOTE — Assessment & Plan Note (Signed)
I have asked her to improve her lifestyle modifications Will recheck her A1C today and will treat if needed

## 2013-12-18 NOTE — Assessment & Plan Note (Signed)
Her BP is well controlled I will monitor her lytes and renal function 

## 2013-12-18 NOTE — Progress Notes (Signed)
   Subjective:    Patient ID: Heather Crawford, female    DOB: Dec 19, 1945, 68 y.o.   MRN: [id number]  Diabetes She presents for her follow-up diabetic visit. She has type 2 diabetes mellitus. Her disease course has been stable. Pertinent negatives for diabetes include no blurred vision, no chest pain, no fatigue, no foot paresthesias, no foot ulcerations, no polydipsia, no polyphagia, no polyuria, no visual change, no weakness and no weight loss. Symptoms are stable. There are no diabetic complications. When asked about current treatments, none were reported. Her weight is stable. She is following a generally healthy diet. Meal planning includes avoidance of concentrated sweets. She never participates in exercise. There is no change in her home blood glucose trend. An ACE inhibitor/angiotensin II receptor blocker is being taken. She does not see a podiatrist.Eye exam is current.      Review of Systems  Constitutional: Negative for weight loss and fatigue.  Eyes: Negative for blurred vision.  Cardiovascular: Negative for chest pain.  Endocrine: Negative for polydipsia, polyphagia and polyuria.  Neurological: Negative for weakness.  All other systems reviewed and are negative.      Objective:   Physical Exam  Constitutional: She is oriented to person, place, and time. She appears well-developed and well-nourished. No distress.  HENT:  Head: Normocephalic and atraumatic.  Mouth/Throat: Oropharynx is clear and moist. No oropharyngeal exudate.  Eyes: Conjunctivae are normal. Right eye exhibits no discharge. Left eye exhibits no discharge. No scleral icterus.  Neck: Normal range of motion. Neck supple. No JVD present. No tracheal deviation present. No thyromegaly present.  Cardiovascular: Normal rate, regular rhythm, S1 normal, S2 normal and intact distal pulses.  Exam reveals no gallop, no S3, no S4 and no friction rub.   Murmur heard.  Systolic murmur is present with a grade of 2/6   No  diastolic murmur is present  Pulmonary/Chest: Effort normal and breath sounds normal. No stridor. No respiratory distress. She has no wheezes. She has no rales. She exhibits no tenderness.  Abdominal: Soft. Bowel sounds are normal. She exhibits no distension and no mass. There is no tenderness. There is no rebound and no guarding.  Musculoskeletal: Normal range of motion. She exhibits no edema or tenderness.  Lymphadenopathy:    She has no cervical adenopathy.  Neurological: She is oriented to person, place, and time.  Skin: Skin is warm and dry. No rash noted. She is not diaphoretic. No erythema. No pallor.  Psychiatric: She has a normal mood and affect. Her behavior is normal. Judgment and thought content normal.  Vitals reviewed.   Lab Results  Component Value Date   WBC 5.4 08/16/2013   HGB 12.3 08/16/2013   HCT 37.3 08/16/2013   PLT 218.0 08/16/2013   GLUCOSE 138* 08/16/2013   CHOL 156 08/16/2013   TRIG 282.0* 08/16/2013   HDL 41.50 08/16/2013   LDLDIRECT 100.9 08/15/2012   LDLCALC 58 08/16/2013   ALT 16 08/16/2013   AST 18 08/16/2013   NA 139 08/16/2013   K 4.3 08/16/2013   CL 104 08/16/2013   CREATININE 0.6 08/16/2013   BUN 13 08/16/2013   CO2 28 08/16/2013   TSH 5.00* 08/16/2013   INR 3.1 06/18/2010   HGBA1C 7.1* 08/16/2013        Assessment & Plan:

## 2013-12-18 NOTE — Assessment & Plan Note (Signed)
She will work on her lifestyle modifications to improve this

## 2013-12-18 NOTE — Progress Notes (Signed)
Pre visit review using our clinic review tool, if applicable. No additional management support is needed unless otherwise documented below in the visit note. 

## 2014-01-03 ENCOUNTER — Encounter: Payer: Self-pay | Admitting: Podiatry

## 2014-01-03 ENCOUNTER — Ambulatory Visit (INDEPENDENT_AMBULATORY_CARE_PROVIDER_SITE_OTHER): Payer: Commercial Managed Care - HMO | Admitting: Podiatry

## 2014-01-03 VITALS — BP 137/64 | HR 76 | Resp 12

## 2014-01-03 DIAGNOSIS — M722 Plantar fascial fibromatosis: Secondary | ICD-10-CM

## 2014-01-03 NOTE — Patient Instructions (Signed)
Continue to wear custom orthotics in athletic style shoes daily Wear fasciitis strap on left foot daily with orthotic Maintain stretching Adjust any walking to tolerance  Reappoint at your request  Plantar Fasciitis Plantar fasciitis is a common condition that causes foot pain. It is soreness (inflammation) of the band of tough fibrous tissue on the bottom of the foot that runs from the heel bone (calcaneus) to the ball of the foot. The cause of this soreness may be from excessive standing, poor fitting shoes, running on hard surfaces, being overweight, having an abnormal walk, or overuse (this is common in runners) of the painful foot or feet. It is also common in aerobic exercise dancers and ballet dancers. SYMPTOMS  Most people with plantar fasciitis complain of:  Severe pain in the morning on the bottom of their foot especially when taking the first steps out of bed. This pain recedes after a few minutes of walking.  Severe pain is experienced also during walking following a long period of inactivity.  Pain is worse when walking barefoot or up stairs DIAGNOSIS   Your caregiver will diagnose this condition by examining and feeling your foot.  Special tests such as X-rays of your foot, are usually not needed. PREVENTION   Consult a sports medicine professional before beginning a new exercise program.  Walking programs offer a good workout. With walking there is a lower chance of overuse injuries common to runners. There is less impact and less jarring of the joints.  Begin all new exercise programs slowly. If problems or pain develop, decrease the amount of time or distance until you are at a comfortable level.  Wear good shoes and replace them regularly.  Stretch your foot and the heel cords at the back of the ankle (Achilles tendon) both before and after exercise.  Run or exercise on even surfaces that are not hard. For example, asphalt is better than pavement.  Do not run  barefoot on hard surfaces.  If using a treadmill, vary the incline.  Do not continue to workout if you have foot or joint problems. Seek professional help if they do not improve. HOME CARE INSTRUCTIONS   Avoid activities that cause you pain until you recover.  Use ice or cold packs on the problem or painful areas after working out.  Only take over-the-counter or prescription medicines for pain, discomfort, or fever as directed by your caregiver.  Soft shoe inserts or athletic shoes with air or gel sole cushions may be helpful.  If problems continue or become more severe, consult a sports medicine caregiver or your own health care provider. Cortisone is a potent anti-inflammatory medication that may be injected into the painful area. You can discuss this treatment with your caregiver. MAKE SURE YOU:   Understand these instructions.  Will watch your condition.  Will get help right away if you are not doing well or get worse. Document Released: 10/14/2000 Document Revised: 04/13/2011 Document Reviewed: 12/14/2007 [facility] Patient Information 2015 [place], [place]. This information is not intended to replace advice given to you by your health care provider. Make sure you discuss any questions you have with your health care provider.

## 2014-01-04 NOTE — Progress Notes (Signed)
Patient ID: Heather Crawford, female   DOB: [birth year], 68 y.o.   MRN: [id number]  Subjective: This patient presents for follow-up care for bilateral fasciitis left more symptomatic than the right. The left plantar fascial areas injected 2 with Kenalog. She is currently wearing custom foot orthotics with comfort and a plantar fascial strap on the left. At this time she states that the left lateral fascial pain is improved considerably.  Objective: Orientated 3  Vascular: DP and PT pulses 2/4 bilaterally  Neurological: Deferred  Musculoskeletal: Palpable tenderness medial plantar fascial insertional area left without any palpable lesions. This duplicates area of discomfort There is no palpable tenderness in the right plantar fascia.  Orthotics contour well  Assessment: Satisfactory fit of custom foot orthotics Resolved plantar fasciitis right Low-grade plantar fasciitis left  Plan: Maintain custom foot orthotics on a continuous daily basis Maintain fasciitis strap with custom foot orthotics left Maintain stretching   Reappoint at patient's request

## 2014-02-07 ENCOUNTER — Other Ambulatory Visit: Payer: Self-pay | Admitting: Cardiology

## 2014-04-25 ENCOUNTER — Other Ambulatory Visit: Payer: Self-pay

## 2014-04-25 MED ORDER — FLUTICASONE PROPIONATE 50 MCG/ACT NA SUSP: 1.0000 | Freq: Every day | NASAL | Status: DC

## 2014-04-26 ENCOUNTER — Other Ambulatory Visit: Payer: Self-pay | Admitting: Cardiology

## 2014-05-17 ENCOUNTER — Other Ambulatory Visit: Payer: Self-pay | Admitting: Cardiology

## 2014-07-17 ENCOUNTER — Other Ambulatory Visit: Payer: Self-pay | Admitting: Cardiology

## 2014-07-30 ENCOUNTER — Other Ambulatory Visit: Payer: Self-pay

## 2014-08-13 ENCOUNTER — Ambulatory Visit (HOSPITAL_COMMUNITY)
Admission: RE | Admit: 2014-08-13 | Discharge: 2014-08-13 | Disposition: A | Discharge status: Home / Self Care | Payer: Commercial Managed Care - HMO | Source: Ambulatory Visit | Attending: Cardiology | Admitting: Cardiology

## 2014-08-13 VITALS — BP 118/54 | HR 77 | Wt 176.0 lb

## 2014-08-13 DIAGNOSIS — Z7982 Long term (current) use of aspirin: Secondary | ICD-10-CM | POA: Insufficient documentation

## 2014-08-13 DIAGNOSIS — R0989 Other specified symptoms and signs involving the circulatory and respiratory systems: Secondary | ICD-10-CM | POA: Insufficient documentation

## 2014-08-13 DIAGNOSIS — I358 Other nonrheumatic aortic valve disorders: Secondary | ICD-10-CM | POA: Insufficient documentation

## 2014-08-13 DIAGNOSIS — Z79899 Other long term (current) drug therapy: Secondary | ICD-10-CM | POA: Insufficient documentation

## 2014-08-13 DIAGNOSIS — I5032 Chronic diastolic (congestive) heart failure: Secondary | ICD-10-CM

## 2014-08-13 DIAGNOSIS — E781 Pure hyperglyceridemia: Secondary | ICD-10-CM

## 2014-08-13 DIAGNOSIS — I739 Peripheral vascular disease, unspecified: Secondary | ICD-10-CM

## 2014-08-13 DIAGNOSIS — K219 Gastro-esophageal reflux disease without esophagitis: Secondary | ICD-10-CM | POA: Diagnosis not present

## 2014-08-13 DIAGNOSIS — I779 Disorder of arteries and arterioles, unspecified: Secondary | ICD-10-CM

## 2014-08-13 DIAGNOSIS — I38 Endocarditis, valve unspecified: Secondary | ICD-10-CM | POA: Diagnosis not present

## 2014-08-13 DIAGNOSIS — E785 Hyperlipidemia, unspecified: Secondary | ICD-10-CM | POA: Insufficient documentation

## 2014-08-13 DIAGNOSIS — I2510 Atherosclerotic heart disease of native coronary artery without angina pectoris: Secondary | ICD-10-CM | POA: Insufficient documentation

## 2014-08-13 LAB — BASIC METABOLIC PANEL
Anion gap: 9 (ref 5–15)
BUN: 12 mg/dL (ref 6–20)
CHLORIDE: 103 mmol/L (ref 101–111)
CO2: 28 mmol/L (ref 22–32)
Calcium: 9 mg/dL (ref 8.9–10.3)
Creatinine, Ser: 0.78 mg/dL (ref 0.44–1.00)
GFR calc Af Amer: >60 mL/min (ref >60)
Glucose, Bld: 183 mg/dL — ABNORMAL HIGH (ref 65–99)
POTASSIUM: 4.1 mmol/L (ref 3.5–5.1)
Sodium: 140 mmol/L (ref 135–145)

## 2014-08-13 LAB — LIPID PANEL
CHOL/HDL RATIO: 4.5 ratio
CHOLESTEROL: 161 mg/dL (ref 0–200)
HDL: 36 mg/dL — ABNORMAL LOW (ref >40)
LDL CALC: 65 mg/dL (ref 0–99)
TRIGLYCERIDES: 300 mg/dL — AB (ref <150)
VLDL: 60 mg/dL — ABNORMAL HIGH (ref 0–40)

## 2014-08-13 NOTE — Patient Instructions (Signed)
Labs today  Your physician has requested that you have an echocardiogram. Echocardiography is a painless test that uses sound waves to create images of your heart. It provides your doctor with information about the size and shape of your heart and how well your heart's chambers and valves are working. This procedure takes approximately one hour. There are no restrictions for this procedure.  IN SEPT  Your physician has requested that you have a carotid duplex. This test is an ultrasound of the carotid arteries in your neck. It looks at blood flow through these arteries that supply the brain with blood. Allow one hour for this exam. There are no restrictions or special instructions.  IN SEPT  We will contact you in 6 months to schedule your next appointment with Dr Aundra Dubin at [facility]

## 2014-08-13 NOTE — Progress Notes (Signed)
Patient ID: Heather Crawford, female   DOB: 04/07/1945, 69 y.o.   MRN: [id number] PCP: Dr. Ronnald Ramp  69 yo with history of rheumatic fever and rheumatic valve disease presents for followup.  Patient had an episode as a child that is very consistent with rheumatic fever and has had a heart murmur since that time.  She had been followed by a cardiologist in [facility] in the past for aortic stenosis and mitral regurgitation.   Echo in 1/12 showed moderate aortic stenosis and mild to moderate mitral regurgitation.  EF was mildly decreased (45-50%) with abnormal septal motion consistent with LBBB.   Patient reported dyspnea with moderate exertion such as after walking 100 feet or walking up an incline. She also would get a tightness/pain in her neck when doing the same activities.  No chest pain.  No neck tightness at rest.  We did an adenosine myoview in 2/12 with EF 56% and evidence for ischemia and scar in the mid to apical anterior wall, septal wall, and apex.  Left heart cath was done in 3/12 showing significant RCA disease: 40% ostial, 60% mid, 95% distal.  Dr. Lia Foyer attempted to intervene on the RCA but was unable to adequately seat the catheter for intervention.  Given the large territory supplied by the RCA, CVTS was consulted regarding CABG + AVR given moderate AS.  Patient had AVR with tissue valve and RIMA-RCA in 3/12. Post-op echo in 5/12 showed EF 55-60% and a well-seated bioprosthetic valve.  Echo in 9/14 again showed normal EF and stable bioprosthetic aortic valve.   Overall, patient is doing well today.  She is limited mainly by low back pain.  She is short of breath walking up a hill but no problems on flat ground.  No chest pain.    ECG: NSR, LBBB (no change)  Labs (1/12): TGs 596, HDL 40, LDL 171, HCT 39, K 4.6, creatinine  Labs (2/12): K 4.8, creatinine 0.6 Labs (4/12): K 3.9, creatinine 0.8, HCT 32.8 Labs (5/12): K 3.9, creatinine 0.7, LDL 53, HDL 37 Labs (9/12): K 4, creatinine  0.7 Labs (1/13): LDL 89, HDL 42 Labs (7/13): LDL 104, HDL 42, K 4.6, creatinine 0.6 Labs (7/14): K 4.1, creatinine 0.6, LDL 101, HDL 39 Labs (10/14): K 4.5, creatinine 0.8, BNP 48 Labs (7/15): K 4.3, creatinine 0.6, HCT 37.3, LDL 58, HDl 42, TGs 282 Labs (11/15): K 4.7, creatinine 0.7  Allergies (verified):  1)  ! Bactrim 2)  ! Amoxicillin 3)  ! Neomycin 4)  ! * Adhesive Tape  Past Medical History: 1. 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 HEART VALVE DISEASE: Echo (1/12) with mildly dilated LV, EF 45-50% with paradoxical septal motion consistent with LBBB, moderate aortic stenosis with mean gradient 27 mmHg, trivial AI, mild to moderate mitral regurgitation with calcified mitral valve.  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 Echo (5/12): EF 55-60%, basal inferior hypokinesis, moderate diastolic dysfunction, normally functioning bioprosthetic aortic valve with mean gradient 13 mmHg, normal RV.  Echo (9/14) with EF 60-65%, bioprosthetic aortic valve with mean gradient 16 mmHg, no AI, mild-moderate MR.  4. LBBB 5. ALLERGIC RHINITIS  6. GERD 7. Hx of DIVERTICULITIS 8. Chronic low back pain with history of back surgery 9. Appendectomy 10. Hysterectomy 11. Hyperlipidemia: Myalgias with atorvastatin and Crestor.  12. CAD: Adenosine myoview (2/12) with EF 56%, ischemia with scar in the mid to apical anterior wall, septal wall, and apex.  LHC (3/12) with EF 55%, 40%  ostial RCA, 60% mid RCA, 95% distal RCA.  Intervention attempted but unable to adequately seat catheter.  Patient had CABG-AVR in 3/12 with RIMA-RCA.    13. Carotid dopplers (3/12): no significant stenosis.  Carotid dopplers (9/14) with 40-59% bilateral ICA stenosis. Carotid dopplers (9/15) with 40-59% bilateral ICA stenosis.  14. Vertigo: labyrinthitis       15. 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 Now full time care giver Lives in [place], grew up in [place]  Family History: Father - deceased @ 67: CAD/MI, CABG at 12 Mother - deceased @ 43: Uterine cancer, DM, HTN, hypothyroid, breast cancer Sister - breast cancer Aunt - colon cancer  Review of Systems        All systems reviewed and negative except as per HPI.   Current Outpatient Prescriptions  Medication Sig Dispense Refill  . aspirin 325 MG tablet Take 325 mg by mouth daily.    Marland Kitchen azithromycin (ZITHROMAX) 500 MG tablet TAKE 1 500 MG TABLET 1 HR PRIOR TO DENTAL WORK 1 tablet 0  . carvedilol (COREG) 6.25 MG tablet TAKE 1 TABLET TWICE DAILY WITH MEALS 180 tablet 2  . cetirizine (ZYRTEC) 10 MG tablet Take 10 mg by mouth daily.    . fluticasone (FLONASE) 50 MCG/ACT nasal spray Place 1 spray into both nostrils daily. 29.7 g 3  . furosemide (LASIX) 20 MG tablet TAKE 1 TABLET EVERY DAY  FOR  FLUID  RETENTION 90 tablet 2  . loratadine (CLARITIN) 10 MG tablet Take 10 mg by mouth daily.    . meclizine (ANTIVERT) 25 MG tablet Take 1 tablet (25 mg total) by mouth 3 (three) times daily. May take 1/2 tablet if you wish. 270 tablet 0  . potassium chloride (K-DUR) 10 MEQ tablet Take 1 tablet (10 mEq total) by mouth daily. 90 tablet 2  . ranitidine (ZANTAC) 150 MG tablet Take 150 mg by mouth daily as needed. For reflux    . simvastatin (ZOCOR) 40 MG tablet TAKE 1 TABLET AT BEDTIME 90 tablet 0   No current facility-administered medications for this encounter.    BP 118/54 mmHg  Pulse 77  Wt 176 lb (79.833 kg)  SpO2 94% General: NAD, overweight Neck: No JVD, no thyromegaly or thyroid nodule.  Lungs: CTAB  CV: Nondisplaced PMI.  Heart regular S1/S2, no S3/S4, 2/6 early SEM RUSB.  Trace ankle edema. Soft bilateral carotid bruits.  Normal pedal pulses.  Abdomen: Soft, nontender, no hepatosplenomegaly, no distention.  Neurologic: Alert and oriented x 3.  Psych: Normal affect. Extremities:  No clubbing or cyanosis.   Assessment/Plan  1. Aortic valve disorder Patient had moderate AS likely due to rheumatic heart disease. She had AVR with bioprosthetic valve in 3/12 since she was also having CABG. Valve was well-seated on last echo in 9/14.  - I will arrange for repeat echo to reassess valve in 9/16.  2. CAD, NATIVE VESSEL Status post RIMA-RCA in 3/12. No chest pain. She will continue ASA, statin, and Coreg.   3. Diastolic CHF, chronic  NYHA class II symptoms, improved.  She does not appear volume overloaded.  Continue current Lasix and KCl.  BMET today.  4. HYPERLIPIDEMIA-MIXED  Myalgias with atorvastatin and Crestor.  She is on simvastatin.  Check lipids today. 5. Carotid bruit Patient will need repeat carotid US in 9/16.   Followup in 6  months.   Loralie Champagne 08/13/2014

## 2014-10-02 ENCOUNTER — Telehealth: Payer: Self-pay

## 2014-10-02 NOTE — Telephone Encounter (Signed)
Call to introduce AWV to see if they can make separate apt since they are coming together to CPE; Left VM with confidential number; they live in [place].  Apt is 9/14 at 1:15 and 1:45;

## 2014-10-02 NOTE — Telephone Encounter (Signed)
Call to review AWV; Stated that her dtr is wellness nurse and asked her to come. She is going to have test done 9/8 at 9 and 10 and will try to come in with spouse at 11:15;  Will schedule for 9/8 at 11:15;  disucssed Pneumonia vaccines and thinks she had 3 but 2 have been documented; Does not want to take the flu shot due to allergies several meds, especially antibiotics.

## 2014-10-11 ENCOUNTER — Other Ambulatory Visit: Payer: Self-pay

## 2014-10-11 ENCOUNTER — Ambulatory Visit (INDEPENDENT_AMBULATORY_CARE_PROVIDER_SITE_OTHER): Payer: Commercial Managed Care - HMO

## 2014-10-11 ENCOUNTER — Telehealth: Payer: Self-pay

## 2014-10-11 ENCOUNTER — Ambulatory Visit (HOSPITAL_COMMUNITY)
Admission: RE | Admit: 2014-10-11 | Discharge: 2014-10-11 | Disposition: A | Discharge status: Home / Self Care | Payer: Commercial Managed Care - HMO | Source: Ambulatory Visit | Attending: Cardiovascular Disease | Admitting: Cardiovascular Disease

## 2014-10-11 ENCOUNTER — Ambulatory Visit (HOSPITAL_BASED_OUTPATIENT_CLINIC_OR_DEPARTMENT_OTHER): Payer: Commercial Managed Care - HMO

## 2014-10-11 VITALS — BP 134/70 | Ht 63.0 in | Wt 173.5 lb

## 2014-10-11 DIAGNOSIS — M8580 Other specified disorders of bone density and structure, unspecified site: Secondary | ICD-10-CM

## 2014-10-11 DIAGNOSIS — I739 Peripheral vascular disease, unspecified: Secondary | ICD-10-CM

## 2014-10-11 DIAGNOSIS — I5032 Chronic diastolic (congestive) heart failure: Secondary | ICD-10-CM

## 2014-10-11 DIAGNOSIS — Z0000 Encounter for general adult medical examination without abnormal findings: Secondary | ICD-10-CM | POA: Diagnosis not present

## 2014-10-11 DIAGNOSIS — I083 Combined rheumatic disorders of mitral, aortic and tricuspid valves: Secondary | ICD-10-CM | POA: Diagnosis not present

## 2014-10-11 DIAGNOSIS — I2510 Atherosclerotic heart disease of native coronary artery without angina pectoris: Secondary | ICD-10-CM | POA: Diagnosis not present

## 2014-10-11 DIAGNOSIS — E785 Hyperlipidemia, unspecified: Secondary | ICD-10-CM | POA: Diagnosis not present

## 2014-10-11 DIAGNOSIS — I447 Left bundle-branch block, unspecified: Secondary | ICD-10-CM | POA: Diagnosis not present

## 2014-10-11 DIAGNOSIS — I38 Endocarditis, valve unspecified: Secondary | ICD-10-CM

## 2014-10-11 DIAGNOSIS — Z1231 Encounter for screening mammogram for malignant neoplasm of breast: Secondary | ICD-10-CM | POA: Diagnosis not present

## 2014-10-11 DIAGNOSIS — I6523 Occlusion and stenosis of bilateral carotid arteries: Secondary | ICD-10-CM | POA: Diagnosis not present

## 2014-10-11 DIAGNOSIS — Z87891 Personal history of nicotine dependence: Secondary | ICD-10-CM | POA: Insufficient documentation

## 2014-10-11 DIAGNOSIS — I779 Disorder of arteries and arterioles, unspecified: Secondary | ICD-10-CM | POA: Diagnosis not present

## 2014-10-11 DIAGNOSIS — I10 Essential (primary) hypertension: Secondary | ICD-10-CM | POA: Diagnosis not present

## 2014-10-11 NOTE — Progress Notes (Addendum)
Subjective:   Heather Crawford is a 69 y.o. female who presents for Medicare Annual (Subsequent) preventive examination.  Review of Systems:   Cardiac Risk Factors include: advanced age (>75men, >50 women);dyslipidemia HRA assessment completed during visit;  Patient is here for Annual Wellness Assessment The Patient was informed that this wellness visit is to identify risk and educate on how to reduce risk for increase disease through lifestyle changes.   ROS deferred to CPE exam with physician on 9/14   BMI: 30.6  Diet; Meat; 2 to 3 vegetables; She watches carbs; One carb to 2 carbs a meal    Exercise; not sedentary; now doing gardening, canning and freezing;  Green beans, butter beans, tomatoes; Cans 40 quarts;  Planting greens now for fall  Has chickens as well; fresh eggs  SAFETY one level / Dtr wants them to move to [place];  They are not ready at this time Safety reviewed for the home; including removal of clutter; clear paths through the home, eliminating clutter, railing as needed; bathroom safety reviewed and have modified; community safety; smoke detectors and firearms safety education given as appropriate Driving accidents; no and wears seatbelt Sun protection/ hx of squamous cell cancer/REcommended hat and sun screen;   Medication review/ New meds/ no  Fall assessment / no  Gait assessment/ no  Mobilization and Functional losses in the last year.none   Urinary or fecal incontinence reviewed/ no  Counseling: Hepatitis C/ will discuss with Dr. Ronnald Ramp and will draw hep c with other blood work; Visit other countries and plans to go back next year.   A1c;  12/2013- 7.1/ Repeat per Dr. Ronnald Ramp at [place]; States they had been out of the country and was eating a lot of rice last year when this was completed; Educated on pre-diabetes to diabetes and need for another A1c; to check   Ophthalmology exam; 08/2013  Colonoscopy; 2009 but did not have it; Due 2015 but never  set up; Never sent the referral; will fup with Dr. Kinnie Feil at [facility];  EKG 08/13/2014 Dexa scan; 08/2011 Lowest T-score is -1.6, c/w moderate osteopenia.  Consider adding Calcium/Vit D therapy, weight bearing exercise, bisphosphonate therapy, and follow up DXA at 25 months (09/2013) Mammogram 10/2013 and normal Hearing: 2000hz  Vision test; Glaucoma just dx but doctor checked x3 and was not sure; The patient agreed to check with insurance or other and make another referral.   Immunizations Due  Flu: States she does not take due to allergies/ antibiotics and tape        Objective:     Vitals: BP 134/70 mmHg  Ht 5\' 3"  (1.6 m)  Wt 173 lb 8 oz (78.699 kg)  BMI 30.74 kg/m2  Tobacco History  Smoking status  . Former Smoker -- 1.00 packs/day for 40 years  . Types: Cigarettes  . Quit date: 02/03/2000  Smokeless tobacco  . Former Systems developer  . Types: Chew    Comment: discussed LDCT      Counseling given: Yes   Past Medical History  Diagnosis Date  . History of rheumatic fever   . 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 Aortic stenosis     a.  echo 1/12 w/mildly dilated LV, EF 45-50% w/paradoxical septal motion consistent w/ LBBB, moderate aortic stenosis w/mean gradient 27 mmHg, trivial AI, mild to moderate MR w/calcified mitral valve;   b. s/p AVR with 21 mm Magna Ease valve  . LBBB (left bundle branch block)   .  Allergic rhinitis, cause unspecified   . Esophageal reflux   . Diverticulitis of colon (without mention of hemorrhage)   . Hyperlipidemia   . Hx of hysterectomy   . Chronic back pain     H/O BACK SURGERY  . Glucose intolerance (impaired glucose tolerance)     A1C 6.3 (4/12)  . Heart murmur   . Aortic valve replaced 2012  . Cancer     skin sarcomas removed  . Hx: UTI (urinary tract infection)   . Myocardial infarction 2002  . Coronary artery disease     adenosine myoview 2/12 w/EF 56%, ISCHEMIA W/SCAR IN THE MID TO APICAL ANTERIO WALL, SEPTAL WALL, AND APEX.  LHC 3/12 W/EF 55%, 40% OSTIAL RCA, 60% MID RCA, 95% DISTAL RC A, INTERVENTION  ATTEMPTED BUT UNABLE TO ADEQUADATELY SEAT  CATHETER.  Marland Kitchen CAD (coronary artery disease)     a. s/p CABG 4/12: RIMA-RCA  . CAD, multiple vessel     sees Dr Aundra Dubin every 3-4 months  . H/O vertigo     with fluid  in ears  . PONV (postoperative nausea and vomiting)   . Urinary frequency   . Hemorrhoids   . Snoring   . H/O hiatal hernia   . Arthritis     joint pain  . CHF (congestive heart failure)     chronic diastolic CHF   Past Surgical History  Procedure Laterality Date  . Appendectomy  1973  . Tonsillectomy      1955  . Lumbar disc surgery      DR. HUSSEY- [place]  . Abdominal hysterectomy    . Dilation and curettage of uterus    . Laparoscopic lysis intestinal adhesions    . Endometrial ablation    . Cardiac catheterization  2012  . Coronary artery bypass graft  2012  . Cardiac valve replacement  2012    Aortic  . Tonsillectomy    . Back surgery      Lumbar Lam and discectomy x 2  . Breast surgery  10/2011    Breast biopsy  . Skin surgery      skin sarcomas removed   Family History  Problem Relation Age of Onset  . Hypertension Mother   . Breast cancer Mother   . Hypothyroidism Mother   . Diabetes type II Mother   . Heart disease Mother   . Heart attack Father     CABG @ 90  . Breast cancer Sister   . Colon cancer    . Breast cancer Maternal Aunt   . Colon cancer Paternal Uncle   . CAD Father    History  Sexual Activity  . Sexual Activity: Not on file    Outpatient Encounter Prescriptions as of 10/11/2014  Medication Sig  . aspirin 325 MG tablet Take 325 mg by mouth daily.  Marland Kitchen azithromycin (ZITHROMAX) 500 MG tablet TAKE 1 500 MG TABLET 1 HR PRIOR TO DENTAL WORK  . carvedilol (COREG) 6.25 MG tablet TAKE 1 TABLET TWICE DAILY WITH MEALS  . cetirizine (ZYRTEC) 10 MG tablet Take 10 mg by mouth daily.  . fluticasone (FLONASE) 50 MCG/ACT nasal spray Place 1 spray into both nostrils  daily.  . furosemide (LASIX) 20 MG tablet TAKE 1 TABLET EVERY DAY  FOR  FLUID  RETENTION  . loratadine (CLARITIN) 10 MG tablet Take 10 mg by mouth daily.  . meclizine (ANTIVERT) 25 MG tablet Take 1 tablet (25 mg total) by mouth 3 (three) times daily. May take  1/2 tablet if you wish.  . potassium chloride (K-DUR) 10 MEQ tablet Take 1 tablet (10 mEq total) by mouth daily.  . ranitidine (ZANTAC) 150 MG tablet Take 150 mg by mouth daily as needed. For reflux  . simvastatin (ZOCOR) 40 MG tablet TAKE 1 TABLET AT BEDTIME   No facility-administered encounter medications on file as of 10/11/2014.    Activities of Daily Living In your present state of health, do you have any difficulty performing the following activities: 10/11/2014  Hearing? N  Vision? N  Difficulty concentrating or making decisions? N  Walking or climbing stairs? N  Dressing or bathing? N  Doing errands, shopping? N  Preparing Food and eating ? N  Using the Toilet? N  In the past six months, have you accidently leaked urine? N  Do you have problems with loss of bowel control? N  Managing your Medications? N  Managing your Finances? N  Housekeeping or managing your Housekeeping? N    Patient Care Team: Janith Lima, MD as PCP - General (Internal Medicine) Larey Dresser, MD (Cardiology) Melrose Nakayama, MD (Cardiothoracic Surgery)    Assessment:  Assessment   Today patient counseled on age appropriate routine health concerns for screening and prevention, each reviewed and up to date or declined. Immunizations reviewed and declined flu;  Labs deferred for CPE or medical fup by MD.  Risk factors for depression reviewed and negative. Hearing function and visual acuity are intact. ADLs screened and addressed as needed. Functional ability and level of safety reviewed and appropriate. Educated on memory loss and AD8 score 0. Education, counseling and referrals performed based on assessed risks today. Patient provided with a  copy of personalized plan for preventive services and due dates   HEPATITIS SCREEN reviewed; no risk identified but agreed to have Hep c antibody due to her trips overseas to visit son in [place] and born in 1945; will discuss receipt of Hep B as well  TOBACCO quit; had 30 pk year hx; discussed LDCT if she would like. ETOH or other DRUG use was negative  RISK FOR CVD or DM  Controllable  risk for heart disease reviewed for goal setting: Includes; family hx; HTN; decreased renal function; obesity; sedentary lifestyle Educated regarding Metabolic syndrome / Factors that increase risk for Heart Disease: Excess body fat around the waist Waist circumference women >35 Triglycerides > 150 HDL < 50 BP > 130/85 Glucose > 100  EXERCISE / DIET RECOMMENDATIONS to monitor fat and sugar intake; Agreed to start walking and spouse agreed to walk with her  [facility] confirmed the patient was due colonoscopy in 2014; did not have one in 2015 due to no referral; Will discuss options with Dr. Ronnald Ramp.     Exercise Activities and Dietary recommendations Current Exercise Habits:: Home exercise routine (yard work; will start walking), Time (Minutes): 30, Frequency (Times/Week): 3, Weekly Exercise (Minutes/Week): 90, Intensity: Mild  Goals    . Exercise 150 minutes per week (moderate activity)     Will start walking x 30 minutes 3 days a week  Will build up to this time and then move on.  Keep journal      Fall Risk Fall Risk  10/11/2014 08/16/2013  Falls in the past year? No No   Depression Screen PHQ 2/9 Scores 10/11/2014 08/16/2013  PHQ - 2 Score 0 0     Cognitive Testing MMSE - Mini Mental State Exam 10/11/2014  Not completed: Unable to complete    Immunization  History  Administered Date(s) Administered  . Pneumococcal Conjugate-13 08/16/2013  . Pneumococcal Polysaccharide-23 08/12/2011  . Td 02/21/2010  . Tdap 02/21/2010   Screening Tests Health Maintenance  Topic Date Due    . Hepatitis C Screening  01/19/1946  . HEMOGLOBIN A1C  06/18/2014  . OPHTHALMOLOGY EXAM  08/25/2014  . INFLUENZA VACCINE  09/03/2014  . FOOT EXAM  12/19/2014  . URINE MICROALBUMIN  12/19/2014  . MAMMOGRAM  10/12/2015  . COLONOSCOPY  06/11/2017  . TETANUS/TDAP  02/22/2020  . DEXA SCAN  Completed  . ZOSTAVAX  Addressed  . PNA vac Low Risk Adult  Completed      Plan:   1; to discuss hep b and Hep c antibody with dr. Ronnald Ramp 2. Will review status of diabetes;  Discussed pre-diabetes and early treatment; does not want to take another pill. Agrees to start walking 3 times a week; 30 minutes up to 60 as tolerated 3. Declines flu shot; 4. Will make apt for eye exam to review for glaucoma; see note 5. Bone density and dexa ordered to have at [place];  During the course of the visit the patient was educated and counseled about the following appropriate screening and preventive services:   Vaccines to include Pneumoccal, Influenza, Hepatitis B, Td, Zostavax, HCV/ will discuss Hep B; Hep C titer  Electrocardiogram/ deferred to cardiology  Cardiovascular Disease/ no issues at present except minimal sob when climbing hills or stairs  Colorectal cancer screening/ needs to be scheduled or options  Bone density screening/ ordered  Diabetes screening/ to follow up with Dr. Ronnald Ramp  Glaucoma screening/ to find new eye doctor for apt  Mammography/PAP to have one in Sept;   Nutrition counseling   Patient Instructions (the written plan) was given to the patient.   ZOXWR,UEAVW, RN  10/11/2014     Medical screening examination/treatment/procedure(s) were performed by non-physician practitioner and as supervising physician I was immediately available for consultation/collaboration. I agree with above. Scarlette Calico, MD

## 2014-10-11 NOTE — Telephone Encounter (Signed)
The patient at [place] stated that she 'thought' her colonoscopy was due 2015; but she did not receive a call back for referral. Permission rec'd to call [facility] to fup  Call to [phone] and was told by receptionist that the patient was seen in 2009 and was due in 2014; but did not show; was not called back due to "non-compliance"   Will fup with Dr .Ronnald Ramp to address at CPE

## 2014-10-11 NOTE — Patient Instructions (Addendum)
Heather Crawford , Thank you for taking time to come for your Medicare Wellness Visit. I appreciate your ongoing commitment to your health goals. Please review the following plan we discussed and let me know if I can assist you in the future  Discussed; hep c; A1c; vision check r/o glaucoma; walking program, bone density and dexa; (added post visit but discussed with the patient)    These are the goals we discussed: Goals    . Exercise 150 minutes per week (moderate activity)     Will start walking x 30 minutes 3 days a week  Will build up to this time and then move on.  Keep journal        This is a list of the screening recommended for you and due dates:  Health Maintenance  Topic Date Due  .  Hepatitis C: One time screening is recommended by Center for Disease Control  (CDC) for  adults born from [birth year] through 1967.   [birth year]  . Hemoglobin A1C  06/18/2014  . Eye exam for diabetics  08/25/2014  . Flu Shot  09/03/2014  . Complete foot exam   12/19/2014  . Urine Protein Check  12/19/2014  . Mammogram  10/12/2015  . Colon Cancer Screening  06/11/2017  . Tetanus Vaccine  02/22/2020  . DEXA scan (bone density measurement)  Completed  . Shingles Vaccine  Addressed  . Pneumonia vaccines  Completed    Fat free or low fat dairy products Fish high in omega-3 acids ( salmon, tuna, trout) Fruits, such as apples, bananas, oranges, pears, prunes Legumes, such as kidney beans, lentils, checkpeas, black-eyed peas and lima beans Vegetables; broccoli, cabbage, carrots Whole grains;   Plant fats are better; decrease "white" foods as pasta, rice, bread and desserts, sugar; Avoid red meat (limiting) palm and coconut oils; sugary foods and beverages  Two nutrients that raise blood chol levels are saturated fats and trans fat; in hydrogenated oils and fats, as stick margarine, baked goods (cookes, cakes, pies, crackers; frosting; and coffee creamers;   Some Fats lower cholesterol:  Monounsaturated and polyunsaturated  Avocados Corn, sunflower, and soybean oils Nuts and seeds, such as walnuts Olive, canola, peanut, safflower, and sesame oils Peanut butter Salmon and trout Tofu     Glaucoma Glaucoma happens when the fluid pressure in the eyeball is too high. The pressure cannot stay high for too long, or the eyeball may become damaged. Signs of glaucoma include:  Having a hard time seeing in a dark room after being in a bright one.  Having trouble seeing things out to the sides of you.  Blurry sight.  Seeing bright white lights or colors in front of your eyes.  Headaches.  Feeling sick to your stomach (nauseous) or throwing up (vomiting).  Sudden vision loss. Glaucoma testing is an important part of taking care of your eyesight. HOME CARE  Always use your eyedrops or pills as told by your doctor. Do not run out.  Do not go away from home without your eyedrops or pills.  Keep your appointments.  Always tell a new doctor that you have glaucoma and how long you have had it. Tell the doctor about the eyedrops and pills you take.  Do not use eyedrops or pills that have not been prescribed by your doctor. GET HELP RIGHT AWAY IF:  You develop severe pain in the affected eye.  You develop vision problems.  You develop a bad headache in the area around the eye.  You  feel sick to your stomach or throw up.  You start to have problems with the other eye. MAKE SURE YOU:   Understand these instructions.  Will watch your condition.  Will get help right away if you are not doing well or get worse. Document Released: 10/29/2007 Document Revised: 04/13/2011 Document Reviewed: 10/29/2007 [facility] Patient Information 2015 [place], [place]. This information is not intended to replace advice given to you by your health care provider. Make sure you discuss any questions you have with your health care provider.  Health Maintenance Adopting a healthy lifestyle and  getting preventive care can go a long way to promote health and wellness. Talk with your health care provider about what schedule of regular examinations is right for you. This is a good chance for you to check in with your provider about disease prevention and staying healthy. In between checkups, there are plenty of things you can do on your own. Experts have done a lot of research about which lifestyle changes and preventive measures are most likely to keep you healthy. Ask your health care provider for more information. WEIGHT AND DIET  Eat a healthy diet  Be sure to include plenty of vegetables, fruits, low-fat dairy products, and lean protein.  Do not eat a lot of foods high in solid fats, added sugars, or salt.  Get regular exercise. This is one of the most important things you can do for your health.  Most adults should exercise for at least 150 minutes each week. The exercise should increase your heart rate and make you sweat (moderate-intensity exercise).  Most adults should also do strengthening exercises at least twice a week. This is in addition to the moderate-intensity exercise.  Maintain a healthy weight  Body mass index (BMI) is a measurement that can be used to identify possible weight problems. It estimates body fat based on height and weight. Your health care provider can help determine your BMI and help you achieve or maintain a healthy weight.  For females 30 years of age and older:   A BMI below 18.5 is considered underweight.  A BMI of 18.5 to 24.9 is normal.  A BMI of 25 to 29.9 is considered overweight.  A BMI of 30 and above is considered obese.  Watch levels of cholesterol and blood lipids  You should start having your blood tested for lipids and cholesterol at 71 years of age, then have this test every 5 years.  You may need to have your cholesterol levels checked more often if:  Your lipid or cholesterol levels are high.  You are older than 71 years  of age.  You are at high risk for heart disease.  CANCER SCREENING   Lung Cancer  Lung cancer screening is recommended for adults 44-42 years old who are at high risk for lung cancer because of a history of smoking.  A yearly low-dose CT scan of the lungs is recommended for people who:  Currently smoke.  Have quit within the past 15 years.  Have at least a 30-pack-year history of smoking. A pack year is smoking an average of one pack of cigarettes a day for 1 year.  Yearly screening should continue until it has been 15 years since you quit.  Yearly screening should stop if you develop a health problem that would prevent you from having lung cancer treatment.  Breast Cancer  Practice breast self-awareness. This means understanding how your breasts normally appear and feel.  It also means doing  regular breast self-exams. Let your health care provider know about any changes, no matter how small.  If you are in your 20s or 30s, you should have a clinical breast exam (CBE) by a health care provider every 1-3 years as part of a regular health exam.  If you are 43 or older, have a CBE every year. Also consider having a breast X-ray (mammogram) every year.  If you have a family history of breast cancer, talk to your health care provider about genetic screening.  If you are at high risk for breast cancer, talk to your health care provider about having an MRI and a mammogram every year.  Breast cancer gene (BRCA) assessment is recommended for women who have family members with BRCA-related cancers. BRCA-related cancers include:  Breast.  Ovarian.  Tubal.  Peritoneal cancers.  Results of the assessment will determine the need for genetic counseling and BRCA1 and BRCA2 testing. Cervical Cancer Routine pelvic examinations to screen for cervical cancer are no longer recommended for nonpregnant women who are considered low risk for cancer of the pelvic organs (ovaries, uterus, and  vagina) and who do not have symptoms. A pelvic examination may be necessary if you have symptoms including those associated with pelvic infections. Ask your health care provider if a screening pelvic exam is right for you.   The Pap test is the screening test for cervical cancer for women who are considered at risk.  If you had a hysterectomy for a problem that was not cancer or a condition that could lead to cancer, then you no longer need Pap tests.  If you are older than 65 years, and you have had normal Pap tests for the past 10 years, you no longer need to have Pap tests.  If you have had past treatment for cervical cancer or a condition that could lead to cancer, you need Pap tests and screening for cancer for at least 20 years after your treatment.  If you no longer get a Pap test, assess your risk factors if they change (such as having a new sexual partner). This can affect whether you should start being screened again.  Some women have medical problems that increase their chance of getting cervical cancer. If this is the case for you, your health care provider may recommend more frequent screening and Pap tests.  The human papillomavirus (HPV) test is another test that may be used for cervical cancer screening. The HPV test looks for the virus that can cause cell changes in the cervix. The cells collected during the Pap test can be tested for HPV.  The HPV test can be used to screen women 63 years of age and older. Getting tested for HPV can extend the interval between normal Pap tests from three to five years.  An HPV test also should be used to screen women of any age who have unclear Pap test results.  After 71 years of age, women should have HPV testing as often as Pap tests.  Colorectal Cancer  This type of cancer can be detected and often prevented.  Routine colorectal cancer screening usually begins at 71 years of age and continues through 71 years of age.  Your health  care provider may recommend screening at an earlier age if you have risk factors for colon cancer.  Your health care provider may also recommend using home test kits to check for hidden blood in the stool.  A small camera at the end of a tube  can be used to examine your colon directly (sigmoidoscopy or colonoscopy). This is done to check for the earliest forms of colorectal cancer.  Routine screening usually begins at age 69.  Direct examination of the colon should be repeated every 5-10 years through 71 years of age. However, you may need to be screened more often if early forms of precancerous polyps or small growths are found. Skin Cancer  Check your skin from head to toe regularly.  Tell your health care provider about any new moles or changes in moles, especially if there is a change in a mole's shape or color.  Also tell your health care provider if you have a mole that is larger than the size of a pencil eraser.  Always use sunscreen. Apply sunscreen liberally and repeatedly throughout the day.  Protect yourself by wearing long sleeves, pants, a wide-brimmed hat, and sunglasses whenever you are outside. HEART DISEASE, DIABETES, AND HIGH BLOOD PRESSURE   Have your blood pressure checked at least every 1-2 years. High blood pressure causes heart disease and increases the risk of stroke.  If you are between 25 years and 23 years old, ask your health care provider if you should take aspirin to prevent strokes.  Have regular diabetes screenings. This involves taking a blood sample to check your fasting blood sugar level.  If you are at a normal weight and have a low risk for diabetes, have this test once every three years after 71 years of age.  If you are overweight and have a high risk for diabetes, consider being tested at a younger age or more often. PREVENTING INFECTION  Hepatitis B  If you have a higher risk for hepatitis B, you should be screened for this virus. You are  considered at high risk for hepatitis B if:  You were born in a country where hepatitis B is common. Ask your health care provider which countries are considered high risk.  Your parents were born in a high-risk country, and you have not been immunized against hepatitis B (hepatitis B vaccine).  You have HIV or AIDS.  You use needles to inject street drugs.  You live with someone who has hepatitis B.  You have had sex with someone who has hepatitis B.  You get hemodialysis treatment.  You take certain medicines for conditions, including cancer, organ transplantation, and autoimmune conditions. Hepatitis C  Blood testing is recommended for:  Everyone born from [birth year] through 1967.  Anyone with known risk factors for hepatitis C. Sexually transmitted infections (STIs)  You should be screened for sexually transmitted infections (STIs) including gonorrhea and chlamydia if:  You are sexually active and are younger than 71 years of age.  You are older than 71 years of age and your health care provider tells you that you are at risk for this type of infection.  Your sexual activity has changed since you were last screened and you are at an increased risk for chlamydia or gonorrhea. Ask your health care provider if you are at risk.  If you do not have HIV, but are at risk, it may be recommended that you take a prescription medicine daily to prevent HIV infection. This is called pre-exposure prophylaxis (PrEP). You are considered at risk if:  You are sexually active and do not regularly use condoms or know the HIV status of your partner(s).  You take drugs by injection.  You are sexually active with a partner who has HIV. Talk with your health  care provider about whether you are at high risk of being infected with HIV. If you choose to begin PrEP, you should first be tested for HIV. You should then be tested every 3 months for as long as you are taking PrEP.  PREGNANCY   If you are  premenopausal and you may become pregnant, ask your health care provider about preconception counseling.  If you may become pregnant, take 400 to 800 micrograms (mcg) of folic acid every day.  If you want to prevent pregnancy, talk to your health care provider about birth control (contraception). OSTEOPOROSIS AND MENOPAUSE   Osteoporosis is a disease in which the bones lose minerals and strength with aging. This can result in serious bone fractures. Your risk for osteoporosis can be identified using a bone density scan.  If you are 23 years of age or older, or if you are at risk for osteoporosis and fractures, ask your health care provider if you should be screened.  Ask your health care provider whether you should take a calcium or vitamin D supplement to lower your risk for osteoporosis.  Menopause may have certain physical symptoms and risks.  Hormone replacement therapy may reduce some of these symptoms and risks. Talk to your health care provider about whether hormone replacement therapy is right for you.  HOME CARE INSTRUCTIONS   Schedule regular health, dental, and eye exams.  Stay current with your immunizations.   Do not use any tobacco products including cigarettes, chewing tobacco, or electronic cigarettes.  If you are pregnant, do not drink alcohol.  If you are breastfeeding, limit how much and how often you drink alcohol.  Limit alcohol intake to no more than 1 drink per day for nonpregnant women. One drink equals 12 ounces of beer, 5 ounces of wine, or 1 ounces of hard liquor.  Do not use street drugs.  Do not share needles.  Ask your health care provider for help if you need support or information about quitting drugs.  Tell your health care provider if you often feel depressed.  Tell your health care provider if you have ever been abused or do not feel safe at home. Document Released: 08/04/2010 Document Revised: 06/05/2013 Document Reviewed:  12/21/2012 [facility] Patient Information 2015 [place], [place]. This information is not intended to replace advice given to you by your health care provider. Make sure you discuss any questions you have with your health care provider.

## 2014-10-17 ENCOUNTER — Ambulatory Visit (INDEPENDENT_AMBULATORY_CARE_PROVIDER_SITE_OTHER): Payer: Commercial Managed Care - HMO | Admitting: Internal Medicine

## 2014-10-17 ENCOUNTER — Encounter: Payer: Self-pay | Admitting: Internal Medicine

## 2014-10-17 ENCOUNTER — Other Ambulatory Visit (INDEPENDENT_AMBULATORY_CARE_PROVIDER_SITE_OTHER): Payer: Commercial Managed Care - HMO

## 2014-10-17 VITALS — BP 120/58 | HR 63 | Temp 97.9°F | Resp 16 | Ht 63.0 in | Wt 177.0 lb

## 2014-10-17 DIAGNOSIS — E118 Type 2 diabetes mellitus with unspecified complications: Secondary | ICD-10-CM

## 2014-10-17 DIAGNOSIS — M8580 Other specified disorders of bone density and structure, unspecified site: Secondary | ICD-10-CM

## 2014-10-17 DIAGNOSIS — I10 Essential (primary) hypertension: Secondary | ICD-10-CM | POA: Diagnosis not present

## 2014-10-17 DIAGNOSIS — Z0000 Encounter for general adult medical examination without abnormal findings: Secondary | ICD-10-CM

## 2014-10-17 DIAGNOSIS — Z23 Encounter for immunization: Secondary | ICD-10-CM | POA: Diagnosis not present

## 2014-10-17 DIAGNOSIS — Z1239 Encounter for other screening for malignant neoplasm of breast: Secondary | ICD-10-CM

## 2014-10-17 DIAGNOSIS — E781 Pure hyperglyceridemia: Secondary | ICD-10-CM

## 2014-10-17 DIAGNOSIS — I2510 Atherosclerotic heart disease of native coronary artery without angina pectoris: Secondary | ICD-10-CM | POA: Diagnosis not present

## 2014-10-17 DIAGNOSIS — Z1211 Encounter for screening for malignant neoplasm of colon: Secondary | ICD-10-CM

## 2014-10-17 LAB — URINALYSIS, ROUTINE W REFLEX MICROSCOPIC
Bilirubin Urine: NEGATIVE
HGB URINE DIPSTICK: NEGATIVE
Ketones, ur: NEGATIVE
NITRITE: NEGATIVE
RBC / HPF: NONE SEEN (ref 0–?)
Specific Gravity, Urine: 1.025 (ref 1.000–1.030)
TOTAL PROTEIN, URINE-UPE24: NEGATIVE
URINE GLUCOSE: NEGATIVE
Urobilinogen, UA: 0.2 (ref 0.0–1.0)
pH: 6 (ref 5.0–8.0)

## 2014-10-17 LAB — COMPREHENSIVE METABOLIC PANEL
ALK PHOS: 55 U/L (ref 39–117)
ALT: 16 U/L (ref 0–35)
AST: 16 U/L (ref 0–37)
Albumin: 4.6 g/dL (ref 3.5–5.2)
BILIRUBIN TOTAL: 0.4 mg/dL (ref 0.2–1.2)
BUN: 19 mg/dL (ref 6–23)
CO2: 28 mEq/L (ref 19–32)
CREATININE: 0.63 mg/dL (ref 0.40–1.20)
Calcium: 9.6 mg/dL (ref 8.4–10.5)
Chloride: 102 mEq/L (ref 96–112)
GFR: 98.94 mL/min (ref >60.00)
GLUCOSE: 99 mg/dL (ref 70–99)
Potassium: 4.2 mEq/L (ref 3.5–5.1)
Sodium: 139 mEq/L (ref 135–145)
TOTAL PROTEIN: 7.2 g/dL (ref 6.0–8.3)

## 2014-10-17 LAB — CBC WITH DIFFERENTIAL/PLATELET
BASOS ABS: 0 10*3/uL (ref 0.0–0.1)
Basophils Relative: 0.5 % (ref 0.0–3.0)
EOS ABS: 0.1 10*3/uL (ref 0.0–0.7)
Eosinophils Relative: 1.3 % (ref 0.0–5.0)
HCT: 39.6 % (ref 36.0–46.0)
HEMOGLOBIN: 13.4 g/dL (ref 12.0–15.0)
Lymphocytes Relative: 29.4 % (ref 12.0–46.0)
Lymphs Abs: 2.3 10*3/uL (ref 0.7–4.0)
MCHC: 33.7 g/dL (ref 30.0–36.0)
MCV: 89.4 fl (ref 78.0–100.0)
MONO ABS: 0.6 10*3/uL (ref 0.1–1.0)
Monocytes Relative: 7.5 % (ref 3.0–12.0)
Neutro Abs: 4.8 10*3/uL (ref 1.4–7.7)
Neutrophils Relative %: 61.3 % (ref 43.0–77.0)
Platelets: 232 10*3/uL (ref 150.0–400.0)
RBC: 4.43 Mil/uL (ref 3.87–5.11)
RDW: 12.6 % (ref 11.5–15.5)
WBC: 7.8 10*3/uL (ref 4.0–10.5)

## 2014-10-17 LAB — MICROALBUMIN / CREATININE URINE RATIO
Creatinine,U: 87.4 mg/dL
MICROALB/CREAT RATIO: 0.9 mg/g (ref 0.0–30.0)
Microalb, Ur: 0.8 mg/dL (ref 0.0–1.9)

## 2014-10-17 LAB — HEMOGLOBIN A1C: Hgb A1c MFr Bld: 6.8 % — ABNORMAL HIGH (ref 4.6–6.5)

## 2014-10-17 LAB — TSH: TSH: 4.31 u[IU]/mL (ref 0.35–4.50)

## 2014-10-17 MED ORDER — ICOSAPENT ETHYL 1 G PO CAPS: 2.0000 | ORAL_CAPSULE | Freq: Two times a day (BID) | ORAL | Status: DC

## 2014-10-17 NOTE — Progress Notes (Signed)
Subjective:  Patient ID: Heather Crawford, female    DOB: July 08, 1945  Age: 69 y.o. MRN: [id number]  CC: Annual Exam; Hypertension; Diabetes; and Hyperlipidemia   HPI Heather Crawford presents for a complete physical and follow-up. She needs a referral for a colonoscopy and a diabetic eye exam. She feels well and offers no complaints. She frequently travels to [place] and request to be vaccinated against hepatitis A and B.  Outpatient Prescriptions Prior to Visit  Medication Sig Dispense Refill  . aspirin 325 MG tablet Take 325 mg by mouth daily.    Marland Kitchen azithromycin (ZITHROMAX) 500 MG tablet TAKE 1 500 MG TABLET 1 HR PRIOR TO DENTAL WORK 1 tablet 0  . carvedilol (COREG) 6.25 MG tablet TAKE 1 TABLET TWICE DAILY WITH MEALS 180 tablet 2  . cetirizine (ZYRTEC) 10 MG tablet Take 10 mg by mouth daily.    . fluticasone (FLONASE) 50 MCG/ACT nasal spray Place 1 spray into both nostrils daily. 29.7 g 3  . furosemide (LASIX) 20 MG tablet TAKE 1 TABLET EVERY DAY  FOR  FLUID  RETENTION 90 tablet 2  . loratadine (CLARITIN) 10 MG tablet Take 10 mg by mouth daily.    . meclizine (ANTIVERT) 25 MG tablet Take 1 tablet (25 mg total) by mouth 3 (three) times daily. May take 1/2 tablet if you wish. 270 tablet 0  . potassium chloride (K-DUR) 10 MEQ tablet Take 1 tablet (10 mEq total) by mouth daily. 90 tablet 2  . ranitidine (ZANTAC) 150 MG tablet Take 150 mg by mouth daily as needed. For reflux    . simvastatin (ZOCOR) 40 MG tablet TAKE 1 TABLET AT BEDTIME 90 tablet 0   No facility-administered medications prior to visit.    ROS Review of Systems  Constitutional: Negative for fever, chills, diaphoresis, appetite change and fatigue.  HENT: Negative.  Negative for trouble swallowing and voice change.   Eyes: Negative.  Negative for photophobia, redness and visual disturbance.  Respiratory: Negative.  Negative for cough, choking, chest tightness, shortness of breath and stridor.   Cardiovascular:  Negative.  Negative for chest pain, palpitations and leg swelling.  Gastrointestinal: Negative.  Negative for nausea, vomiting, abdominal pain, diarrhea, constipation and blood in stool.  Endocrine: Negative.  Negative for polydipsia, polyphagia and polyuria.  Genitourinary: Negative.  Negative for urgency and difficulty urinating.  Musculoskeletal: Negative.   Skin: Negative.   Allergic/Immunologic: Negative.   Neurological: Negative.  Negative for dizziness, tremors, weakness, light-headedness and headaches.  Hematological: Negative.   Psychiatric/Behavioral: Negative.     Objective:  BP 120/58 mmHg  Pulse 63  Temp(Src) 97.9 F (36.6 C) (Oral)  Resp 16  Ht 5\' 3"  (1.6 m)  Wt 177 lb (80.287 kg)  BMI 31.36 kg/m2  SpO2 94%  BP Readings from Last 3 Encounters:  10/17/14 120/58  10/11/14 134/70  08/13/14 118/54    Wt Readings from Last 3 Encounters:  10/17/14 177 lb (80.287 kg)  10/11/14 173 lb 8 oz (78.699 kg)  08/13/14 176 lb (79.833 kg)    Physical Exam  Constitutional: She is oriented to person, place, and time. She appears well-developed and well-nourished. No distress.  HENT:  Head: Normocephalic and atraumatic.  Mouth/Throat: Oropharynx is clear and moist. No oropharyngeal exudate.  Eyes: Conjunctivae are normal. Right eye exhibits no discharge. Left eye exhibits no discharge. No scleral icterus.  Neck: Normal range of motion. Neck supple. No JVD present. No tracheal deviation present. No thyromegaly present.  Cardiovascular: Normal rate,  regular rhythm, S1 normal and S2 normal.  Exam reveals no gallop and no friction rub.   Murmur heard.  Systolic murmur is present with a grade of 2/6   No diastolic murmur is present  Pulmonary/Chest: Effort normal and breath sounds normal. No stridor. No respiratory distress. She has no wheezes. She has no rales. She exhibits no tenderness.  Abdominal: Soft. Bowel sounds are normal. She exhibits no distension and no mass. There is  no tenderness. There is no rebound and no guarding.  Musculoskeletal: Normal range of motion. She exhibits no edema or tenderness.  Lymphadenopathy:    She has no cervical adenopathy.  Neurological: She is oriented to person, place, and time.  Skin: Skin is warm and dry. No rash noted. She is not diaphoretic. No erythema. No pallor.  Psychiatric: She has a normal mood and affect. Her behavior is normal. Judgment and thought content normal.  Vitals reviewed.   Lab Results  Component Value Date   WBC 7.8 10/17/2014   HGB 13.4 10/17/2014   HCT 39.6 10/17/2014   PLT 232.0 10/17/2014   GLUCOSE 99 10/17/2014   CHOL 161 08/13/2014   TRIG 300* 08/13/2014   HDL 36* 08/13/2014   LDLDIRECT 100.9 08/15/2012   LDLCALC 65 08/13/2014   ALT 16 10/17/2014   AST 16 10/17/2014   NA 139 10/17/2014   K 4.2 10/17/2014   CL 102 10/17/2014   CREATININE 0.63 10/17/2014   BUN 19 10/17/2014   CO2 28 10/17/2014   TSH 4.31 10/17/2014   INR 3.1 06/18/2010   HGBA1C 6.8* 10/17/2014   MICROALBUR 0.8 10/17/2014    No results found.  Assessment & Plan:   Heather Crawford was seen today for annual exam, hypertension, diabetes and hyperlipidemia.  Diagnoses and all orders for this visit:  Type II diabetes mellitus with manifestations- her blood sugars are adequately well controlled, at this time she does not need to be on a medication, her renal function is normal, she was referred for an annual diabetic eye exam. -     CBC with Differential/Platelet; Future -     Hemoglobin A1c; Future -     Microalbumin / creatinine urine ratio; Future -     Comprehensive metabolic panel; Future -     Ambulatory referral to Ophthalmology  Essential hypertension- her blood pressure is well-controlled, electrolytes and renal function are stable. -     TSH; Future -     Urinalysis, Routine w reflex microscopic (not at [facility]); Future -     Comprehensive metabolic panel; Future  Atherosclerosis of native coronary artery of  native heart without angina pectoris -     Icosapent Ethyl (VASCEPA) 1 G CAPS; Take 2 capsules by mouth 2 (two) times daily.  Osteopenia  Routine health maintenance -     Hepatitis C antibody; Future  Hypertriglyceridemia- this represents an independent risk factor for coronary artery disease and stroke. Will treat with Vascepa. -     Icosapent Ethyl (VASCEPA) 1 G CAPS; Take 2 capsules by mouth 2 (two) times daily.  Colon cancer screening -     Ambulatory referral to Gastroenterology  Breast cancer screening, high risk patient -     MM DIGITAL SCREENING BILATERAL; Future  Need for prophylactic vaccination and inoculation against viral hepatitis -     Hepatitis A hepatitis B combined vaccine IM  Other orders -     Cancel: Ambulatory referral to Gastroenterology   I am having Ms. Pieri start on Icosapent Ethyl.  I am also having her maintain her ranitidine, aspirin, loratadine, cetirizine, meclizine, potassium chloride, azithromycin, fluticasone, furosemide, carvedilol, and simvastatin.  Meds ordered this encounter  Medications  . Icosapent Ethyl (VASCEPA) 1 G CAPS    Sig: Take 2 capsules by mouth 2 (two) times daily.    Dispense:  360 capsule    Refill:  3   See AVS for instructions about healthy living and anticipatory guidance.  Follow-up: Return in about 4 weeks (around 11/14/2014).  Scarlette Calico, MD

## 2014-10-17 NOTE — Progress Notes (Signed)
Pre visit review using our clinic review tool, if applicable. No additional management support is needed unless otherwise documented below in the visit note. 

## 2014-10-17 NOTE — Patient Instructions (Signed)
Preventive Care for Adults A healthy lifestyle and preventive care can promote health and wellness. Preventive health guidelines for women include the following key practices.  A routine yearly physical is a good way to check with your health care provider about your health and preventive screening. It is a chance to share any concerns and updates on your health and to receive a thorough exam.  Visit your dentist for a routine exam and preventive care every 6 months. Brush your teeth twice a day and floss once a day. Good oral hygiene prevents tooth decay and gum disease.  The frequency of eye exams is based on your age, health, family medical history, use of contact lenses, and other factors. Follow your health care provider's recommendations for frequency of eye exams.  Eat a healthy diet. Foods like vegetables, fruits, whole grains, low-fat dairy products, and lean protein foods contain the nutrients you need without too many calories. Decrease your intake of foods high in solid fats, added sugars, and salt. Eat the right amount of calories for you.Get information about a proper diet from your health care provider, if necessary.  Regular physical exercise is one of the most important things you can do for your health. Most adults should get at least 150 minutes of moderate-intensity exercise (any activity that increases your heart rate and causes you to sweat) each week. In addition, most adults need muscle-strengthening exercises on 2 or more days a week.  Maintain a healthy weight. The body mass index (BMI) is a screening tool to identify possible weight problems. It provides an estimate of body fat based on height and weight. Your health care provider can find your BMI and can help you achieve or maintain a healthy weight.For adults 20 years and older:  A BMI below 18.5 is considered underweight.  A BMI of 18.5 to 24.9 is normal.  A BMI of 25 to 29.9 is considered overweight.  A BMI of  30 and above is considered obese.  Maintain normal blood lipids and cholesterol levels by exercising and minimizing your intake of saturated fat. Eat a balanced diet with plenty of fruit and vegetables. Blood tests for lipids and cholesterol should begin at age 76 and be repeated every 5 years. If your lipid or cholesterol levels are high, you are over 50, or you are at high risk for heart disease, you may need your cholesterol levels checked more frequently.Ongoing high lipid and cholesterol levels should be treated with medicines if diet and exercise are not working.  If you smoke, find out from your health care provider how to quit. If you do not use tobacco, do not start.  Lung cancer screening is recommended for adults aged 22-80 years who are at high risk for developing lung cancer because of a history of smoking. A yearly low-dose CT scan of the lungs is recommended for people who have at least a 30-pack-year history of smoking and are a current smoker or have quit within the past 15 years. A pack year of smoking is smoking an average of 1 pack of cigarettes a day for 1 year (for example: 1 pack a day for 30 years or 2 packs a day for 15 years). Yearly screening should continue until the smoker has stopped smoking for at least 15 years. Yearly screening should be stopped for people who develop a health problem that would prevent them from having lung cancer treatment.  If you are pregnant, do not drink alcohol. If you are breastfeeding,  be very cautious about drinking alcohol. If you are not pregnant and choose to drink alcohol, do not have more than 1 drink per day. One drink is considered to be 12 ounces (355 mL) of beer, 5 ounces (148 mL) of wine, or 1.5 ounces (44 mL) of liquor.  Avoid use of street drugs. Do not share needles with anyone. Ask for help if you need support or instructions about stopping the use of drugs.  High blood pressure causes heart disease and increases the risk of  stroke. Your blood pressure should be checked at least every 1 to 2 years. Ongoing high blood pressure should be treated with medicines if weight loss and exercise do not work.  If you are 75-52 years old, ask your health care provider if you should take aspirin to prevent strokes.  Diabetes screening involves taking a blood sample to check your fasting blood sugar level. This should be done once every 3 years, after age 15, if you are within normal weight and without risk factors for diabetes. Testing should be considered at a younger age or be carried out more frequently if you are overweight and have at least 1 risk factor for diabetes.  Breast cancer screening is essential preventive care for women. You should practice "breast self-awareness." This means understanding the normal appearance and feel of your breasts and may include breast self-examination. Any changes detected, no matter how small, should be reported to a health care provider. Women in their 58s and 30s should have a clinical breast exam (CBE) by a health care provider as part of a regular health exam every 1 to 3 years. After age 16, women should have a CBE every year. Starting at age 53, women should consider having a mammogram (breast X-ray test) every year. Women who have a family history of breast cancer should talk to their health care provider about genetic screening. Women at a high risk of breast cancer should talk to their health care providers about having an MRI and a mammogram every year.  Breast cancer gene (BRCA)-related cancer risk assessment is recommended for women who have family members with BRCA-related cancers. BRCA-related cancers include breast, ovarian, tubal, and peritoneal cancers. Having family members with these cancers may be associated with an increased risk for harmful changes (mutations) in the breast cancer genes BRCA1 and BRCA2. Results of the assessment will determine the need for genetic counseling and  BRCA1 and BRCA2 testing.  Routine pelvic exams to screen for cancer are no longer recommended for nonpregnant women who are considered low risk for cancer of the pelvic organs (ovaries, uterus, and vagina) and who do not have symptoms. Ask your health care provider if a screening pelvic exam is right for you.  If you have had past treatment for cervical cancer or a condition that could lead to cancer, you need Pap tests and screening for cancer for at least 20 years after your treatment. If Pap tests have been discontinued, your risk factors (such as having a new sexual partner) need to be reassessed to determine if screening should be resumed. Some women have medical problems that increase the chance of getting cervical cancer. In these cases, your health care provider may recommend more frequent screening and Pap tests.  The HPV test is an additional test that may be used for cervical cancer screening. The HPV test looks for the virus that can cause the cell changes on the cervix. The cells collected during the Pap test can be  tested for HPV. The HPV test could be used to screen women aged 30 years and older, and should be used in women of any age who have unclear Pap test results. After the age of 30, women should have HPV testing at the same frequency as a Pap test.  Colorectal cancer can be detected and often prevented. Most routine colorectal cancer screening begins at the age of 50 years and continues through age 75 years. However, your health care provider may recommend screening at an earlier age if you have risk factors for colon cancer. On a yearly basis, your health care provider may provide home test kits to check for hidden blood in the stool. Use of a small camera at the end of a tube, to directly examine the colon (sigmoidoscopy or colonoscopy), can detect the earliest forms of colorectal cancer. Talk to your health care provider about this at age 50, when routine screening begins. Direct  exam of the colon should be repeated every 5-10 years through age 75 years, unless early forms of pre-cancerous polyps or small growths are found.  People who are at an increased risk for hepatitis B should be screened for this virus. You are considered at high risk for hepatitis B if:  You were born in a country where hepatitis B occurs often. Talk with your health care provider about which countries are considered high risk.  Your parents were born in a high-risk country and you have not received a shot to protect against hepatitis B (hepatitis B vaccine).  You have HIV or AIDS.  You use needles to inject street drugs.  You live with, or have sex with, someone who has hepatitis B.  You get hemodialysis treatment.  You take certain medicines for conditions like cancer, organ transplantation, and autoimmune conditions.  Hepatitis C blood testing is recommended for all people born from 1945 through 1965 and any individual with known risks for hepatitis C.  Practice safe sex. Use condoms and avoid high-risk sexual practices to reduce the spread of sexually transmitted infections (STIs). STIs include gonorrhea, chlamydia, syphilis, trichomonas, herpes, HPV, and human immunodeficiency virus (HIV). Herpes, HIV, and HPV are viral illnesses that have no cure. They can result in disability, cancer, and death.  You should be screened for sexually transmitted illnesses (STIs) including gonorrhea and chlamydia if:  You are sexually active and are younger than 24 years.  You are older than 24 years and your health care provider tells you that you are at risk for this type of infection.  Your sexual activity has changed since you were last screened and you are at an increased risk for chlamydia or gonorrhea. Ask your health care provider if you are at risk.  If you are at risk of being infected with HIV, it is recommended that you take a prescription medicine daily to prevent HIV infection. This is  called preexposure prophylaxis (PrEP). You are considered at risk if:  You are a heterosexual woman, are sexually active, and are at increased risk for HIV infection.  You take drugs by injection.  You are sexually active with a partner who has HIV.  Talk with your health care provider about whether you are at high risk of being infected with HIV. If you choose to begin PrEP, you should first be tested for HIV. You should then be tested every 3 months for as long as you are taking PrEP.  Osteoporosis is a disease in which the bones lose minerals and strength   with aging. This can result in serious bone fractures or breaks. The risk of osteoporosis can be identified using a bone density scan. Women ages 65 years and over and women at risk for fractures or osteoporosis should discuss screening with their health care providers. Ask your health care provider whether you should take a calcium supplement or vitamin D to reduce the rate of osteoporosis.  Menopause can be associated with physical symptoms and risks. Hormone replacement therapy is available to decrease symptoms and risks. You should talk to your health care provider about whether hormone replacement therapy is right for you.  Use sunscreen. Apply sunscreen liberally and repeatedly throughout the day. You should seek shade when your shadow is shorter than you. Protect yourself by wearing long sleeves, pants, a wide-brimmed hat, and sunglasses year round, whenever you are outdoors.  Once a month, do a whole body skin exam, using a mirror to look at the skin on your back. Tell your health care provider of new moles, moles that have irregular borders, moles that are larger than a pencil eraser, or moles that have changed in shape or color.  Stay current with required vaccines (immunizations).  Influenza vaccine. All adults should be immunized every year.  Tetanus, diphtheria, and acellular pertussis (Td, Tdap) vaccine. Pregnant women should  receive 1 dose of Tdap vaccine during each pregnancy. The dose should be obtained regardless of the length of time since the last dose. Immunization is preferred during the 27th-36th week of gestation. An adult who has not previously received Tdap or who does not know her vaccine status should receive 1 dose of Tdap. This initial dose should be followed by tetanus and diphtheria toxoids (Td) booster doses every 10 years. Adults with an unknown or incomplete history of completing a 3-dose immunization series with Td-containing vaccines should begin or complete a primary immunization series including a Tdap dose. Adults should receive a Td booster every 10 years.  Varicella vaccine. An adult without evidence of immunity to varicella should receive 2 doses or a second dose if she has previously received 1 dose. Pregnant females who do not have evidence of immunity should receive the first dose after pregnancy. This first dose should be obtained before leaving the health care facility. The second dose should be obtained 4-8 weeks after the first dose.  Human papillomavirus (HPV) vaccine. Females aged 13-26 years who have not received the vaccine previously should obtain the 3-dose series. The vaccine is not recommended for use in pregnant females. However, pregnancy testing is not needed before receiving a dose. If a female is found to be pregnant after receiving a dose, no treatment is needed. In that case, the remaining doses should be delayed until after the pregnancy. Immunization is recommended for any person with an immunocompromised condition through the age of 26 years if she did not get any or all doses earlier. During the 3-dose series, the second dose should be obtained 4-8 weeks after the first dose. The third dose should be obtained 24 weeks after the first dose and 16 weeks after the second dose.  Zoster vaccine. One dose is recommended for adults aged 60 years or older unless certain conditions are  present.  Measles, mumps, and rubella (MMR) vaccine. Adults born before 1957 generally are considered immune to measles and mumps. Adults born in 1957 or later should have 1 or more doses of MMR vaccine unless there is a contraindication to the vaccine or there is laboratory evidence of immunity to   each of the three diseases. A routine second dose of MMR vaccine should be obtained at least 28 days after the first dose for students attending postsecondary schools, health care workers, or international travelers. People who received inactivated measles vaccine or an unknown type of measles vaccine during 1963-1967 should receive 2 doses of MMR vaccine. People who received inactivated mumps vaccine or an unknown type of mumps vaccine before 1979 and are at high risk for mumps infection should consider immunization with 2 doses of MMR vaccine. For females of childbearing age, rubella immunity should be determined. If there is no evidence of immunity, females who are not pregnant should be vaccinated. If there is no evidence of immunity, females who are pregnant should delay immunization until after pregnancy. Unvaccinated health care workers born before 1957 who lack laboratory evidence of measles, mumps, or rubella immunity or laboratory confirmation of disease should consider measles and mumps immunization with 2 doses of MMR vaccine or rubella immunization with 1 dose of MMR vaccine.  Pneumococcal 13-valent conjugate (PCV13) vaccine. When indicated, a person who is uncertain of her immunization history and has no record of immunization should receive the PCV13 vaccine. An adult aged 19 years or older who has certain medical conditions and has not been previously immunized should receive 1 dose of PCV13 vaccine. This PCV13 should be followed with a dose of pneumococcal polysaccharide (PPSV23) vaccine. The PPSV23 vaccine dose should be obtained at least 8 weeks after the dose of PCV13 vaccine. An adult aged 19  years or older who has certain medical conditions and previously received 1 or more doses of PPSV23 vaccine should receive 1 dose of PCV13. The PCV13 vaccine dose should be obtained 1 or more years after the last PPSV23 vaccine dose.  Pneumococcal polysaccharide (PPSV23) vaccine. When PCV13 is also indicated, PCV13 should be obtained first. All adults aged 65 years and older should be immunized. An adult younger than age 65 years who has certain medical conditions should be immunized. Any person who resides in a nursing home or long-term care facility should be immunized. An adult smoker should be immunized. People with an immunocompromised condition and certain other conditions should receive both PCV13 and PPSV23 vaccines. People with human immunodeficiency virus (HIV) infection should be immunized as soon as possible after diagnosis. Immunization during chemotherapy or radiation therapy should be avoided. Routine use of PPSV23 vaccine is not recommended for American Indians, [place] Natives, or people younger than 65 years unless there are medical conditions that require PPSV23 vaccine. When indicated, people who have unknown immunization and have no record of immunization should receive PPSV23 vaccine. One-time revaccination 5 years after the first dose of PPSV23 is recommended for people aged 19-64 years who have chronic kidney failure, nephrotic syndrome, asplenia, or immunocompromised conditions. People who received 1-2 doses of PPSV23 before age 65 years should receive another dose of PPSV23 vaccine at age 65 years or later if at least 5 years have passed since the previous dose. Doses of PPSV23 are not needed for people immunized with PPSV23 at or after age 65 years.  Meningococcal vaccine. Adults with asplenia or persistent complement component deficiencies should receive 2 doses of quadrivalent meningococcal conjugate (MenACWY-D) vaccine. The doses should be obtained at least 2 months apart.  Microbiologists working with certain meningococcal bacteria, military recruits, people at risk during an outbreak, and people who travel to or live in countries with a high rate of meningitis should be immunized. A first-year college student up through age   21 years who is living in a residence hall should receive a dose if she did not receive a dose on or after her 16th birthday. Adults who have certain high-risk conditions should receive one or more doses of vaccine.  Hepatitis A vaccine. Adults who wish to be protected from this disease, have certain high-risk conditions, work with hepatitis A-infected animals, work in hepatitis A research labs, or travel to or work in countries with a high rate of hepatitis A should be immunized. Adults who were previously unvaccinated and who anticipate close contact with an international adoptee during the first 60 days after arrival in the [place] from a country with a high rate of hepatitis A should be immunized.  Hepatitis B vaccine. Adults who wish to be protected from this disease, have certain high-risk conditions, may be exposed to blood or other infectious body fluids, are household contacts or sex partners of hepatitis B positive people, are clients or workers in certain care facilities, or travel to or work in countries with a high rate of hepatitis B should be immunized.  Haemophilus influenzae type b (Hib) vaccine. A previously unvaccinated person with asplenia or sickle cell disease or having a scheduled splenectomy should receive 1 dose of Hib vaccine. Regardless of previous immunization, a recipient of a hematopoietic stem cell transplant should receive a 3-dose series 6-12 months after her successful transplant. Hib vaccine is not recommended for adults with HIV infection. Preventive Services / Frequency Ages 64 to 68 years  Blood pressure check.** / Every 1 to 2 years.  Lipid and cholesterol check.** / Every 5 years beginning at age  22.  Clinical breast exam.** / Every 3 years for women in their 88s and 53s.  BRCA-related cancer risk assessment.** / For women who have family members with a BRCA-related cancer (breast, ovarian, tubal, or peritoneal cancers).  Pap test.** / Every 2 years from ages 90 through 51. Every 3 years starting at age 21 through age 56 or 3 with a history of 3 consecutive normal Pap tests.  HPV screening.** / Every 3 years from ages 24 through ages 1 to 46 with a history of 3 consecutive normal Pap tests.  Hepatitis C blood test.** / For any individual with known risks for hepatitis C.  Skin self-exam. / Monthly.  Influenza vaccine. / Every year.  Tetanus, diphtheria, and acellular pertussis (Tdap, Td) vaccine.** / Consult your health care provider. Pregnant women should receive 1 dose of Tdap vaccine during each pregnancy. 1 dose of Td every 10 years.  Varicella vaccine.** / Consult your health care provider. Pregnant females who do not have evidence of immunity should receive the first dose after pregnancy.  HPV vaccine. / 3 doses over 6 months, if 72 and younger. The vaccine is not recommended for use in pregnant females. However, pregnancy testing is not needed before receiving a dose.  Measles, mumps, rubella (MMR) vaccine.** / You need at least 1 dose of MMR if you were born in 1957 or later. You may also need a 2nd dose. For females of childbearing age, rubella immunity should be determined. If there is no evidence of immunity, females who are not pregnant should be vaccinated. If there is no evidence of immunity, females who are pregnant should delay immunization until after pregnancy.  Pneumococcal 13-valent conjugate (PCV13) vaccine.** / Consult your health care provider.  Pneumococcal polysaccharide (PPSV23) vaccine.** / 1 to 2 doses if you smoke cigarettes or if you have certain conditions.  Meningococcal vaccine.** /  1 dose if you are age 19 to 21 years and a first-year college  student living in a residence hall, or have one of several medical conditions, you need to get vaccinated against meningococcal disease. You may also need additional booster doses.  Hepatitis A vaccine.** / Consult your health care provider.  Hepatitis B vaccine.** / Consult your health care provider.  Haemophilus influenzae type b (Hib) vaccine.** / Consult your health care provider. Ages 40 to 64 years  Blood pressure check.** / Every 1 to 2 years.  Lipid and cholesterol check.** / Every 5 years beginning at age 20 years.  Lung cancer screening. / Every year if you are aged 55-80 years and have a 30-pack-year history of smoking and currently smoke or have quit within the past 15 years. Yearly screening is stopped once you have quit smoking for at least 15 years or develop a health problem that would prevent you from having lung cancer treatment.  Clinical breast exam.** / Every year after age 40 years.  BRCA-related cancer risk assessment.** / For women who have family members with a BRCA-related cancer (breast, ovarian, tubal, or peritoneal cancers).  Mammogram.** / Every year beginning at age 40 years and continuing for as long as you are in good health. Consult with your health care provider.  Pap test.** / Every 3 years starting at age 30 years through age 65 or 70 years with a history of 3 consecutive normal Pap tests.  HPV screening.** / Every 3 years from ages 30 years through ages 65 to 70 years with a history of 3 consecutive normal Pap tests.  Fecal occult blood test (FOBT) of stool. / Every year beginning at age 50 years and continuing until age 75 years. You may not need to do this test if you get a colonoscopy every 10 years.  Flexible sigmoidoscopy or colonoscopy.** / Every 5 years for a flexible sigmoidoscopy or every 10 years for a colonoscopy beginning at age 50 years and continuing until age 75 years.  Hepatitis C blood test.** / For all people born from 1945 through  1965 and any individual with known risks for hepatitis C.  Skin self-exam. / Monthly.  Influenza vaccine. / Every year.  Tetanus, diphtheria, and acellular pertussis (Tdap/Td) vaccine.** / Consult your health care provider. Pregnant women should receive 1 dose of Tdap vaccine during each pregnancy. 1 dose of Td every 10 years.  Varicella vaccine.** / Consult your health care provider. Pregnant females who do not have evidence of immunity should receive the first dose after pregnancy.  Zoster vaccine.** / 1 dose for adults aged 60 years or older.  Measles, mumps, rubella (MMR) vaccine.** / You need at least 1 dose of MMR if you were born in 1957 or later. You may also need a 2nd dose. For females of childbearing age, rubella immunity should be determined. If there is no evidence of immunity, females who are not pregnant should be vaccinated. If there is no evidence of immunity, females who are pregnant should delay immunization until after pregnancy.  Pneumococcal 13-valent conjugate (PCV13) vaccine.** / Consult your health care provider.  Pneumococcal polysaccharide (PPSV23) vaccine.** / 1 to 2 doses if you smoke cigarettes or if you have certain conditions.  Meningococcal vaccine.** / Consult your health care provider.  Hepatitis A vaccine.** / Consult your health care provider.  Hepatitis B vaccine.** / Consult your health care provider.  Haemophilus influenzae type b (Hib) vaccine.** / Consult your health care provider. Ages 65   years and over  Blood pressure check.** / Every 1 to 2 years.  Lipid and cholesterol check.** / Every 5 years beginning at age 22 years.  Lung cancer screening. / Every year if you are aged 73-80 years and have a 30-pack-year history of smoking and currently smoke or have quit within the past 15 years. Yearly screening is stopped once you have quit smoking for at least 15 years or develop a health problem that would prevent you from having lung cancer  treatment.  Clinical breast exam.** / Every year after age 4 years.  BRCA-related cancer risk assessment.** / For women who have family members with a BRCA-related cancer (breast, ovarian, tubal, or peritoneal cancers).  Mammogram.** / Every year beginning at age 40 years and continuing for as long as you are in good health. Consult with your health care provider.  Pap test.** / Every 3 years starting at age 9 years through age 34 or 91 years with 3 consecutive normal Pap tests. Testing can be stopped between 65 and 70 years with 3 consecutive normal Pap tests and no abnormal Pap or HPV tests in the past 10 years.  HPV screening.** / Every 3 years from ages 57 years through ages 64 or 45 years with a history of 3 consecutive normal Pap tests. Testing can be stopped between 65 and 70 years with 3 consecutive normal Pap tests and no abnormal Pap or HPV tests in the past 10 years.  Fecal occult blood test (FOBT) of stool. / Every year beginning at age 15 years and continuing until age 17 years. You may not need to do this test if you get a colonoscopy every 10 years.  Flexible sigmoidoscopy or colonoscopy.** / Every 5 years for a flexible sigmoidoscopy or every 10 years for a colonoscopy beginning at age 86 years and continuing until age 71 years.  Hepatitis C blood test.** / For all people born from [birth year] through 1967 and any individual with known risks for hepatitis C.  Osteoporosis screening.** / A one-time screening for women ages 83 years and over and women at risk for fractures or osteoporosis.  Skin self-exam. / Monthly.  Influenza vaccine. / Every year.  Tetanus, diphtheria, and acellular pertussis (Tdap/Td) vaccine.** / 1 dose of Td every 10 years.  Varicella vaccine.** / Consult your health care provider.  Zoster vaccine.** / 1 dose for adults aged 61 years or older.  Pneumococcal 13-valent conjugate (PCV13) vaccine.** / Consult your health care provider.  Pneumococcal  polysaccharide (PPSV23) vaccine.** / 1 dose for all adults aged 28 years and older.  Meningococcal vaccine.** / Consult your health care provider.  Hepatitis A vaccine.** / Consult your health care provider.  Hepatitis B vaccine.** / Consult your health care provider.  Haemophilus influenzae type b (Hib) vaccine.** / Consult your health care provider. ** Family history and personal history of risk and conditions may change your health care provider's recommendations. Document Released: 03/17/2001 Document Revised: 06/05/2013 Document Reviewed: 06/16/2010 [facility] Patient Information 2015 [place], [place]. This information is not intended to replace advice given to you by your health care provider. Make sure you discuss any questions you have with your health care provider.

## 2014-10-18 ENCOUNTER — Encounter: Payer: Self-pay | Admitting: Internal Medicine

## 2014-10-18 LAB — HEPATITIS C ANTIBODY: HCV Ab: NEGATIVE

## 2014-10-24 DIAGNOSIS — Z780 Asymptomatic menopausal state: Secondary | ICD-10-CM | POA: Diagnosis not present

## 2014-10-24 DIAGNOSIS — N62 Hypertrophy of breast: Secondary | ICD-10-CM | POA: Diagnosis not present

## 2014-10-24 LAB — HM DEXA SCAN

## 2014-10-24 LAB — HM MAMMOGRAPHY

## 2014-10-25 ENCOUNTER — Encounter: Payer: Self-pay | Admitting: Internal Medicine

## 2014-10-25 ENCOUNTER — Other Ambulatory Visit: Payer: Self-pay

## 2014-10-25 ENCOUNTER — Other Ambulatory Visit (HOSPITAL_COMMUNITY): Payer: Self-pay | Admitting: *Deleted

## 2014-10-25 MED ORDER — CARVEDILOL 6.25 MG PO TABS: 6.2500 mg | ORAL_TABLET | Freq: Two times a day (BID) | ORAL | Status: DC

## 2014-10-25 NOTE — Telephone Encounter (Signed)
carvedilol (COREG) 6.25 MG tablet 180 tablet 2 04/27/2014      Sig:  TAKE 1 TABLET TWICE DAILY WITH MEALS    Class:  Normal    DAW:  No    Authorizing Provider:  Larey Dresser, MD    Ordering User:  Bernita Raisin, RN      Visit Pharmacy     [facility] [place], [place]

## 2014-11-02 ENCOUNTER — Other Ambulatory Visit: Payer: Self-pay | Admitting: Cardiology

## 2014-11-15 DIAGNOSIS — H25013 Cortical age-related cataract, bilateral: Secondary | ICD-10-CM | POA: Diagnosis not present

## 2014-11-15 DIAGNOSIS — H2513 Age-related nuclear cataract, bilateral: Secondary | ICD-10-CM | POA: Diagnosis not present

## 2014-11-15 DIAGNOSIS — H40013 Open angle with borderline findings, low risk, bilateral: Secondary | ICD-10-CM | POA: Diagnosis not present

## 2014-11-15 DIAGNOSIS — E119 Type 2 diabetes mellitus without complications: Secondary | ICD-10-CM | POA: Diagnosis not present

## 2014-11-16 ENCOUNTER — Ambulatory Visit (INDEPENDENT_AMBULATORY_CARE_PROVIDER_SITE_OTHER): Payer: Commercial Managed Care - HMO | Admitting: *Deleted

## 2014-11-16 DIAGNOSIS — Z23 Encounter for immunization: Secondary | ICD-10-CM | POA: Diagnosis not present

## 2014-12-05 LAB — HM DIABETES EYE EXAM

## 2014-12-18 ENCOUNTER — Telehealth: Payer: Self-pay | Admitting: Internal Medicine

## 2014-12-18 NOTE — Telephone Encounter (Signed)
Received GI records from [place] and placed on Dr. Vena Rua desk for review.

## 2014-12-31 ENCOUNTER — Other Ambulatory Visit: Payer: Self-pay | Admitting: Cardiology

## 2015-01-07 NOTE — Telephone Encounter (Signed)
L/m a message for the patient to call back and schedule her colonoscopy per Dr. Hilarie Fredrickson.

## 2015-01-09 ENCOUNTER — Encounter: Payer: Self-pay | Admitting: Internal Medicine

## 2015-01-09 NOTE — Telephone Encounter (Signed)
Appointment scheduled for 03/15/15 with Dr. Hilarie Fredrickson

## 2015-02-08 ENCOUNTER — Ambulatory Visit (INDEPENDENT_AMBULATORY_CARE_PROVIDER_SITE_OTHER): Payer: Commercial Managed Care - HMO | Admitting: Cardiology

## 2015-02-08 ENCOUNTER — Encounter: Payer: Self-pay | Admitting: Cardiology

## 2015-02-08 VITALS — BP 160/80 | HR 68 | Ht 63.0 in | Wt 168.4 lb

## 2015-02-08 DIAGNOSIS — E781 Pure hyperglyceridemia: Secondary | ICD-10-CM

## 2015-02-08 DIAGNOSIS — I359 Nonrheumatic aortic valve disorder, unspecified: Secondary | ICD-10-CM

## 2015-02-08 DIAGNOSIS — Z952 Presence of prosthetic heart valve: Secondary | ICD-10-CM

## 2015-02-08 DIAGNOSIS — I10 Essential (primary) hypertension: Secondary | ICD-10-CM

## 2015-02-08 DIAGNOSIS — I2510 Atherosclerotic heart disease of native coronary artery without angina pectoris: Secondary | ICD-10-CM

## 2015-02-08 DIAGNOSIS — R0989 Other specified symptoms and signs involving the circulatory and respiratory systems: Secondary | ICD-10-CM | POA: Diagnosis not present

## 2015-02-08 DIAGNOSIS — Z954 Presence of other heart-valve replacement: Secondary | ICD-10-CM

## 2015-02-08 NOTE — Patient Instructions (Addendum)
Medication Instructions:  Your physician has recommended you make the following change in your medication:  1.  START over the counter fish oil 1000 mg taking 1 daily   Labwork: CMP & FASTING LIPIDS IN 3 MONTHS   Testing/Procedures: IN September 2017:  Your physician has requested that you have an echocardiogram. Echocardiography is a painless test that uses sound waves to create images of your heart. It provides your doctor with information about the size and shape of your heart and how well your heart's chambers and valves are working. This procedure takes approximately one hour. There are no restrictions for this procedure.   Your physician has requested that you have a carotid duplex. This test is an ultrasound of the carotid arteries in your neck. It looks at blood flow through these arteries that supply the brain with blood. Allow one hour for this exam. There are no restrictions or special instructions.    Follow-Up: Your physician wants you to follow-up in: [place] DR. Aundra Dubin You will receive a reminder letter in the mail two months in advance. If you don't receive a letter, please call our office to schedule the follow-up appointment.    Any Other Special Instructions Will Be Listed Below (If Applicable).  Echocardiogram An echocardiogram, or echocardiography, uses sound waves (ultrasound) to produce an image of your heart. The echocardiogram is simple, painless, obtained within a short period of time, and offers valuable information to your health care provider. The images from an echocardiogram can provide information such as:  Evidence of coronary artery disease (CAD).  Heart size.  Heart muscle function.  Heart valve function.  Aneurysm detection.  Evidence of a past heart attack.  Fluid buildup around the heart.  Heart muscle thickening.  Assess heart valve function. LET [facility] CARE PROVIDER KNOW ABOUT:  Any allergies you have.  All medicines  you are taking, including vitamins, herbs, eye drops, creams, and over-the-counter medicines.  Previous problems you or members of your family have had with the use of anesthetics.  Any blood disorders you have.  Previous surgeries you have had.  Medical conditions you have.  Possibility of pregnancy, if this applies. BEFORE THE PROCEDURE  No special preparation is needed. Eat and drink normally.  PROCEDURE   In order to produce an image of your heart, gel will be applied to your chest and a wand-like tool (transducer) will be moved over your chest. The gel will help transmit the sound waves from the transducer. The sound waves will harmlessly bounce off your heart to allow the heart images to be captured in real-time motion. These images will then be recorded.  You may need an IV to receive a medicine that improves the quality of the pictures. AFTER THE PROCEDURE You may return to your normal schedule including diet, activities, and medicines, unless your health care provider tells you otherwise.   This information is not intended to replace advice given to you by your health care provider. Make sure you discuss any questions you have with your health care provider.   Document Released: 01/17/2000 Document Revised: 02/09/2014 Document Reviewed: 09/26/2012 Elsevier Interactive Patient Education [employer].      If you need a refill on your cardiac medications before your next appointment, please call your pharmacy.

## 2015-02-08 NOTE — Progress Notes (Signed)
Cardiology Office Note   Date:  02/08/2015   ID:  [name], [name] [birth year], MRN [id number]  PCP:  Heather Calico, MD  Cardiologist:  Heather Crawford    Chief Complaint  Patient presents with  . Cardiac Valve Problem      History of Present Illness: Heather Crawford is a 70 y.o. female who presents for AVR replacement, CAD and Lt subclavian stenosis   70 yo with history of rheumatic fever and rheumatic valve disease  She had an episode as a child that is very consistent with rheumatic fever and has had a heart murmur since that time. She had been followed by a cardiologist in [facility] in the past for aortic stenosis and mitral regurgitation. Echo in 1/12 showed moderate aortic stenosis and mild to moderate mitral regurgitation. EF was mildly decreased (45-50%) with abnormal septal motion consistent with LBBB. Patient reported dyspnea with moderate exertion such as after walking 100 feet or walking up an incline. She also would get a tightness/pain in her neck when doing the same activities. No chest pain. No neck tightness at rest. We did an adenosine myoview in 2/12 with EF 56% and evidence for ischemia and scar in the mid to apical anterior wall, septal wall, and apex. Left heart cath was done in 3/12 showing significant RCA disease: 40% ostial, 60% mid, 95% distal. Dr. Lia Foyer attempted to intervene on the RCA but was unable to adequately seat the catheter for intervention. Given the large territory supplied by the RCA, CVTS was consulted regarding CABG + AVR given moderate AS. Patient had AVR with tissue valve and RIMA-RCA in 3/12. Post-op echo in 5/12 showed EF 55-60% and a well-seated bioprosthetic valve. Echo in 9/14 again showed normal EF and stable bioprosthetic aortic valve. Echo in 2016 with normal Ef and stable valve.   Her carotid disease is stable but she did have new Lt subclavian stenosis we discussed symptoms- she has none now.  Today without complaints,  no chest pain, no SOB.  Could not afford prescribed fish oil for elevated triglycerides we discussed OTC fish oil. Her diet recently has not been good she will resume a more healthy lifestyle beginning this month. Along with exercise.  BP is elevated, she checks at home and runs 120/70 she will continue to check and if increased call us.  Past Medical History  Diagnosis Date  . History of rheumatic fever   . 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 Aortic stenosis     a.  echo 1/12 w/mildly dilated LV, EF 45-50% w/paradoxical septal motion consistent w/ LBBB, moderate aortic stenosis w/mean gradient 27 mmHg, trivial AI, mild to moderate MR w/calcified mitral valve;   b. s/p AVR with 21 mm Magna Ease valve  . LBBB (left bundle branch block)   . Allergic rhinitis, cause unspecified   . Esophageal reflux   . Diverticulitis of colon (without mention of hemorrhage)   . Hyperlipidemia   . Hx of hysterectomy   . Chronic back pain     H/O BACK SURGERY  . Glucose intolerance (impaired glucose tolerance)     A1C 6.3 (4/12)  . Heart murmur   . Aortic valve replaced 2012  . Cancer ([place])     skin sarcomas removed  . Hx: UTI (urinary tract infection)   . Myocardial infarction ([place]) 2002  . Coronary artery disease     adenosine myoview 2/12 w/EF 56%, ISCHEMIA W/SCAR IN THE MID TO  APICAL ANTERIO WALL, SEPTAL WALL, AND APEX. LHC 3/12 W/EF 55%, 40% OSTIAL RCA, 60% MID RCA, 95% DISTAL RC A, INTERVENTION  ATTEMPTED BUT UNABLE TO ADEQUADATELY SEAT  CATHETER.  Marland Kitchen CAD (coronary artery disease)     a. s/p CABG 4/12: RIMA-RCA  . CAD, multiple vessel     sees Dr Aundra Crawford every 3-4 months  . H/O vertigo     with fluid  in ears  . PONV (postoperative nausea and vomiting)   . Urinary frequency   . Hemorrhoids   . Snoring   . H/O hiatal hernia   . Arthritis     joint pain  . CHF (congestive heart failure) (HCC)     chronic diastolic CHF    Past Surgical History  Procedure Laterality Date  . Appendectomy  1973    . Tonsillectomy      1955  . Lumbar disc surgery      DR. HUSSEY[place]  . Abdominal hysterectomy    . Dilation and curettage of uterus    . Laparoscopic lysis intestinal adhesions    . Endometrial ablation    . Cardiac catheterization  2012  . Coronary artery bypass graft  2012  . Cardiac valve replacement  2012    Aortic  . Tonsillectomy    . Back surgery      Lumbar Lam and discectomy x 2  . Breast surgery  10/2011    Breast biopsy  . Skin surgery      skin sarcomas removed     Current Outpatient Prescriptions  Medication Sig Dispense Refill  . aspirin 325 MG tablet Take 325 mg by mouth daily.    Marland Kitchen azithromycin (ZITHROMAX) 500 MG tablet TAKE 1 500 MG TABLET 1 HR PRIOR TO DENTAL WORK 1 tablet 0  . carvedilol (COREG) 6.25 MG tablet Take 1 tablet (6.25 mg total) by mouth 2 (two) times daily with a meal. 180 tablet 2  . cetirizine (ZYRTEC) 10 MG tablet Take 10 mg by mouth daily.    . fluticasone (FLONASE) 50 MCG/ACT nasal spray Place 1 spray into both nostrils daily. 29.7 g 3  . furosemide (LASIX) 20 MG tablet TAKE 1 TABLET EVERY DAY  FOR  FLUID  RETENTION 90 tablet 2  . loratadine (CLARITIN) 10 MG tablet Take 10 mg by mouth daily.    . meclizine (ANTIVERT) 25 MG tablet Take 1 tablet (25 mg total) by mouth 3 (three) times daily. May take 1/2 tablet if you wish. 270 tablet 0  . potassium chloride (K-DUR) 10 MEQ tablet Take 1 tablet (10 mEq total) by mouth daily. 90 tablet 2  . ranitidine (ZANTAC) 150 MG tablet Take 150 mg by mouth daily as needed. For reflux    . simvastatin (ZOCOR) 40 MG tablet TAKE 1 TABLET AT BEDTIME 90 tablet 0   No current facility-administered medications for this visit.    Allergies:   Tape; Amoxicillin; Crestor; Lipitor; Neomycin; and Sulfamethoxazole-trimethoprim    Social History:  The patient  reports that she quit smoking about 15 years ago. Her smoking use included Cigarettes. She has a 40 pack-year smoking history. She has quit using  smokeless tobacco. Her smokeless tobacco use included Chew. She reports that she drinks alcohol. She reports that she does not use illicit drugs.   Family History:  The patient's family history includes Breast cancer in her maternal aunt, mother, and sister; CAD in her father; Colon cancer in her paternal uncle; Diabetes type II in her mother;  Heart attack in her father; Heart disease in her mother; Hypertension in her mother; Hypothyroidism in her mother.    ROS:  General:no colds or fevers, some weight loss Skin:no rashes or ulcers HEENT:no blurred vision, no congestion CV:see HPI PUL:see HPI GI:no diarrhea constipation or melena, no indigestion GU:no hematuria, no dysuria MS:no joint pain, no claudication Neuro:no syncope, no lightheadedness Endo:+ diabetes, no thyroid disease  Wt Readings from Last 3 Encounters:  02/08/15 168 lb 6.4 oz (76.386 kg)  10/17/14 177 lb (80.287 kg)  10/11/14 173 lb 8 oz (78.699 kg)     PHYSICAL EXAM: VS:  BP 160/80 mmHg  Pulse 68  Ht 5\' 3"  (1.6 m)  Wt 168 lb 6.4 oz (76.386 kg)  BMI 29.84 kg/m2  SpO2 96% , BMI Body mass index is 29.84 kg/(m^2). General:Pleasant affect, NAD Skin:Warm and dry, brisk capillary refill HEENT:normocephalic, sclera clear, mucus membranes moist Neck:supple, no JVD, + bil  bruits  Heart:S1S2 RRR with 2/6 systolic murmur, no gallup, rub or click Lungs:clear without rales, rhonchi, or wheezes [id number], non tender, + BS, do not palpate liver spleen or masses Ext:no lower ext edema, 2+ pedal pulses, 2+ radial pulses Neuro:alert and oriented X 3, MAE, follows commands, + facial symmetry    EKG:  EKG is NOT ordered today.    Recent Labs: 10/17/2014: ALT 16; BUN 19; Creatinine, Ser 0.63; Hemoglobin 13.4; Platelets 232.0; Potassium 4.2; Sodium 139; TSH 4.31    Lipid Panel    Component Value Date/Time   CHOL 161 08/13/2014 1054   TRIG 300* 08/13/2014 1054   HDL 36* 08/13/2014 1054   CHOLHDL 4.5 08/13/2014 1054    VLDL 60* 08/13/2014 1054   LDLCALC 65 08/13/2014 1054   LDLDIRECT 100.9 08/15/2012 0959       Other studies Reviewed: Additional studies/ records that were reviewed today include: echo and carotid dopplers   ASSESSMENT AND PLAN:  1. Aortic valve disorder Patient had moderate AS likely due to rheumatic heart disease. She had AVR with bioprosthetic valve in 3/12 since she was also having CABG. Valve was well-seated on last echo 9/16. With normal EF stable  Recheck in 1 year, follow with Heather Crawford in 6 months 2. CAD, NATIVE VESSEL Status post RIMA-RCA in 3/12. No chest pain. She will continue ASA, statin, and Coreg. no chest pain 3. Diastolic CHF, chronic  NYHA class II symptoms, improved. She does not appear volume overloaded. Continue current Lasix and KCl. .  4. HYPERLIPIDEMIA-MIXED  Myalgias with atorvastatin and Crestor. She is on simvastatin. last tiglyceride was 300 she could not afford prescription fish oil, I have asked her to take OTC we will recheck labs CMP and Lipids in 3 months. 5. Carotid bruit -doppler 10/2014 stable carotid stenosis  6. Lt subclavian stenosis New 50% or greater Lt subclavian stenosis no lt arm pain or crarmping. Treating medically  7. Metabolic syndrome with [id number] at 6.8 did not follow dietary restrictions over holidays but will monitor diet and begin exercise by walking.  Followed by PCP    Current medicines are reviewed with the patient today.  The patient Has no concerns regarding medicines.  The following changes have been made:  See above Labs/ tests ordered today include:see above  Disposition:   FU:  see above  Signed, Heather Serge, NP  02/08/2015 4:40 PM    [facility] [place], [place], [place], [place] Phone: [phone]; Fax: [phone]  [phone]   

## 2015-02-15 DIAGNOSIS — H40013 Open angle with borderline findings, low risk, bilateral: Secondary | ICD-10-CM | POA: Diagnosis not present

## 2015-02-19 ENCOUNTER — Other Ambulatory Visit: Payer: Self-pay | Admitting: Cardiology

## 2015-02-20 DIAGNOSIS — Z85828 Personal history of other malignant neoplasm of skin: Secondary | ICD-10-CM | POA: Diagnosis not present

## 2015-02-20 DIAGNOSIS — Z08 Encounter for follow-up examination after completed treatment for malignant neoplasm: Secondary | ICD-10-CM | POA: Diagnosis not present

## 2015-02-20 DIAGNOSIS — L570 Actinic keratosis: Secondary | ICD-10-CM | POA: Diagnosis not present

## 2015-02-27 ENCOUNTER — Encounter: Payer: Self-pay | Admitting: *Deleted

## 2015-03-06 ENCOUNTER — Other Ambulatory Visit: Payer: Self-pay | Admitting: Cardiology

## 2015-03-15 ENCOUNTER — Encounter: Payer: Self-pay | Admitting: Internal Medicine

## 2015-03-15 ENCOUNTER — Ambulatory Visit (INDEPENDENT_AMBULATORY_CARE_PROVIDER_SITE_OTHER): Payer: Commercial Managed Care - HMO | Admitting: Internal Medicine

## 2015-03-15 VITALS — BP 144/68 | HR 60 | Ht 62.0 in | Wt 168.1 lb

## 2015-03-15 DIAGNOSIS — R1013 Epigastric pain: Secondary | ICD-10-CM

## 2015-03-15 DIAGNOSIS — Z800 Family history of malignant neoplasm of digestive organs: Secondary | ICD-10-CM | POA: Diagnosis not present

## 2015-03-15 DIAGNOSIS — K219 Gastro-esophageal reflux disease without esophagitis: Secondary | ICD-10-CM

## 2015-03-15 DIAGNOSIS — Z1211 Encounter for screening for malignant neoplasm of colon: Secondary | ICD-10-CM

## 2015-03-15 MED ORDER — NA SULFATE-K SULFATE-MG SULF 17.5-3.13-1.6 GM/177ML PO SOLN: ORAL | Status: DC

## 2015-03-15 NOTE — Patient Instructions (Signed)

## 2015-03-15 NOTE — Progress Notes (Signed)
Patient ID: Heather Crawford, female   DOB: Dec 07, 1945, 70 y.o.   MRN: [id number] HPI: Heather Crawford is a 70 year old female with past medical history of rheumatic fever and aortic stenosis status post bioprosthetic valve replacement, colonic diverticulosis, CAD, diastolic heart failure, hypertension hyperlipidemia who is seen in consultation at the request of Dr. Ronnald Ramp to evaluate epigastric abdominal pain and also to consider repeat colorectal cancer screening. She is here alone today. She reports that she has been feeling well. She does have issues with intermittent epigastric abdominal pain. This is not necessarily associated with heartburn and she denies dysphagia or odynophagia. No nausea or vomiting. Appetite has been good. Pain is nonexertional and does not radiate. Not associated with diaphoresis, chest pain or dyspnea. She does report a history of reflux and takes ranitidine 150 twice daily. Bowel movements have been regular though have been chronically loose occurring 3-4 times per day. She denies blood in her stool or melena. No weight loss. She does have a pertinent family history of colorectal cancer on her mother's side. Her maternal aunt had colorectal cancer as did a niece who developed colorectal cancer in her 45s and died at age 60.   Last colonoscopy reviewed today performed by Dr. Chip Boer in [facility] on 07/12/2007. This revealed normal terminal ileum, sigmoid diverticulosis, and internal hemorrhoids. Five-year recall was recommended.  Past Medical History  Diagnosis Date  . History of rheumatic fever   . 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 Aortic stenosis     a.  echo 1/12 w/mildly dilated LV, EF 45-50% w/paradoxical septal motion consistent w/ LBBB, moderate aortic stenosis w/mean gradient 27 mmHg, trivial AI, mild to moderate MR w/calcified mitral valve;   b. s/p AVR with 21 mm Magna Ease valve  . LBBB (left bundle branch block)   . Allergic rhinitis, cause unspecified   .  Esophageal reflux   . Diverticulitis of colon (without mention of hemorrhage)   . Hyperlipidemia   . Hx of hysterectomy   . Chronic back pain     H/O BACK SURGERY  . Glucose intolerance (impaired glucose tolerance)     A1C 6.3 (4/12)  . Heart murmur   . Aortic valve replaced 2012  . Cancer ([place])     skin sarcomas removed  . Hx: UTI (urinary tract infection)   . Myocardial infarction ([place]) 2002  . Coronary artery disease     adenosine myoview 2/12 w/EF 56%, ISCHEMIA W/SCAR IN THE MID TO APICAL ANTERIO WALL, SEPTAL WALL, AND APEX. LHC 3/12 W/EF 55%, 40% OSTIAL RCA, 60% MID RCA, 95% DISTAL RC A, INTERVENTION  ATTEMPTED BUT UNABLE TO ADEQUADATELY SEAT  CATHETER.  Marland Kitchen CAD (coronary artery disease)     a. s/p CABG 4/12: RIMA-RCA  . CAD, multiple vessel     sees Dr Aundra Dubin every 3-4 months  . H/O vertigo     with fluid  in ears  . PONV (postoperative nausea and vomiting)   . Urinary frequency   . Hemorrhoids   . Snoring   . H/O hiatal hernia   . Arthritis     joint pain  . CHF (congestive heart failure) ([facility])     chronic diastolic CHF  . Diverticulosis   . Hemorrhoids   . Pneumonia     Past Surgical History  Procedure Laterality Date  . Appendectomy  1973  . Tonsillectomy      1955  . Lumbar disc surgery  DR. Red Christians- [place]  . Abdominal hysterectomy    . Dilation and curettage of uterus    . Laparoscopic lysis intestinal adhesions    . Endometrial ablation    . Cardiac catheterization  2012  . Coronary artery bypass graft  2012  . Cardiac valve replacement  2012    Aortic  . Tonsillectomy    . Back surgery      Lumbar Lam and discectomy x 2  . Breast biopsy Right 10/2011  . Skin surgery      skin sarcomas removed  . Breast lumpectomy Right     Outpatient Prescriptions Prior to Visit  Medication Sig Dispense Refill  . aspirin 325 MG tablet Take 325 mg by mouth daily.    . carvedilol (COREG) 6.25 MG tablet Take 1 tablet (6.25 mg total) by mouth 2 (two) times  daily with a meal. 180 tablet 2  . cetirizine (ZYRTEC) 10 MG tablet Take 10 mg by mouth daily.    . fluticasone (FLONASE) 50 MCG/ACT nasal spray Place 1 spray into both nostrils daily. 29.7 g 3  . furosemide (LASIX) 20 MG tablet TAKE 1 TABLET EVERY DAY  FOR  FLUID  RETENTION 90 tablet 2  . loratadine (CLARITIN) 10 MG tablet Take 10 mg by mouth daily.    . meclizine (ANTIVERT) 25 MG tablet Take 1 tablet (25 mg total) by mouth 3 (three) times daily. May take 1/2 tablet if you wish. 270 tablet 0  . potassium chloride (K-DUR,KLOR-CON) 10 MEQ tablet TAKE 1 TABLET EVERY DAY 90 tablet 2  . ranitidine (ZANTAC) 150 MG tablet Take 150 mg by mouth daily as needed. For reflux    . simvastatin (ZOCOR) 40 MG tablet TAKE 1 TABLET AT BEDTIME 90 tablet 0  . azithromycin (ZITHROMAX) 500 MG tablet TAKE 1 500 MG TABLET 1 HR PRIOR TO DENTAL WORK (Patient not taking: Reported on 03/15/2015) 1 tablet 0  . potassium chloride (K-DUR) 10 MEQ tablet Take 1 tablet (10 mEq total) by mouth daily. (Patient not taking: Reported on 03/15/2015) 90 tablet 2   No facility-administered medications prior to visit.    Allergies  Allergen Reactions  . Tape Hives    Breakouts from the adhesive in paper tape and patches  . Amoxicillin Itching  . Crestor [Rosuvastatin] Other (See Comments)    myalgia  . Lipitor [Atorvastatin] Other (See Comments)    myalgia  . Neomycin Itching    Topical neomycin   . Sulfamethoxazole-Trimethoprim Hives    Family History  Problem Relation Age of Onset  . Hypertension Mother   . Breast cancer Mother   . Hypothyroidism Mother   . Diabetes type II Mother   . Heart disease Mother   . Heart attack Father     CABG @ 85  . Breast cancer Sister     x 2  . Colon cancer Maternal Aunt   . Breast cancer Maternal Aunt   . Colon cancer Paternal Uncle   . CAD Father   . Pancreatic cancer Other     PGA  . Breast cancer Paternal Grandmother     Social History  Substance Use Topics  . Smoking  status: Former Smoker -- 1.00 packs/day for 40 years    Types: Cigarettes    Quit date: 02/03/2000  . Smokeless tobacco: Former Systems developer    Types: Chew     Comment: discussed LDCT   . Alcohol Use: 0.0 oz/week    0 Standard drinks or equivalent per  week    ROS: As per history of present illness, otherwise negative  BP 144/68 mmHg  Pulse 60  Ht 5\' 2"  (1.575 m)  Wt 168 lb 2 oz (76.261 kg)  BMI 30.74 kg/m2 Constitutional: Well-developed and well-nourished. No distress. HEENT: Normocephalic and atraumatic. Oropharynx is clear and moist. No oropharyngeal exudate. Conjunctivae are normal.  No scleral icterus. Neck: Neck supple. Trachea midline. Cardiovascular: Normal rate, regular rhythm and intact distal pulses. 2/6 SEM Pulmonary/chest: Effort normal and breath sounds normal. No wheezing, rales or rhonchi. Abdominal: Soft, mild epigastric pain to deep palpation without rebound or guarding, nondistended. Bowel sounds active throughout. There are no masses palpable.  Extremities: no clubbing, cyanosis, or edema Lymphadenopathy: No cervical adenopathy noted. Neurological: Alert and oriented to person place and time. Skin: Skin is warm and dry. No rashes noted. Psychiatric: Normal mood and affect. Behavior is normal.  RELEVANT LABS AND IMAGING: CBC    Component Value Date/Time   WBC 7.8 10/17/2014 1405   RBC 4.43 10/17/2014 1405   HGB 13.4 10/17/2014 1405   HCT 39.6 10/17/2014 1405   PLT 232.0 10/17/2014 1405   MCV 89.4 10/17/2014 1405   MCH 30.9 10/29/2011 1004   MCHC 33.7 10/17/2014 1405   RDW 12.6 10/17/2014 1405   LYMPHSABS 2.3 10/17/2014 1405   MONOABS 0.6 10/17/2014 1405   EOSABS 0.1 10/17/2014 1405   BASOSABS 0.0 10/17/2014 1405    CMP     Component Value Date/Time   NA 139 10/17/2014 1405   K 4.2 10/17/2014 1405   CL 102 10/17/2014 1405   CO2 28 10/17/2014 1405   GLUCOSE 99 10/17/2014 1405   BUN 19 10/17/2014 1405   CREATININE 0.63 10/17/2014 1405   CALCIUM 9.6  10/17/2014 1405   PROT 7.2 10/17/2014 1405   ALBUMIN 4.6 10/17/2014 1405   AST 16 10/17/2014 1405   ALT 16 10/17/2014 1405   ALKPHOS 55 10/17/2014 1405   BILITOT 0.4 10/17/2014 1405   GFRNONAA >60 08/13/2014 1054   GFRAA >60 08/13/2014 1054    ASSESSMENT/PLAN: 70 year old female with past medical history of rheumatic fever and aortic stenosis status post bioprosthetic valve replacement, colonic diverticulosis, CAD, diastolic heart failure, hypertension hyperlipidemia who is seen in consultation at the request of Dr. Ronnald Ramp to evaluate epigastric abdominal pain and also to consider repeat colorectal cancer screening.   1. Elevated risk colorectal cancer screening/family history of colon cancer in 2 second-degree relatives -- I recommended repeat screening colonoscopy at this time. Her last exam was in 2009. We discussed the risks, benefits and alternatives she is agreeable to proceed. She is not taking anticoagulation and can continue aspirin during the time of this procedure.  2. Epigastric pain and history of GERD -- GERD seems controlled with ranitidine twice a day but she is having intermittent epigastric abdominal pain. I recommended upper endoscopy for further evaluation. We discussed the risks, benefits and alternatives and she is agreeable to proceed  3.  CAD and CHF -- followed closely by Dr. Aundra Dubin in cardiology.    [id number] L Jones, Md [address] [facility] [address] [place], [place] [address]

## 2015-04-16 ENCOUNTER — Ambulatory Visit (INDEPENDENT_AMBULATORY_CARE_PROVIDER_SITE_OTHER): Payer: Commercial Managed Care - HMO

## 2015-04-16 DIAGNOSIS — Z23 Encounter for immunization: Secondary | ICD-10-CM | POA: Diagnosis not present

## 2015-04-25 ENCOUNTER — Ambulatory Visit (AMBULATORY_SURGERY_CENTER): Payer: Commercial Managed Care - HMO | Admitting: Internal Medicine

## 2015-04-25 ENCOUNTER — Encounter: Payer: Self-pay | Admitting: Internal Medicine

## 2015-04-25 VITALS — BP 113/61 | HR 55 | Temp 96.8°F | Resp 9 | Ht 62.0 in | Wt 168.0 lb

## 2015-04-25 DIAGNOSIS — I509 Heart failure, unspecified: Secondary | ICD-10-CM | POA: Diagnosis not present

## 2015-04-25 DIAGNOSIS — D128 Benign neoplasm of rectum: Secondary | ICD-10-CM

## 2015-04-25 DIAGNOSIS — Z1211 Encounter for screening for malignant neoplasm of colon: Secondary | ICD-10-CM | POA: Diagnosis not present

## 2015-04-25 DIAGNOSIS — K319 Disease of stomach and duodenum, unspecified: Secondary | ICD-10-CM

## 2015-04-25 DIAGNOSIS — R1013 Epigastric pain: Secondary | ICD-10-CM | POA: Diagnosis not present

## 2015-04-25 DIAGNOSIS — Z800 Family history of malignant neoplasm of digestive organs: Secondary | ICD-10-CM | POA: Diagnosis not present

## 2015-04-25 DIAGNOSIS — I10 Essential (primary) hypertension: Secondary | ICD-10-CM | POA: Diagnosis not present

## 2015-04-25 DIAGNOSIS — I2510 Atherosclerotic heart disease of native coronary artery without angina pectoris: Secondary | ICD-10-CM | POA: Diagnosis not present

## 2015-04-25 DIAGNOSIS — Z951 Presence of aortocoronary bypass graft: Secondary | ICD-10-CM | POA: Diagnosis not present

## 2015-04-25 DIAGNOSIS — R42 Dizziness and giddiness: Secondary | ICD-10-CM | POA: Diagnosis not present

## 2015-04-25 DIAGNOSIS — K219 Gastro-esophageal reflux disease without esophagitis: Secondary | ICD-10-CM | POA: Diagnosis not present

## 2015-04-25 DIAGNOSIS — K2950 Unspecified chronic gastritis without bleeding: Secondary | ICD-10-CM | POA: Diagnosis not present

## 2015-04-25 DIAGNOSIS — D129 Benign neoplasm of anus and anal canal: Secondary | ICD-10-CM

## 2015-04-25 DIAGNOSIS — B9681 Helicobacter pylori [H. pylori] as the cause of diseases classified elsewhere: Secondary | ICD-10-CM | POA: Diagnosis not present

## 2015-04-25 MED ORDER — SODIUM CHLORIDE 0.9 % IV SOLN: 500.0000 mL | INTRAVENOUS | Status: DC

## 2015-04-25 NOTE — Op Note (Signed)
[facility] Patient Name: Heather Crawford Procedure Date: 04/25/2015 10:29 AM MRN: [id number] Endoscopist: Jerene Bears , MD Age: 70 Referring MD:  Date of Birth: 15-Apr-1945 Gender: Female Procedure:                Upper GI endoscopy Indications:              Epigastric abdominal pain, Gastro-esophageal reflux                            disease Medicines:                Monitored Anesthesia Care Procedure:                Pre-Anesthesia Assessment:                           - Prior to the procedure, a History and Physical                            was performed, and patient medications and                            allergies were reviewed. The patient's tolerance of                            previous anesthesia was also reviewed. The risks                            and benefits of the procedure and the sedation                            options and risks were discussed with the patient.                            All questions were answered, and informed consent                            was obtained. Prior Anticoagulants: The patient has                            taken no previous anticoagulant or antiplatelet                            agents. ASA Grade Assessment: III - A patient with                            severe systemic disease. After reviewing the risks                            and benefits, the patient was deemed in                            satisfactory condition to undergo the procedure.  After obtaining informed consent, the endoscope was                            passed under direct vision. Throughout the                            procedure, the patient's blood pressure, pulse, and                            oxygen saturations were monitored continuously. The                            Model GIF-HQ190 [id number]) scope was introduced                            through the mouth, and advanced to the second part                   of duodenum. The upper GI endoscopy was                            accomplished without difficulty. The patient                            tolerated the procedure well. Scope In: Scope Out: Findings:      A widely patent Schatzki ring (acquired) was found at the       gastroesophageal junction.      A small hiatal hernia was present.      Localized mildly erythematous mucosa without bleeding was found in the       prepyloric region of the stomach. Biopsies were taken with a cold       forceps for Helicobacter pylori testing (body, antrum and incisura).      The examined duodenum was normal.      The cardia and gastric fundus were normal on retroflexion. Complications:            No immediate complications. Estimated Blood Loss:     Estimated blood loss was minimal. Impression:               - Widely patent Schatzki ring.                           - Small hiatal hernia.                           - Erythematous mucosa in the prepyloric region of                            the stomach. Biopsied.                           - Normal examined duodenum. Recommendation:           - Patient has a contact number available for                            emergencies. The signs and symptoms of potential  delayed complications were discussed with the                            patient. Return to normal activities tomorrow.                            Written discharge instructions were provided to the                            patient.                           - Resume previous diet.                           - Continue present medications.                           - Await pathology results.                           - Perform a colonoscopy today. Procedure Code(s):        --- Professional ---                           [phone], Esophagogastroduodenoscopy, flexible,                            transoral; with biopsy, single or multiple CPT copyright 2016  American Medical Association. All rights reserved. Lajuan Lines. Hilarie Fredrickson, MD Jerene Bears, MD 04/25/2015 11:03:27 AM This report has been signed electronically. Number of Addenda: 0 Referring MD:      Janith Lima, MD

## 2015-04-25 NOTE — Progress Notes (Signed)
Report to PACU, RN, vss, BBS= Clear.  

## 2015-04-25 NOTE — Op Note (Signed)
[facility] Patient Name: Heather Crawford Procedure Date: 04/25/2015 10:28 AM MRN: [id number] Endoscopist: Jerene Bears , MD Age: 70 Referring MD:  Date of Birth: 30-Oct-1945 Gender: Female Procedure:                Colonoscopy Indications:              Colon cancer screening in patient at increased                            risk: Family history of colorectal cancer in                            multiple 2nd degree relatives, Last colonoscopy:                            2009 Medicines:                Monitored Anesthesia Care Procedure:                Pre-Anesthesia Assessment:                           - Prior to the procedure, a History and Physical                            was performed, and patient medications and                            allergies were reviewed. The patient's tolerance of                            previous anesthesia was also reviewed. The risks                            and benefits of the procedure and the sedation                            options and risks were discussed with the patient.                            All questions were answered, and informed consent                            was obtained. Prior Anticoagulants: The patient has                            taken no previous anticoagulant or antiplatelet                            agents. ASA Grade Assessment: III - A patient with                            severe systemic disease. After reviewing the risks  and benefits, the patient was deemed in                            satisfactory condition to undergo the procedure.                           After obtaining informed consent, the colonoscope                            was passed under direct vision. Throughout the                            procedure, the patient's blood pressure, pulse, and                            oxygen saturations were monitored continuously. The   Model PCF-H190L [id number]) scope was introduced                            through the anus and advanced to the the cecum,                            identified by appendiceal orifice and ileocecal                            valve. The colonoscopy was performed without                            difficulty. The patient tolerated the procedure                            well. The quality of the bowel preparation was                            good. The ileocecal valve, appendiceal orifice, and                            rectum were photographed. The bowel preparation                            used was SUPREP (patient stated Suprep was                            difficult to tolerate due to lower abdominal                            cramping. She reported prior MiraLax preparation                            did not cause cramping). Scope In: 10:45:38 AM Scope Out: 10:59:19 AM Scope Withdrawal Time: 0 hours 9 minutes 31 seconds  Total Procedure Duration: 0 hours 13 minutes 41 seconds  Findings:      The perianal exam findings include skin tags.      A 6 mm polyp  was found in the rectum. The polyp was sessile. The polyp       was removed with a hot snare. Resection and retrieval were complete.      Multiple diverticula were found in the ascending colon and left colon.      Internal hemorrhoids were found during retroflexion. The hemorrhoids       were small. Complications:            No immediate complications. Estimated Blood Loss:     Estimated blood loss: none. Impression:               - Perianal skin tags found on perianal exam.                           - One 6 mm polyp in the rectum, removed with a hot                            snare. Resected and retrieved.                           - Mild diverticulosis in the ascending colon and in                            the left colon.                           - Internal hemorrhoids. Recommendation:           - Patient has a contact  number available for                            emergencies. The signs and symptoms of potential                            delayed complications were discussed with the                            patient. Return to normal activities tomorrow.                            Written discharge instructions were provided to the                            patient.                           - Resume previous diet.                           - Continue present medications.                           - Await pathology results.                           - Repeat colonoscopy in 5 years for surveillance. Procedure Code(s):        --- Professional ---  45385, Colonoscopy, flexible; with removal of                            tumor(s), polyp(s), or other lesion(s) by snare                            technique CPT copyright 2016 American Medical Association. All rights reserved. Lajuan Lines. Hilarie Fredrickson, MD Jerene Bears, MD 04/25/2015 11:08:35 AM This report has been signed electronically. Number of Addenda: 0 Referring MD:      Janith Lima, MD

## 2015-04-25 NOTE — Patient Instructions (Signed)
Discharge instructions given. Handouts on polyps,diverticulosis and hemorrhoids. Resume previous medications. YOU HAD AN ENDOSCOPIC PROCEDURE TODAY AT [facility]:   Refer to the procedure report that was given to you for any specific questions about what was found during the examination.  If the procedure report does not answer your questions, please call your gastroenterologist to clarify.  If you requested that your care partner not be given the details of your procedure findings, then the procedure report has been included in a sealed envelope for you to review at your convenience later.  YOU SHOULD EXPECT: Some feelings of bloating in the abdomen. Passage of more gas than usual.  Walking can help get rid of the air that was put into your GI tract during the procedure and reduce the bloating. If you had a lower endoscopy (such as a colonoscopy or flexible sigmoidoscopy) you may notice spotting of blood in your stool or on the toilet paper. If you underwent a bowel prep for your procedure, you may not have a normal bowel movement for a few days.  Please Note:  You might notice some irritation and congestion in your nose or some drainage.  This is from the oxygen used during your procedure.  There is no need for concern and it should clear up in a day or so.  SYMPTOMS TO REPORT IMMEDIATELY:   Following lower endoscopy (colonoscopy or flexible sigmoidoscopy):  Excessive amounts of blood in the stool  Significant tenderness or worsening of abdominal pains  Swelling of the abdomen that is new, acute  Fever of 100F or higher   Following upper endoscopy (EGD)  Vomiting of blood or coffee ground material  New chest pain or pain under the shoulder blades  Painful or persistently difficult swallowing  New shortness of breath  Fever of 100F or higher  Black, tarry-looking stools  For urgent or emergent issues, a gastroenterologist can be reached at any hour by calling [phone]  [phone].   DIET: Your first meal following the procedure should be a small meal and then it is ok to progress to your normal diet. Heavy or fried foods are harder to digest and may make you feel nauseous or bloated.  Likewise, meals heavy in dairy and vegetables can increase bloating.  Drink plenty of fluids but you should avoid alcoholic beverages for 24 hours.  ACTIVITY:  You should plan to take it easy for the rest of today and you should NOT DRIVE or use heavy machinery until tomorrow (because of the sedation medicines used during the test).    FOLLOW UP: Our staff will call the number listed on your records the next business day following your procedure to check on you and address any questions or concerns that you may have regarding the information given to you following your procedure. If we do not reach you, we will leave a message.  However, if you are feeling well and you are not experiencing any problems, there is no need to return our call.  We will assume that you have returned to your regular daily activities without incident.  If any biopsies were taken you will be contacted by phone or by letter within the next 1-3 weeks.  Please call us at [phone] if you have not heard about the biopsies in 3 weeks.    SIGNATURES/CONFIDENTIALITY: You and/or your care partner have signed paperwork which will be entered into your electronic medical record.  These signatures attest to the fact that that  the information above on your After Visit Summary has been reviewed and is understood.  Full responsibility of the confidentiality of this discharge information lies with you and/or your care-partner.

## 2015-04-25 NOTE — Progress Notes (Signed)
Called to room to assist during endoscopic procedure.  Patient ID and intended procedure confirmed with present staff. Received instructions for my participation in the procedure from the performing physician.  

## 2015-04-26 ENCOUNTER — Telehealth: Payer: Self-pay | Admitting: *Deleted

## 2015-04-26 NOTE — Telephone Encounter (Signed)
  Follow up Call-  Call back number 04/25/2015  Post procedure Call Back phone  # [phone] cell number  Permission to leave phone message Yes     Patient questions:  Do you have a fever, pain , or abdominal swelling? No. Pain Score  0 *  Have you tolerated food without any problems? Yes.    Have you been able to return to your normal activities? Yes.    Do you have any questions about your discharge instructions: Diet   No. Medications  No. Follow up visit  No.  Do you have questions or concerns about your Care? No.  Actions: * If pain score is 4 or above: No action needed, pain <4.

## 2015-04-30 ENCOUNTER — Encounter: Payer: Self-pay | Admitting: Internal Medicine

## 2015-04-30 LAB — HM COLONOSCOPY

## 2015-05-01 ENCOUNTER — Other Ambulatory Visit: Payer: Self-pay

## 2015-05-01 ENCOUNTER — Telehealth: Payer: Self-pay | Admitting: Internal Medicine

## 2015-05-01 DIAGNOSIS — A048 Other specified bacterial intestinal infections: Secondary | ICD-10-CM

## 2015-05-01 MED ORDER — OMEPRAZOLE 20 MG PO CPDR: 20.0000 mg | DELAYED_RELEASE_CAPSULE | Freq: Every day | ORAL | Status: DC

## 2015-05-01 MED ORDER — BIS SUBCIT-METRONID-TETRACYC 140-125-125 MG PO CAPS: 3.0000 | ORAL_CAPSULE | Freq: Three times a day (TID) | ORAL | Status: DC

## 2015-05-01 NOTE — Telephone Encounter (Signed)
Drug rep should be coming tomorrow and bring samples. Pt knows I will call her tomorrow regarding samples.

## 2015-05-01 NOTE — Telephone Encounter (Signed)
Pt cannot afford pylera, please advise.

## 2015-05-01 NOTE — Telephone Encounter (Signed)
Please see the drug rep can get samples. She has amoxicillin allergy

## 2015-05-02 ENCOUNTER — Other Ambulatory Visit: Payer: Self-pay | Admitting: Cardiology

## 2015-05-03 NOTE — Telephone Encounter (Signed)
Called pt and let her know the sample of Pylera is here and she can come and pick it up.   Lot# [id number] Exp:  June 2017

## 2015-05-09 ENCOUNTER — Other Ambulatory Visit (INDEPENDENT_AMBULATORY_CARE_PROVIDER_SITE_OTHER): Payer: Commercial Managed Care - HMO | Admitting: *Deleted

## 2015-05-09 DIAGNOSIS — R0989 Other specified symptoms and signs involving the circulatory and respiratory systems: Secondary | ICD-10-CM

## 2015-05-09 DIAGNOSIS — E781 Pure hyperglyceridemia: Secondary | ICD-10-CM | POA: Diagnosis not present

## 2015-05-09 DIAGNOSIS — I359 Nonrheumatic aortic valve disorder, unspecified: Secondary | ICD-10-CM | POA: Diagnosis not present

## 2015-05-09 DIAGNOSIS — I10 Essential (primary) hypertension: Secondary | ICD-10-CM

## 2015-05-09 LAB — COMPREHENSIVE METABOLIC PANEL
ALBUMIN: 4 g/dL (ref 3.6–5.1)
ALK PHOS: 37 U/L (ref 33–130)
ALT: 29 U/L (ref 6–29)
AST: 33 U/L (ref 10–35)
BUN: 17 mg/dL (ref 7–25)
CHLORIDE: 103 mmol/L (ref 98–110)
CO2: 27 mmol/L (ref 20–31)
Calcium: 8.8 mg/dL (ref 8.6–10.4)
Creat: 0.74 mg/dL (ref 0.60–0.93)
Glucose, Bld: 122 mg/dL — ABNORMAL HIGH (ref 65–99)
Potassium: 4.4 mmol/L (ref 3.5–5.3)
Sodium: 140 mmol/L (ref 135–146)
TOTAL PROTEIN: 6.4 g/dL (ref 6.1–8.1)
Total Bilirubin: 0.5 mg/dL (ref 0.2–1.2)

## 2015-05-09 LAB — LIPID PANEL
CHOL/HDL RATIO: 3.5 ratio (ref <=5.0)
CHOLESTEROL: 140 mg/dL (ref 125–200)
HDL: 40 mg/dL — ABNORMAL LOW (ref >=46)
LDL Cholesterol: 60 mg/dL (ref <130)
TRIGLYCERIDES: 198 mg/dL — AB (ref <150)
VLDL: 40 mg/dL — AB (ref <30)

## 2015-05-09 NOTE — Addendum Note (Signed)
Addended by: Eulis Foster on: 05/09/2015 08:25 AM   Modules accepted: Orders

## 2015-07-04 ENCOUNTER — Telehealth: Payer: Self-pay

## 2015-07-04 NOTE — Telephone Encounter (Signed)
-----   Message from Algernon Huxley, RN sent at 05/01/2015 11:14 AM EDT ----- Regarding: H pylori test Needs stool h pylori test in May, orders in epic.

## 2015-07-04 NOTE — Telephone Encounter (Signed)
Pt aware and knows to hold stomach meds for 2 weeks prior to the test.

## 2015-07-10 ENCOUNTER — Other Ambulatory Visit: Payer: Self-pay | Admitting: Cardiology

## 2015-07-11 ENCOUNTER — Telehealth: Payer: Self-pay

## 2015-07-11 NOTE — Telephone Encounter (Signed)
Pt aware and states she can come after 07/18/15, she will be off of PPI's for 2 weeks at that date. Order in epic.

## 2015-07-26 ENCOUNTER — Ambulatory Visit (INDEPENDENT_AMBULATORY_CARE_PROVIDER_SITE_OTHER): Payer: Commercial Managed Care - HMO | Admitting: Cardiology

## 2015-07-26 ENCOUNTER — Encounter: Payer: Self-pay | Admitting: Cardiology

## 2015-07-26 VITALS — BP 132/68 | HR 71 | Ht 62.0 in | Wt 170.0 lb

## 2015-07-26 DIAGNOSIS — I5032 Chronic diastolic (congestive) heart failure: Secondary | ICD-10-CM | POA: Diagnosis not present

## 2015-07-26 DIAGNOSIS — I359 Nonrheumatic aortic valve disorder, unspecified: Secondary | ICD-10-CM | POA: Diagnosis not present

## 2015-07-26 DIAGNOSIS — I2510 Atherosclerotic heart disease of native coronary artery without angina pectoris: Secondary | ICD-10-CM

## 2015-07-26 MED ORDER — POTASSIUM CHLORIDE CRYS ER 10 MEQ PO TBCR: 10.0000 meq | EXTENDED_RELEASE_TABLET | Freq: Every day | ORAL | Status: DC

## 2015-07-26 MED ORDER — CARVEDILOL 6.25 MG PO TABS: 6.2500 mg | ORAL_TABLET | Freq: Two times a day (BID) | ORAL | Status: DC

## 2015-07-26 NOTE — Patient Instructions (Signed)
Medication Instructions:  Your physician recommends that you continue on your current medications as directed. Please refer to the Current Medication list given to you today.   Labwork: None ordered  Testing/Procedures: None ordered  Follow-Up: Your physician wants you to follow-up in: 6 months with Dr. Aundra Dubin. You will receive a reminder letter in the mail two months in advance. If you don't receive a letter, please call our office to schedule the follow-up appointment.  If you need a refill on your cardiac medications before your next appointment, please call your pharmacy.  Thank you for choosing [facility]!!

## 2015-07-28 NOTE — Progress Notes (Signed)
Patient ID: Heather Crawford, female   DOB: 07/30/1945, 70 y.o.   MRN: [id number] PCP: Dr. Ronnald Ramp  70 yo with history of rheumatic fever and rheumatic valve disease presents for followup.  Patient had an episode as a child that is very consistent with rheumatic fever and has had a heart murmur since that time.  She had been followed by a cardiologist in [facility] in the past for aortic stenosis and mitral regurgitation.   Echo in 1/12 showed moderate aortic stenosis and mild to moderate mitral regurgitation.  EF was mildly decreased (45-50%) with abnormal septal motion consistent with LBBB.   Patient reported dyspnea with moderate exertion such as after walking 100 feet or walking up an incline. She also would get a tightness/pain in her neck when doing the same activities.  No chest pain.  No neck tightness at rest.  We did an adenosine myoview in 2/12 with EF 56% and evidence for ischemia and scar in the mid to apical anterior wall, septal wall, and apex.  Left heart cath was done in 3/12 showing significant RCA disease: 40% ostial, 60% mid, 95% distal.  Dr. Lia Foyer attempted to intervene on the RCA but was unable to adequately seat the catheter for intervention.  Given the large territory supplied by the RCA, CVTS was consulted regarding CABG + AVR given moderate AS.  Patient had AVR with tissue valve and RIMA-RCA in 3/12. Post-op echo in 5/12 showed EF 55-60% and a well-seated bioprosthetic valve.  Most recent echo in 9/16 showed EF 55-60%, stable bioprosthetic aortic valve.   Overall, patient is doing well today.  She is limited mainly by low back pain/sciatica.  She is short of breath walking up a hill but no problems on flat ground.  She can walk for 30 minutes without problems.  No chest pain.  Ultrasound has shown >50% left subclavian stenosis, but she denies any arm claudication.   ECG: NSR, LBBB (no change)  Labs (1/12): TGs 596, HDL 40, LDL 171, HCT 39, K 4.6, creatinine  Labs (2/12): K 4.8,  creatinine 0.6 Labs (4/12): K 3.9, creatinine 0.8, HCT 32.8 Labs (5/12): K 3.9, creatinine 0.7, LDL 53, HDL 37 Labs (9/12): K 4, creatinine 0.7 Labs (1/13): LDL 89, HDL 42 Labs (7/13): LDL 104, HDL 42, K 4.6, creatinine 0.6 Labs (7/14): K 4.1, creatinine 0.6, LDL 101, HDL 39 Labs (10/14): K 4.5, creatinine 0.8, BNP 48 Labs (7/15): K 4.3, creatinine 0.6, HCT 37.3, LDL 58, HDl 42, TGs 282 Labs (11/15): K 4.7, creatinine 0.7 Labs (11/17): K 4.4, creatinine 0.74, LDL 60, HDL 40  Allergies (verified):  1)  ! Bactrim 2)  ! Amoxicillin 3)  ! Neomycin 4)  ! * Adhesive Tape  Past Medical History: 1. 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 HEART VALVE DISEASE: Echo (1/12) with mildly dilated LV, EF 45-50% with paradoxical septal motion consistent with LBBB, moderate aortic stenosis with mean gradient 27 mmHg, trivial AI, mild to moderate mitral regurgitation with calcified mitral valve.  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 Echo (5/12): EF 55-60%, basal inferior hypokinesis, moderate diastolic dysfunction, normally functioning bioprosthetic aortic valve with mean gradient 13 mmHg, normal RV.  Echo (9/14) with EF 60-65%, bioprosthetic aortic valve with mean gradient 16 mmHg, no AI, mild-moderate MR.  Echo (9/16) with EF 55-60%, bioprosthetic aortic valve normal.  4. LBBB 5. ALLERGIC RHINITIS  6. GERD 7. Hx of DIVERTICULITIS 8. Chronic low back pain with history of back  surgery 9. Appendectomy 10. Hysterectomy 11. Hyperlipidemia: Myalgias with atorvastatin and Crestor.  12. CAD: Adenosine myoview (2/12) with EF 56%, ischemia with scar in the mid to apical anterior wall, septal wall, and apex.  LHC (3/12) with EF 55%, 40% ostial RCA, 60% mid RCA, 95% distal RCA.  Intervention attempted but unable to adequately seat catheter.  Patient had CABG-AVR in 3/12 with RIMA-RCA.    13. Carotid dopplers (3/12): no significant stenosis.  Carotid dopplers (9/14) with 40-59%  bilateral ICA stenosis. Carotid dopplers (9/15) with 40-59% bilateral ICA stenosis.  Carotid dopplers (9/16) with 1-39% BICA stenosis, >50% left subclavian stenosis.  14. Vertigo: labyrinthitis       15. Plantar fasciitis  Social History: [employer], [employer], certified as special ed teacher married  -'3  1 son - '70; 1 dtr '69; 2 grandchildren work - Sales promotion account executive, retired. Now full time care giver Lives in [place], grew up in [place]  Family History: Father - deceased @ 108: CAD/MI, CABG at 52 Mother - deceased @ 89: Uterine cancer, DM, HTN, hypothyroid, breast cancer Sister - breast cancer Aunt - colon cancer  Review of Systems        All systems reviewed and negative except as per HPI.   Current Outpatient Prescriptions  Medication Sig Dispense Refill  . aspirin 325 MG tablet Take 325 mg by mouth daily.    Marland Kitchen bismuth-metronidazole-tetracycline (PYLERA) 140-125-125 MG capsule Take 3 capsules by mouth 4 (four) times daily -  before meals and at bedtime. 120 capsule 0  . carvedilol (COREG) 6.25 MG tablet Take 1 tablet (6.25 mg total) by mouth 2 (two) times daily with a meal. 180 tablet 2  . cetirizine (ZYRTEC) 10 MG tablet Take 10 mg by mouth daily.    . fluticasone (FLONASE) 50 MCG/ACT nasal spray Place 1 spray into both nostrils daily. 29.7 g 3  . furosemide (LASIX) 20 MG tablet TAKE 1 TABLET EVERY DAY  FOR  FLUID  RETENTION 90 tablet 2  . loratadine (CLARITIN) 10 MG tablet Take 10 mg by mouth daily. Reported on 04/25/2015    . meclizine (ANTIVERT) 25 MG tablet Take 1 tablet (25 mg total) by mouth 3 (three) times daily. May take 1/2 tablet if you wish. 270 tablet 0  . Omega-3 Fatty Acids (FISH OIL) 1000 MG CAPS Take 2 capsules by mouth 2 (two) times daily.    Marland Kitchen omeprazole (PRILOSEC) 20 MG capsule Take 1 capsule (20 mg total) by mouth daily. 20 capsule 0  . potassium chloride (K-DUR,KLOR-CON) 10 MEQ tablet Take 1 tablet (10 mEq total) by mouth daily. 90 tablet 2  . ranitidine  (ZANTAC) 150 MG tablet Take 150 mg by mouth daily as needed. For reflux    . simvastatin (ZOCOR) 40 MG tablet TAKE 1 TABLET AT BEDTIME 90 tablet 0  . azithromycin (ZITHROMAX) 500 MG tablet TAKE 1 500 MG TABLET 1 HR PRIOR TO DENTAL WORK (Patient not taking: Reported on 07/26/2015) 1 tablet 0   No current facility-administered medications for this visit.    BP 132/68 mmHg  Pulse 71  Ht 5\' 2"  (1.575 m)  Wt 170 lb (77.111 kg)  BMI 31.09 kg/m2 General: NAD, overweight Neck: No JVD, no thyromegaly or thyroid nodule. Bilateral soft carotid bruits.  Lungs: CTAB  CV: Nondisplaced PMI.  Heart regular S1/S2, no S3/S4, 2/6 early SEM RUSB.  Trace ankle edema. Soft bilateral carotid bruits.  Normal pedal pulses. 2+ radial pulses bilaterally.  Abdomen: Soft, nontender, no hepatosplenomegaly, no  distention.  Neurologic: Alert and oriented x 3.  Psych: Normal affect. Extremities: No clubbing or cyanosis.   Assessment/Plan  1. Aortic valve disorder Patient had moderate AS likely due to rheumatic heart disease. She had AVR with bioprosthetic valve in 3/12 since she was also having CABG. Valve was well-seated on last echo in 9/16.  2. CAD Status post RIMA-RCA in 3/12. No chest pain. She will continue ASA, statin, and Coreg.   3. Diastolic CHF, chronic  NYHA class II symptoms, stable.  She does not appear volume overloaded.  Continue current Lasix and KCl.  4. HYPERLIPIDEMIA Myalgias with atorvastatin and Crestor.  She is on simvastatin.  Good lipids in 4/17.  5. Carotid bruit No significant carotid stenosis in 9/16 but she had > 50% left subclavian stenosis.  She has normal radial pulses on exam and no arm claudication, so no treatment beyond statin/aspirin at this point.   Followup in 6 months.   Loralie Champagne 07/28/2015

## 2015-07-30 ENCOUNTER — Other Ambulatory Visit: Payer: Commercial Managed Care - HMO

## 2015-07-30 DIAGNOSIS — B9681 Helicobacter pylori [H. pylori] as the cause of diseases classified elsewhere: Secondary | ICD-10-CM | POA: Diagnosis not present

## 2015-07-30 DIAGNOSIS — A048 Other specified bacterial intestinal infections: Secondary | ICD-10-CM

## 2015-08-01 LAB — H. PYLORI ANTIGEN, STOOL: H PYLORI AG STL: NEGATIVE

## 2015-08-28 DIAGNOSIS — H04123 Dry eye syndrome of bilateral lacrimal glands: Secondary | ICD-10-CM | POA: Diagnosis not present

## 2015-08-28 DIAGNOSIS — H40013 Open angle with borderline findings, low risk, bilateral: Secondary | ICD-10-CM | POA: Diagnosis not present

## 2015-09-17 ENCOUNTER — Other Ambulatory Visit: Payer: Self-pay | Admitting: Cardiology

## 2015-10-10 ENCOUNTER — Ambulatory Visit (HOSPITAL_COMMUNITY)
Admission: RE | Admit: 2015-10-10 | Discharge: 2015-10-10 | Disposition: A | Discharge status: Home / Self Care | Payer: Commercial Managed Care - HMO | Source: Ambulatory Visit | Attending: Cardiovascular Disease | Admitting: Cardiovascular Disease

## 2015-10-10 ENCOUNTER — Other Ambulatory Visit: Payer: Self-pay

## 2015-10-10 ENCOUNTER — Ambulatory Visit (HOSPITAL_BASED_OUTPATIENT_CLINIC_OR_DEPARTMENT_OTHER): Payer: Commercial Managed Care - HMO

## 2015-10-10 DIAGNOSIS — I359 Nonrheumatic aortic valve disorder, unspecified: Secondary | ICD-10-CM

## 2015-10-10 DIAGNOSIS — E119 Type 2 diabetes mellitus without complications: Secondary | ICD-10-CM | POA: Insufficient documentation

## 2015-10-10 DIAGNOSIS — R0989 Other specified symptoms and signs involving the circulatory and respiratory systems: Secondary | ICD-10-CM | POA: Insufficient documentation

## 2015-10-10 DIAGNOSIS — Z87891 Personal history of nicotine dependence: Secondary | ICD-10-CM | POA: Insufficient documentation

## 2015-10-10 DIAGNOSIS — Z953 Presence of xenogenic heart valve: Secondary | ICD-10-CM | POA: Insufficient documentation

## 2015-10-10 DIAGNOSIS — I10 Essential (primary) hypertension: Secondary | ICD-10-CM | POA: Insufficient documentation

## 2015-10-10 DIAGNOSIS — E781 Pure hyperglyceridemia: Secondary | ICD-10-CM | POA: Insufficient documentation

## 2015-10-10 DIAGNOSIS — I6523 Occlusion and stenosis of bilateral carotid arteries: Secondary | ICD-10-CM | POA: Diagnosis not present

## 2015-10-10 DIAGNOSIS — I2510 Atherosclerotic heart disease of native coronary artery without angina pectoris: Secondary | ICD-10-CM | POA: Insufficient documentation

## 2015-10-10 DIAGNOSIS — I340 Nonrheumatic mitral (valve) insufficiency: Secondary | ICD-10-CM | POA: Diagnosis not present

## 2015-10-16 ENCOUNTER — Other Ambulatory Visit: Payer: Self-pay | Admitting: Cardiology

## 2015-10-16 ENCOUNTER — Telehealth: Payer: Self-pay | Admitting: Internal Medicine

## 2015-10-16 NOTE — Telephone Encounter (Signed)
I think her routine mammogram was normal last year so we can go ahead and do another routine screening exam  this year

## 2015-10-16 NOTE — Telephone Encounter (Signed)
Pt called Heather Crawford to schedule her mammogram and they are needing to know if you want her to have a diagnostic one done due to her history. Can you please put an order in for her for [facility]?

## 2015-10-17 NOTE — Telephone Encounter (Signed)
Pt aware.

## 2015-10-22 ENCOUNTER — Ambulatory Visit (INDEPENDENT_AMBULATORY_CARE_PROVIDER_SITE_OTHER): Payer: Commercial Managed Care - HMO

## 2015-10-22 VITALS — BP 118/60 | HR 97 | Ht 63.0 in | Wt 172.0 lb

## 2015-10-22 DIAGNOSIS — J309 Allergic rhinitis, unspecified: Secondary | ICD-10-CM

## 2015-10-22 DIAGNOSIS — Z0000 Encounter for general adult medical examination without abnormal findings: Secondary | ICD-10-CM

## 2015-10-22 MED ORDER — FLUTICASONE PROPIONATE 50 MCG/ACT NA SUSP: 1.0000 | Freq: Every day | NASAL | 3 refills | Status: DC

## 2015-10-22 NOTE — Patient Instructions (Addendum)
Heather Crawford , Thank you for taking time to come for your Medicare Wellness Visit. I appreciate your ongoing commitment to your health goals. Please review the following plan we discussed and let me know if I can assist you in the future.   Will have Dr. Herbert Deaner send in eye exam in October  These are the goals we discussed: Goals    . Exercise 150 minutes per week (moderate activity)          Will start walking x 30 minutes 3 days a week  Will build up to this time and then move on.  Keep journal    . Weight (lb) < 160 lb (72.6 kg)          Will try again to lose some weight;  Will drink more water       This is a list of the screening recommended for you and due dates:  Health Maintenance  Topic Date Due  . Hemoglobin A1C  04/16/2015  . Complete foot exam   10/17/2015  . Urine Protein Check  10/17/2015  . Eye exam for diabetics  12/03/2015*  . Flu Shot  09/02/2016*  . Mammogram  10/23/2016  . Tetanus Vaccine  02/22/2020  . Colon Cancer Screening  04/29/2020  . DEXA scan (bone density measurement)  Completed  . Shingles Vaccine  Addressed  .  Hepatitis C: One time screening is recommended by Center for Disease Control  (CDC) for  adults born from [birth year] through 1967.   Completed  . Pneumonia vaccines  Completed  *Topic was postponed. The date shown is not the original due date.       Fall Prevention in the Home  Falls can cause injuries. They can happen to people of all ages. There are many things you can do to make your home safe and to help prevent falls.  WHAT CAN I DO ON THE OUTSIDE OF MY HOME?  Regularly fix the edges of walkways and driveways and fix any cracks.  Remove anything that might make you trip as you walk through a door, such as a raised step or threshold.  Trim any bushes or trees on the path to your home.  Use bright outdoor lighting.  Clear any walking paths of anything that might make someone trip, such as rocks or tools.  Regularly check to see  if handrails are loose or broken. Make sure that both sides of any steps have handrails.  Any raised decks and porches should have guardrails on the edges.  Have any leaves, snow, or ice cleared regularly.  Use sand or salt on walking paths during winter.  Clean up any spills in your garage right away. This includes oil or grease spills. WHAT CAN I DO IN THE BATHROOM?   Use night lights.  Install grab bars by the toilet and in the tub and shower. Do not use towel bars as grab bars.  Use non-skid mats or decals in the tub or shower.  If you need to sit down in the shower, use a plastic, non-slip stool.  Keep the floor dry. Clean up any water that spills on the floor as soon as it happens.  Remove soap buildup in the tub or shower regularly.  Attach bath mats securely with double-sided non-slip rug tape.  Do not have throw rugs and other things on the floor that can make you trip. WHAT CAN I DO IN THE BEDROOM?  Use night lights.  Make sure that you  have a light by your bed that is easy to reach.  Do not use any sheets or blankets that are too big for your bed. They should not hang down onto the floor.  Have a firm chair that has side arms. You can use this for support while you get dressed.  Do not have throw rugs and other things on the floor that can make you trip. WHAT CAN I DO IN THE KITCHEN?  Clean up any spills right away.  Avoid walking on wet floors.  Keep items that you use a lot in easy-to-reach places.  If you need to reach something above you, use a strong step stool that has a grab bar.  Keep electrical cords out of the way.  Do not use floor polish or wax that makes floors slippery. If you must use wax, use non-skid floor wax.  Do not have throw rugs and other things on the floor that can make you trip. WHAT CAN I DO WITH MY STAIRS?  Do not leave any items on the stairs.  Make sure that there are handrails on both sides of the stairs and use them.  Fix handrails that are broken or loose. Make sure that handrails are as long as the stairways.  Check any carpeting to make sure that it is firmly attached to the stairs. Fix any carpet that is loose or worn.  Avoid having throw rugs at the top or bottom of the stairs. If you do have throw rugs, attach them to the floor with carpet tape.  Make sure that you have a light switch at the top of the stairs and the bottom of the stairs. If you do not have them, ask someone to add them for you. WHAT ELSE CAN I DO TO HELP PREVENT FALLS?  Wear shoes that:  Do not have high heels.  Have rubber bottoms.  Are comfortable and fit you well.  Are closed at the toe. Do not wear sandals.  If you use a stepladder:  Make sure that it is fully opened. Do not climb a closed stepladder.  Make sure that both sides of the stepladder are locked into place.  Ask someone to hold it for you, if possible.  Clearly mark and make sure that you can see:  Any grab bars or handrails.  First and last steps.  Where the edge of each step is.  Use tools that help you move around (mobility aids) if they are needed. These include:  Canes.  Walkers.  Scooters.  Crutches.  Turn on the lights when you go into a dark area. Replace any light bulbs as soon as they burn out.  Set up your furniture so you have a clear path. Avoid moving your furniture around.  If any of your floors are uneven, fix them.  If there are any pets around you, be aware of where they are.  Review your medicines with your doctor. Some medicines can make you feel dizzy. This can increase your chance of falling. Ask your doctor what other things that you can do to help prevent falls.   This information is not intended to replace advice given to you by your health care provider. Make sure you discuss any questions you have with your health care provider.   Document Released: 11/15/2008 Document Revised: 06/05/2014 Document Reviewed:  02/23/2014 Elsevier Interactive Patient Education 2016 [place] Maintenance, Female Adopting a healthy lifestyle and getting preventive care can go a long way  to promote health and wellness. Talk with your health care provider about what schedule of regular examinations is right for you. This is a good chance for you to check in with your provider about disease prevention and staying healthy. In between checkups, there are plenty of things you can do on your own. Experts have done a lot of research about which lifestyle changes and preventive measures are most likely to keep you healthy. Ask your health care provider for more information. WEIGHT AND DIET  Eat a healthy diet  Be sure to include plenty of vegetables, fruits, low-fat dairy products, and lean protein.  Do not eat a lot of foods high in solid fats, added sugars, or salt.  Get regular exercise. This is one of the most important things you can do for your health.  Most adults should exercise for at least 150 minutes each week. The exercise should increase your heart rate and make you sweat (moderate-intensity exercise).  Most adults should also do strengthening exercises at least twice a week. This is in addition to the moderate-intensity exercise.  Maintain a healthy weight  Body mass index (BMI) is a measurement that can be used to identify possible weight problems. It estimates body fat based on height and weight. Your health care provider can help determine your BMI and help you achieve or maintain a healthy weight.  For females 66 years of age and older:   A BMI below 18.5 is considered underweight.  A BMI of 18.5 to 24.9 is normal.  A BMI of 25 to 29.9 is considered overweight.  A BMI of 30 and above is considered obese.  Watch levels of cholesterol and blood lipids  You should start having your blood tested for lipids and cholesterol at 72 years of age, then have this test every 5 years.  You may  need to have your cholesterol levels checked more often if:  Your lipid or cholesterol levels are high.  You are older than 71 years of age.  You are at high risk for heart disease.  CANCER SCREENING   Lung Cancer  Lung cancer screening is recommended for adults 34-48 years old who are at high risk for lung cancer because of a history of smoking.  A yearly low-dose CT scan of the lungs is recommended for people who:  Currently smoke.  Have quit within the past 15 years.  Have at least a 30-pack-year history of smoking. A pack year is smoking an average of one pack of cigarettes a day for 1 year.  Yearly screening should continue until it has been 15 years since you quit.  Yearly screening should stop if you develop a health problem that would prevent you from having lung cancer treatment.  Breast Cancer  Practice breast self-awareness. This means understanding how your breasts normally appear and feel.  It also means doing regular breast self-exams. Let your health care provider know about any changes, no matter how small.  If you are in your 20s or 30s, you should have a clinical breast exam (CBE) by a health care provider every 1-3 years as part of a regular health exam.  If you are 51 or older, have a CBE every year. Also consider having a breast X-ray (mammogram) every year.  If you have a family history of breast cancer, talk to your health care provider about genetic screening.  If you are at high risk for breast cancer, talk to your health care provider about having an  MRI and a mammogram every year.  Breast cancer gene (BRCA) assessment is recommended for women who have family members with BRCA-related cancers. BRCA-related cancers include:  Breast.  Ovarian.  Tubal.  Peritoneal cancers.  Results of the assessment will determine the need for genetic counseling and BRCA1 and BRCA2 testing. Cervical Cancer Your health care provider may recommend that you be  screened regularly for cancer of the pelvic organs (ovaries, uterus, and vagina). This screening involves a pelvic examination, including checking for microscopic changes to the surface of your cervix (Pap test). You may be encouraged to have this screening done every 3 years, beginning at age 66.  For women ages 65-65, health care providers may recommend pelvic exams and Pap testing every 3 years, or they may recommend the Pap and pelvic exam, combined with testing for human papilloma virus (HPV), every 5 years. Some types of HPV increase your risk of cervical cancer. Testing for HPV may also be done on women of any age with unclear Pap test results.  Other health care providers may not recommend any screening for nonpregnant women who are considered low risk for pelvic cancer and who do not have symptoms. Ask your health care provider if a screening pelvic exam is right for you.  If you have had past treatment for cervical cancer or a condition that could lead to cancer, you need Pap tests and screening for cancer for at least 20 years after your treatment. If Pap tests have been discontinued, your risk factors (such as having a new sexual partner) need to be reassessed to determine if screening should resume. Some women have medical problems that increase the chance of getting cervical cancer. In these cases, your health care provider may recommend more frequent screening and Pap tests. Colorectal Cancer  This type of cancer can be detected and often prevented.  Routine colorectal cancer screening usually begins at 72 years of age and continues through 72 years of age.  Your health care provider may recommend screening at an earlier age if you have risk factors for colon cancer.  Your health care provider may also recommend using home test kits to check for hidden blood in the stool.  A small camera at the end of a tube can be used to examine your colon directly (sigmoidoscopy or colonoscopy).  This is done to check for the earliest forms of colorectal cancer.  Routine screening usually begins at age 16.  Direct examination of the colon should be repeated every 5-10 years through 72 years of age. However, you may need to be screened more often if early forms of precancerous polyps or small growths are found. Skin Cancer  Check your skin from head to toe regularly.  Tell your health care provider about any new moles or changes in moles, especially if there is a change in a mole's shape or color.  Also tell your health care provider if you have a mole that is larger than the size of a pencil eraser.  Always use sunscreen. Apply sunscreen liberally and repeatedly throughout the day.  Protect yourself by wearing long sleeves, pants, a wide-brimmed hat, and sunglasses whenever you are outside. HEART DISEASE, DIABETES, AND HIGH BLOOD PRESSURE   High blood pressure causes heart disease and increases the risk of stroke. High blood pressure is more likely to develop in:  People who have blood pressure in the high end of the normal range (130-139/85-89 mm Hg).  People who are overweight or obese.  People  who are African American.  If you are 26-10 years of age, have your blood pressure checked every 3-5 years. If you are 15 years of age or older, have your blood pressure checked every year. You should have your blood pressure measured twice--once when you are at a hospital or clinic, and once when you are not at a hospital or clinic. Record the average of the two measurements. To check your blood pressure when you are not at a hospital or clinic, you can use:  An automated blood pressure machine at a pharmacy.  A home blood pressure monitor.  If you are between 70 years and 45 years old, ask your health care provider if you should take aspirin to prevent strokes.  Have regular diabetes screenings. This involves taking a blood sample to check your fasting blood sugar level.  If you  are at a normal weight and have a low risk for diabetes, have this test once every three years after 71 years of age.  If you are overweight and have a high risk for diabetes, consider being tested at a younger age or more often. PREVENTING INFECTION  Hepatitis B  If you have a higher risk for hepatitis B, you should be screened for this virus. You are considered at high risk for hepatitis B if:  You were born in a country where hepatitis B is common. Ask your health care provider which countries are considered high risk.  Your parents were born in a high-risk country, and you have not been immunized against hepatitis B (hepatitis B vaccine).  You have HIV or AIDS.  You use needles to inject street drugs.  You live with someone who has hepatitis B.  You have had sex with someone who has hepatitis B.  You get hemodialysis treatment.  You take certain medicines for conditions, including cancer, organ transplantation, and autoimmune conditions. Hepatitis C  Blood testing is recommended for:  Everyone born from [birth year] through 1967.  Anyone with known risk factors for hepatitis C. Sexually transmitted infections (STIs)  You should be screened for sexually transmitted infections (STIs) including gonorrhea and chlamydia if:  You are sexually active and are younger than 72 years of age.  You are older than 72 years of age and your health care provider tells you that you are at risk for this type of infection.  Your sexual activity has changed since you were last screened and you are at an increased risk for chlamydia or gonorrhea. Ask your health care provider if you are at risk.  If you do not have HIV, but are at risk, it may be recommended that you take a prescription medicine daily to prevent HIV infection. This is called pre-exposure prophylaxis (PrEP). You are considered at risk if:  You are sexually active and do not regularly use condoms or know the HIV status of your  partner(s).  You take drugs by injection.  You are sexually active with a partner who has HIV. Talk with your health care provider about whether you are at high risk of being infected with HIV. If you choose to begin PrEP, you should first be tested for HIV. You should then be tested every 3 months for as long as you are taking PrEP.  PREGNANCY   If you are premenopausal and you may become pregnant, ask your health care provider about preconception counseling.  If you may become pregnant, take 400 to 800 micrograms (mcg) of folic acid every day.  If you  want to prevent pregnancy, talk to your health care provider about birth control (contraception). OSTEOPOROSIS AND MENOPAUSE   Osteoporosis is a disease in which the bones lose minerals and strength with aging. This can result in serious bone fractures. Your risk for osteoporosis can be identified using a bone density scan.  If you are 68 years of age or older, or if you are at risk for osteoporosis and fractures, ask your health care provider if you should be screened.  Ask your health care provider whether you should take a calcium or vitamin D supplement to lower your risk for osteoporosis.  Menopause may have certain physical symptoms and risks.  Hormone replacement therapy may reduce some of these symptoms and risks. Talk to your health care provider about whether hormone replacement therapy is right for you.  HOME CARE INSTRUCTIONS   Schedule regular health, dental, and eye exams.  Stay current with your immunizations.   Do not use any tobacco products including cigarettes, chewing tobacco, or electronic cigarettes.  If you are pregnant, do not drink alcohol.  If you are breastfeeding, limit how much and how often you drink alcohol.  Limit alcohol intake to no more than 1 drink per day for nonpregnant women. One drink equals 12 ounces of beer, 5 ounces of wine, or 1 ounces of hard liquor.  Do not use street drugs.  Do  not share needles.  Ask your health care provider for help if you need support or information about quitting drugs.  Tell your health care provider if you often feel depressed.  Tell your health care provider if you have ever been abused or do not feel safe at home.   This information is not intended to replace advice given to you by your health care provider. Make sure you discuss any questions you have with your health care provider.   Document Released: 08/04/2010 Document Revised: 02/09/2014 Document Reviewed: 12/21/2012 Elsevier Interactive Patient Education [employer].

## 2015-10-22 NOTE — Progress Notes (Signed)
Subjective:   Heather Crawford is a 70 y.o. female who presents for Medicare Annual (Subsequent) preventive examination.  HRA assessment completed during this visit with [name] The Patient was informed that the wellness visit is to identify future health risk and educate and initiate measures that can reduce risk for increased disease through the lifespan.    Future plans to go to [place]; Spring of 2018   BMI: 30.6  States she lost weight;  Diet; Meat; 2 to 3 vegetables; She watches carbs; One carb to 2 carbs a meal     Exercise; not sedentary; now doing gardening, canning and freezing;  Green beans, butter beans, tomatoes; Canned 140 quarts of greens beans and vegetables; Has chickens as well; fresh eggs Both get out and manage the garden No planting winter garden    SAFETY one level / Dtr wants them to move to [place];  They are not ready at this time Safety reviewed for the home; including removal of clutter; clear paths through the home, eliminating clutter, railing as needed; bathroom safety reviewed and have modified; community safety; smoke detectors and firearms safety education given as appropriate Driving accidents; no and wears seatbelt Sun protection/ hx of squamous cell cancer/ Recommended  hat and sun screen;    Medication review/ New meds/ no   Fall assessment / no   Gait assessment/ no   Mobilization and Functional losses in the last year. He does not want her to climb;   Sleeps well; not up hs;      Urinary or fecal incontinence reviewed/ up at hs to the bathroom  Diarrhea; one polyp; GI offered med but declined currently    Counseling overdue screens: Ophthalmology exam; 08/2013/ just had one eye exam in July 2017 to rule out glaucoma.  Neg for glaucoma; Dr. Pandora Crawford; due regular eye exam in October.    A1c; deferred to CPE in October with Dr. Ronnald Crawford Flu vaccine / declines due to allergies  Visit other countries and plans to go back next  year.    A1c;  12/2013- 7.1/ Repeat per Dr. Ronnald Crawford at [name];     Colonoscopy; 04/2015/ next one is due 10/2020 Goes to Dr. Hilarie Crawford    EKG 08/13/2014 Dexa scan; 08/2011 /12/28/2014  Mammogram 10/2014  Hearing: 2000hz  both ears   Immunizations Due: flu  Flu: States she does not take due to allergies/ antibiotics and tape      Cognitive screen completed; MMSE documented or assessed for failures or issues with the AD8 screen below:   Ad8 score reviewed for issues;  Issues making decisions; no  Less interest in hobbies / activities" no  Repeats questions, stories; family complaining: NO  Trouble using ordinary gadgets; microwave; computer: no  Forgets the month or year: no  Mismanaging finances: no    Missing apt: no but does write them down  Daily problems with thinking of memory NO Ad8 score is 0  MMSE not appropriate unless AD8 score is > 2   Advanced Directive reviewed for complete; completed   Future due dates reviewed with the patient for accuracy.    Established and updated Risk reviewed and appropriate referral made or health recommendations as appropriate based on individual needs and choices;   Current Care Team reviewed and updated Cardiac Risk Factors include: advanced age (>19men, >56 women);diabetes mellitus;dyslipidemia;family history of premature cardiovascular disease;hypertension;obesity (BMI >30kg/m2)     Objective:     Vitals: BP 118/60   Pulse 97   Ht  5\' 3"  (1.6 m)   Wt 172 lb (78 kg)   SpO2 (!) 59%   BMI 30.47 kg/m   Body mass index is 30.47 kg/m.   Tobacco History  Smoking Status  . Former Smoker  . Packs/day: 1.00  . Years: 40.00  . Types: Cigarettes  . Quit date: 02/03/2000  Smokeless Tobacco  . Former Systems developer  . Types: Chew    Comment: discussed LDCT      Counseling given: Not Answered   Past Medical History:  Diagnosis Date  . Allergic rhinitis, cause unspecified   . Aortic stenosis    a.  echo 1/12 w/mildly dilated LV,  EF 45-50% w/paradoxical septal motion consistent w/ LBBB, moderate aortic stenosis w/mean gradient 27 mmHg, trivial AI, mild to moderate MR w/calcified mitral valve;   b. s/p AVR with 21 mm Magna Ease valve  . Aortic valve replaced 2012  . Arthritis    joint pain  . CAD (coronary artery disease)    a. s/p CABG 4/12: RIMA-RCA  . CAD, multiple vessel    sees Dr Heather Crawford every 3-4 months  . Cancer ([place])    skin sarcomas removed  . CHF (congestive heart failure) (HCC)    chronic diastolic CHF  . Chronic back pain    H/O BACK SURGERY  . Coronary artery disease    adenosine myoview 2/12 w/EF 56%, ISCHEMIA W/SCAR IN THE MID TO APICAL ANTERIO WALL, SEPTAL WALL, AND APEX. LHC 3/12 W/EF 55%, 40% OSTIAL RCA, 60% MID RCA, 95% DISTAL RC A, INTERVENTION  ATTEMPTED BUT UNABLE TO ADEQUADATELY SEAT  CATHETER.  . Diverticulitis of colon (without mention of hemorrhage)   . Diverticulosis   . Esophageal reflux   . Glucose intolerance (impaired glucose tolerance)    A1C 6.3 (4/12)  . H/O hiatal hernia   . H/O vertigo    with fluid  in ears  . Heart murmur   . Hemorrhoids   . Hemorrhoids   . History of rheumatic fever   . Hx of hysterectomy   . Hx: UTI (urinary tract infection)   . Hyperlipidemia   . LBBB (left bundle branch block)   . Myocardial infarction ([facility]) 2002  . Pneumonia   . PONV (postoperative nausea and vomiting)   . 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 Snoring   . Urinary frequency    Past Surgical History:  Procedure Laterality Date  . ABDOMINAL HYSTERECTOMY    . APPENDECTOMY  1973  . BACK SURGERY     Lumbar Lam and discectomy x 2  . BREAST BIOPSY Right 10/2011  . BREAST LUMPECTOMY Right   . CARDIAC CATHETERIZATION  2012  . CARDIAC VALVE REPLACEMENT  2012   Aortic  . CORONARY ARTERY BYPASS GRAFT  2012  . DILATION AND CURETTAGE OF UTERUS    . ENDOMETRIAL ABLATION    . LAPAROSCOPIC LYSIS INTESTINAL ADHESIONS    . LUMBAR [place]  . SKIN SURGERY       skin sarcomas removed  . TONSILLECTOMY     1955  . TONSILLECTOMY     Family History  Problem Relation Age of Onset  . Hypertension Mother   . Breast cancer Mother   . Hypothyroidism Mother   . Diabetes type II Mother   . Heart disease Mother   . Heart attack Father     CABG @ 69  . Breast cancer Sister     x  2  . Colon cancer Maternal Aunt   . Breast cancer Maternal Aunt   . Colon cancer Paternal Uncle   . CAD Father   . Pancreatic cancer Other     PGA  . Breast cancer Paternal Grandmother    History  Sexual Activity  . Sexual activity: Not on file    Outpatient Encounter Prescriptions as of 10/22/2015  Medication Sig  . aspirin 325 MG tablet Take 325 mg by mouth daily.  Marland Kitchen azithromycin (ZITHROMAX) 500 MG tablet TAKE 1 500 MG TABLET 1 HR PRIOR TO DENTAL WORK  . carvedilol (COREG) 6.25 MG tablet Take 1 tablet (6.25 mg total) by mouth 2 (two) times daily with a meal.  . cetirizine (ZYRTEC) 10 MG tablet Take 10 mg by mouth daily.  . fluticasone (FLONASE) 50 MCG/ACT nasal spray Place 1 spray into both nostrils daily.  . furosemide (LASIX) 20 MG tablet TAKE 1 TABLET EVERY DAY  FOR  FLUID  RETENTION  . loratadine (CLARITIN) 10 MG tablet Take 10 mg by mouth daily. Reported on 04/25/2015  . meclizine (ANTIVERT) 25 MG tablet Take 1 tablet (25 mg total) by mouth 3 (three) times daily. May take 1/2 tablet if you wish.  . Omega-3 Fatty Acids (FISH OIL) 1000 MG CAPS Take 2 capsules by mouth 2 (two) times daily.  Marland Kitchen omeprazole (PRILOSEC) 20 MG capsule Take 1 capsule (20 mg total) by mouth daily.  . potassium chloride (K-DUR,KLOR-CON) 10 MEQ tablet Take 1 tablet (10 mEq total) by mouth daily.  . ranitidine (ZANTAC) 150 MG tablet Take 150 mg by mouth daily as needed. For reflux  . simvastatin (ZOCOR) 40 MG tablet Take 1 tablet (40 mg total) by mouth at bedtime.  . [DISCONTINUED] fluticasone (FLONASE) 50 MCG/ACT nasal spray Place 1 spray into both nostrils daily.  Marland Kitchen  bismuth-metronidazole-tetracycline (PYLERA) 140-125-125 MG capsule Take 3 capsules by mouth 4 (four) times daily -  before meals and at bedtime. (Patient not taking: Reported on 10/22/2015)   No facility-administered encounter medications on file as of 10/22/2015.     Activities of Daily Living In your present state of health, do you have any difficulty performing the following activities: 10/22/2015  Hearing? N  Vision? N  Difficulty concentrating or making decisions? N  Walking or climbing stairs? N  Dressing or bathing? N  Doing errands, shopping? N  Preparing Food and eating ? N  Using the Toilet? N  In the past six months, have you accidently leaked urine? N  Do you have problems with loss of bowel control? Y  Managing your Medications? N  Managing your Finances? N  Housekeeping or managing your Housekeeping? N  Some recent data might be hidden    Patient Care Team: Janith Lima, MD as PCP - General (Internal Medicine) Larey Dresser, MD (Cardiology) Melrose Nakayama, MD (Cardiothoracic Surgery)    Assessment:     Very happy couple; enjoys their life together;  No issues; recent review by cardiology and GI  Declines flu vaccine  Wants to lose w few pounds  Exercise Activities and Dietary recommendations Current Exercise Habits: Home exercise routine, Time (Minutes): 60, Frequency (Times/Week): 3 (cleans, cooks and works in her garden every day), Weekly Exercise (Minutes/Week): 180, Intensity: Moderate  Goals    . Exercise 150 minutes per week (moderate activity)          Will start walking x 30 minutes 3 days a week  Will build up to this time and then  move on.  Keep journal    . Weight (lb) < 160 lb (72.6 kg)          Will try again to lose some weight;  Will drink more water      Fall Risk Fall Risk  10/22/2015 10/17/2014 10/11/2014 08/16/2013  Falls in the past year? No No No No   Depression Screen PHQ 2/9 Scores 10/22/2015 10/17/2014 10/11/2014 08/16/2013   PHQ - 2 Score 0 0 0 0     Cognitive Testing MMSE - Mini Mental State Exam 10/22/2015 10/11/2014  Not completed: (No Data) Unable to complete   Ad8 score 0; Mentally engaged   Immunization History  Administered Date(s) Administered  . Hep A / Hep B 10/17/2014, 04/16/2015  . Hepatitis B, adult 11/16/2014  . Pneumococcal Conjugate-13 08/16/2013  . Pneumococcal Polysaccharide-23 08/12/2011  . Td 02/21/2010  . Tdap 02/21/2010   Screening Tests Health Maintenance  Topic Date Due  . HEMOGLOBIN A1C  04/16/2015  . FOOT EXAM  10/17/2015  . URINE MICROALBUMIN  10/17/2015  . OPHTHALMOLOGY EXAM  12/03/2015 (Originally 08/25/2014)  . INFLUENZA VACCINE  09/02/2016 (Originally 09/03/2015)  . MAMMOGRAM  10/23/2016  . TETANUS/TDAP  02/22/2020  . COLONOSCOPY  04/29/2020  . DEXA SCAN  Completed  . ZOSTAVAX  Addressed  . Hepatitis C Screening  Completed  . PNA vac Low Risk Adult  Completed      Plan:   Fup with Dr. Ronnald Crawford in October for CPE and labs Will fup with eye doctor in October and will request copy of eye report. Glaucoma was ruled out    During the course of the visit the patient was educated and counseled about the following appropriate screening and preventive services:   Vaccines to include Pneumoccal, Influenza, Hepatitis B, Td, Zostavax, HCV  Declines flu  Electrocardiogram  Cardiovascular Disease/ following cardiology  Colorectal cancer screening  Bone density screening/neg 2016  Diabetes screening  Glaucoma screening neg 2017 Dr. Herbert Deaner   Mammography/PAP 2016;   Nutrition counseling   Patient Instructions (the written plan) was given to the patient.   Wynetta Fines, RN  10/22/2015

## 2015-10-22 NOTE — Progress Notes (Signed)
Medical screening examination/treatment/procedure(s) were performed by non-physician practitioner and as supervising physician I was immediately available for consultation/collaboration. I agree with above. Elizabeth A Crawford, MD 

## 2015-10-24 LAB — HM DEXA SCAN: HM DEXA SCAN: -1.7

## 2015-11-04 ENCOUNTER — Encounter: Payer: Self-pay | Admitting: Internal Medicine

## 2015-11-14 ENCOUNTER — Encounter: Payer: Self-pay | Admitting: Internal Medicine

## 2015-11-14 ENCOUNTER — Other Ambulatory Visit (INDEPENDENT_AMBULATORY_CARE_PROVIDER_SITE_OTHER): Payer: Commercial Managed Care - HMO

## 2015-11-14 ENCOUNTER — Ambulatory Visit (INDEPENDENT_AMBULATORY_CARE_PROVIDER_SITE_OTHER): Payer: Commercial Managed Care - HMO | Admitting: Internal Medicine

## 2015-11-14 VITALS — BP 140/60 | HR 54 | Temp 97.8°F | Ht 63.0 in | Wt 171.2 lb

## 2015-11-14 DIAGNOSIS — E118 Type 2 diabetes mellitus with unspecified complications: Secondary | ICD-10-CM

## 2015-11-14 DIAGNOSIS — Z1231 Encounter for screening mammogram for malignant neoplasm of breast: Secondary | ICD-10-CM | POA: Diagnosis not present

## 2015-11-14 DIAGNOSIS — Z803 Family history of malignant neoplasm of breast: Secondary | ICD-10-CM | POA: Diagnosis not present

## 2015-11-14 DIAGNOSIS — I10 Essential (primary) hypertension: Secondary | ICD-10-CM | POA: Diagnosis not present

## 2015-11-14 DIAGNOSIS — E785 Hyperlipidemia, unspecified: Secondary | ICD-10-CM

## 2015-11-14 DIAGNOSIS — I739 Peripheral vascular disease, unspecified: Secondary | ICD-10-CM

## 2015-11-14 DIAGNOSIS — I779 Disorder of arteries and arterioles, unspecified: Secondary | ICD-10-CM | POA: Diagnosis not present

## 2015-11-14 DIAGNOSIS — I2510 Atherosclerotic heart disease of native coronary artery without angina pectoris: Secondary | ICD-10-CM

## 2015-11-14 DIAGNOSIS — E781 Pure hyperglyceridemia: Secondary | ICD-10-CM | POA: Diagnosis not present

## 2015-11-14 DIAGNOSIS — Z23 Encounter for immunization: Secondary | ICD-10-CM

## 2015-11-14 DIAGNOSIS — Z0000 Encounter for general adult medical examination without abnormal findings: Secondary | ICD-10-CM | POA: Diagnosis not present

## 2015-11-14 LAB — URINALYSIS, ROUTINE W REFLEX MICROSCOPIC
BILIRUBIN URINE: NEGATIVE
HGB URINE DIPSTICK: NEGATIVE
KETONES UR: NEGATIVE
NITRITE: NEGATIVE
PH: 6 (ref 5.0–8.0)
RBC / HPF: NONE SEEN (ref 0–?)
Specific Gravity, Urine: <=1.005 — AB (ref 1.000–1.030)
TOTAL PROTEIN, URINE-UPE24: NEGATIVE
UROBILINOGEN UA: 0.2 (ref 0.0–1.0)
Urine Glucose: NEGATIVE

## 2015-11-14 LAB — CBC WITH DIFFERENTIAL/PLATELET
BASOS PCT: 0.3 % (ref 0.0–3.0)
Basophils Absolute: 0 10*3/uL (ref 0.0–0.1)
EOS ABS: 0.1 10*3/uL (ref 0.0–0.7)
EOS PCT: 1.3 % (ref 0.0–5.0)
HEMATOCRIT: 37.5 % (ref 36.0–46.0)
HEMOGLOBIN: 12.7 g/dL (ref 12.0–15.0)
LYMPHS PCT: 34.2 % (ref 12.0–46.0)
Lymphs Abs: 2.2 10*3/uL (ref 0.7–4.0)
MCHC: 33.9 g/dL (ref 30.0–36.0)
MCV: 88.5 fl (ref 78.0–100.0)
MONO ABS: 0.5 10*3/uL (ref 0.1–1.0)
Monocytes Relative: 7.6 % (ref 3.0–12.0)
Neutro Abs: 3.6 10*3/uL (ref 1.4–7.7)
Neutrophils Relative %: 56.6 % (ref 43.0–77.0)
Platelets: 222 10*3/uL (ref 150.0–400.0)
RBC: 4.24 Mil/uL (ref 3.87–5.11)
RDW: 12.7 % (ref 11.5–15.5)
WBC: 6.4 10*3/uL (ref 4.0–10.5)

## 2015-11-14 LAB — COMPREHENSIVE METABOLIC PANEL
ALT: 18 U/L (ref 0–35)
AST: 17 U/L (ref 0–37)
Albumin: 4.4 g/dL (ref 3.5–5.2)
Alkaline Phosphatase: 43 U/L (ref 39–117)
BILIRUBIN TOTAL: 0.5 mg/dL (ref 0.2–1.2)
BUN: 14 mg/dL (ref 6–23)
CHLORIDE: 102 meq/L (ref 96–112)
CO2: 30 meq/L (ref 19–32)
CREATININE: 0.7 mg/dL (ref 0.40–1.20)
Calcium: 9.4 mg/dL (ref 8.4–10.5)
GFR: 87.35 mL/min (ref >60.00)
GLUCOSE: 98 mg/dL (ref 70–99)
Potassium: 4 mEq/L (ref 3.5–5.1)
SODIUM: 140 meq/L (ref 135–145)
Total Protein: 7.3 g/dL (ref 6.0–8.3)

## 2015-11-14 LAB — MICROALBUMIN / CREATININE URINE RATIO
Creatinine,U: 10.9 mg/dL
MICROALB/CREAT RATIO: 6.4 mg/g (ref 0.0–30.0)
Microalb, Ur: <0.7 mg/dL (ref 0.0–1.9)

## 2015-11-14 LAB — LIPID PANEL
CHOL/HDL RATIO: 4
CHOLESTEROL: 154 mg/dL (ref 0–200)
HDL: 43.9 mg/dL (ref >39.00)
NONHDL: 110.5
Triglycerides: 209 mg/dL — ABNORMAL HIGH (ref 0.0–149.0)
VLDL: 41.8 mg/dL — ABNORMAL HIGH (ref 0.0–40.0)

## 2015-11-14 LAB — TSH: TSH: 4.84 u[IU]/mL — AB (ref 0.35–4.50)

## 2015-11-14 LAB — HEMOGLOBIN A1C: HEMOGLOBIN A1C: 6.8 % — AB (ref 4.6–6.5)

## 2015-11-14 LAB — LDL CHOLESTEROL, DIRECT: Direct LDL: 87 mg/dL

## 2015-11-14 NOTE — Patient Instructions (Signed)
Preventive Care for Adults, Female A healthy lifestyle and preventive care can promote health and wellness. Preventive health guidelines for women include the following key practices.  A routine yearly physical is a good way to check with your health care provider about your health and preventive screening. It is a chance to share any concerns and updates on your health and to receive a thorough exam.  Visit your dentist for a routine exam and preventive care every 6 months. Brush your teeth twice a day and floss once a day. Good oral hygiene prevents tooth decay and gum disease.  The frequency of eye exams is based on your age, health, family medical history, use of contact lenses, and other factors. Follow your health care provider's recommendations for frequency of eye exams.  Eat a healthy diet. Foods like vegetables, fruits, whole grains, low-fat dairy products, and lean protein foods contain the nutrients you need without too many calories. Decrease your intake of foods high in solid fats, added sugars, and salt. Eat the right amount of calories for you.Get information about a proper diet from your health care provider, if necessary.  Regular physical exercise is one of the most important things you can do for your health. Most adults should get at least 150 minutes of moderate-intensity exercise (any activity that increases your heart rate and causes you to sweat) each week. In addition, most adults need muscle-strengthening exercises on 2 or more days a week.  Maintain a healthy weight. The body mass index (BMI) is a screening tool to identify possible weight problems. It provides an estimate of body fat based on height and weight. Your health care provider can find your BMI and can help you achieve or maintain a healthy weight.For adults 20 years and older:  A BMI below 18.5 is considered underweight.  A BMI of 18.5 to 24.9 is normal.  A BMI of 25 to 29.9 is considered overweight.  A  BMI of 30 and above is considered obese.  Maintain normal blood lipids and cholesterol levels by exercising and minimizing your intake of saturated fat. Eat a balanced diet with plenty of fruit and vegetables. Blood tests for lipids and cholesterol should begin at age 45 and be repeated every 5 years. If your lipid or cholesterol levels are high, you are over 50, or you are at high risk for heart disease, you may need your cholesterol levels checked more frequently.Ongoing high lipid and cholesterol levels should be treated with medicines if diet and exercise are not working.  If you smoke, find out from your health care provider how to quit. If you do not use tobacco, do not start.  Lung cancer screening is recommended for adults aged 45-80 years who are at high risk for developing lung cancer because of a history of smoking. A yearly low-dose CT scan of the lungs is recommended for people who have at least a 30-pack-year history of smoking and are a current smoker or have quit within the past 15 years. A pack year of smoking is smoking an average of 1 pack of cigarettes a day for 1 year (for example: 1 pack a day for 30 years or 2 packs a day for 15 years). Yearly screening should continue until the smoker has stopped smoking for at least 15 years. Yearly screening should be stopped for people who develop a health problem that would prevent them from having lung cancer treatment.  If you are pregnant, do not drink alcohol. If you are  breastfeeding, be very cautious about drinking alcohol. If you are not pregnant and choose to drink alcohol, do not have more than 1 drink per day. One drink is considered to be 12 ounces (355 mL) of beer, 5 ounces (148 mL) of wine, or 1.5 ounces (44 mL) of liquor.  Avoid use of street drugs. Do not share needles with anyone. Ask for help if you need support or instructions about stopping the use of drugs.  High blood pressure causes heart disease and increases the risk  of stroke. Your blood pressure should be checked at least every 1 to 2 years. Ongoing high blood pressure should be treated with medicines if weight loss and exercise do not work.  If you are 55-79 years old, ask your health care provider if you should take aspirin to prevent strokes.  Diabetes screening is done by taking a blood sample to check your blood glucose level after you have not eaten for a certain period of time (fasting). If you are not overweight and you do not have risk factors for diabetes, you should be screened once every 3 years starting at age 45. If you are overweight or obese and you are 40-70 years of age, you should be screened for diabetes every year as part of your cardiovascular risk assessment.  Breast cancer screening is essential preventive care for women. You should practice "breast self-awareness." This means understanding the normal appearance and feel of your breasts and may include breast self-examination. Any changes detected, no matter how small, should be reported to a health care provider. Women in their 20s and 30s should have a clinical breast exam (CBE) by a health care provider as part of a regular health exam every 1 to 3 years. After age 40, women should have a CBE every year. Starting at age 40, women should consider having a mammogram (breast X-ray test) every year. Women who have a family history of breast cancer should talk to their health care provider about genetic screening. Women at a high risk of breast cancer should talk to their health care providers about having an MRI and a mammogram every year.  Breast cancer gene (BRCA)-related cancer risk assessment is recommended for women who have family members with BRCA-related cancers. BRCA-related cancers include breast, ovarian, tubal, and peritoneal cancers. Having family members with these cancers may be associated with an increased risk for harmful changes (mutations) in the breast cancer genes BRCA1 and  BRCA2. Results of the assessment will determine the need for genetic counseling and BRCA1 and BRCA2 testing.  Your health care provider may recommend that you be screened regularly for cancer of the pelvic organs (ovaries, uterus, and vagina). This screening involves a pelvic examination, including checking for microscopic changes to the surface of your cervix (Pap test). You may be encouraged to have this screening done every 3 years, beginning at age 21.  For women ages 30-65, health care providers may recommend pelvic exams and Pap testing every 3 years, or they may recommend the Pap and pelvic exam, combined with testing for human papilloma virus (HPV), every 5 years. Some types of HPV increase your risk of cervical cancer. Testing for HPV may also be done on women of any age with unclear Pap test results.  Other health care providers may not recommend any screening for nonpregnant women who are considered low risk for pelvic cancer and who do not have symptoms. Ask your health care provider if a screening pelvic exam is right for   you.  If you have had past treatment for cervical cancer or a condition that could lead to cancer, you need Pap tests and screening for cancer for at least 20 years after your treatment. If Pap tests have been discontinued, your risk factors (such as having a new sexual partner) need to be reassessed to determine if screening should resume. Some women have medical problems that increase the chance of getting cervical cancer. In these cases, your health care provider may recommend more frequent screening and Pap tests.  Colorectal cancer can be detected and often prevented. Most routine colorectal cancer screening begins at the age of 50 years and continues through age 75 years. However, your health care provider may recommend screening at an earlier age if you have risk factors for colon cancer. On a yearly basis, your health care provider may provide home test kits to check  for hidden blood in the stool. Use of a small camera at the end of a tube, to directly examine the colon (sigmoidoscopy or colonoscopy), can detect the earliest forms of colorectal cancer. Talk to your health care provider about this at age 50, when routine screening begins. Direct exam of the colon should be repeated every 5-10 years through age 75 years, unless early forms of precancerous polyps or small growths are found.  People who are at an increased risk for hepatitis B should be screened for this virus. You are considered at high risk for hepatitis B if:  You were born in a country where hepatitis B occurs often. Talk with your health care provider about which countries are considered high risk.  Your parents were born in a high-risk country and you have not received a shot to protect against hepatitis B (hepatitis B vaccine).  You have HIV or AIDS.  You use needles to inject street drugs.  You live with, or have sex with, someone who has hepatitis B.  You get hemodialysis treatment.  You take certain medicines for conditions like cancer, organ transplantation, and autoimmune conditions.  Hepatitis C blood testing is recommended for all people born from 1945 through 1965 and any individual with known risks for hepatitis C.  Practice safe sex. Use condoms and avoid high-risk sexual practices to reduce the spread of sexually transmitted infections (STIs). STIs include gonorrhea, chlamydia, syphilis, trichomonas, herpes, HPV, and human immunodeficiency virus (HIV). Herpes, HIV, and HPV are viral illnesses that have no cure. They can result in disability, cancer, and death.  You should be screened for sexually transmitted illnesses (STIs) including gonorrhea and chlamydia if:  You are sexually active and are younger than 24 years.  You are older than 24 years and your health care provider tells you that you are at risk for this type of infection.  Your sexual activity has changed  since you were last screened and you are at an increased risk for chlamydia or gonorrhea. Ask your health care provider if you are at risk.  If you are at risk of being infected with HIV, it is recommended that you take a prescription medicine daily to prevent HIV infection. This is called preexposure prophylaxis (PrEP). You are considered at risk if:  You are sexually active and do not regularly use condoms or know the HIV status of your partner(s).  You take drugs by injection.  You are sexually active with a partner who has HIV.  Talk with your health care provider about whether you are at high risk of being infected with HIV. If   you choose to begin PrEP, you should first be tested for HIV. You should then be tested every 3 months for as long as you are taking PrEP.  Osteoporosis is a disease in which the bones lose minerals and strength with aging. This can result in serious bone fractures or breaks. The risk of osteoporosis can be identified using a bone density scan. Women ages 67 years and over and women at risk for fractures or osteoporosis should discuss screening with their health care providers. Ask your health care provider whether you should take a calcium supplement or vitamin D to reduce the rate of osteoporosis.  Menopause can be associated with physical symptoms and risks. Hormone replacement therapy is available to decrease symptoms and risks. You should talk to your health care provider about whether hormone replacement therapy is right for you.  Use sunscreen. Apply sunscreen liberally and repeatedly throughout the day. You should seek shade when your shadow is shorter than you. Protect yourself by wearing long sleeves, pants, a wide-brimmed hat, and sunglasses year round, whenever you are outdoors.  Once a month, do a whole body skin exam, using a mirror to look at the skin on your back. Tell your health care provider of new moles, moles that have irregular borders, moles that  are larger than a pencil eraser, or moles that have changed in shape or color.  Stay current with required vaccines (immunizations).  Influenza vaccine. All adults should be immunized every year.  Tetanus, diphtheria, and acellular pertussis (Td, Tdap) vaccine. Pregnant women should receive 1 dose of Tdap vaccine during each pregnancy. The dose should be obtained regardless of the length of time since the last dose. Immunization is preferred during the 27th-36th week of gestation. An adult who has not previously received Tdap or who does not know her vaccine status should receive 1 dose of Tdap. This initial dose should be followed by tetanus and diphtheria toxoids (Td) booster doses every 10 years. Adults with an unknown or incomplete history of completing a 3-dose immunization series with Td-containing vaccines should begin or complete a primary immunization series including a Tdap dose. Adults should receive a Td booster every 10 years.  Varicella vaccine. An adult without evidence of immunity to varicella should receive 2 doses or a second dose if she has previously received 1 dose. Pregnant females who do not have evidence of immunity should receive the first dose after pregnancy. This first dose should be obtained before leaving the health care facility. The second dose should be obtained 4-8 weeks after the first dose.  Human papillomavirus (HPV) vaccine. Females aged 13-26 years who have not received the vaccine previously should obtain the 3-dose series. The vaccine is not recommended for use in pregnant females. However, pregnancy testing is not needed before receiving a dose. If a female is found to be pregnant after receiving a dose, no treatment is needed. In that case, the remaining doses should be delayed until after the pregnancy. Immunization is recommended for any person with an immunocompromised condition through the age of 61 years if she did not get any or all doses earlier. During the  3-dose series, the second dose should be obtained 4-8 weeks after the first dose. The third dose should be obtained 24 weeks after the first dose and 16 weeks after the second dose.  Zoster vaccine. One dose is recommended for adults aged 30 years or older unless certain conditions are present.  Measles, mumps, and rubella (MMR) vaccine. Adults born  before 1957 generally are considered immune to measles and mumps. Adults born in 1957 or later should have 1 or more doses of MMR vaccine unless there is a contraindication to the vaccine or there is laboratory evidence of immunity to each of the three diseases. A routine second dose of MMR vaccine should be obtained at least 28 days after the first dose for students attending postsecondary schools, health care workers, or international travelers. People who received inactivated measles vaccine or an unknown type of measles vaccine during 1963-1967 should receive 2 doses of MMR vaccine. People who received inactivated mumps vaccine or an unknown type of mumps vaccine before 1979 and are at high risk for mumps infection should consider immunization with 2 doses of MMR vaccine. For females of childbearing age, rubella immunity should be determined. If there is no evidence of immunity, females who are not pregnant should be vaccinated. If there is no evidence of immunity, females who are pregnant should delay immunization until after pregnancy. Unvaccinated health care workers born before 1957 who lack laboratory evidence of measles, mumps, or rubella immunity or laboratory confirmation of disease should consider measles and mumps immunization with 2 doses of MMR vaccine or rubella immunization with 1 dose of MMR vaccine.  Pneumococcal 13-valent conjugate (PCV13) vaccine. When indicated, a person who is uncertain of his immunization history and has no record of immunization should receive the PCV13 vaccine. All adults 65 years of age and older should receive this  vaccine. An adult aged 19 years or older who has certain medical conditions and has not been previously immunized should receive 1 dose of PCV13 vaccine. This PCV13 should be followed with a dose of pneumococcal polysaccharide (PPSV23) vaccine. Adults who are at high risk for pneumococcal disease should obtain the PPSV23 vaccine at least 8 weeks after the dose of PCV13 vaccine. Adults older than 72 years of age who have normal immune system function should obtain the PPSV23 vaccine dose at least 1 year after the dose of PCV13 vaccine.  Pneumococcal polysaccharide (PPSV23) vaccine. When PCV13 is also indicated, PCV13 should be obtained first. All adults aged 65 years and older should be immunized. An adult younger than age 65 years who has certain medical conditions should be immunized. Any person who resides in a nursing home or long-term care facility should be immunized. An adult smoker should be immunized. People with an immunocompromised condition and certain other conditions should receive both PCV13 and PPSV23 vaccines. People with human immunodeficiency virus (HIV) infection should be immunized as soon as possible after diagnosis. Immunization during chemotherapy or radiation therapy should be avoided. Routine use of PPSV23 vaccine is not recommended for American Indians, [place] Natives, or people younger than 65 years unless there are medical conditions that require PPSV23 vaccine. When indicated, people who have unknown immunization and have no record of immunization should receive PPSV23 vaccine. One-time revaccination 5 years after the first dose of PPSV23 is recommended for people aged 19-64 years who have chronic kidney failure, nephrotic syndrome, asplenia, or immunocompromised conditions. People who received 1-2 doses of PPSV23 before age 65 years should receive another dose of PPSV23 vaccine at age 65 years or later if at least 5 years have passed since the previous dose. Doses of PPSV23 are not  needed for people immunized with PPSV23 at or after age 65 years.  Meningococcal vaccine. Adults with asplenia or persistent complement component deficiencies should receive 2 doses of quadrivalent meningococcal conjugate (MenACWY-D) vaccine. The doses should be obtained   at least 2 months apart. Microbiologists working with certain meningococcal bacteria, [place] recruits, people at risk during an outbreak, and people who travel to or live in countries with a high rate of meningitis should be immunized. A first-year college student up through age 34 years who is living in a residence hall should receive a dose if she did not receive a dose on or after her 16th birthday. Adults who have certain high-risk conditions should receive one or more doses of vaccine.  Hepatitis A vaccine. Adults who wish to be protected from this disease, have certain high-risk conditions, work with hepatitis A-infected animals, work in hepatitis A research labs, or travel to or work in countries with a high rate of hepatitis A should be immunized. Adults who were previously unvaccinated and who anticipate close contact with an international adoptee during the first 60 days after arrival in the [place] from a country with a high rate of hepatitis A should be immunized.  Hepatitis B vaccine. Adults who wish to be protected from this disease, have certain high-risk conditions, may be exposed to blood or other infectious body fluids, are household contacts or sex partners of hepatitis B positive people, are clients or workers in certain care facilities, or travel to or work in countries with a high rate of hepatitis B should be immunized.  Haemophilus influenzae type b (Hib) vaccine. A previously unvaccinated person with asplenia or sickle cell disease or having a scheduled splenectomy should receive 1 dose of Hib vaccine. Regardless of previous immunization, a recipient of a hematopoietic stem cell transplant should receive a  3-dose series 6-12 months after her successful transplant. Hib vaccine is not recommended for adults with HIV infection. Preventive Services / Frequency Ages 35 to 4 years  Blood pressure check.** / Every 3-5 years.  Lipid and cholesterol check.** / Every 5 years beginning at age 60.  Clinical breast exam.** / Every 3 years for women in their 71s and 10s.  BRCA-related cancer risk assessment.** / For women who have family members with a BRCA-related cancer (breast, ovarian, tubal, or peritoneal cancers).  Pap test.** / Every 2 years from ages 76 through 26. Every 3 years starting at age 61 through age 76 or 93 with a history of 3 consecutive normal Pap tests.  HPV screening.** / Every 3 years from ages 37 through ages 60 to 51 with a history of 3 consecutive normal Pap tests.  Hepatitis C blood test.** / For any individual with known risks for hepatitis C.  Skin self-exam. / Monthly.  Influenza vaccine. / Every year.  Tetanus, diphtheria, and acellular pertussis (Tdap, Td) vaccine.** / Consult your health care provider. Pregnant women should receive 1 dose of Tdap vaccine during each pregnancy. 1 dose of Td every 10 years.  Varicella vaccine.** / Consult your health care provider. Pregnant females who do not have evidence of immunity should receive the first dose after pregnancy.  HPV vaccine. / 3 doses over 6 months, if 93 and younger. The vaccine is not recommended for use in pregnant females. However, pregnancy testing is not needed before receiving a dose.  Measles, mumps, rubella (MMR) vaccine.** / You need at least 1 dose of MMR if you were born in 1957 or later. You may also need a 2nd dose. For females of childbearing age, rubella immunity should be determined. If there is no evidence of immunity, females who are not pregnant should be vaccinated. If there is no evidence of immunity, females who are  pregnant should delay immunization until after pregnancy.  Pneumococcal  13-valent conjugate (PCV13) vaccine.** / Consult your health care provider.  Pneumococcal polysaccharide (PPSV23) vaccine.** / 1 to 2 doses if you smoke cigarettes or if you have certain conditions.  Meningococcal vaccine.** / 1 dose if you are age 68 to 8 years and a Market researcher living in a residence hall, or have one of several medical conditions, you need to get vaccinated against meningococcal disease. You may also need additional booster doses.  Hepatitis A vaccine.** / Consult your health care provider.  Hepatitis B vaccine.** / Consult your health care provider.  Haemophilus influenzae type b (Hib) vaccine.** / Consult your health care provider. Ages 7 to 53 years  Blood pressure check.** / Every year.  Lipid and cholesterol check.** / Every 5 years beginning at age 25 years.  Lung cancer screening. / Every year if you are aged 11-80 years and have a 30-pack-year history of smoking and currently smoke or have quit within the past 15 years. Yearly screening is stopped once you have quit smoking for at least 15 years or develop a health problem that would prevent you from having lung cancer treatment.  Clinical breast exam.** / Every year after age 48 years.  BRCA-related cancer risk assessment.** / For women who have family members with a BRCA-related cancer (breast, ovarian, tubal, or peritoneal cancers).  Mammogram.** / Every year beginning at age 41 years and continuing for as long as you are in good health. Consult with your health care provider.  Pap test.** / Every 3 years starting at age 65 years through age 37 or 70 years with a history of 3 consecutive normal Pap tests.  HPV screening.** / Every 3 years from ages 72 years through ages 60 to 40 years with a history of 3 consecutive normal Pap tests.  Fecal occult blood test (FOBT) of stool. / Every year beginning at age 21 years and continuing until age 5 years. You may not need to do this test if you get  a colonoscopy every 10 years.  Flexible sigmoidoscopy or colonoscopy.** / Every 5 years for a flexible sigmoidoscopy or every 10 years for a colonoscopy beginning at age 35 years and continuing until age 48 years.  Hepatitis C blood test.** / For all people born from [birth year] through 1965 and any individual with known risks for hepatitis C.  Skin self-exam. / Monthly.  Influenza vaccine. / Every year.  Tetanus, diphtheria, and acellular pertussis (Tdap/Td) vaccine.** / Consult your health care provider. Pregnant women should receive 1 dose of Tdap vaccine during each pregnancy. 1 dose of Td every 10 years.  Varicella vaccine.** / Consult your health care provider. Pregnant females who do not have evidence of immunity should receive the first dose after pregnancy.  Zoster vaccine.** / 1 dose for adults aged 30 years or older.  Measles, mumps, rubella (MMR) vaccine.** / You need at least 1 dose of MMR if you were born in 1957 or later. You may also need a second dose. For females of childbearing age, rubella immunity should be determined. If there is no evidence of immunity, females who are not pregnant should be vaccinated. If there is no evidence of immunity, females who are pregnant should delay immunization until after pregnancy.  Pneumococcal 13-valent conjugate (PCV13) vaccine.** / Consult your health care provider.  Pneumococcal polysaccharide (PPSV23) vaccine.** / 1 to 2 doses if you smoke cigarettes or if you have certain conditions.  Meningococcal vaccine.** /  Consult your health care provider.  Hepatitis A vaccine.** / Consult your health care provider.  Hepatitis B vaccine.** / Consult your health care provider.  Haemophilus influenzae type b (Hib) vaccine.** / Consult your health care provider. Ages 64 years and over  Blood pressure check.** / Every year.  Lipid and cholesterol check.** / Every 5 years beginning at age 23 years.  Lung cancer screening. / Every year if you  are aged 16-80 years and have a 30-pack-year history of smoking and currently smoke or have quit within the past 15 years. Yearly screening is stopped once you have quit smoking for at least 15 years or develop a health problem that would prevent you from having lung cancer treatment.  Clinical breast exam.** / Every year after age 74 years.  BRCA-related cancer risk assessment.** / For women who have family members with a BRCA-related cancer (breast, ovarian, tubal, or peritoneal cancers).  Mammogram.** / Every year beginning at age 44 years and continuing for as long as you are in good health. Consult with your health care provider.  Pap test.** / Every 3 years starting at age 58 years through age 22 or 39 years with 3 consecutive normal Pap tests. Testing can be stopped between 65 and 70 years with 3 consecutive normal Pap tests and no abnormal Pap or HPV tests in the past 10 years.  HPV screening.** / Every 3 years from ages 64 years through ages 70 or 61 years with a history of 3 consecutive normal Pap tests. Testing can be stopped between 65 and 70 years with 3 consecutive normal Pap tests and no abnormal Pap or HPV tests in the past 10 years.  Fecal occult blood test (FOBT) of stool. / Every year beginning at age 40 years and continuing until age 27 years. You may not need to do this test if you get a colonoscopy every 10 years.  Flexible sigmoidoscopy or colonoscopy.** / Every 5 years for a flexible sigmoidoscopy or every 10 years for a colonoscopy beginning at age 7 years and continuing until age 32 years.  Hepatitis C blood test.** / For all people born from [birth year] through 1967 and any individual with known risks for hepatitis C.  Osteoporosis screening.** / A one-time screening for women ages 30 years and over and women at risk for fractures or osteoporosis.  Skin self-exam. / Monthly.  Influenza vaccine. / Every year.  Tetanus, diphtheria, and acellular pertussis (Tdap/Td)  vaccine.** / 1 dose of Td every 10 years.  Varicella vaccine.** / Consult your health care provider.  Zoster vaccine.** / 1 dose for adults aged 35 years or older.  Pneumococcal 13-valent conjugate (PCV13) vaccine.** / Consult your health care provider.  Pneumococcal polysaccharide (PPSV23) vaccine.** / 1 dose for all adults aged 46 years and older.  Meningococcal vaccine.** / Consult your health care provider.  Hepatitis A vaccine.** / Consult your health care provider.  Hepatitis B vaccine.** / Consult your health care provider.  Haemophilus influenzae type b (Hib) vaccine.** / Consult your health care provider. ** Family history and personal history of risk and conditions may change your health care provider's recommendations.   This information is not intended to replace advice given to you by your health care provider. Make sure you discuss any questions you have with your health care provider.   Document Released: 03/17/2001 Document Revised: 02/09/2014 Document Reviewed: 06/16/2010 Elsevier Interactive Patient Education [employer].

## 2015-11-14 NOTE — Progress Notes (Signed)
Subjective:  Patient ID: Heather Crawford, female    DOB: 07/30/1945  Age: 70 y.o. MRN: [id number]  CC: Annual Exam; Diabetes; Hypertension; and Hyperlipidemia   HPI EARLINE TANKS presents for CPX/AWV.  She tells me her blood sugars have been well controlled. She has had no recent episodes of visual disturbance, polyuria, polydipsia, or polyphagia. She is tolerating her cholesterol medicine well with no muscle or joint aches.  She tells me her blood pressure is well-controlled and she denies chest pain, shortness of breath, palpitations, DOE, edema, or fatigue.   Past Medical History:  Diagnosis Date  . Allergic rhinitis, cause unspecified   . Aortic stenosis    a.  echo 1/12 w/mildly dilated LV, EF 45-50% w/paradoxical septal motion consistent w/ LBBB, moderate aortic stenosis w/mean gradient 27 mmHg, trivial AI, mild to moderate MR w/calcified mitral valve;   b. s/p AVR with 21 mm Magna Ease valve  . Aortic valve replaced 2012  . Arthritis    joint pain  . CAD (coronary artery disease)    a. s/p CABG 4/12: RIMA-RCA  . CAD, multiple vessel    sees Dr Aundra Dubin every 3-4 months  . Cancer ([place])    skin sarcomas removed  . CHF (congestive heart failure) (H[facility])    chronic diastolic CHF  . Chronic back pain    H/O BACK SURGERY  . Coronary artery disease    adenosine myoview 2/12 w/EF 56%, ISCHEMIA W/SCAR IN THE MID TO APICAL ANTERIO WALL, SEPTAL WALL, AND APEX. LHC 3/12 W/EF 55%, 40% OSTIAL RCA, 60% MID RCA, 95% DISTAL RC A, INTERVENTION  ATTEMPTED BUT UNABLE TO ADEQUADATELY SEAT  CATHETER.  . Diverticulitis of colon (without mention of hemorrhage)(562.11)   . Diverticulosis   . Esophageal reflux   . Glucose intolerance (impaired glucose tolerance)    A1C 6.3 (4/12)  . H/O hiatal hernia   . H/O vertigo    with fluid  in ears  . Heart murmur   . Hemorrhoids   . Hemorrhoids   . History of rheumatic fever   . Hx of hysterectomy   . Hx: UTI (urinary tract infection)   .  Hyperlipidemia   . LBBB (left bundle branch block)   . Myocardial infarction 2002  . Pneumonia   . PONV (postoperative nausea and vomiting)   . 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 Snoring   . Urinary frequency    Past Surgical History:  Procedure Laterality Date  . ABDOMINAL HYSTERECTOMY    . APPENDECTOMY  1973  . BACK SURGERY     Lumbar Lam and discectomy x 2  . BREAST BIOPSY Right 10/2011  . BREAST LUMPECTOMY Right   . CARDIAC CATHETERIZATION  2012  . CARDIAC VALVE REPLACEMENT  2012   Aortic  . CORONARY ARTERY BYPASS GRAFT  2012  . DILATION AND CURETTAGE OF UTERUS    . ENDOMETRIAL ABLATION    . LAPAROSCOPIC LYSIS INTESTINAL ADHESIONS    . LUMBAR [place]  . SKIN SURGERY     skin sarcomas removed  . TONSILLECTOMY     1955  . TONSILLECTOMY      reports that she quit smoking about 15 years ago. Her smoking use included Cigarettes. She has a 40.00 pack-year smoking history. She has quit using smokeless tobacco. Her smokeless tobacco use included Chew. She reports that she drinks alcohol. She reports that she does not use drugs. family  history includes Breast cancer in her maternal aunt, mother, paternal grandmother, and sister; CAD in her father; Colon cancer in her maternal aunt and paternal uncle; Diabetes type II in her mother; Heart attack in her father; Heart disease in her mother; Hypertension in her mother; Hypothyroidism in her mother; Pancreatic cancer in her other. Allergies  Allergen Reactions  . Tape Hives    Breakouts from the adhesive in paper tape and patches  . Amoxicillin Itching  . Crestor [Rosuvastatin] Other (See Comments)    myalgia  . Lipitor [Atorvastatin] Other (See Comments)    myalgia  . Neomycin Itching    Topical neomycin   . Sulfamethoxazole-Trimethoprim Hives    Outpatient Medications Prior to Visit  Medication Sig Dispense Refill  . aspirin 325 MG tablet Take 325 mg by mouth daily.    . carvedilol (COREG) 6.25  MG tablet Take 1 tablet (6.25 mg total) by mouth 2 (two) times daily with a meal. 180 tablet 2  . cetirizine (ZYRTEC) 10 MG tablet Take 10 mg by mouth daily.    . fluticasone (FLONASE) 50 MCG/ACT nasal spray Place 1 spray into both nostrils daily. 29.7 g 3  . furosemide (LASIX) 20 MG tablet TAKE 1 TABLET EVERY DAY  FOR  FLUID  RETENTION 90 tablet 2  . loratadine (CLARITIN) 10 MG tablet Take 10 mg by mouth daily. Reported on 04/25/2015    . Omega-3 Fatty Acids (FISH OIL) 1000 MG CAPS Take 2 capsules by mouth 2 (two) times daily.    . potassium chloride (K-DUR,KLOR-CON) 10 MEQ tablet Take 1 tablet (10 mEq total) by mouth daily. 90 tablet 2  . ranitidine (ZANTAC) 150 MG tablet Take 150 mg by mouth daily as needed. For reflux    . simvastatin (ZOCOR) 40 MG tablet Take 1 tablet (40 mg total) by mouth at bedtime. 90 tablet 3  . azithromycin (ZITHROMAX) 500 MG tablet TAKE 1 500 MG TABLET 1 HR PRIOR TO DENTAL WORK (Patient not taking: Reported on 11/14/2015) 1 tablet 0  . meclizine (ANTIVERT) 25 MG tablet Take 1 tablet (25 mg total) by mouth 3 (three) times daily. May take 1/2 tablet if you wish. (Patient not taking: Reported on 11/14/2015) 270 tablet 0  . bismuth-metronidazole-tetracycline (PYLERA) 140-125-125 MG capsule Take 3 capsules by mouth 4 (four) times daily -  before meals and at bedtime. (Patient not taking: Reported on 11/14/2015) 120 capsule 0  . omeprazole (PRILOSEC) 20 MG capsule Take 1 capsule (20 mg total) by mouth daily. 20 capsule 0   No facility-administered medications prior to visit.     ROS Review of Systems  Constitutional: Negative.  Negative for activity change, appetite change, chills, diaphoresis, fatigue, fever and unexpected weight change.  HENT: Negative.  Negative for trouble swallowing.   Eyes: Negative.  Negative for photophobia and visual disturbance.  Respiratory: Negative.  Negative for cough, choking, chest tightness, shortness of breath and stridor.     Cardiovascular: Negative.  Negative for chest pain, palpitations and leg swelling.  Gastrointestinal: Negative.  Negative for abdominal pain, blood in stool, constipation, diarrhea, nausea and vomiting.  Endocrine: Negative.   Genitourinary: Negative.   Musculoskeletal: Negative.  Negative for arthralgias, back pain, joint swelling, myalgias and neck pain.  Skin: Negative.  Negative for color change, pallor and rash.  Allergic/Immunologic: Negative.   Neurological: Negative.  Negative for dizziness.  Hematological: Negative for adenopathy. Does not bruise/bleed easily.  Psychiatric/Behavioral: Negative.     Objective:  BP 140/60 (BP Location: Left  Arm, Patient Position: Sitting, Cuff Size: Large)   Pulse (!) 54   Temp 97.8 F (36.6 C) (Oral)   Ht 5\' 3"  (1.6 m)   Wt 171 lb 4 oz (77.7 kg)   SpO2 94%   BMI 30.34 kg/m   BP Readings from Last 3 Encounters:  11/14/15 140/60  10/22/15 118/60  07/26/15 132/68    Wt Readings from Last 3 Encounters:  11/14/15 171 lb 4 oz (77.7 kg)  10/22/15 172 lb (78 kg)  07/26/15 170 lb (77.1 kg)    Physical Exam  Constitutional: She is oriented to person, place, and time. She appears well-developed and well-nourished. No distress.  HENT:  Mouth/Throat: Oropharynx is clear and moist. No oropharyngeal exudate.  Eyes: Conjunctivae are normal. Right eye exhibits no discharge. Left eye exhibits no discharge. No scleral icterus.  Neck: Normal range of motion. Neck supple. No JVD present. No tracheal deviation present. No thyromegaly present.  Cardiovascular: Normal rate, regular rhythm, normal heart sounds and intact distal pulses.  Exam reveals no gallop and no friction rub.   No murmur heard. Pulmonary/Chest: Effort normal and breath sounds normal. No stridor. No respiratory distress. She has no wheezes. She has no rales. She exhibits no tenderness.  Abdominal: Soft. Bowel sounds are normal. She exhibits no distension and no mass. There is no  tenderness. There is no rebound and no guarding.  Musculoskeletal: Normal range of motion. She exhibits no edema, tenderness or deformity.  Lymphadenopathy:    She has no cervical adenopathy.  Neurological: She is oriented to person, place, and time.  Skin: Skin is warm and dry. No rash noted. She is not diaphoretic. No erythema. No pallor.  Vitals reviewed.   Lab Results  Component Value Date   WBC 6.4 11/14/2015   HGB 12.7 11/14/2015   HCT 37.5 11/14/2015   PLT 222.0 11/14/2015   GLUCOSE 98 11/14/2015   CHOL 154 11/14/2015   TRIG 209.0 (H) 11/14/2015   HDL 43.90 11/14/2015   LDLDIRECT 87.0 11/14/2015   LDLCALC 60 05/09/2015   ALT 18 11/14/2015   AST 17 11/14/2015   NA 140 11/14/2015   K 4.0 11/14/2015   CL 102 11/14/2015   CREATININE 0.70 11/14/2015   BUN 14 11/14/2015   CO2 30 11/14/2015   TSH 4.84 (H) 11/14/2015   INR 3.1 06/18/2010   HGBA1C 6.8 (H) 11/14/2015   MICROALBUR <0.7 11/14/2015    No results found.  Assessment & Plan:   Jaelyn was seen today for annual exam, diabetes, hypertension and hyperlipidemia.  Diagnoses and all orders for this visit:  Atherosclerosis of native coronary artery of native heart without angina pectoris- he has had no recent episodes of angina, will continue to mitigate her risk factors with statin therapy and blood pressure control. -     Lipid panel; Future  Bilateral carotid artery disease ([place])- no complications noted, will continue statin therapy and blood pressure control. -     Lipid panel; Future  Essential hypertension- her blood pressure is well-controlled, electrolytes and renal function are stable. -     Comprehensive metabolic panel; Future -     CBC with Differential/Platelet; Future -     Urinalysis, Routine w reflex microscopic (not at [facility]); Future  Type 2 diabetes mellitus with complication, without long-term current use of insulin ([place])- her A1c is 6.8%, her blood sugars are adequately well controlled. -      Comprehensive metabolic panel; Future -     Hemoglobin A1c; Future -  Microalbumin / creatinine urine ratio; Future  Hyperlipidemia with target LDL less than 70- she has achieved her LDL goal is doing well on statin therapy. -     Lipid panel; Future -     TSH; Future  Hypertriglyceridemia- improvement noted. -     Lipid panel; Future  Need for prophylactic vaccination and inoculation against influenza -     Cancel: Flu vaccine HIGH DOSE PF (Fluzone High dose)  Routine health maintenance   I have discontinued Ms. Kluge's omeprazole and bismuth-metronidazole-tetracycline. I am also having her maintain her ranitidine, aspirin, loratadine, cetirizine, meclizine, azithromycin, Fish Oil, potassium chloride, carvedilol, simvastatin, furosemide, and fluticasone.  No orders of the defined types were placed in this encounter.  See AVS for instructions about healthy living and anticipatory guidance.  Follow-up: Return in about 6 months (around 05/14/2016).  Scarlette Calico, MD

## 2015-11-14 NOTE — Progress Notes (Signed)
Pre visit review using our clinic review tool, if applicable. No additional management support is needed unless otherwise documented below in the visit note. 

## 2015-11-15 ENCOUNTER — Encounter: Payer: Self-pay | Admitting: Internal Medicine

## 2015-11-16 NOTE — Assessment & Plan Note (Signed)
She refused a flu vaccine today.  The written screening recommendations is given to patient and attached in the patent instructions or AVS.   The patient is here for annual Medicare wellness examination and management of other chronic and acute problems.   The risk factors are reflected in the social history.  The roster of all physicians providing medical care to patient - is listed in the Snapshot section of the chart.  Activities of daily living:  The patient is 100% inedpendent in all ADLs: dressing, toileting, feeding as well as independent mobility  Home safety : The patient has smoke detectors in the home. They wear seatbelts.No firearms at home ( firearms are present in the home, kept in a safe fashion). There is no violence in the home.   There is no risks for hepatitis, STDs or HIV. There is no   history of blood transfusion. They have no travel history to infectious disease endemic areas of the world.  The patient has (has not) seen their dentist in the last six month. They have (not) seen their eye doctor in the last year. They deny (admit to) any hearing difficulty and have not had audiologic testing in the last year.  They do not  have excessive sun exposure. Discussed the need for sun protection: hats, long sleeves and use of sunscreen if there is significant sun exposure.   Diet: the importance of a healthy diet is discussed. They do have a healthy (unhealthy-high fat/fast food) diet.  The patient has a regular exercise program.  The benefits of regular aerobic exercise were discussed.  Depression screen: there are no signs or vegative symptoms of depression- irritability, change in appetite, anhedonia, sadness/tearfullness.  Cognitive assessment: the patient manages all their financial and personal affairs and is actively engaged. They could relate day,date,year and events; recalled 3/3 objects at 3 minutes; performed clock-face test normally.  The following portions of  the patient's history were reviewed and updated as appropriate: allergies, current medications, past family history, past medical history,  past surgical history, past social history  and problem list.  Vision, hearing, body mass index were assessed and reviewed.   During the course of the visit the patient was educated and counseled about appropriate screening and preventive services including : fall prevention , diabetes screening, nutrition counseling, colorectal cancer screening, and recommended immunizations.  

## 2015-11-27 DIAGNOSIS — H25013 Cortical age-related cataract, bilateral: Secondary | ICD-10-CM | POA: Diagnosis not present

## 2015-11-27 DIAGNOSIS — E119 Type 2 diabetes mellitus without complications: Secondary | ICD-10-CM | POA: Diagnosis not present

## 2015-11-27 DIAGNOSIS — H40013 Open angle with borderline findings, low risk, bilateral: Secondary | ICD-10-CM | POA: Diagnosis not present

## 2015-11-27 DIAGNOSIS — H2513 Age-related nuclear cataract, bilateral: Secondary | ICD-10-CM | POA: Diagnosis not present

## 2015-11-27 LAB — HM DIABETES EYE EXAM

## 2015-12-10 ENCOUNTER — Encounter: Payer: Self-pay | Admitting: Internal Medicine

## 2015-12-11 ENCOUNTER — Telehealth: Payer: Self-pay | Admitting: Cardiology

## 2015-12-11 NOTE — Telephone Encounter (Signed)
New Message  Pt call requesting to speak with RN about getting an appt. Pt states she spoke with Dr. Aundra Dubin about being transferred to the CHF clinic. Please call back to discuss

## 2015-12-11 NOTE — Telephone Encounter (Signed)
Pt states she is due for 6 month check up with Dr Aundra Dubin in December, pt not currently having any new symptoms. Pt states she had discussed with Dr Aundra Dubin continuing to see him  in the HF clinic, but her copay there is much higher.  Pt has been scheduled to see Truitt Merle, NP 01/22/16, an afternoon Dr Aundra Dubin is scheduled in the [facility]. Pt advised she can discuss future cardiology follow up at that appointment.

## 2016-01-12 DIAGNOSIS — J0100 Acute maxillary sinusitis, unspecified: Secondary | ICD-10-CM | POA: Diagnosis not present

## 2016-01-21 NOTE — Progress Notes (Signed)
CARDIOLOGY OFFICE NOTE  Date:  01/22/2016    Heather Crawford Date of Birth: 02/08/1945 Medical Record [id number]  PCP:  Scarlette Calico, MD  Cardiologist:  Annabell Howells   Chief Complaint  Patient presents with  . Cardiac Valve Problem  . Coronary Artery Disease    6 month check - see for Dr. Aundra Dubin    History of Present Illness: Heather Crawford is a 70 y.o. female who presents today for a 6 month check. Seen for Dr. Aundra Dubin.   She has a history of rheumatic fever and rheumatic valve disease.  She had an episode as a child that is very consistent with rheumatic fever and has had a heart murmur since that time.  She had been followed by a cardiologist in [facility] in the past for aortic stenosis and mitral regurgitation.   Echo in 1/12 showed moderate aortic stenosis and mild to moderate mitral regurgitation.  EF was mildly decreased (45-50%) with abnormal septal motion consistent with LBBB.   Patient reported dyspnea with moderate exertion such as after walking 100 feet or walking up an incline. She also would get a tightness/pain in her neck when doing the same activities.  No chest pain.  No neck tightness at rest.  We did an adenosine myoview in 2/12 with EF 56% and evidence for ischemia and scar in the mid to apical anterior wall, septal wall, and apex.  Left heart cath was done in 3/12 showing significant RCA disease: 40% ostial, 60% mid, 95% distal.  Dr. Lia Foyer attempted to intervene on the RCA but was unable to adequately seat the catheter for intervention.  Given the large territory supplied by the RCA, CVTS was consulted regarding CABG + AVR given moderate AS.  Patient had AVR with tissue valve and RIMA-RCA in 3/12. Post-op echo in 5/12 showed EF 55-60% and a well-seated bioprosthetic valve.  Most recent echo in 9/17 showed EF 60 to 65% and stable bioprosthetic aortic valve.   Last seen by Dr. Aundra Dubin back in June and she was felt to be doing well.  Comes back today.  Here alone. She notes right off the bat she does not "like doctors". No chest pain. Breathing is ok. She remains active. No regular exercise but gardens/house work, etc. No swelling. No palpitations. She has had a cold/URI/bronchitis/sinusitis for the last 3 weeks. Just finished doxycycline yesterday. Her cough persists. She is not getting good rest at night due to cough. Recent labs noted. She is asking about not having repeat carotid dopplers since her last study was "basically normal".   Past Medical History: 1. 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 HEART VALVE DISEASE: Echo (1/12) with mildly dilated LV, EF 45-50% with paradoxical septal motion consistent with LBBB, moderate aortic stenosis with mean gradient 27 mmHg, trivial AI, mild to moderate mitral regurgitation with calcified mitral valve.  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 Echo (5/12): EF 55-60%, basal inferior hypokinesis, moderate diastolic dysfunction, normally functioning bioprosthetic aortic valve with mean gradient 13 mmHg, normal RV.  Echo (9/14) with EF 60-65%, bioprosthetic aortic valve with mean gradient 16 mmHg, no AI, mild-moderate MR.  Echo (9/16) with EF 55-60%, bioprosthetic aortic valve normal.  4. LBBB 5. ALLERGIC RHINITIS  6. GERD 7. Hx of DIVERTICULITIS 8. Chronic low back pain with history of back surgery 9. Appendectomy 10. Hysterectomy 11. Hyperlipidemia: Myalgias with atorvastatin and Crestor.  12. CAD: Adenosine myoview (2/12) with EF 56%, ischemia  with scar in the mid to apical anterior wall, septal wall, and apex.  LHC (3/12) with EF 55%, 40% ostial RCA, 60% mid RCA, 95% distal RCA.  Intervention attempted but unable to adequately seat catheter.  Patient had CABG-AVR in 3/12 with RIMA-RCA.    13. Carotid dopplers (3/12): no significant stenosis.  Carotid dopplers (9/14) with 40-59% bilateral ICA stenosis. Carotid dopplers (9/15) with 40-59% bilateral ICA stenosis.  Carotid  dopplers (9/16) with 1-39% BICA stenosis, >50% left subclavian stenosis.  14. Vertigo: labyrinthitis       15. Plantar fasciitis   Past Medical History:  Diagnosis Date  . Allergic rhinitis, cause unspecified   . Aortic stenosis    a.  echo 1/12 w/mildly dilated LV, EF 45-50% w/paradoxical septal motion consistent w/ LBBB, moderate aortic stenosis w/mean gradient 27 mmHg, trivial AI, mild to moderate MR w/calcified mitral valve;   b. s/p AVR with 21 mm Magna Ease valve  . Aortic valve replaced 2012  . Arthritis    joint pain  . CAD (coronary artery disease)    a. s/p CABG 4/12: RIMA-RCA  . CAD, multiple vessel    sees Dr Aundra Dubin every 3-4 months  . Cancer ([place])    skin sarcomas removed  . CHF (congestive heart failure) (HCC)    chronic diastolic CHF  . Chronic back pain    H/O BACK SURGERY  . Coronary artery disease    adenosine myoview 2/12 w/EF 56%, ISCHEMIA W/SCAR IN THE MID TO APICAL ANTERIO WALL, SEPTAL WALL, AND APEX. LHC 3/12 W/EF 55%, 40% OSTIAL RCA, 60% MID RCA, 95% DISTAL RC A, INTERVENTION  ATTEMPTED BUT UNABLE TO ADEQUADATELY SEAT  CATHETER.  . Diverticulitis of colon (without mention of hemorrhage)(562.11)   . Diverticulosis   . Esophageal reflux   . Glucose intolerance (impaired glucose tolerance)    A1C 6.3 (4/12)  . H/O hiatal hernia   . H/O vertigo    with fluid  in ears  . Heart murmur   . Hemorrhoids   . Hemorrhoids   . History of rheumatic fever   . Hx of hysterectomy   . Hx: UTI (urinary tract infection)   . Hyperlipidemia   . LBBB (left bundle branch block)   . Myocardial infarction 2002  . Pneumonia   . PONV (postoperative nausea and vomiting)   . 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 Snoring   . Urinary frequency     Past Surgical History:  Procedure Laterality Date  . ABDOMINAL HYSTERECTOMY    . APPENDECTOMY  1973  . BACK SURGERY     Lumbar Lam and discectomy x 2  . BREAST BIOPSY Right 10/2011  . BREAST LUMPECTOMY Right   . CARDIAC  CATHETERIZATION  2012  . CARDIAC VALVE REPLACEMENT  2012   Aortic  . CORONARY ARTERY BYPASS GRAFT  2012  . DILATION AND CURETTAGE OF UTERUS    . ENDOMETRIAL ABLATION    . LAPAROSCOPIC LYSIS INTESTINAL ADHESIONS    . LUMBAR [place]  . SKIN SURGERY     skin sarcomas removed  . TONSILLECTOMY     1955  . TONSILLECTOMY       Medications: Current Outpatient Prescriptions  Medication Sig Dispense Refill  . aspirin 325 MG tablet Take 325 mg by mouth daily.    Marland Kitchen azithromycin (ZITHROMAX) 500 MG tablet TAKE 1 500 MG TABLET 1 HR PRIOR TO DENTAL WORK (Patient not taking:  Reported on 11/14/2015) 1 tablet 0  . carvedilol (COREG) 6.25 MG tablet Take 1 tablet (6.25 mg total) by mouth 2 (two) times daily with a meal. 180 tablet 2  . cetirizine (ZYRTEC) 10 MG tablet Take 10 mg by mouth daily.    . fluticasone (FLONASE) 50 MCG/ACT nasal spray Place 1 spray into both nostrils daily. 29.7 g 3  . furosemide (LASIX) 20 MG tablet TAKE 1 TABLET EVERY DAY  FOR  FLUID  RETENTION 90 tablet 2  . loratadine (CLARITIN) 10 MG tablet Take 10 mg by mouth daily. Reported on 04/25/2015    . meclizine (ANTIVERT) 25 MG tablet Take 1 tablet (25 mg total) by mouth 3 (three) times daily. May take 1/2 tablet if you wish. (Patient not taking: Reported on 11/14/2015) 270 tablet 0  . Omega-3 Fatty Acids (FISH OIL) 1000 MG CAPS Take 2 capsules by mouth 2 (two) times daily.    . potassium chloride (K-DUR,KLOR-CON) 10 MEQ tablet Take 1 tablet (10 mEq total) by mouth daily. 90 tablet 2  . ranitidine (ZANTAC) 150 MG tablet Take 150 mg by mouth daily as needed. For reflux    . simvastatin (ZOCOR) 40 MG tablet Take 1 tablet (40 mg total) by mouth at bedtime. 90 tablet 3   No current facility-administered medications for this visit.     Allergies: Allergies  Allergen Reactions  . Tape Hives    Breakouts from the adhesive in paper tape and patches  . Amoxicillin Itching  . Crestor [Rosuvastatin]  Other (See Comments)    myalgia  . Lipitor [Atorvastatin] Other (See Comments)    myalgia  . Neomycin Itching    Topical neomycin   . Sulfamethoxazole-Trimethoprim Hives    Social History: The patient  reports that she quit smoking about 15 years ago. Her smoking use included Cigarettes. She has a 40.00 pack-year smoking history. She has quit using smokeless tobacco. Her smokeless tobacco use included Chew. She reports that she drinks alcohol. She reports that she does not use drugs.   Family History: The patient's family history includes Breast cancer in her maternal aunt, mother, paternal grandmother, and sister; CAD in her father; Colon cancer in her maternal aunt and paternal uncle; Diabetes type II in her mother; Heart attack in her father; Heart disease in her mother; Hypertension in her mother; Hypothyroidism in her mother; Pancreatic cancer in her other.   Review of Systems: Please see the history of present illness.   Otherwise, the review of systems is positive for none.   All other systems are reviewed and negative.   Physical Exam: VS:  BP 140/70   Pulse 66   Ht 5\' 3"  (1.6 m)   Wt 173 lb 12.8 oz (78.8 kg)   SpO2 96% Comment: at rest  BMI 30.79 kg/m  .  BMI Body mass index is 30.79 kg/m.  Wt Readings from Last 3 Encounters:  01/22/16 173 lb 12.8 oz (78.8 kg)  11/14/15 171 lb 4 oz (77.7 kg)  10/22/15 172 lb (78 kg)    General: Pleasant. She sounds congested.  HEENT: Normal.  Neck: Supple, no JVD, carotid bruits, or masses noted.  Cardiac: Regular rate and rhythm. No murmurs, rubs, or gallops. No edema.  Respiratory:  Lungs are clear to auscultation bilaterally with normal work of breathing.  GI: Soft and nontender.  MS: No deformity or atrophy. Gait and ROM intact.  Skin: Warm and dry. Color is normal.  Neuro:  Strength and sensation are intact and  no gross focal deficits noted.  Psych: Alert, appropriate and with normal affect.   LABORATORY DATA:  EKG:  EKG  is not ordered today.   Lab Results  Component Value Date   WBC 6.4 11/14/2015   HGB 12.7 11/14/2015   HCT 37.5 11/14/2015   PLT 222.0 11/14/2015   GLUCOSE 98 11/14/2015   CHOL 154 11/14/2015   TRIG 209.0 (H) 11/14/2015   HDL 43.90 11/14/2015   LDLDIRECT 87.0 11/14/2015   LDLCALC 60 05/09/2015   ALT 18 11/14/2015   AST 17 11/14/2015   NA 140 11/14/2015   K 4.0 11/14/2015   CL 102 11/14/2015   CREATININE 0.70 11/14/2015   BUN 14 11/14/2015   CO2 30 11/14/2015   TSH 4.84 (H) 11/14/2015   INR 3.1 06/18/2010   HGBA1C 6.8 (H) 11/14/2015   MICROALBUR <0.7 11/14/2015    BNP (last 3 results) No results for input(s): BNP in the last 8760 hours.  ProBNP (last 3 results) No results for input(s): PROBNP in the last 8760 hours.   Other Studies Reviewed Today:  Echo Study Conclusions from 10/2015  - Left ventricle: The cavity size was normal. Wall thickness was   normal. Systolic function was normal. The estimated ejection   fraction was in the range of 60% to 65%. Septal bounce consistent   with prior cardiac surgery. Wall motion was normal; there were no   regional wall motion abnormalities. Doppler parameters are   consistent with abnormal left ventricular relaxation (grade 1   diastolic dysfunction). - Aortic valve: Bioprosthetic aortic valve. No significant stenosis   or regurgitation. Mean gradient (S): 13 mm Hg. - Mitral valve: Moderately calcified annulus. There was mild   regurgitation. - Right ventricle: The cavity size was normal. Systolic function   was normal. - Tricuspid valve: Peak RV-RA gradient (S): 24 mm Hg. - Pulmonary arteries: PA peak pressure: 27 mm Hg (S). - Inferior vena cava: The vessel was normal in size. The   respirophasic diameter changes were in the normal range (= 50%),   consistent with normal central venous pressure.  Impressions:  - Normal LV size with EF 60-65%. Septal bounce consistent with   prior cardiac surgery. Bioprosthetic  aortic valve with no   significant stenosis or regurgitation. Normal RV size and   systolic function.  Assessment/Plan:  1. Aortic valve disorder Patient had moderate AS likely due to rheumatic heart disease. She had AVR with bioprosthetic valve in 3/12 since she was also having CABG. Valve was well-seated on last echo in 9/17.   2. CAD Status post RIMA-RCA in 3/12. No chest pain. She will continue ASA, statin, and Coreg.    3. Diastolic CHF, chronic  NYHA class I/II symptoms, stable.  She is not appear volume overloaded.  Continue current Lasix and KCl. She has gained weight but admits she needs to do better with diet.   4. HYPERLIPIDEMIA Myalgias with atorvastatin and Crestor.  She is on simvastatin.  Good lipids in 10/17.   5. Carotid bruit No significant carotid stenosis in 9/17 - has had some subclavian stenosis - great radial pulse on the left. Continue with statin therapy, diabetic control, etc.   6. URI/cough - will give her some tessalon - she will go back to Urgent Care if fails to improve.   Current medicines are reviewed with the patient today.  The patient does not have concerns regarding medicines other than what has been noted above.  The following changes have been made:  See above.  Labs/ tests ordered today include:   No orders of the defined types were placed in this encounter.    Disposition:   FU with me in 6 months. She would like to see me going forward.    Patient is agreeable to this plan and will call if any problems develop in the interim.   Signed: Burtis Junes, RN, ANP-C 01/22/2016 3:09 PM  [facility] [address] [place], [place]  [address] Phone: [phone] Fax: [phone]

## 2016-01-22 ENCOUNTER — Ambulatory Visit (INDEPENDENT_AMBULATORY_CARE_PROVIDER_SITE_OTHER): Payer: Commercial Managed Care - HMO | Admitting: Nurse Practitioner

## 2016-01-22 ENCOUNTER — Encounter: Payer: Self-pay | Admitting: Nurse Practitioner

## 2016-01-22 VITALS — BP 140/70 | HR 66 | Ht 63.0 in | Wt 173.8 lb

## 2016-01-22 DIAGNOSIS — I5032 Chronic diastolic (congestive) heart failure: Secondary | ICD-10-CM

## 2016-01-22 DIAGNOSIS — Z952 Presence of prosthetic heart valve: Secondary | ICD-10-CM

## 2016-01-22 DIAGNOSIS — I10 Essential (primary) hypertension: Secondary | ICD-10-CM

## 2016-01-22 DIAGNOSIS — I2510 Atherosclerotic heart disease of native coronary artery without angina pectoris: Secondary | ICD-10-CM | POA: Diagnosis not present

## 2016-01-22 MED ORDER — BENZONATATE 100 MG PO CAPS: 100.0000 mg | ORAL_CAPSULE | Freq: Three times a day (TID) | ORAL | 0 refills | Status: DC | PRN

## 2016-01-22 NOTE — Patient Instructions (Signed)
We will be checking the following labs today - NONE   Medication Instructions:    Continue with your current medicines.   I sent in a RX for Tessalon to use as needed for cough    Testing/Procedures To Be Arranged:  N/A  Follow-Up:   See me in 6 months    Other Special Instructions:   N/A    If you need a refill on your cardiac medications before your next appointment, please call your pharmacy.   Call the [place] office at [phone] if you have any questions, problems or concerns.

## 2016-02-12 DIAGNOSIS — Z85828 Personal history of other malignant neoplasm of skin: Secondary | ICD-10-CM | POA: Diagnosis not present

## 2016-02-12 DIAGNOSIS — Z08 Encounter for follow-up examination after completed treatment for malignant neoplasm: Secondary | ICD-10-CM | POA: Diagnosis not present

## 2016-02-12 DIAGNOSIS — L570 Actinic keratosis: Secondary | ICD-10-CM | POA: Diagnosis not present

## 2016-02-15 ENCOUNTER — Other Ambulatory Visit: Payer: Self-pay | Admitting: Cardiology

## 2016-07-01 ENCOUNTER — Encounter: Payer: Self-pay | Admitting: *Deleted

## 2016-07-02 ENCOUNTER — Encounter: Payer: Self-pay | Admitting: Internal Medicine

## 2016-07-02 ENCOUNTER — Other Ambulatory Visit (INDEPENDENT_AMBULATORY_CARE_PROVIDER_SITE_OTHER): Payer: Medicare HMO

## 2016-07-02 ENCOUNTER — Ambulatory Visit (INDEPENDENT_AMBULATORY_CARE_PROVIDER_SITE_OTHER): Payer: Medicare HMO | Admitting: Internal Medicine

## 2016-07-02 VITALS — BP 148/68 | HR 58 | Temp 98.8°F | Resp 16 | Ht 63.0 in | Wt 169.0 lb

## 2016-07-02 DIAGNOSIS — R946 Abnormal results of thyroid function studies: Secondary | ICD-10-CM

## 2016-07-02 DIAGNOSIS — I889 Nonspecific lymphadenitis, unspecified: Secondary | ICD-10-CM | POA: Insufficient documentation

## 2016-07-02 DIAGNOSIS — E118 Type 2 diabetes mellitus with unspecified complications: Secondary | ICD-10-CM | POA: Diagnosis not present

## 2016-07-02 DIAGNOSIS — R7989 Other specified abnormal findings of blood chemistry: Secondary | ICD-10-CM

## 2016-07-02 LAB — COMPREHENSIVE METABOLIC PANEL
ALBUMIN: 4.5 g/dL (ref 3.5–5.2)
ALK PHOS: 54 U/L (ref 39–117)
ALT: 14 U/L (ref 0–35)
AST: 15 U/L (ref 0–37)
BUN: 16 mg/dL (ref 6–23)
CO2: 30 mEq/L (ref 19–32)
CREATININE: 0.74 mg/dL (ref 0.40–1.20)
Calcium: 9.3 mg/dL (ref 8.4–10.5)
Chloride: 103 mEq/L (ref 96–112)
GFR: 81.78 mL/min (ref >60.00)
Glucose, Bld: 120 mg/dL — ABNORMAL HIGH (ref 70–99)
Potassium: 3.9 mEq/L (ref 3.5–5.1)
SODIUM: 140 meq/L (ref 135–145)
Total Bilirubin: 0.3 mg/dL (ref 0.2–1.2)
Total Protein: 7.3 g/dL (ref 6.0–8.3)

## 2016-07-02 LAB — CBC WITH DIFFERENTIAL/PLATELET
BASOS ABS: 0.1 10*3/uL (ref 0.0–0.1)
Basophils Relative: 1.5 % (ref 0.0–3.0)
EOS PCT: 1.6 % (ref 0.0–5.0)
Eosinophils Absolute: 0.1 10*3/uL (ref 0.0–0.7)
HCT: 37.8 % (ref 36.0–46.0)
HEMOGLOBIN: 12.8 g/dL (ref 12.0–15.0)
Lymphocytes Relative: 38.6 % (ref 12.0–46.0)
Lymphs Abs: 2.3 10*3/uL (ref 0.7–4.0)
MCHC: 33.9 g/dL (ref 30.0–36.0)
MCV: 90.2 fl (ref 78.0–100.0)
MONO ABS: 0.4 10*3/uL (ref 0.1–1.0)
Monocytes Relative: 7.5 % (ref 3.0–12.0)
Neutro Abs: 3 10*3/uL (ref 1.4–7.7)
Neutrophils Relative %: 50.8 % (ref 43.0–77.0)
Platelets: 230 10*3/uL (ref 150.0–400.0)
RBC: 4.19 Mil/uL (ref 3.87–5.11)
RDW: 12.6 % (ref 11.5–15.5)
WBC: 5.9 10*3/uL (ref 4.0–10.5)

## 2016-07-02 LAB — C-REACTIVE PROTEIN: CRP: 0.3 mg/dL — AB (ref 0.5–20.0)

## 2016-07-02 LAB — HEMOGLOBIN A1C: HEMOGLOBIN A1C: 7.1 % — AB (ref 4.6–6.5)

## 2016-07-02 NOTE — Progress Notes (Signed)
Subjective:  Patient ID: Heather Crawford, female    DOB: 05-11-1945  Age: 71 y.o. MRN: [id number]  CC: Diabetes   HPI Heather Crawford presents for f/up - She complains of an enlarged and painful lymph node on the left side of her neck. This has been worsening over the last few months. She has also had a few episodes of laryngitis. She complains of chronic diarrhea and night sweats but she denies sore throat, rash, fever, chills, or weight loss.  Outpatient Medications Prior to Visit  Medication Sig Dispense Refill  . aspirin 325 MG tablet Take 325 mg by mouth daily.    . carvedilol (COREG) 6.25 MG tablet Take 1 tablet (6.25 mg total) by mouth 2 (two) times daily with a meal. 180 tablet 2  . cetirizine (ZYRTEC) 10 MG tablet Take 10 mg by mouth daily.    . fluticasone (FLONASE) 50 MCG/ACT nasal spray Place 1 spray into both nostrils daily. 29.7 g 3  . furosemide (LASIX) 20 MG tablet TAKE 1 TABLET EVERY DAY  FOR  FLUID  RETENTION 90 tablet 2  . loratadine (CLARITIN) 10 MG tablet Take 10 mg by mouth daily. Reported on 04/25/2015    . Omega-3 Fatty Acids (FISH OIL) 1000 MG CAPS Take 2 capsules by mouth 2 (two) times daily.    . potassium chloride (K-DUR,KLOR-CON) 10 MEQ tablet Take 1 tablet (10 mEq total) by mouth daily. 90 tablet 2  . ranitidine (ZANTAC) 150 MG tablet Take 150 mg by mouth daily as needed. For reflux    . simvastatin (ZOCOR) 40 MG tablet Take 1 tablet (40 mg total) by mouth at bedtime. 90 tablet 3  . azithromycin (ZITHROMAX) 500 MG tablet TAKE 1 500 MG TABLET 1 HR PRIOR TO DENTAL WORK (Patient not taking: Reported on 11/14/2015) 1 tablet 0  . benzonatate (TESSALON PERLES) 100 MG capsule Take 1 capsule (100 mg total) by mouth 3 (three) times daily as needed for cough. 30 capsule 0  . meclizine (ANTIVERT) 25 MG tablet Take 1 tablet (25 mg total) by mouth 3 (three) times daily. May take 1/2 tablet if you wish. (Patient not taking: Reported on 11/14/2015) 270 tablet 0   No  facility-administered medications prior to visit.     ROS Review of Systems  Constitutional: Negative for appetite change, chills, diaphoresis, fatigue, fever and unexpected weight change.  HENT: Positive for voice change. Negative for mouth sores, sore throat and trouble swallowing.   Eyes: Negative.  Negative for visual disturbance.  Respiratory: Negative for cough, chest tightness, shortness of breath and wheezing.   Cardiovascular: Negative for chest pain, palpitations and leg swelling.  Gastrointestinal: Positive for diarrhea. Negative for abdominal pain, blood in stool, constipation, nausea and vomiting.  Endocrine: Negative.  Negative for cold intolerance, heat intolerance, polydipsia, polyphagia and polyuria.  Genitourinary: Negative.  Negative for difficulty urinating.  Musculoskeletal: Negative.  Negative for back pain and myalgias.  Skin: Negative.  Negative for color change, pallor and rash.  Allergic/Immunologic: Negative.   Neurological: Negative.   Hematological: Positive for adenopathy. Does not bruise/bleed easily.  Psychiatric/Behavioral: Negative.     Objective:  BP (!) 148/68 (BP Location: Left Arm, Patient Position: Sitting, Cuff Size: Normal)   Pulse (!) 58   Temp 98.8 F (37.1 C) (Oral)   Resp 16   Ht 5\' 3"  (1.6 m)   Wt 169 lb (76.7 kg)   SpO2 95%   BMI 29.94 kg/m   BP Readings from Last 3  Encounters:  07/02/16 (!) 148/68  01/22/16 140/70  11/14/15 140/60    Wt Readings from Last 3 Encounters:  07/02/16 169 lb (76.7 kg)  01/22/16 173 lb 12.8 oz (78.8 kg)  11/14/15 171 lb 4 oz (77.7 kg)    Physical Exam  Constitutional: She is oriented to person, place, and time. No distress.  HENT:  Mouth/Throat: Oropharynx is clear and moist. No oropharyngeal exudate.  Eyes: Conjunctivae are normal. Right eye exhibits no discharge. Left eye exhibits no discharge. No scleral icterus.  Neck: Normal range of motion. No tracheal deviation present. No thyroid mass  and no thyromegaly present.  Cardiovascular: Normal rate, regular rhythm, S1 normal, S2 normal and intact distal pulses.  Exam reveals no gallop and no friction rub.   Murmur heard.  Systolic murmur is present with a grade of 2/6   No diastolic murmur is present  Pulmonary/Chest: Effort normal and breath sounds normal. No stridor. No respiratory distress. She has no wheezes. She has no rales. She exhibits no tenderness.  Abdominal: Soft. Bowel sounds are normal. She exhibits no distension and no mass. There is no tenderness. There is no rebound and no guarding.  Musculoskeletal: Normal range of motion. She exhibits no edema.  Lymphadenopathy:       Head (right side): No submental, no submandibular, no tonsillar, no preauricular, no posterior auricular and no occipital adenopathy present.       Head (left side): No submental, no submandibular, no tonsillar, no preauricular, no posterior auricular and no occipital adenopathy present.    She has cervical adenopathy.       Right cervical: Superficial cervical adenopathy present. No deep cervical and no posterior cervical adenopathy present.      Left cervical: Superficial cervical adenopathy present. No deep cervical and no posterior cervical adenopathy present.    She has no axillary adenopathy.       Right: No supraclavicular adenopathy present.       Left: No supraclavicular adenopathy present.  There is remarkable anterior cervical lymphadenopathy, more noticeable on the left than the right. The lymph nodes measure about 3 x 4 cm. They are slightly tender but non-fixed and nonfluctuant.  Neurological: She is oriented to person, place, and time. She has normal reflexes.  Skin: Skin is warm and dry. No rash noted. She is not diaphoretic. No erythema. No pallor.  Vitals reviewed.   Lab Results  Component Value Date   WBC 5.9 07/02/2016   HGB 12.8 07/02/2016   HCT 37.8 07/02/2016   PLT 230.0 07/02/2016   GLUCOSE 120 (H) 07/02/2016   CHOL  154 11/14/2015   TRIG 209.0 (H) 11/14/2015   HDL 43.90 11/14/2015   LDLDIRECT 87.0 11/14/2015   LDLCALC 60 05/09/2015   ALT 14 07/02/2016   AST 15 07/02/2016   NA 140 07/02/2016   K 3.9 07/02/2016   CL 103 07/02/2016   CREATININE 0.74 07/02/2016   BUN 16 07/02/2016   CO2 30 07/02/2016   TSH 3.90 07/02/2016   INR 3.1 06/18/2010   HGBA1C 7.1 (H) 07/02/2016   MICROALBUR <0.7 11/14/2015    No results found.  Assessment & Plan:   [place] was seen today for diabetes.  Diagnoses and all orders for this visit:  Type 2 diabetes mellitus with complication, without long-term current use of insulin ([place])- her A1c is up to 7.1%, will not start a medication at this time but I did encourage her to improve her lifestyle modifications. -     Comprehensive  metabolic panel; Future -     Hemoglobin A1c; Future  Cervical lymphadenitis- her labs are unremarkable, I don't see any evidence of infection at this time but I am concerned about the possibility of lymphoma or some other mass in her neck. I've ordered a CT scan with contrast to gain more information about this. -     CBC with Differential/Platelet; Future -     Comprehensive metabolic panel; Future -     C-reactive protein; Future -     CT Soft Tissue Neck W Contrast; Future  TSH elevation- she is euthyroid, her TFTs are normal now in her testing for thyroid peroxidase antibody is negative. -     Thyroid peroxidase antibody; Future -     Thyroid Panel With TSH; Future   I have discontinued Ms. Delapena's meclizine and benzonatate. I am also having her maintain her ranitidine, aspirin, loratadine, cetirizine, azithromycin, Fish Oil, simvastatin, furosemide, fluticasone, carvedilol, and potassium chloride.  No orders of the defined types were placed in this encounter.    Follow-up: Return in about 3 weeks (around 07/23/2016).  Scarlette Calico, MD

## 2016-07-02 NOTE — Patient Instructions (Signed)
Lymphadenopathy Lymphadenopathy refers to swollen or enlarged lymph glands, also called lymph nodes. Lymph glands are part of your body's defense (immune) system, which protects the body from infections, germs, and diseases. Lymph glands are found in many locations in your body, including the neck, underarm, and groin. Many things can cause lymph glands to become enlarged. When your immune system responds to germs, such as viruses or bacteria, infection-fighting cells and fluid build up. This causes the glands to grow in size. Usually, this is not something to worry about. The swelling and any soreness often go away without treatment. However, swollen lymph glands can also be caused by a number of diseases. Your health care provider may do various tests to help determine the cause. If the cause of your swollen lymph glands cannot be found, it is important to monitor your condition to make sure the swelling goes away. Follow these instructions at home: Watch your condition for any changes. The following actions may help to lessen any discomfort you are feeling:  Get plenty of rest.  Take medicines only as directed by your health care provider. Your health care provider may recommend over-the-counter medicines for pain.  Apply moist heat compresses to the site of swollen lymph nodes as directed by your health care provider. This can help reduce any pain.  Check your lymph nodes daily for any changes.  Keep all follow-up visits as directed by your health care provider. This is important.  Contact a health care provider if:  Your lymph nodes are still swollen after 2 weeks.  Your swelling increases or spreads to other areas.  Your lymph nodes are hard, seem fixed to the skin, or are growing rapidly.  Your skin over the lymph nodes is red and inflamed.  You have a fever.  You have chills.  You have fatigue.  You develop a sore throat.  You have abdominal pain.  You have weight  loss.  You have night sweats. Get help right away if:  You notice fluid leaking from the area of the enlarged lymph node.  You have severe pain in any area of your body.  You have chest pain.  You have shortness of breath. This information is not intended to replace advice given to you by your health care provider. Make sure you discuss any questions you have with your health care provider. Document Released: 10/29/2007 Document Revised: 06/27/2015 Document Reviewed: 08/24/2013 Elsevier Interactive Patient Education  2018 [employer]  

## 2016-07-03 ENCOUNTER — Encounter: Payer: Self-pay | Admitting: Internal Medicine

## 2016-07-03 LAB — THYROID PANEL WITH TSH
Free Thyroxine Index: 1.9 (ref 1.4–3.8)
T3 Uptake: 28 % (ref 22–35)
T4, Total: 6.9 ug/dL (ref 4.5–12.0)
TSH: 3.9 m[IU]/L

## 2016-07-03 LAB — THYROID PEROXIDASE ANTIBODY: THYROID PEROXIDASE ANTIBODY: 1 [IU]/mL (ref <9)

## 2016-07-07 ENCOUNTER — Ambulatory Visit (INDEPENDENT_AMBULATORY_CARE_PROVIDER_SITE_OTHER)
Admission: RE | Admit: 2016-07-07 | Discharge: 2016-07-07 | Disposition: A | Discharge status: Home / Self Care | Payer: Medicare HMO | Source: Ambulatory Visit | Attending: Internal Medicine | Admitting: Internal Medicine

## 2016-07-07 DIAGNOSIS — R221 Localized swelling, mass and lump, neck: Secondary | ICD-10-CM | POA: Diagnosis not present

## 2016-07-07 DIAGNOSIS — I889 Nonspecific lymphadenitis, unspecified: Secondary | ICD-10-CM | POA: Diagnosis not present

## 2016-07-08 ENCOUNTER — Encounter: Payer: Self-pay | Admitting: Internal Medicine

## 2016-07-13 ENCOUNTER — Emergency Department (HOSPITAL_COMMUNITY): Payer: Medicare HMO

## 2016-07-13 ENCOUNTER — Encounter (HOSPITAL_COMMUNITY): Payer: Self-pay | Admitting: Emergency Medicine

## 2016-07-13 DIAGNOSIS — I2510 Atherosclerotic heart disease of native coronary artery without angina pectoris: Secondary | ICD-10-CM | POA: Diagnosis not present

## 2016-07-13 DIAGNOSIS — S82491A Other fracture of shaft of right fibula, initial encounter for closed fracture: Secondary | ICD-10-CM | POA: Diagnosis not present

## 2016-07-13 DIAGNOSIS — Y999 Unspecified external cause status: Secondary | ICD-10-CM | POA: Insufficient documentation

## 2016-07-13 DIAGNOSIS — S8261XA Displaced fracture of lateral malleolus of right fibula, initial encounter for closed fracture: Secondary | ICD-10-CM | POA: Diagnosis not present

## 2016-07-13 DIAGNOSIS — E1169 Type 2 diabetes mellitus with other specified complication: Secondary | ICD-10-CM | POA: Insufficient documentation

## 2016-07-13 DIAGNOSIS — S79911A Unspecified injury of right hip, initial encounter: Secondary | ICD-10-CM | POA: Diagnosis not present

## 2016-07-13 DIAGNOSIS — Y92000 Kitchen of unspecified non-institutional (private) residence as  the place of occurrence of the external cause: Secondary | ICD-10-CM | POA: Diagnosis not present

## 2016-07-13 DIAGNOSIS — W1839XA Other fall on same level, initial encounter: Secondary | ICD-10-CM | POA: Diagnosis not present

## 2016-07-13 DIAGNOSIS — Z951 Presence of aortocoronary bypass graft: Secondary | ICD-10-CM | POA: Diagnosis not present

## 2016-07-13 DIAGNOSIS — S82832A Other fracture of upper and lower end of left fibula, initial encounter for closed fracture: Secondary | ICD-10-CM | POA: Diagnosis not present

## 2016-07-13 DIAGNOSIS — Z79899 Other long term (current) drug therapy: Secondary | ICD-10-CM | POA: Diagnosis not present

## 2016-07-13 DIAGNOSIS — R55 Syncope and collapse: Secondary | ICD-10-CM | POA: Diagnosis not present

## 2016-07-13 DIAGNOSIS — I110 Hypertensive heart disease with heart failure: Secondary | ICD-10-CM | POA: Insufficient documentation

## 2016-07-13 DIAGNOSIS — S99911A Unspecified injury of right ankle, initial encounter: Secondary | ICD-10-CM | POA: Diagnosis present

## 2016-07-13 DIAGNOSIS — Z85828 Personal history of other malignant neoplasm of skin: Secondary | ICD-10-CM | POA: Diagnosis not present

## 2016-07-13 DIAGNOSIS — Z7982 Long term (current) use of aspirin: Secondary | ICD-10-CM | POA: Insufficient documentation

## 2016-07-13 DIAGNOSIS — Y93G3 Activity, cooking and baking: Secondary | ICD-10-CM | POA: Diagnosis not present

## 2016-07-13 DIAGNOSIS — I5032 Chronic diastolic (congestive) heart failure: Secondary | ICD-10-CM | POA: Insufficient documentation

## 2016-07-13 DIAGNOSIS — S8991XA Unspecified injury of right lower leg, initial encounter: Secondary | ICD-10-CM | POA: Diagnosis not present

## 2016-07-13 DIAGNOSIS — I252 Old myocardial infarction: Secondary | ICD-10-CM | POA: Diagnosis not present

## 2016-07-13 DIAGNOSIS — Z87891 Personal history of nicotine dependence: Secondary | ICD-10-CM | POA: Insufficient documentation

## 2016-07-13 DIAGNOSIS — R51 Headache: Secondary | ICD-10-CM | POA: Diagnosis not present

## 2016-07-13 DIAGNOSIS — M79604 Pain in right leg: Secondary | ICD-10-CM | POA: Diagnosis not present

## 2016-07-13 DIAGNOSIS — S0990XA Unspecified injury of head, initial encounter: Secondary | ICD-10-CM | POA: Diagnosis not present

## 2016-07-13 DIAGNOSIS — S199XXA Unspecified injury of neck, initial encounter: Secondary | ICD-10-CM | POA: Diagnosis not present

## 2016-07-13 LAB — URINALYSIS, ROUTINE W REFLEX MICROSCOPIC
BACTERIA UA: NONE SEEN
Bilirubin Urine: NEGATIVE
Glucose, UA: NEGATIVE mg/dL
Hgb urine dipstick: NEGATIVE
Ketones, ur: NEGATIVE mg/dL
Nitrite: NEGATIVE
Protein, ur: NEGATIVE mg/dL
SQUAMOUS EPITHELIAL / LPF: NONE SEEN
Specific Gravity, Urine: 1.013 (ref 1.005–1.030)
pH: 6 (ref 5.0–8.0)

## 2016-07-13 LAB — BASIC METABOLIC PANEL
ANION GAP: 9 (ref 5–15)
BUN: 18 mg/dL (ref 6–20)
CHLORIDE: 100 mmol/L — AB (ref 101–111)
CO2: 27 mmol/L (ref 22–32)
CREATININE: 0.85 mg/dL (ref 0.44–1.00)
Calcium: 9.2 mg/dL (ref 8.9–10.3)
GFR calc non Af Amer: >60 mL/min (ref >60)
GLUCOSE: 133 mg/dL — AB (ref 65–99)
Potassium: 4.2 mmol/L (ref 3.5–5.1)
Sodium: 136 mmol/L (ref 135–145)

## 2016-07-13 LAB — CBC
HCT: 37.5 % (ref 36.0–46.0)
HEMOGLOBIN: 12.1 g/dL (ref 12.0–15.0)
MCH: 30 pg (ref 26.0–34.0)
MCHC: 32.3 g/dL (ref 30.0–36.0)
MCV: 92.8 fL (ref 78.0–100.0)
Platelets: 223 10*3/uL (ref 150–400)
RBC: 4.04 MIL/uL (ref 3.87–5.11)
RDW: 12.9 % (ref 11.5–15.5)
WBC: 7.2 10*3/uL (ref 4.0–10.5)

## 2016-07-13 NOTE — ED Triage Notes (Signed)
Pt presents to ED from [facility] after cooking all day, beginning to feel fatigued, developing a migraine aura with dizziness, light-headedness, eye pain, tunnel vision, and then developed a headache.  Pt then felt faint and yelled for her husband and stated she was going to pass out.  Patient fell upright on to the floor, denies head injury, but has a swollen right ankle with bruising noted, and an abrasion to the right knee.

## 2016-07-14 ENCOUNTER — Emergency Department (HOSPITAL_COMMUNITY)
Admission: EM | Admit: 2016-07-14 | Discharge: 2016-07-14 | Disposition: A | Discharge status: Home / Self Care | Payer: Medicare HMO | Attending: Emergency Medicine | Admitting: Emergency Medicine

## 2016-07-14 ENCOUNTER — Encounter (HOSPITAL_COMMUNITY): Payer: Self-pay | Admitting: Emergency Medicine

## 2016-07-14 ENCOUNTER — Emergency Department (HOSPITAL_COMMUNITY): Payer: Medicare HMO

## 2016-07-14 DIAGNOSIS — S82891A Other fracture of right lower leg, initial encounter for closed fracture: Secondary | ICD-10-CM

## 2016-07-14 DIAGNOSIS — R55 Syncope and collapse: Secondary | ICD-10-CM

## 2016-07-14 DIAGNOSIS — S8991XA Unspecified injury of right lower leg, initial encounter: Secondary | ICD-10-CM | POA: Diagnosis not present

## 2016-07-14 DIAGNOSIS — S79911A Unspecified injury of right hip, initial encounter: Secondary | ICD-10-CM | POA: Diagnosis not present

## 2016-07-14 DIAGNOSIS — S0990XA Unspecified injury of head, initial encounter: Secondary | ICD-10-CM | POA: Diagnosis not present

## 2016-07-14 DIAGNOSIS — S199XXA Unspecified injury of neck, initial encounter: Secondary | ICD-10-CM | POA: Diagnosis not present

## 2016-07-14 DIAGNOSIS — R51 Headache: Secondary | ICD-10-CM | POA: Diagnosis not present

## 2016-07-14 DIAGNOSIS — M79604 Pain in right leg: Secondary | ICD-10-CM | POA: Diagnosis not present

## 2016-07-14 LAB — I-STAT TROPONIN, ED: TROPONIN I, POC: 0 ng/mL (ref 0.00–0.08)

## 2016-07-14 MED ORDER — ACETAMINOPHEN 325 MG PO TABS
650.0000 mg | ORAL_TABLET | Freq: Once | ORAL | Status: AC
  Administered 2016-07-14: 650 mg via ORAL
  Filled 2016-07-14: qty 2

## 2016-07-14 MED ORDER — TRAMADOL HCL 50 MG PO TABS: 50.0000 mg | ORAL_TABLET | Freq: Two times a day (BID) | ORAL | 0 refills | Status: DC | PRN

## 2016-07-14 MED ORDER — CARVEDILOL 12.5 MG PO TABS
6.2500 mg | ORAL_TABLET | Freq: Once | ORAL | Status: AC
  Administered 2016-07-14: 6.25 mg via ORAL
  Filled 2016-07-14: qty 1

## 2016-07-14 NOTE — Progress Notes (Signed)
Orthopedic Tech Progress Note Patient Details:  Heather Crawford May 18, 1945 [id number]  Ortho Devices Type of Ortho Device: Post (short leg) splint, Stirrup splint Ortho Device/Splint Location: rle Ortho Device/Splint Interventions: Ordered, Application   Karolee Stamps 07/14/2016, 2:46 AM

## 2016-07-14 NOTE — ED Provider Notes (Signed)
[facility] DEPT Provider Note   CSN: [id number] Arrival date & time: 07/13/16  2005     History   Chief Complaint Chief Complaint  Patient presents with  . Loss of Consciousness  . Ankle Pain    HPI Heather Crawford is a 71 y.o. female.  HPI    Heather Crawford is a 71 y.o. female, with a history of CAD, Aortic valve replacement, MI, and LBBB, presenting to the ED with near syncope.  Patient states she was outside cleaning the porch and "it was very hot outside." She then was inside cooking for the rest of the afternoon and evening. About 4:30pm she began to have a headache consistent with her migraines. She endorses pain around the eyes, nausea, and "tunnel vision" for about 10-15 minutes before she began to feel lightheaded and felt like she was going to pass out. Patient sat down on the kitchen floor. Patient landed with her right ankle under her. Current headache is around her eyes bilaterally, 5/10, aching/throbbing, nonradiating. Right ankle pain is centered around the outside of the ankle, throbbing, severe, radiating into the right foot. She has not taken her evening dose of carvedilol. Patient took her blood pressure at home after her near syncopal episode and found it to be 140/65 with a pulse of 62.  Denies LOC, CP, SOB, head injury, neck/back pain, current visual disturbances, vomiting, diarrhea, abdominal pain, or any other complaints.    Past Medical History:  Diagnosis Date  . Allergic rhinitis, cause unspecified   . Aortic stenosis    a.  echo 1/12 w/mildly dilated LV, EF 45-50% w/paradoxical septal motion consistent w/ LBBB, moderate aortic stenosis w/mean gradient 27 mmHg, trivial AI, mild to moderate MR w/calcified mitral valve;   b. s/p AVR with 21 mm Magna Ease valve  . Aortic valve replaced 2012  . Arthritis    joint pain  . CAD (coronary artery disease)    a. s/p CABG 4/12: RIMA-RCA  . CAD, multiple vessel    sees Dr Aundra Dubin every 3-4 months  .  Cancer ([place])    skin sarcomas removed  . CHF (congestive heart failure) (HCC)    chronic diastolic CHF  . Chronic back pain    H/O BACK SURGERY  . Coronary artery disease    adenosine myoview 2/12 w/EF 56%, ISCHEMIA W/SCAR IN THE MID TO APICAL ANTERIO WALL, SEPTAL WALL, AND APEX. LHC 3/12 W/EF 55%, 40% OSTIAL RCA, 60% MID RCA, 95% DISTAL RC A, INTERVENTION  ATTEMPTED BUT UNABLE TO ADEQUADATELY SEAT  CATHETER.  . Diverticulitis of colon (without mention of hemorrhage)(562.11)   . Diverticulosis   . Esophageal reflux   . Glucose intolerance (impaired glucose tolerance)    A1C 6.3 (4/12)  . H/O hiatal hernia   . H/O vertigo    with fluid  in ears  . Heart murmur   . Hemorrhoids   . Hemorrhoids   . History of rheumatic fever   . Hx of hysterectomy   . Hx: UTI (urinary tract infection)   . Hyperlipidemia   . LBBB (left bundle branch block)   . Myocardial infarction ([place]) 2002  . Pneumonia   . PONV (postoperative nausea and vomiting)   . 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 Snoring   . Urinary frequency     Patient Active Problem List   Diagnosis Date Noted  . Cervical lymphadenitis 07/02/2016  . TSH elevation 07/02/2016  . Colon cancer screening 10/17/2014  .  Hypertriglyceridemia 08/16/2013  . Osteopenia 08/16/2013  . Type II diabetes mellitus with manifestations ([place]) 08/16/2013  . Bilateral carotid artery disease ([place]) 08/15/2012  . Atypical lobular hyperplasia of breast 10/08/2011  . Routine health maintenance 08/13/2011  . Diastolic CHF, chronic ([place]) 10/20/2010  . CAD, NATIVE VESSEL 04/18/2010  . Essential hypertension 03/26/2010  . Hyperlipidemia with target LDL less than 70 02/20/2010  . HEART VALVE DISEASE 02/07/2010  . ALLERGIC RHINITIS 02/07/2010  . GERD 02/07/2010    Past Surgical History:  Procedure Laterality Date  . ABDOMINAL HYSTERECTOMY    . APPENDECTOMY  1973  . BACK SURGERY     Lumbar Lam and discectomy x 2  . BREAST BIOPSY Right 10/2011  . BREAST  LUMPECTOMY Right   . CARDIAC CATHETERIZATION  2012  . CARDIAC VALVE REPLACEMENT  2012   Aortic  . CORONARY ARTERY BYPASS GRAFT  2012  . DILATION AND CURETTAGE OF UTERUS    . ENDOMETRIAL ABLATION    . LAPAROSCOPIC LYSIS INTESTINAL ADHESIONS    . LUMBAR [place]  . SKIN SURGERY     skin sarcomas removed  . TONSILLECTOMY     1955  . TONSILLECTOMY      OB History    No data available       Home Medications    Prior to Admission medications   Medication Sig Start Date End Date Taking? Authorizing Provider  aspirin 325 MG tablet Take 325 mg by mouth daily.    [provider]  azithromycin (ZITHROMAX) 500 MG tablet TAKE 1 500 MG TABLET 1 HR PRIOR TO DENTAL WORK Patient not taking: Reported on 11/14/2015 06/19/13   Larey Dresser, MD  carvedilol (COREG) 6.25 MG tablet Take 1 tablet (6.25 mg total) by mouth 2 (two) times daily with a meal. 02/17/16   Larey Dresser, MD  cetirizine (ZYRTEC) 10 MG tablet Take 10 mg by mouth daily.    [provider]  fluticasone (FLONASE) 50 MCG/ACT nasal spray Place 1 spray into both nostrils daily. 10/22/15   Janith Lima, MD  furosemide (LASIX) 20 MG tablet TAKE 1 TABLET EVERY DAY  FOR  FLUID  RETENTION 10/16/15   Larey Dresser, MD  loratadine (CLARITIN) 10 MG tablet Take 10 mg by mouth daily. Reported on 04/25/2015    [provider]  Omega-3 Fatty Acids (FISH OIL) 1000 MG CAPS Take 2 capsules by mouth 2 (two) times daily.    [provider]  potassium chloride (K-DUR,KLOR-CON) 10 MEQ tablet Take 1 tablet (10 mEq total) by mouth daily. 02/17/16   Larey Dresser, MD  ranitidine (ZANTAC) 150 MG tablet Take 150 mg by mouth daily as needed. For reflux    [provider]  simvastatin (ZOCOR) 40 MG tablet Take 1 tablet (40 mg total) by mouth at bedtime. 09/17/15   Larey Dresser, MD  traMADol (ULTRAM) 50 MG tablet Take 1 tablet (50 mg total) by mouth every 12 (twelve) hours  as needed for severe pain. 07/14/16   Shjon Lizarraga, Helane Gunther, PA-C    Family History Family History  Problem Relation Age of Onset  . Hypertension Mother   . Breast cancer Mother   . Hypothyroidism Mother   . Diabetes type II Mother   . Heart disease Mother   . Heart attack Father        CABG @ 96  . CAD Father   . Breast cancer Sister  x 2  . Colon cancer Maternal Aunt   . Breast cancer Maternal Aunt   . Colon cancer Paternal Uncle   . Pancreatic cancer Other        PGA  . Breast cancer Paternal Grandmother     Social History Social History  Substance Use Topics  . Smoking status: Former Smoker    Packs/day: 1.00    Years: 40.00    Types: Cigarettes    Quit date: 02/03/2000  . Smokeless tobacco: Former Systems developer    Types: Chew     Comment: discussed LDCT   . Alcohol use 0.0 oz/week     Allergies   Tape; Amoxicillin; Crestor [rosuvastatin]; Lipitor [atorvastatin]; Neomycin; and Sulfamethoxazole-trimethoprim   Review of Systems Review of Systems  Constitutional: Negative for chills, diaphoresis and fever.  Respiratory: Negative for shortness of breath.   Cardiovascular: Negative for chest pain.  Gastrointestinal: Positive for nausea (resolved). Negative for abdominal pain, diarrhea and vomiting.  Musculoskeletal: Positive for arthralgias and joint swelling.  Skin: Negative for wound.  Neurological: Positive for syncope (near syncope), light-headedness (resolved) and headaches. Negative for weakness and numbness.  All other systems reviewed and are negative.    Physical Exam Updated Vital Signs BP (!) 198/55 (BP Location: Left Arm)   Pulse (!) 57   Temp 98.2 F (36.8 C) (Oral)   Resp 18   SpO2 97%   Physical Exam  Constitutional: She appears well-developed and well-nourished. No distress.  HENT:  Head: Normocephalic and atraumatic.  Eyes: Conjunctivae and EOM are normal. Pupils are equal, round, and reactive to light.  Neck: Normal range of motion. Neck supple.   Cardiovascular: Normal rate, regular rhythm, normal heart sounds and intact distal pulses.   Pulmonary/Chest: Effort normal and breath sounds normal. No respiratory distress.  Abdominal: Soft. There is no tenderness. There is no guarding.  Musculoskeletal: She exhibits edema and tenderness. She exhibits no deformity.  Tenderness and swelling over right lateral malleolus. Motor function intact in right toes.   Full range of motion in the right knee without difficulty. Patient voices some minor tenderness in the area of anterior abrasions. No noted swelling, deformity, crepitus, or laxity.  Full range of motion without pain in the right hip. Pelvis appears to be stable.  Normal motor function intact in all other extremities and spine. No midline spinal tenderness.   Lymphadenopathy:    She has no cervical adenopathy.  Neurological: She is alert.  No sensory deficits. Strength 5/5 in all extremities. No upright ataxia. Coordination intact including heel to shin and finger to nose. Cranial nerves III-XII grossly intact. No facial droop.   Skin: Skin is warm and dry. Capillary refill takes less than 2 seconds. She is not diaphoretic.  Psychiatric: She has a normal mood and affect. Her behavior is normal.  Nursing note and vitals reviewed.    ED Treatments / Results  Labs (all labs ordered are listed, but only abnormal results are displayed) Labs Reviewed  BASIC METABOLIC PANEL - Abnormal; Notable for the following:       Result Value   Chloride 100 (*)    Glucose, Bld 133 (*)    All other components within normal limits  URINALYSIS, ROUTINE W REFLEX MICROSCOPIC - Abnormal; Notable for the following:    Color, Urine STRAW (*)    Leukocytes, UA TRACE (*)    All other components within normal limits  CBC  CBG MONITORING, ED  I-STAT TROPOININ, ED    EKG  EKG  Interpretation  Date/Time:  Monday July 13 2016 20:19:20 EDT Ventricular Rate:  58 PR Interval:  170 QRS Duration: 138 QT  Interval:  486 QTC Calculation: 477 R Axis:   76 Text Interpretation:  Sinus bradycardia with sinus arrhythmia Left bundle branch block Abnormal ECG Confirmed by Tanna Furry [phone]) on 07/13/2016 8:26:08 PM Also confirmed by Thayer Jew [phone])  on 07/14/2016 1:10:04 AM       Radiology Dg Ankle Complete Right  Result Date: 07/13/2016 CLINICAL DATA:  Fall onto right side, pain with weight-bearing EXAM: RIGHT ANKLE - COMPLETE 3+ VIEW COMPARISON:  09/06/2013 FINDINGS: Acute fracture distal fibular diaphysis with slightly less than 1/4 shaft diameter of lateral displacement of distal fracture fragment. Ankle mortise is symmetric. Possible tiny cortical avulsion medial malleolus. Moderate plantar spur. IMPRESSION: 1. Acute mildly displaced distal fibular fracture 2. Questionable tiny avulsion off the tip of medial malleolus Electronically Signed   By: Donavan Foil M.D.   On: 07/13/2016 21:17   Ct Head Wo Contrast  Result Date: 07/14/2016 CLINICAL DATA:  Acute onset of migraine headache, with dizziness and near syncope. Status post fall. Concern for cervical spine injury. Initial encounter. EXAM: CT HEAD WITHOUT CONTRAST CT CERVICAL SPINE WITHOUT CONTRAST TECHNIQUE: Multidetector CT imaging of the head and cervical spine was performed following the standard protocol without intravenous contrast. Multiplanar CT image reconstructions of the cervical spine were also generated. COMPARISON:  CT of the neck performed 07/07/2016 FINDINGS: CT HEAD FINDINGS Brain: No evidence of acute infarction, hemorrhage, hydrocephalus, extra-axial collection or mass lesion/mass effect. Prominence of the sulci suggests mild cortical volume loss. Mild cerebellar atrophy is noted. The brainstem and fourth ventricle are within normal limits. The basal ganglia are unremarkable in appearance. The cerebral hemispheres demonstrate grossly normal gray-white differentiation. No mass effect or midline shift is seen. Vascular: No  hyperdense vessel or unexpected calcification. Skull: There is no evidence of fracture; visualized osseous structures are unremarkable in appearance. Sinuses/Orbits: The orbits are within normal limits. There is partial opacification of the mastoid air cells bilaterally. The paranasal sinuses are well-aerated. Other: No significant soft tissue abnormalities are seen. CT CERVICAL SPINE FINDINGS Alignment: Normal. Skull base and vertebrae: No acute fracture. No primary bone lesion or focal pathologic process. Soft tissues and spinal canal: No prevertebral fluid or swelling. No visible canal hematoma. Disc levels: Endplate degenerative change is noted at C5-C6, with associated anterior and posterior disc osteophyte complexes. Upper chest: Mild emphysema is noted at the lung apices. The thyroid gland is grossly unremarkable. Scattered calcification is seen along the proximal great vessels. Mild calcification is seen at the carotid bifurcations bilaterally. Other: No additional soft tissue abnormalities are seen. IMPRESSION: 1. No evidence of traumatic intracranial injury or fracture. 2. No evidence of fracture or subluxation along the cervical spine. 3. Mild cortical volume loss noted. 4. Mild degenerative change at the lower cervical spine. 5. Partial opacification of the mastoid air cells bilaterally. 6. Scattered calcification along the proximal great vessels. Mild calcification at the carotid bifurcations bilaterally. Mild to moderate luminal narrowing was noted at the proximal left subclavian artery and proximal left common carotid artery on recent CT of the neck. Electronically Signed   By: Garald Balding M.D.   On: 07/14/2016 02:28   Ct Cervical Spine Wo Contrast  Result Date: 07/14/2016 CLINICAL DATA:  Acute onset of migraine headache, with dizziness and near syncope. Status post fall. Concern for cervical spine injury. Initial encounter. EXAM: CT HEAD WITHOUT CONTRAST CT CERVICAL SPINE  WITHOUT CONTRAST  TECHNIQUE: Multidetector CT imaging of the head and cervical spine was performed following the standard protocol without intravenous contrast. Multiplanar CT image reconstructions of the cervical spine were also generated. COMPARISON:  CT of the neck performed 07/07/2016 FINDINGS: CT HEAD FINDINGS Brain: No evidence of acute infarction, hemorrhage, hydrocephalus, extra-axial collection or mass lesion/mass effect. Prominence of the sulci suggests mild cortical volume loss. Mild cerebellar atrophy is noted. The brainstem and fourth ventricle are within normal limits. The basal ganglia are unremarkable in appearance. The cerebral hemispheres demonstrate grossly normal gray-white differentiation. No mass effect or midline shift is seen. Vascular: No hyperdense vessel or unexpected calcification. Skull: There is no evidence of fracture; visualized osseous structures are unremarkable in appearance. Sinuses/Orbits: The orbits are within normal limits. There is partial opacification of the mastoid air cells bilaterally. The paranasal sinuses are well-aerated. Other: No significant soft tissue abnormalities are seen. CT CERVICAL SPINE FINDINGS Alignment: Normal. Skull base and vertebrae: No acute fracture. No primary bone lesion or focal pathologic process. Soft tissues and spinal canal: No prevertebral fluid or swelling. No visible canal hematoma. Disc levels: Endplate degenerative change is noted at C5-C6, with associated anterior and posterior disc osteophyte complexes. Upper chest: Mild emphysema is noted at the lung apices. The thyroid gland is grossly unremarkable. Scattered calcification is seen along the proximal great vessels. Mild calcification is seen at the carotid bifurcations bilaterally. Other: No additional soft tissue abnormalities are seen. IMPRESSION: 1. No evidence of traumatic intracranial injury or fracture. 2. No evidence of fracture or subluxation along the cervical spine. 3. Mild cortical volume loss  noted. 4. Mild degenerative change at the lower cervical spine. 5. Partial opacification of the mastoid air cells bilaterally. 6. Scattered calcification along the proximal great vessels. Mild calcification at the carotid bifurcations bilaterally. Mild to moderate luminal narrowing was noted at the proximal left subclavian artery and proximal left common carotid artery on recent CT of the neck. Electronically Signed   By: Garald Balding M.D.   On: 07/14/2016 02:28   Dg Knee Complete 4 Views Right  Result Date: 07/14/2016 CLINICAL DATA:  Status post fall, with right upper leg tenderness and pain. Initial encounter. EXAM: RIGHT KNEE - COMPLETE 4+ VIEW COMPARISON:  None. FINDINGS: There is no evidence of fracture or dislocation. The joint spaces are preserved. No significant degenerative change is seen; the patellofemoral joint is grossly unremarkable in appearance. No significant joint effusion is seen. Scattered vascular calcifications are seen. IMPRESSION: 1. No evidence of fracture or dislocation. 2. Scattered vascular calcifications noted. Electronically Signed   By: Garald Balding M.D.   On: 07/14/2016 04:34   Dg Hip Unilat W Or Wo Pelvis 2-3 Views Right  Result Date: 07/14/2016 CLINICAL DATA:  Status post fall, with right upper leg pain and tenderness. Initial encounter. EXAM: DG HIP (WITH OR WITHOUT PELVIS) 2-3V RIGHT COMPARISON:  None. FINDINGS: There is no evidence of fracture or dislocation. Both femoral heads are seated normally within their respective acetabula. The proximal right femur appears intact. No significant degenerative change is appreciated. The sacroiliac joints are unremarkable in appearance. The visualized bowel gas pattern is grossly unremarkable in appearance. IMPRESSION: No evidence of fracture or dislocation. Electronically Signed   By: Garald Balding M.D.   On: 07/14/2016 04:35    Procedures Procedures (including critical care time)  Medications Ordered in ED Medications    carvedilol (COREG) tablet 6.25 mg (6.25 mg Oral Given 07/14/16 0115)  acetaminophen (TYLENOL) tablet 650 mg (  650 mg Oral Given 07/14/16 0207)     Initial Impression / Assessment and Plan / ED Course  I have reviewed the triage vital signs and the nursing notes.  Pertinent labs & imaging results that were available during my care of the patient were reviewed by me and considered in my medical decision making (see chart for details).  Clinical Course as of Jul 14 624  Tue Jul 14, 2016  0215 Patient states her headache has improved. Also voices improvement with lower extremity pain. Denies additional complaints.   [SJ]  0300 Patient reexamined after ankle splint placed prior to discharge. Patient now voices intense right knee pain and swelling. Pain with ROM in right knee. Tenderness and swelling over medial right knee. Headache has resolved.  [SJ]    Clinical Course User Index [SJ] Dequandre Cordova C, PA-C    Patient presents following a near syncopal episode. Fibular fracture and questionable medial malleolus avulsion fracture. Headache resolved with minimal intervention. CT free from acute abnormality. Orthopedic follow-up, patient requests Dr. Wynelle Link. Patient states she has a motorized scooter and manual wheelchair at home that will assist her with mobility. The patient was given instructions for home care as well as return precautions. Patient voices understanding of these instructions, accepts the plan, and is comfortable with discharge.  Findings and plan of care discussed with Thayer Jew, MD. Dr. Dina Rich personally evaluated and examined this patient.  Vitals:   07/14/16 0400 07/14/16 0430 07/14/16 0500 07/14/16 0530  BP: (!) 143/54 (!) 144/53 (!) 148/51 (!) 145/55  Pulse: (!) 55 61 (!) 59 (!) 59  Resp: 13 14 14 12   Temp:      TempSrc:      SpO2: 97% 95% 94% 95%     Final Clinical Impressions(s) / ED Diagnoses   Final diagnoses:  Near syncope  Closed fracture of right  ankle, initial encounter    New Prescriptions Discharge Medication List as of 07/14/2016  5:16 AM    START taking these medications   Details  traMADol (ULTRAM) 50 MG tablet Take 1 tablet (50 mg total) by mouth every 12 (twelve) hours as needed for severe pain., Starting Tue 07/14/2016, Print         Lajuan Kovaleski, [place] C, PA-C 07/14/16 0370    Merryl Hacker, MD 07/15/16 Dyann Kief

## 2016-07-14 NOTE — Discharge Instructions (Signed)
You have been seen today for an ankle injury. There was a fracture noted to the right ankle.  Pain: If you are able to take medications like ibuprofen, these are the best to reduce inflammation. Otherwise may take Tylenol as needed for pain.  Tramadol for severe pain. Use caution as tramadol can cause drowsiness or increase the chance of falls. Ice: May apply ice to the area over the next 24 hours for 15 minutes at a time to reduce swelling. Elevation: Keep the extremity elevated as often as possible to reduce pain and inflammation. Support: Keep the splint clean and dry. Follow up: Follow up with orthopedic surgeon as soon as possible. Call the number provided to set up an appointment.  You may also follow up with the Ear, Nose, and Throat specialist for your recurrent sinus trouble.

## 2016-07-16 DIAGNOSIS — S82831A Other fracture of upper and lower end of right fibula, initial encounter for closed fracture: Secondary | ICD-10-CM | POA: Diagnosis not present

## 2016-07-21 ENCOUNTER — Ambulatory Visit (INDEPENDENT_AMBULATORY_CARE_PROVIDER_SITE_OTHER): Payer: Medicare HMO

## 2016-07-21 ENCOUNTER — Ambulatory Visit (INDEPENDENT_AMBULATORY_CARE_PROVIDER_SITE_OTHER): Payer: Medicare HMO | Admitting: Nurse Practitioner

## 2016-07-21 ENCOUNTER — Encounter: Payer: Self-pay | Admitting: Nurse Practitioner

## 2016-07-21 VITALS — BP 130/52 | HR 68 | Ht 63.0 in

## 2016-07-21 DIAGNOSIS — R55 Syncope and collapse: Secondary | ICD-10-CM | POA: Diagnosis not present

## 2016-07-21 MED ORDER — ASPIRIN 325 MG PO TABS: 325.0000 mg | ORAL_TABLET | Freq: Two times a day (BID) | ORAL | Status: DC

## 2016-07-21 MED ORDER — ASPIRIN EC 81 MG PO TBEC: 81.0000 mg | DELAYED_RELEASE_TABLET | Freq: Every day | ORAL | Status: DC

## 2016-07-21 NOTE — Progress Notes (Signed)
CARDIOLOGY OFFICE NOTE  Date:  07/21/2016    Heather Crawford Date of Birth: [birth year] Medical Record #[id number]  PCP:  Janith Lima, MD  Cardiologist:  Annabell Howells   Chief Complaint  Patient presents with  . Cardiac Valve Problem    6 month check - seen for Dr. Aundra Dubin    History of Present Illness: Heather Crawford is a 71 y.o. female who presents today for a 6 month check. Seen for Dr. Aundra Dubin.   She has a history of rheumatic fever and rheumatic valve disease.  She had an episode as a child that is very consistent with rheumatic fever and has had a heart murmur since that time. She had been followed by a cardiologist in [facility] in the past for aortic stenosis and mitral regurgitation. Echo in 1/12 showed moderate aortic stenosis and mild to moderate mitral regurgitation. EF was mildly decreased (45-50%) with abnormal septal motion consistent with LBBB. Patient reported dyspnea with moderate exertion such as after walking 100 feet or walking up an incline. She also would get a tightness/pain in her neck when doing the same activities. No chest pain. No neck tightness at rest. We did an adenosine myoview in 2/12 with EF 56% and evidence for ischemia and scar in the mid to apical anterior wall, septal wall, and apex. Left heart cath was done in 3/12 showing significant RCA disease: 40% ostial, 60% mid, 95% distal. Dr. Lia Foyer attempted to intervene on the RCA but was unable to adequately seat the catheter for intervention. Given the large territory supplied by the RCA, CVTS was consulted regarding CABG + AVR given moderate AS. Patient had AVR with tissue valve and RIMA-RCA in 3/12. Post-op echo in 5/12 showed EF 55-60% and a well-seated bioprosthetic valve. Most recent echo in 9/17 showed EF 60 to 65% and stable bioprosthetic aortic valve. Other issues as noted below and include chronic LBBB, HTN, HLD and GERD.   Last seen by Dr. Aundra Dubin back in June of  2017 and she was felt to be doing well.  I saw her back in December. Had had a URI and otherwise felt to be stable. She made it very clear that she "did not like doctors". Did not wish to have her carotid doppler updated.   In the ER 1 week ago - was in the record that she was felt to have gotten overheated - had vasovagal syncope/migraine - turned her ankle in the process with subsequent fibular fracture and possible medial malleolus avulsion fracture. CT negative. EKG with NSR and LBBB.   Comes back today. Here with her husband. She is in a wheelchair. Events of 1 week ago noted. She is taking high dose aspirin twice a day - says "she was told to do this due to clots" by ortho due to her broken leg. She tells me that this spell happened after being in her house for several hours - she was really NOT outside that long. She made 2 batches of chicken salad and was in her kitchen - felt like she was getting a migraine (which she has chronically) and then "just fell out" - she feels this may have been aggravated by vertigo - which she also has chronically. Prior to this, she was doing fine. No chest pain. Breathing is good. No palpitations. She had what was thought to be an enlarged lymph node and had had an US of the neck - this was ok but did note  carotid disease - last doppler was in 10/2015. She does have chronic LBBB.   Past Medical History: 1. 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 HEART VALVE DISEASE: Echo (1/12) with mildly dilated LV, EF 45-50% with paradoxical septal motion consistent with LBBB, moderate aortic stenosis with mean gradient 27 mmHg, trivial AI, mild to moderate mitral regurgitation with calcified mitral valve. 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 Echo (5/12): EF 55-60%, basal inferior hypokinesis, moderate diastolic dysfunction, normally functioning bioprosthetic aortic valve with mean gradient 13 mmHg, normal RV. Echo (9/14) with EF 60-65%,  bioprosthetic aortic valve with mean gradient 16 mmHg, no AI, mild-moderate MR. Echo (9/16) with EF 55-60%, bioprosthetic aortic valve normal.  4. LBBB 5. ALLERGIC RHINITIS  6. GERD 7. Hx of DIVERTICULITIS 8. Chronic low back pain with history of back surgery 9. Appendectomy 10. Hysterectomy 11. Hyperlipidemia: Myalgias with atorvastatin and Crestor.  12. CAD: Adenosine myoview (2/12) with EF 56%, ischemia with scar in the mid to apical anterior wall, septal wall, and apex. LHC (3/12) with EF 55%, 40% ostial RCA, 60% mid RCA, 95% distal RCA. Intervention attempted but unable to adequately seat catheter. Patient had CABG-AVR in 3/12 with RIMA-RCA.  13. Carotid dopplers (3/12): no significant stenosis. Carotid dopplers (9/14) with 40-59% bilateral ICA stenosis. Carotid dopplers (9/15) with 40-59% bilateral ICA stenosis. Carotid dopplers (9/16) with 1-39% BICA stenosis, >50% left subclavian stenosis.  14. Vertigo: labyrinthitis  15. Plantar fasciitis   Past Medical History:  Diagnosis Date  . Allergic rhinitis, cause unspecified   . Aortic stenosis    a.  echo 1/12 w/mildly dilated LV, EF 45-50% w/paradoxical septal motion consistent w/ LBBB, moderate aortic stenosis w/mean gradient 27 mmHg, trivial AI, mild to moderate MR w/calcified mitral valve;   b. s/p AVR with 21 mm Magna Ease valve  . Aortic valve replaced 2012  . Arthritis    joint pain  . CAD (coronary artery disease)    a. s/p CABG 4/12: RIMA-RCA  . CAD, multiple vessel    sees Dr Aundra Dubin every 3-4 months  . Cancer ([place])    skin sarcomas removed  . CHF (congestive heart failure) ([facility])    chronic diastolic CHF  . Chronic back pain    H/O BACK SURGERY  . Coronary artery disease    adenosine myoview 2/12 w/EF 56%, ISCHEMIA W/SCAR IN THE MID TO APICAL ANTERIO WALL, SEPTAL WALL, AND APEX. LHC 3/12 W/EF 55%, 40% OSTIAL RCA, 60% MID RCA, 95% DISTAL RC A, INTERVENTION  ATTEMPTED BUT UNABLE TO ADEQUADATELY SEAT  CATHETER.    . Diverticulitis of colon (without mention of hemorrhage)(562.11)   . Diverticulosis   . Esophageal reflux   . Glucose intolerance (impaired glucose tolerance)    A1C 6.3 (4/12)  . H/O hiatal hernia   . H/O vertigo    with fluid  in ears  . Heart murmur   . Hemorrhoids   . Hemorrhoids   . History of rheumatic fever   . Hx of hysterectomy   . Hx: UTI (urinary tract infection)   . Hyperlipidemia   . LBBB (left bundle branch block)   . Myocardial infarction ([place]) 2002  . Pneumonia   . PONV (postoperative nausea and vomiting)   . 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 Snoring   . Urinary frequency     Past Surgical History:  Procedure Laterality Date  . ABDOMINAL HYSTERECTOMY    . APPENDECTOMY  1973  . BACK  SURGERY     Lumbar Lam and discectomy x 2  . BREAST BIOPSY Right 10/2011  . BREAST LUMPECTOMY Right   . CARDIAC CATHETERIZATION  2012  . CARDIAC VALVE REPLACEMENT  2012   Aortic  . CORONARY ARTERY BYPASS GRAFT  2012  . DILATION AND CURETTAGE OF UTERUS    . ENDOMETRIAL ABLATION    . LAPAROSCOPIC LYSIS INTESTINAL ADHESIONS    . LUMBAR [place]  . SKIN SURGERY     skin sarcomas removed  . TONSILLECTOMY     1955  . TONSILLECTOMY       Medications: Current Outpatient Prescriptions  Medication Sig Dispense Refill  . acetaminophen (TYLENOL) 500 MG tablet Take 500 mg by mouth every 6 (six) hours as needed for moderate pain.    Marland Kitchen aspirin 325 MG tablet Take 1 tablet (325 mg total) by mouth 2 (two) times daily.    Marland Kitchen azithromycin (ZITHROMAX) 500 MG tablet TAKE 1 500 MG TABLET 1 HR PRIOR TO DENTAL WORK 1 tablet 0  . carvedilol (COREG) 6.25 MG tablet Take 1 tablet (6.25 mg total) by mouth 2 (two) times daily with a meal. 180 tablet 2  . cetirizine (ZYRTEC) 10 MG tablet Take 10 mg by mouth daily.    . fluticasone (FLONASE) 50 MCG/ACT nasal spray Place 1 spray into both nostrils daily. 29.7 g 3  . furosemide (LASIX) 20 MG tablet TAKE 1 TABLET EVERY  DAY  FOR  FLUID  RETENTION 90 tablet 2  . loratadine (CLARITIN) 10 MG tablet Take 10 mg by mouth daily. Reported on 04/25/2015    . Omega-3 Fatty Acids (FISH OIL) 1000 MG CAPS Take 2 capsules by mouth 2 (two) times daily.    . potassium chloride (K-DUR,KLOR-CON) 10 MEQ tablet Take 1 tablet (10 mEq total) by mouth daily. 90 tablet 2  . ranitidine (ZANTAC) 150 MG tablet Take 150 mg by mouth daily as needed. For reflux    . simvastatin (ZOCOR) 40 MG tablet Take 1 tablet (40 mg total) by mouth at bedtime. 90 tablet 3   No current facility-administered medications for this visit.     Allergies: Allergies  Allergen Reactions  . Tape Hives    Breakouts from the adhesive in paper tape and patches  . Amoxicillin Itching  . Crestor [Rosuvastatin] Other (See Comments)    myalgia  . Lipitor [Atorvastatin] Other (See Comments)    myalgia  . Neomycin Itching    Topical neomycin   . Sulfamethoxazole-Trimethoprim Hives    Social History: The patient  reports that she quit smoking about 16 years ago. Her smoking use included Cigarettes. She has a 40.00 pack-year smoking history. She has quit using smokeless tobacco. Her smokeless tobacco use included Chew. She reports that she drinks alcohol. She reports that she does not use drugs.   Family History: The patient's family history includes Breast cancer in her maternal aunt, mother, paternal grandmother, and sister; CAD in her father; Colon cancer in her maternal aunt and paternal uncle; Diabetes type II in her mother; Heart attack in her father; Heart disease in her mother; Hypertension in her mother; Hypothyroidism in her mother; Pancreatic cancer in her other.   Review of Systems: Please see the history of present illness.   Otherwise, the review of systems is positive for migraines, allergies.   All other systems are reviewed and negative.   Physical Exam: VS:  BP (!) 130/52 (BP Location: Left Arm, Patient  Position: Sitting, Cuff Size: Large)    Pulse 68   Ht 5\' 3"  (1.6 m)   SpO2 96% Comment: at rest .  BMI There is no height or weight on file to calculate BMI.  Wt Readings from Last 3 Encounters:  07/02/16 169 lb (76.7 kg)  01/22/16 173 lb 12.8 oz (78.8 kg)  11/14/15 171 lb 4 oz (77.7 kg)    General: Pleasant. She is not able to weigh. She is in a wheelchair. Cast on the right lower leg.    HEENT: Normal.  Neck: Supple, no JVD, carotid bruits, or masses noted.  Cardiac: Regular rate and rhythm. Harsh outflow murmur. No edema.  Respiratory:  Lungs are clear to auscultation bilaterally with normal work of breathing.  GI: Soft and nontender.  MS: No deformity or atrophy. Gait not tested  Skin: Warm and dry. Color is normal.  Neuro:  Strength and sensation are intact and no gross focal deficits noted.  Psych: Alert, appropriate and with normal affect.   LABORATORY DATA:  EKG:  EKG is not ordered today. EKG from last week noted - NSR with LBBB  Lab Results  Component Value Date   WBC 7.2 07/13/2016   HGB 12.1 07/13/2016   HCT 37.5 07/13/2016   PLT 223 07/13/2016   GLUCOSE 133 (H) 07/13/2016   CHOL 154 11/14/2015   TRIG 209.0 (H) 11/14/2015   HDL 43.90 11/14/2015   LDLDIRECT 87.0 11/14/2015   LDLCALC 60 05/09/2015   ALT 14 07/02/2016   AST 15 07/02/2016   NA 136 07/13/2016   K 4.2 07/13/2016   CL 100 (L) 07/13/2016   CREATININE 0.85 07/13/2016   BUN 18 07/13/2016   CO2 27 07/13/2016   TSH 3.90 07/02/2016   INR 3.1 06/18/2010   HGBA1C 7.1 (H) 07/02/2016   MICROALBUR <0.7 11/14/2015     BNP (last 3 results) No results for input(s): BNP in the last 8760 hours.  ProBNP (last 3 results) No results for input(s): PROBNP in the last 8760 hours.   Other Studies Reviewed Today:  Echo Study Conclusions from 10/2015  - Left ventricle: The cavity size was normal. Wall thickness was normal. Systolic function was normal. The estimated ejection fraction was in the range of 60% to 65%. Septal bounce  consistent with prior cardiac surgery. Wall motion was normal; there were no regional wall motion abnormalities. Doppler parameters are consistent with abnormal left ventricular relaxation (grade 1 diastolic dysfunction). - Aortic valve: Bioprosthetic aortic valve. No significant stenosis or regurgitation. Mean gradient (S): 13 mm Hg. - Mitral valve: Moderately calcified annulus. There was mild regurgitation. - Right ventricle: The cavity size was normal. Systolic function was normal. - Tricuspid valve: Peak RV-RA gradient (S): 24 mm Hg. - Pulmonary arteries: PA peak pressure: 27 mm Hg (S). - Inferior vena cava: The vessel was normal in size. The respirophasic diameter changes were in the normal range (= 50%), consistent with normal central venous pressure.  Impressions:  - Normal LV size with EF 60-65%. Septal bounce consistent with prior cardiac surgery. Bioprosthetic aortic valve with no significant stenosis or regurgitation. Normal RV size and systolic function.  Assessment/Plan:  1. Syncope - this does not sound like she got overheated to me - she had been inside her house for several hours when this spell happened. 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 I worry that this is a possible conduction disorder - she is agreeable to event monitor -  placing today. She had a negative CT - last echo 9 months ago. I will get her to see Dr. Saunders Revel for discussion. I will be happy to see her back as well.   2. Aortic valve disorder Patient had moderate AS likely due to rheumatic heart disease. She had AVR with bioprosthetic valve in 3/12 since she was also having CABG. Valve was well-seated on last echo in 9/17. Would plan on updating in September.   3. CAD Status post RIMA-RCA in 3/12. No chest pain. She will continue ASA, statin, and Coreg. She is on high dose aspirin due to fractured leg on advice from orthopedics.  No active chest pain. Would favor continued medical management.   4. Diastolic CHF, chronic  NYHA class I/II symptoms, stable. She is not appear volume overloaded. Continue current Lasix and KCl.   5. HYPERLIPIDEMIA Myalgias with atorvastatin and Crestor. She is on simvastatin. Good lipids in 10/17. Followed by PCP  6. Carotid disease No significant carotid stenosis in 9/17 - has had some subclavian stenosis - great radial pulse on the left. Continue with statin therapy, diabetic control, etc. Recent US of the neck noted - would consider updating in 10/2017.    Current medicines are reviewed with the patient today.  The patient does not have concerns regarding medicines other than what has been noted above.  The following changes have been made:  See above.  Labs/ tests ordered today include:   No orders of the defined types were placed in this encounter.    Disposition:   Disposition as noted above.    Patient is agreeable to this plan and will call if any problems develop in the interim.   SignedTruitt Merle, NP  07/21/2016 9:34 AM  [facility] [address] [place], [place]  [address] Phone: [phone] Fax: [phone]

## 2016-07-21 NOTE — Patient Instructions (Addendum)
We will be checking the following labs today - NONE   Medication Instructions:    Continue with your current medicines.      Testing/Procedures To Be Arranged:  Event monitor  Follow-Up:   See Dr. Saunders Revel in 5 to 6 weeks for follow up    Other Special Instructions:   N/A    If you need a refill on your cardiac medications before your next appointment, please call your pharmacy.   Call the [place] office at [phone] if you have any questions, problems or concerns.

## 2016-07-22 DIAGNOSIS — R55 Syncope and collapse: Secondary | ICD-10-CM | POA: Diagnosis not present

## 2016-07-23 ENCOUNTER — Ambulatory Visit (INDEPENDENT_AMBULATORY_CARE_PROVIDER_SITE_OTHER): Payer: Medicare HMO | Admitting: Internal Medicine

## 2016-07-23 ENCOUNTER — Other Ambulatory Visit: Payer: Self-pay | Admitting: Internal Medicine

## 2016-07-23 ENCOUNTER — Encounter: Payer: Self-pay | Admitting: Internal Medicine

## 2016-07-23 VITALS — BP 130/60 | HR 67 | Temp 98.1°F

## 2016-07-23 DIAGNOSIS — R21 Rash and other nonspecific skin eruption: Secondary | ICD-10-CM | POA: Diagnosis not present

## 2016-07-23 DIAGNOSIS — S82831D Other fracture of upper and lower end of right fibula, subsequent encounter for closed fracture with routine healing: Secondary | ICD-10-CM | POA: Diagnosis not present

## 2016-07-23 DIAGNOSIS — L905 Scar conditions and fibrosis of skin: Secondary | ICD-10-CM | POA: Diagnosis not present

## 2016-07-23 NOTE — Progress Notes (Signed)
Subjective:  Patient ID: Heather Crawford, female    DOB: 06-23-1945  Age: 71 y.o. MRN: [id number]  CC: Rash   HPI Heather Crawford presents for concerns about a recurrent rash on her right knee for 8 weeks. She was in [place] when she fell and abraded her right knee. She was seen locally and had local wound care and a plain x-ray was normal. The wounds healed but continue to occasionally be scabbed, irritated, and drain. She has a lesion over the right patella that is red and persistent. She also has a scab over the tibial tuberosity. Both of these lesions continue to bother her. A recent plain x-ray was done after a fall which was normal.  Outpatient Medications Prior to Visit  Medication Sig Dispense Refill  . acetaminophen (TYLENOL) 500 MG tablet Take 500 mg by mouth every 6 (six) hours as needed for moderate pain.    Marland Kitchen aspirin 325 MG tablet Take 1 tablet (325 mg total) by mouth 2 (two) times daily.    Marland Kitchen azithromycin (ZITHROMAX) 500 MG tablet TAKE 1 500 MG TABLET 1 HR PRIOR TO DENTAL WORK 1 tablet 0  . carvedilol (COREG) 6.25 MG tablet Take 1 tablet (6.25 mg total) by mouth 2 (two) times daily with a meal. 180 tablet 2  . cetirizine (ZYRTEC) 10 MG tablet Take 10 mg by mouth daily.    . fluticasone (FLONASE) 50 MCG/ACT nasal spray Place 1 spray into both nostrils daily. 29.7 g 3  . furosemide (LASIX) 20 MG tablet TAKE 1 TABLET EVERY DAY  FOR  FLUID  RETENTION 90 tablet 2  . loratadine (CLARITIN) 10 MG tablet Take 10 mg by mouth daily. Reported on 04/25/2015    . Omega-3 Fatty Acids (FISH OIL) 1000 MG CAPS Take 2 capsules by mouth 2 (two) times daily.    . potassium chloride (K-DUR,KLOR-CON) 10 MEQ tablet Take 1 tablet (10 mEq total) by mouth daily. 90 tablet 2  . ranitidine (ZANTAC) 150 MG tablet Take 150 mg by mouth daily as needed. For reflux    . simvastatin (ZOCOR) 40 MG tablet Take 1 tablet (40 mg total) by mouth at bedtime. 90 tablet 3   No facility-administered medications  prior to visit.     ROS Review of Systems  Skin: Positive for rash and wound.    Objective:  BP 130/60 (BP Location: Left Arm, Patient Position: Sitting, Cuff Size: Large)   Pulse 67   Temp 98.1 F (36.7 C) (Oral)   SpO2 99%   BP Readings from Last 3 Encounters:  07/23/16 130/60  07/21/16 (!) 130/52  07/14/16 (!) 145/55    Wt Readings from Last 3 Encounters:  07/02/16 169 lb (76.7 kg)  01/22/16 173 lb 12.8 oz (78.8 kg)  11/14/15 171 lb 4 oz (77.7 kg)    Physical Exam  Musculoskeletal:       Right shoulder: She exhibits deformity. She exhibits normal range of motion, no swelling and no crepitus.  There is a lesion over the skin of the right patella that measures 1 x 2 cm. It is rounded and shows confluent erythema. There is no exudate, induration, fluctuance, tenderness, or streaking.  Just below that over the tibial tuberosity is a 1 cm round, healed scab.   Skin:  The erythematous lesion over the patella was cleaned with Betadine. The area was prepped and draped in sterile fashion. Local anesthesia was obtained with instillation of 2% lidocaine with epi. Approximately 1 mL was used.  A 4 mm punch incision was obtained and sent for pathology. The wound was closed with one simple interrupted suture using 3-0 nylon PC 3. Closure affect is good. Neosporin and dressing were applied.    Lab Results  Component Value Date   WBC 7.2 07/13/2016   HGB 12.1 07/13/2016   HCT 37.5 07/13/2016   PLT 223 07/13/2016   GLUCOSE 133 (H) 07/13/2016   CHOL 154 11/14/2015   TRIG 209.0 (H) 11/14/2015   HDL 43.90 11/14/2015   LDLDIRECT 87.0 11/14/2015   LDLCALC 60 05/09/2015   ALT 14 07/02/2016   AST 15 07/02/2016   NA 136 07/13/2016   K 4.2 07/13/2016   CL 100 (L) 07/13/2016   CREATININE 0.85 07/13/2016   BUN 18 07/13/2016   CO2 27 07/13/2016   TSH 3.90 07/02/2016   INR 3.1 06/18/2010   HGBA1C 7.1 (H) 07/02/2016   MICROALBUR <0.7 11/14/2015    Ct Head Wo Contrast  Result Date:  07/14/2016 CLINICAL DATA:  Acute onset of migraine headache, with dizziness and near syncope. Status post fall. Concern for cervical spine injury. Initial encounter. EXAM: CT HEAD WITHOUT CONTRAST CT CERVICAL SPINE WITHOUT CONTRAST TECHNIQUE: Multidetector CT imaging of the head and cervical spine was performed following the standard protocol without intravenous contrast. Multiplanar CT image reconstructions of the cervical spine were also generated. COMPARISON:  CT of the neck performed 07/07/2016 FINDINGS: CT HEAD FINDINGS Brain: No evidence of acute infarction, hemorrhage, hydrocephalus, extra-axial collection or mass lesion/mass effect. Prominence of the sulci suggests mild cortical volume loss. Mild cerebellar atrophy is noted. The brainstem and fourth ventricle are within normal limits. The basal ganglia are unremarkable in appearance. The cerebral hemispheres demonstrate grossly normal gray-white differentiation. No mass effect or midline shift is seen. Vascular: No hyperdense vessel or unexpected calcification. Skull: There is no evidence of fracture; visualized osseous structures are unremarkable in appearance. Sinuses/Orbits: The orbits are within normal limits. There is partial opacification of the mastoid air cells bilaterally. The paranasal sinuses are well-aerated. Other: No significant soft tissue abnormalities are seen. CT CERVICAL SPINE FINDINGS Alignment: Normal. Skull base and vertebrae: No acute fracture. No primary bone lesion or focal pathologic process. Soft tissues and spinal canal: No prevertebral fluid or swelling. No visible canal hematoma. Disc levels: Endplate degenerative change is noted at C5-C6, with associated anterior and posterior disc osteophyte complexes. Upper chest: Mild emphysema is noted at the lung apices. The thyroid gland is grossly unremarkable. Scattered calcification is seen along the proximal great vessels. Mild calcification is seen at the carotid bifurcations  bilaterally. Other: No additional soft tissue abnormalities are seen. IMPRESSION: 1. No evidence of traumatic intracranial injury or fracture. 2. No evidence of fracture or subluxation along the cervical spine. 3. Mild cortical volume loss noted. 4. Mild degenerative change at the lower cervical spine. 5. Partial opacification of the mastoid air cells bilaterally. 6. Scattered calcification along the proximal great vessels. Mild calcification at the carotid bifurcations bilaterally. Mild to moderate luminal narrowing was noted at the proximal left subclavian artery and proximal left common carotid artery on recent CT of the neck. Electronically Signed   By: Garald Balding M.D.   On: 07/14/2016 02:28   Ct Cervical Spine Wo Contrast  Result Date: 07/14/2016 CLINICAL DATA:  Acute onset of migraine headache, with dizziness and near syncope. Status post fall. Concern for cervical spine injury. Initial encounter. EXAM: CT HEAD WITHOUT CONTRAST CT CERVICAL SPINE WITHOUT CONTRAST TECHNIQUE: Multidetector CT imaging of the head  and cervical spine was performed following the standard protocol without intravenous contrast. Multiplanar CT image reconstructions of the cervical spine were also generated. COMPARISON:  CT of the neck performed 07/07/2016 FINDINGS: CT HEAD FINDINGS Brain: No evidence of acute infarction, hemorrhage, hydrocephalus, extra-axial collection or mass lesion/mass effect. Prominence of the sulci suggests mild cortical volume loss. Mild cerebellar atrophy is noted. The brainstem and fourth ventricle are within normal limits. The basal ganglia are unremarkable in appearance. The cerebral hemispheres demonstrate grossly normal gray-white differentiation. No mass effect or midline shift is seen. Vascular: No hyperdense vessel or unexpected calcification. Skull: There is no evidence of fracture; visualized osseous structures are unremarkable in appearance. Sinuses/Orbits: The orbits are within normal limits.  There is partial opacification of the mastoid air cells bilaterally. The paranasal sinuses are well-aerated. Other: No significant soft tissue abnormalities are seen. CT CERVICAL SPINE FINDINGS Alignment: Normal. Skull base and vertebrae: No acute fracture. No primary bone lesion or focal pathologic process. Soft tissues and spinal canal: No prevertebral fluid or swelling. No visible canal hematoma. Disc levels: Endplate degenerative change is noted at C5-C6, with associated anterior and posterior disc osteophyte complexes. Upper chest: Mild emphysema is noted at the lung apices. The thyroid gland is grossly unremarkable. Scattered calcification is seen along the proximal great vessels. Mild calcification is seen at the carotid bifurcations bilaterally. Other: No additional soft tissue abnormalities are seen. IMPRESSION: 1. No evidence of traumatic intracranial injury or fracture. 2. No evidence of fracture or subluxation along the cervical spine. 3. Mild cortical volume loss noted. 4. Mild degenerative change at the lower cervical spine. 5. Partial opacification of the mastoid air cells bilaterally. 6. Scattered calcification along the proximal great vessels. Mild calcification at the carotid bifurcations bilaterally. Mild to moderate luminal narrowing was noted at the proximal left subclavian artery and proximal left common carotid artery on recent CT of the neck. Electronically Signed   By: Garald Balding M.D.   On: 07/14/2016 02:28   Dg Knee Complete 4 Views Right  Result Date: 07/14/2016 CLINICAL DATA:  Status post fall, with right upper leg tenderness and pain. Initial encounter. EXAM: RIGHT KNEE - COMPLETE 4+ VIEW COMPARISON:  None. FINDINGS: There is no evidence of fracture or dislocation. The joint spaces are preserved. No significant degenerative change is seen; the patellofemoral joint is grossly unremarkable in appearance. No significant joint effusion is seen. Scattered vascular calcifications are  seen. IMPRESSION: 1. No evidence of fracture or dislocation. 2. Scattered vascular calcifications noted. Electronically Signed   By: Garald Balding M.D.   On: 07/14/2016 04:34   Dg Hip Unilat W Or Wo Pelvis 2-3 Views Right  Result Date: 07/14/2016 CLINICAL DATA:  Status post fall, with right upper leg pain and tenderness. Initial encounter. EXAM: DG HIP (WITH OR WITHOUT PELVIS) 2-3V RIGHT COMPARISON:  None. FINDINGS: There is no evidence of fracture or dislocation. Both femoral heads are seated normally within their respective acetabula. The proximal right femur appears intact. No significant degenerative change is appreciated. The sacroiliac joints are unremarkable in appearance. The visualized bowel gas pattern is grossly unremarkable in appearance. IMPRESSION: No evidence of fracture or dislocation. Electronically Signed   By: Garald Balding M.D.   On: 07/14/2016 04:35    Assessment & Plan:   Jhene was seen today for rash.  Diagnoses and all orders for this visit:  Rash of unknown cause- biopsy performed and sent, the results confirm that this is scar. She was offered reassurance. Will use  topical mederma for symptom relief. -     Dermatology pathology; Future   I am having Ms. Barham start on [facility] SPF 30. I am also having her maintain her ranitidine, loratadine, cetirizine, azithromycin, Fish Oil, simvastatin, furosemide, fluticasone, carvedilol, potassium chloride, acetaminophen, and aspirin.  Meds ordered this encounter  Medications  . Scar Treatment Products ([place] SPF 30) CREA    Sig: Apply 1 Act topically daily.    Dispense:  20 g    Refill:  2     Follow-up: Return in about 1 week (around 07/30/2016).  Scarlette Calico, MD

## 2016-07-23 NOTE — Patient Instructions (Signed)

## 2016-07-27 ENCOUNTER — Encounter: Payer: Self-pay | Admitting: Internal Medicine

## 2016-07-27 MED ORDER — MEDERMA SPF 30 EX CREA: 1.0000 | TOPICAL_CREAM | Freq: Every day | CUTANEOUS | 2 refills | Status: DC

## 2016-07-28 ENCOUNTER — Ambulatory Visit (INDEPENDENT_AMBULATORY_CARE_PROVIDER_SITE_OTHER): Payer: Medicare HMO | Admitting: Internal Medicine

## 2016-07-28 ENCOUNTER — Encounter: Payer: Self-pay | Admitting: Internal Medicine

## 2016-07-28 VITALS — BP 120/60 | HR 66 | Temp 98.0°F

## 2016-07-28 DIAGNOSIS — L251 Unspecified contact dermatitis due to drugs in contact with skin: Secondary | ICD-10-CM

## 2016-07-28 DIAGNOSIS — L905 Scar conditions and fibrosis of skin: Secondary | ICD-10-CM | POA: Diagnosis not present

## 2016-07-28 NOTE — Progress Notes (Signed)
Subjective:  Patient ID: Heather Crawford, female    DOB: 10-13-1945  Age: 71 y.o. MRN: [id number]  CC: Rash and Wound Check   HPI Heather Crawford presents for f/up - rash over her right knee. She underwent a biopsy about a week ago. It was remarkable only for scar. Since I last saw her she complains that a few more "bubbles" have accumulated around the scar just over the right patella. The site of the punch biopsy has healed nicely with no pain, swelling, or drainage. The rash recurred after she had been using Polysporin on it for a few days. I told her not to put anything on top of this other than mederma.  Outpatient Medications Prior to Visit  Medication Sig Dispense Refill  . acetaminophen (TYLENOL) 500 MG tablet Take 500 mg by mouth every 6 (six) hours as needed for moderate pain.    Marland Kitchen aspirin 325 MG tablet Take 1 tablet (325 mg total) by mouth 2 (two) times daily.    Marland Kitchen azithromycin (ZITHROMAX) 500 MG tablet TAKE 1 500 MG TABLET 1 HR PRIOR TO DENTAL WORK 1 tablet 0  . carvedilol (COREG) 6.25 MG tablet Take 1 tablet (6.25 mg total) by mouth 2 (two) times daily with a meal. 180 tablet 2  . cetirizine (ZYRTEC) 10 MG tablet Take 10 mg by mouth daily.    . fluticasone (FLONASE) 50 MCG/ACT nasal spray Place 1 spray into both nostrils daily. 29.7 g 3  . furosemide (LASIX) 20 MG tablet TAKE 1 TABLET EVERY DAY  FOR  FLUID  RETENTION 90 tablet 2  . loratadine (CLARITIN) 10 MG tablet Take 10 mg by mouth daily. Reported on 04/25/2015    . Omega-3 Fatty Acids (FISH OIL) 1000 MG CAPS Take 2 capsules by mouth 2 (two) times daily.    . potassium chloride (K-DUR,KLOR-CON) 10 MEQ tablet Take 1 tablet (10 mEq total) by mouth daily. 90 tablet 2  . ranitidine (ZANTAC) 150 MG tablet Take 150 mg by mouth daily as needed. For reflux    . Scar Treatment Products ([place] SPF 30) CREA Apply 1 Act topically daily. 20 g 2  . simvastatin (ZOCOR) 40 MG tablet Take 1 tablet (40 mg total) by mouth at bedtime. 90  tablet 3   No facility-administered medications prior to visit.     ROS Review of Systems  Skin: Positive for color change and rash.  All other systems reviewed and are negative.   Objective:  BP 120/60 (BP Location: Left Arm, Patient Position: Sitting, Cuff Size: Large)   Pulse 66   Temp 98 F (36.7 C) (Oral)   SpO2 100%   BP Readings from Last 3 Encounters:  07/28/16 120/60  07/23/16 130/60  07/21/16 (!) 130/52    Wt Readings from Last 3 Encounters:  07/02/16 169 lb (76.7 kg)  01/22/16 173 lb 12.8 oz (78.8 kg)  11/14/15 171 lb 4 oz (77.7 kg)    Physical Exam  Musculoskeletal:       Legs:   Lab Results  Component Value Date   WBC 7.2 07/13/2016   HGB 12.1 07/13/2016   HCT 37.5 07/13/2016   PLT 223 07/13/2016   GLUCOSE 133 (H) 07/13/2016   CHOL 154 11/14/2015   TRIG 209.0 (H) 11/14/2015   HDL 43.90 11/14/2015   LDLDIRECT 87.0 11/14/2015   LDLCALC 60 05/09/2015   ALT 14 07/02/2016   AST 15 07/02/2016   NA 136 07/13/2016   K 4.2 07/13/2016  CL 100 (L) 07/13/2016   CREATININE 0.85 07/13/2016   BUN 18 07/13/2016   CO2 27 07/13/2016   TSH 3.90 07/02/2016   INR 3.1 06/18/2010   HGBA1C 7.1 (H) 07/02/2016   MICROALBUR <0.7 11/14/2015    Ct Head Wo Contrast  Result Date: 07/14/2016 CLINICAL DATA:  Acute onset of migraine headache, with dizziness and near syncope. Status post fall. Concern for cervical spine injury. Initial encounter. EXAM: CT HEAD WITHOUT CONTRAST CT CERVICAL SPINE WITHOUT CONTRAST TECHNIQUE: Multidetector CT imaging of the head and cervical spine was performed following the standard protocol without intravenous contrast. Multiplanar CT image reconstructions of the cervical spine were also generated. COMPARISON:  CT of the neck performed 07/07/2016 FINDINGS: CT HEAD FINDINGS Brain: No evidence of acute infarction, hemorrhage, hydrocephalus, extra-axial collection or mass lesion/mass effect. Prominence of the sulci suggests mild cortical volume  loss. Mild cerebellar atrophy is noted. The brainstem and fourth ventricle are within normal limits. The basal ganglia are unremarkable in appearance. The cerebral hemispheres demonstrate grossly normal gray-white differentiation. No mass effect or midline shift is seen. Vascular: No hyperdense vessel or unexpected calcification. Skull: There is no evidence of fracture; visualized osseous structures are unremarkable in appearance. Sinuses/Orbits: The orbits are within normal limits. There is partial opacification of the mastoid air cells bilaterally. The paranasal sinuses are well-aerated. Other: No significant soft tissue abnormalities are seen. CT CERVICAL SPINE FINDINGS Alignment: Normal. Skull base and vertebrae: No acute fracture. No primary bone lesion or focal pathologic process. Soft tissues and spinal canal: No prevertebral fluid or swelling. No visible canal hematoma. Disc levels: Endplate degenerative change is noted at C5-C6, with associated anterior and posterior disc osteophyte complexes. Upper chest: Mild emphysema is noted at the lung apices. The thyroid gland is grossly unremarkable. Scattered calcification is seen along the proximal great vessels. Mild calcification is seen at the carotid bifurcations bilaterally. Other: No additional soft tissue abnormalities are seen. IMPRESSION: 1. No evidence of traumatic intracranial injury or fracture. 2. No evidence of fracture or subluxation along the cervical spine. 3. Mild cortical volume loss noted. 4. Mild degenerative change at the lower cervical spine. 5. Partial opacification of the mastoid air cells bilaterally. 6. Scattered calcification along the proximal great vessels. Mild calcification at the carotid bifurcations bilaterally. Mild to moderate luminal narrowing was noted at the proximal left subclavian artery and proximal left common carotid artery on recent CT of the neck. Electronically Signed   By: Garald Balding M.D.   On: 07/14/2016 02:28     Ct Cervical Spine Wo Contrast  Result Date: 07/14/2016 CLINICAL DATA:  Acute onset of migraine headache, with dizziness and near syncope. Status post fall. Concern for cervical spine injury. Initial encounter. EXAM: CT HEAD WITHOUT CONTRAST CT CERVICAL SPINE WITHOUT CONTRAST TECHNIQUE: Multidetector CT imaging of the head and cervical spine was performed following the standard protocol without intravenous contrast. Multiplanar CT image reconstructions of the cervical spine were also generated. COMPARISON:  CT of the neck performed 07/07/2016 FINDINGS: CT HEAD FINDINGS Brain: No evidence of acute infarction, hemorrhage, hydrocephalus, extra-axial collection or mass lesion/mass effect. Prominence of the sulci suggests mild cortical volume loss. Mild cerebellar atrophy is noted. The brainstem and fourth ventricle are within normal limits. The basal ganglia are unremarkable in appearance. The cerebral hemispheres demonstrate grossly normal gray-white differentiation. No mass effect or midline shift is seen. Vascular: No hyperdense vessel or unexpected calcification. Skull: There is no evidence of fracture; visualized osseous structures are unremarkable in appearance.  Sinuses/Orbits: The orbits are within normal limits. There is partial opacification of the mastoid air cells bilaterally. The paranasal sinuses are well-aerated. Other: No significant soft tissue abnormalities are seen. CT CERVICAL SPINE FINDINGS Alignment: Normal. Skull base and vertebrae: No acute fracture. No primary bone lesion or focal pathologic process. Soft tissues and spinal canal: No prevertebral fluid or swelling. No visible canal hematoma. Disc levels: Endplate degenerative change is noted at C5-C6, with associated anterior and posterior disc osteophyte complexes. Upper chest: Mild emphysema is noted at the lung apices. The thyroid gland is grossly unremarkable. Scattered calcification is seen along the proximal great vessels. Mild  calcification is seen at the carotid bifurcations bilaterally. Other: No additional soft tissue abnormalities are seen. IMPRESSION: 1. No evidence of traumatic intracranial injury or fracture. 2. No evidence of fracture or subluxation along the cervical spine. 3. Mild cortical volume loss noted. 4. Mild degenerative change at the lower cervical spine. 5. Partial opacification of the mastoid air cells bilaterally. 6. Scattered calcification along the proximal great vessels. Mild calcification at the carotid bifurcations bilaterally. Mild to moderate luminal narrowing was noted at the proximal left subclavian artery and proximal left common carotid artery on recent CT of the neck. Electronically Signed   By: Garald Balding M.D.   On: 07/14/2016 02:28   Dg Knee Complete 4 Views Right  Result Date: 07/14/2016 CLINICAL DATA:  Status post fall, with right upper leg tenderness and pain. Initial encounter. EXAM: RIGHT KNEE - COMPLETE 4+ VIEW COMPARISON:  None. FINDINGS: There is no evidence of fracture or dislocation. The joint spaces are preserved. No significant degenerative change is seen; the patellofemoral joint is grossly unremarkable in appearance. No significant joint effusion is seen. Scattered vascular calcifications are seen. IMPRESSION: 1. No evidence of fracture or dislocation. 2. Scattered vascular calcifications noted. Electronically Signed   By: Garald Balding M.D.   On: 07/14/2016 04:34   Dg Hip Unilat W Or Wo Pelvis 2-3 Views Right  Result Date: 07/14/2016 CLINICAL DATA:  Status post fall, with right upper leg pain and tenderness. Initial encounter. EXAM: DG HIP (WITH OR WITHOUT PELVIS) 2-3V RIGHT COMPARISON:  None. FINDINGS: There is no evidence of fracture or dislocation. Both femoral heads are seated normally within their respective acetabula. The proximal right femur appears intact. No significant degenerative change is appreciated. The sacroiliac joints are unremarkable in appearance. The  visualized bowel gas pattern is grossly unremarkable in appearance. IMPRESSION: No evidence of fracture or dislocation. Electronically Signed   By: Garald Balding M.D.   On: 07/14/2016 04:35    Assessment & Plan:   Heather Crawford was seen today for rash and wound check.  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  Scar of knee- I reassured her that the biopsy showed only scar and that she would benefit from [facility]   I am having Heather Crawford maintain her ranitidine, loratadine, cetirizine, azithromycin, Fish Oil, simvastatin, furosemide, fluticasone, carvedilol, potassium chloride, acetaminophen, aspirin, and [facility]ER[facility] SPF 30.  No orders of the defined types were placed in this encounter.    Follow-up: No Follow-up on file.  Heather Calico, MD

## 2016-07-28 NOTE — Patient Instructions (Signed)

## 2016-08-08 DIAGNOSIS — S82831D Other fracture of upper and lower end of right fibula, subsequent encounter for closed fracture with routine healing: Secondary | ICD-10-CM | POA: Diagnosis not present

## 2016-08-17 ENCOUNTER — Other Ambulatory Visit: Payer: Self-pay | Admitting: Internal Medicine

## 2016-08-17 ENCOUNTER — Encounter: Payer: Self-pay | Admitting: *Deleted

## 2016-08-17 ENCOUNTER — Other Ambulatory Visit: Payer: Self-pay | Admitting: Cardiology

## 2016-08-17 DIAGNOSIS — J309 Allergic rhinitis, unspecified: Secondary | ICD-10-CM

## 2016-08-22 DIAGNOSIS — S82831D Other fracture of upper and lower end of right fibula, subsequent encounter for closed fracture with routine healing: Secondary | ICD-10-CM | POA: Diagnosis not present

## 2016-08-28 ENCOUNTER — Encounter: Payer: Self-pay | Admitting: Internal Medicine

## 2016-08-28 ENCOUNTER — Ambulatory Visit (HOSPITAL_COMMUNITY)
Admission: RE | Admit: 2016-08-28 | Discharge: 2016-08-28 | Disposition: A | Discharge status: Home / Self Care | Payer: Medicare HMO | Source: Ambulatory Visit | Attending: Cardiovascular Disease | Admitting: Cardiovascular Disease

## 2016-08-28 ENCOUNTER — Encounter (HOSPITAL_COMMUNITY): Payer: Self-pay | Admitting: Cardiology

## 2016-08-28 ENCOUNTER — Ambulatory Visit (INDEPENDENT_AMBULATORY_CARE_PROVIDER_SITE_OTHER): Payer: Medicare HMO | Admitting: Internal Medicine

## 2016-08-28 VITALS — BP 124/64 | HR 60 | Ht 63.0 in

## 2016-08-28 DIAGNOSIS — R600 Localized edema: Secondary | ICD-10-CM

## 2016-08-28 DIAGNOSIS — R55 Syncope and collapse: Secondary | ICD-10-CM

## 2016-08-28 DIAGNOSIS — Z952 Presence of prosthetic heart valve: Secondary | ICD-10-CM | POA: Diagnosis not present

## 2016-08-28 DIAGNOSIS — I10 Essential (primary) hypertension: Secondary | ICD-10-CM | POA: Insufficient documentation

## 2016-08-28 DIAGNOSIS — I2510 Atherosclerotic heart disease of native coronary artery without angina pectoris: Secondary | ICD-10-CM | POA: Diagnosis not present

## 2016-08-28 DIAGNOSIS — I099 Rheumatic heart disease, unspecified: Secondary | ICD-10-CM

## 2016-08-28 DIAGNOSIS — E785 Hyperlipidemia, unspecified: Secondary | ICD-10-CM | POA: Diagnosis not present

## 2016-08-28 DIAGNOSIS — E119 Type 2 diabetes mellitus without complications: Secondary | ICD-10-CM | POA: Diagnosis not present

## 2016-08-28 DIAGNOSIS — Z87891 Personal history of nicotine dependence: Secondary | ICD-10-CM | POA: Insufficient documentation

## 2016-08-28 MED ORDER — POTASSIUM CHLORIDE CRYS ER 10 MEQ PO TBCR: EXTENDED_RELEASE_TABLET | ORAL | 2 refills | Status: DC

## 2016-08-28 MED ORDER — FUROSEMIDE 20 MG PO TABS: ORAL_TABLET | ORAL | 2 refills | Status: DC

## 2016-08-28 MED ORDER — SIMVASTATIN 40 MG PO TABS: 40.0000 mg | ORAL_TABLET | Freq: Every day | ORAL | 1 refills | Status: DC

## 2016-08-28 MED ORDER — CARVEDILOL 6.25 MG PO TABS: 6.2500 mg | ORAL_TABLET | Freq: Two times a day (BID) | ORAL | 1 refills | Status: DC

## 2016-08-28 NOTE — Progress Notes (Signed)
Follow-up Outpatient Visit Date: 08/28/2016  Primary Care Provider: Janith Lima, MD [address] [address]  Chief Complaint: Follow-up syncope  HPI:  Heather Crawford is a 71 y.o. year-old female with history of rheumatic fever and valvular heart disease complicated by aortic stenosis and mitral regurgitation with bioprosthetic AVR in 2012, coronary artery disease with RIMA to RCA at the time of aVR in 2012, mild to moderate bilateral internal carotid artery stenosis, and labyrinthitis with chronic recurrent vertigo, who presents for follow-up of syncope. She was seen on 07/21/16 by Truitt Merle after falling and fracturing her right fibula. She had been working in the kitchen and began feeling like her vision was narrowing. She did not have frank dizziness or lightheadedness but ultimately fell to the floor. She feels like the she passed out for several minutes, though her husband, who was present at the time, states that Heather Crawford never completely lost consciousness. She notes that the feeling in her head was similar to what she has experienced with vertigo in the past, though she has never fallen or passed out on account of it.  Since her last visit, Heather Crawford has not had any further falls or syncopal episodes. She wore a monitor for 30 days, which did not show any significant arrhythmias. She has not had any chest pain, shortness of breath, palpitations, orthopnea, or PND. Her right calf and ankle remain in a rigid brace with some edema noted proximal to this. She currently is on aspirin 325 mg once a day rather than twice a day, which was previously recommended by her orthopedist.   --------------------------------------------------------------------------------------------------  Past Medical History: 1. 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 HEART VALVE DISEASE: Echo (1/12) with mildly dilated LV, EF 45-50% with paradoxical septal motion  consistent with LBBB, moderate aortic stenosis with mean gradient 27 mmHg, trivial AI, mild to moderate mitral regurgitation with calcified mitral valve. 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 Echo (5/12): EF 55-60%, basal inferior hypokinesis, moderate diastolic dysfunction, normally functioning bioprosthetic aortic valve with mean gradient 13 mmHg, normal RV. Echo (9/14) with EF 60-65%, bioprosthetic aortic valve with mean gradient 16 mmHg, no AI, mild-moderate MR. Echo (9/16) with EF 55-60%, bioprosthetic aortic valve normal.  4. LBBB 5. ALLERGIC RHINITIS  6. GERD 7. Hx of DIVERTICULITIS 8. Chronic low back pain with history of back surgery 9. Appendectomy 10. Hysterectomy 11. Hyperlipidemia: Myalgias with atorvastatin and Crestor.  12. CAD: Adenosine myoview (2/12) with EF 56%, ischemia with scar in the mid to apical anterior wall, septal wall, and apex. LHC (3/12) with EF 55%, 40% ostial RCA, 60% mid RCA, 95% distal RCA. Intervention attempted but unable to adequately seat catheter. Patient had CABG-AVR in 3/12 with RIMA-RCA.  13. Carotid dopplers (3/12): no significant stenosis. Carotid dopplers (9/14) with 40-59% bilateral ICA stenosis. Carotid dopplers (9/15) with 40-59% bilateral ICA stenosis. Carotid dopplers (9/16) with 1-39% BICA stenosis, >50% left subclavian stenosis.  14. Vertigo: labyrinthitis  15. Plantar fasciitis  Past medical and surgical history were reviewed and updated in EPIC.  Current Meds  Medication Sig  . aspirin 325 MG tablet Take 1 tablet (325 mg total) by mouth 2 (two) times daily.  Marland Kitchen aspirin 325 MG tablet Take 325 mg by mouth daily.  Marland Kitchen azithromycin (ZITHROMAX) 500 MG tablet TAKE 1 500 MG TABLET 1 HR PRIOR TO DENTAL WORK  . carvedilol (COREG) 6.25 MG tablet Take 1 tablet (6.25 mg total) by  mouth 2 (two) times daily with a meal.  . cetirizine (ZYRTEC) 10 MG tablet Take 10 mg by mouth daily. Alternates every 3 months with Claritin.  .  fluticasone (FLONASE) 50 MCG/ACT nasal spray PLACE 1 SPRAY INTO BOTH NOSTRILS DAILY.  . furosemide (LASIX) 20 MG tablet TAKE 1 TABLET EVERY DAY  FOR  FLUID  RETENTION  . loratadine (CLARITIN) 10 MG tablet Take 10 mg by mouth daily. Alternates every 3 months with Zyrtec.  . Omega-3 Fatty Acids (FISH OIL) 1000 MG CAPS Take 2 capsules by mouth 2 (two) times daily.  . potassium chloride (K-DUR,KLOR-CON) 10 MEQ tablet Take 1 tablet (10 mEq total) by mouth daily.  . ranitidine (ZANTAC) 150 MG tablet Take 150 mg by mouth daily as needed. For reflux  . Scar Treatment Products ([place] SPF 30) CREA Apply 1 Act topically daily.  . simvastatin (ZOCOR) 40 MG tablet Take 1 tablet (40 mg total) by mouth at bedtime.    Allergies: Tape; Amoxicillin; Crestor [rosuvastatin]; Lipitor [atorvastatin]; Neomycin; Sulfamethoxazole-trimethoprim; and Polysporin [bacitracin-polymyxin b]  Social History   Social History  . Marital status: Married    Spouse name: N/A  . Number of children: 2  . Years of education: N/A   Occupational History  . 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 Smoking status: Former Smoker    Packs/day: 1.00    Years: 40.00    Types: Cigarettes    Quit date: 02/03/2000  . Smokeless tobacco: Former Systems developer    Types: Chew     Comment: discussed LDCT   . Alcohol use 0.0 oz/week  . Drug use: No  . Sexual activity: Not on file   Other Topics Concern  . Not on file   Social History Narrative   MARRIED   1 SON 1 DAUGHTER 2 GRANDCHILDREN   Music therapist, NOW FULL TIME CAREGIVER   LIVES IN [place], GREW UP IN [place].    Family History  Problem Relation Age of Onset  . Hypertension Mother   . Breast cancer Mother   . Hypothyroidism Mother   . Diabetes type II Mother   . Heart disease Mother   . Heart attack Father        CABG @ 4  . CAD Father   . Breast cancer Sister        x 2  . Colon cancer Maternal  Aunt   . Breast cancer Maternal Aunt   . Colon cancer Paternal Uncle   . Pancreatic cancer Other        PGA  . Breast cancer Paternal Grandmother     Review of Systems: A 12-system review of systems was performed and was negative except as noted in the HPI.  --------------------------------------------------------------------------------------------------  Physical Exam: BP 124/64   Pulse 60   Ht 5\' 3"  (1.6 m)   SpO2 97%   General:  Overweight woman, seated comfortably in a wheelchair. She is accompanied by her husband. HEENT: No conjunctival pallor or scleral icterus. Moist mucous membranes.  OP clear. Neck: Supple without lymphadenopathy, thyromegaly, JVD, or HJR. No carotid bruit. Lungs: Normal work of breathing. Clear to auscultation bilaterally without wheezes or crackles. Heart: Regular rate and rhythm without murmurs, rubs, or gallops. Non-displaced PMI. Abd: Bowel sounds present. Soft, NT/ND without hepatosplenomegaly Ext: Rigid ankle brace present on the right lower extremity. 1+ pitting edema noted in the proximal calf above ankle brace. 2+ bilateral radial and  left pedal pulses. Right pedal pulses not assessed due to ankle/foot brace in place. Skin: Warm and dry without rash.  Lab Results  Component Value Date   WBC 7.2 07/13/2016   HGB 12.1 07/13/2016   HCT 37.5 07/13/2016   MCV 92.8 07/13/2016   PLT 223 07/13/2016    Lab Results  Component Value Date   NA 136 07/13/2016   K 4.2 07/13/2016   CL 100 (L) 07/13/2016   CO2 27 07/13/2016   BUN 18 07/13/2016   CREATININE 0.85 07/13/2016   GLUCOSE 133 (H) 07/13/2016   ALT 14 07/02/2016    Lab Results  Component Value Date   CHOL 154 11/14/2015   HDL 43.90 11/14/2015   LDLCALC 60 05/09/2015   LDLDIRECT 87.0 11/14/2015   TRIG 209.0 (H) 11/14/2015   CHOLHDL 4 11/14/2015    --------------------------------------------------------------------------------------------------  ASSESSMENT AND  PLAN: Syncope Cause of episode is unclear. Given that it feelings prior to falling were similar to what she has experienced with her vertigo in the past, this may have been the underlying cause. Certainly there is some concern for arrhythmic genic event given Heather Crawford's cardiac history. However, 30 day event monitor did not show any significant arrhythmias. Her exam today is unrevealing with the exception of mild edema proximal to the right ankle/foot brace. We will repeat an echocardiogram in September to reassess her aortic valve prosthesis in light of her interval syncope. We will not make any medication changes at this time. If she has additional syncopal episodes in the future, we will consider referral to EP for consideration of implantable loop recorder.  Rheumatic heart disease status post aortic valve replacement Heather Crawford seems to be doing well. She has mild leg edema but otherwise appears euvolemic. We will continue her current medication regimen, including indefinite aspirin. We will plan to repeat an echocardiogram in September, as above.  Coronary artery disease without angina Heather Crawford has not had any symptoms to suggest worsening coronary insufficiency. We will continue with secondary prevention.  Right lower extremity swelling This may be related to dependent edema. However, given immobilization of the right leg following fibula fracture in May, DVT is a consideration. We will obtain right lower extremity venous duplex to exclude thrombus.  Follow-up: Return to clinic in 3 months.  Nelva Bush, MD 08/28/2016 12:04 PM

## 2016-08-28 NOTE — Patient Instructions (Signed)
Medication Instructions:  Take lasix (furosemide) 20 mg daily as needed for swelling or weight gain. Take KCL 10 mEq when you take lasix (furosemide)  Labwork: none  Testing/Procedures: Your physician has requested that you have an echocardiogram. Echocardiography is a painless test that uses sound waves to create images of your heart. It provides your doctor with information about the size and shape of your heart and how well your heart's chambers and valves are working. This procedure takes approximately one hour. There are no restrictions for this procedure.  [facility]  Your physician has requested that you have a right lower  extremity venous duplex. This test is an ultrasound of the veins in the leg. It looks at venous blood flow that carries blood from the heart to the leg. Allow one hour for a Lower Venous exam. Allow thirty minutes for an Upper Venous exam. There are no restrictions or special instructions.     Follow-Up: Your physician recommends that you schedule a follow-up appointment in: 3 months with Dr End.   Any Other Special Instructions Will Be Listed Below (If Applicable).     If you need a refill on your cardiac medications before your next appointment, please call your pharmacy.

## 2016-08-28 NOTE — Progress Notes (Signed)
Venous duplex negative for DVT. 

## 2016-08-29 DIAGNOSIS — S82831D Other fracture of upper and lower end of right fibula, subsequent encounter for closed fracture with routine healing: Secondary | ICD-10-CM | POA: Diagnosis not present

## 2016-09-12 DIAGNOSIS — S82831D Other fracture of upper and lower end of right fibula, subsequent encounter for closed fracture with routine healing: Secondary | ICD-10-CM | POA: Diagnosis not present

## 2016-09-28 DIAGNOSIS — S82831D Other fracture of upper and lower end of right fibula, subsequent encounter for closed fracture with routine healing: Secondary | ICD-10-CM | POA: Diagnosis not present

## 2016-10-14 ENCOUNTER — Ambulatory Visit (HOSPITAL_COMMUNITY): Payer: Medicare HMO | Attending: Cardiovascular Disease

## 2016-10-14 ENCOUNTER — Other Ambulatory Visit: Payer: Self-pay

## 2016-10-14 DIAGNOSIS — R600 Localized edema: Secondary | ICD-10-CM

## 2016-10-14 DIAGNOSIS — I420 Dilated cardiomyopathy: Secondary | ICD-10-CM | POA: Diagnosis not present

## 2016-10-14 DIAGNOSIS — Z952 Presence of prosthetic heart valve: Secondary | ICD-10-CM | POA: Diagnosis not present

## 2016-10-14 DIAGNOSIS — I051 Rheumatic mitral insufficiency: Secondary | ICD-10-CM | POA: Diagnosis not present

## 2016-10-16 DIAGNOSIS — S82831D Other fracture of upper and lower end of right fibula, subsequent encounter for closed fracture with routine healing: Secondary | ICD-10-CM | POA: Diagnosis not present

## 2016-11-07 DIAGNOSIS — S82831D Other fracture of upper and lower end of right fibula, subsequent encounter for closed fracture with routine healing: Secondary | ICD-10-CM | POA: Diagnosis not present

## 2016-11-13 ENCOUNTER — Encounter: Payer: Self-pay | Admitting: Internal Medicine

## 2016-11-13 ENCOUNTER — Ambulatory Visit (INDEPENDENT_AMBULATORY_CARE_PROVIDER_SITE_OTHER): Payer: Medicare HMO | Admitting: Internal Medicine

## 2016-11-13 VITALS — BP 140/80 | HR 62 | Ht 63.0 in | Wt 175.6 lb

## 2016-11-13 DIAGNOSIS — I099 Rheumatic heart disease, unspecified: Secondary | ICD-10-CM | POA: Diagnosis not present

## 2016-11-13 DIAGNOSIS — I2510 Atherosclerotic heart disease of native coronary artery without angina pectoris: Secondary | ICD-10-CM | POA: Diagnosis not present

## 2016-11-13 DIAGNOSIS — R55 Syncope and collapse: Secondary | ICD-10-CM | POA: Diagnosis not present

## 2016-11-13 NOTE — Patient Instructions (Signed)
Medication Instructions:  Your physician recommends that you continue on your current medications as directed. Please refer to the Current Medication list given to you today.   Labwork: None  Testing/Procedures: None  Follow-Up: Your physician wants you to follow-up in: 6 months with Dr End. (April 2019).  You will receive a reminder letter in the mail two months in advance. If you don't receive a letter, please call our office to schedule the follow-up appointment.    Any Other Special Instructions Will Be Listed Below (If Applicable).  Drink plenty of water so that you stay well hydrated.  Your physician discussed the importance of taking an antibiotic prior to any dental appointments/procedures to prevent damage to the heart valves from infection.       If you need a refill on your cardiac medications before your next appointment, please call your pharmacy.

## 2016-11-13 NOTE — Progress Notes (Signed)
Follow-up Outpatient Visit Date: 11/13/2016  Primary Care Provider: Janith Lima, MD [address] [address]  Chief Complaint: Follow-up dizziness  HPI:  Heather Crawford is a 72 y.o. year-old female with history of rheumatic fever and valvular heart disease complicated by aortic stenosis and mitral regurgitation status post bioprosthetic AVR in 2012, coronary artery disease with RIMA to RCA at the time of AVR, mild to moderate bilateral internal carotid artery stenosis, and a labyrinthitis with chronic vertigo, who presents for follow-up of dizziness and valvular heart disease. I last saw her in July, when she was recovering from a fall and right fibula fracture the setting of vertigo. At her last visit, she was recovering well.  Today, Heather Crawford reports feeling well except for some continued pain at the site of her right ankle fracture. She notes occasional orthostatic lightheadedness, which is not significant when she stands up slowly. She has also been trying to stay better hydrated. She has not passed out or fallen since our last visit. She denies chest pain, shortness of breath, palpitations, orthopnea, and PND. Heather Crawford has noted residual swelling about the right ankle. She has not been using furosemide on a regular basis, as her edema has been minimal.  --------------------------------------------------------------------------------------------------  Past Medical History: 1. 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 HEART VALVE DISEASE: Echo (1/12) with mildly dilated LV, EF 45-50% with paradoxical septal motion consistent with LBBB, moderate aortic stenosis with mean gradient 27 mmHg, trivial AI, mild to moderate mitral regurgitation with calcified mitral valve. 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 Echo (5/12): EF 55-60%, basal inferior hypokinesis, moderate diastolic dysfunction, normally functioning bioprosthetic aortic valve  with mean gradient 13 mmHg, normal RV. Echo (9/14) with EF 60-65%, bioprosthetic aortic valve with mean gradient 16 mmHg, no AI, mild-moderate MR. Echo (9/16) with EF 55-60%, bioprosthetic aortic valve normal. Echo (9/18) with LVEF 60-65% and stable mean gradient across bioprosthetic AVR since 2015. Mild MR. 4. LBBB 5. ALLERGIC RHINITIS  6. GERD 7. Hx of DIVERTICULITIS 8. Chronic low back pain with history of back surgery 9. Appendectomy 10. Hysterectomy 11. Hyperlipidemia: Myalgias with atorvastatin and Crestor.  12. CAD: Adenosine myoview (2/12) with EF 56%, ischemia with scar in the mid to apical anterior wall, septal wall, and apex. LHC (3/12) with EF 55%, 40% ostial RCA, 60% mid RCA, 95% distal RCA. Intervention attempted but unable to adequately seat catheter. Patient had CABG-AVR in 3/12 with RIMA-RCA.  13. Carotid dopplers (3/12): no significant stenosis. Carotid dopplers (9/14) with 40-59% bilateral ICA stenosis. Carotid dopplers (9/15) with 40-59% bilateral ICA stenosis. Carotid dopplers (9/16) with 1-39% BICA stenosis, >50% left subclavian stenosis.  14. Vertigo: labyrinthitis  15. Plantar fasciitis  Current Meds  Medication Sig  . aspirin 325 MG tablet Take 325 mg by mouth daily.  Marland Kitchen azithromycin (ZITHROMAX) 500 MG tablet TAKE 1 500 MG TABLET 1 HR PRIOR TO DENTAL WORK  . carvedilol (COREG) 6.25 MG tablet Take 1 tablet (6.25 mg total) by mouth 2 (two) times daily with a meal.  . cetirizine (ZYRTEC) 10 MG tablet Take 10 mg by mouth daily. Alternates every 3 months with Claritin.  . fluticasone (FLONASE) 50 MCG/ACT nasal spray PLACE 1 SPRAY INTO BOTH NOSTRILS DAILY.  . furosemide (LASIX) 20 MG tablet Take 1 tablet by mouth daily as needed for edema or weight gain Take 10 mEq potassium when you take lasix  . loratadine (CLARITIN) 10 MG tablet Take 10  mg by mouth daily. Alternates every 3 months with Zyrtec.  . Omega-3 Fatty Acids (FISH OIL) 1000 MG CAPS Take 2 capsules by  mouth 2 (two) times daily.  . potassium chloride (K-DUR,KLOR-CON) 10 MEQ tablet Take 1 tablet by mouth on the days you take lasix (furosemide)  . ranitidine (ZANTAC) 150 MG tablet Take 150 mg by mouth daily as needed. For reflux  . Scar Treatment Products ([place] SPF 30) CREA Apply 1 Act topically daily.  . simvastatin (ZOCOR) 40 MG tablet Take 1 tablet (40 mg total) by mouth at bedtime.    Allergies: Tape; Amoxicillin; Crestor [rosuvastatin]; Lipitor [atorvastatin]; Neomycin; Sulfamethoxazole-trimethoprim; and Polysporin [bacitracin-polymyxin b]  Social History   Social History  . Marital status: Married    Spouse name: N/A  . Number of children: 2  . Years of education: N/A   Occupational History  . 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 Smoking status: Former Smoker    Packs/day: 1.00    Years: 40.00    Types: Cigarettes    Quit date: 02/03/2000  . Smokeless tobacco: Former Systems developer    Types: Chew     Comment: discussed LDCT   . Alcohol use 0.0 oz/week  . Drug use: No  . Sexual activity: Not on file   Other Topics Concern  . Not on file   Social History Narrative   MARRIED   1 SON 1 DAUGHTER 2 GRANDCHILDREN   Music therapist, NOW FULL TIME CAREGIVER   LIVES IN [place], GREW UP IN [place].    Family History  Problem Relation Age of Onset  . Hypertension Mother   . Breast cancer Mother   . Hypothyroidism Mother   . Diabetes type II Mother   . Heart disease Mother   . Heart attack Father        CABG @ 83  . CAD Father   . Breast cancer Sister        x 2  . Colon cancer Maternal Aunt   . Breast cancer Maternal Aunt   . Colon cancer Paternal Uncle   . Pancreatic cancer Other        PGA  . Breast cancer Paternal Grandmother     Review of Systems: A 12-system review of systems was performed and was negative except as noted in the  HPI.  --------------------------------------------------------------------------------------------------  Physical Exam: BP 140/80   Pulse 62   Ht _0  (1.6 m)   Wt 175 lb 9.6 oz (79.7 kg)   SpO2 96%   BMI 31.11 kg/m   General:  Obese woman, seated comfortably in the exam room. HEENT: No conjunctival pallor or scleral icterus. Moist mucous membranes.  OP clear. Neck: Supple without lymphadenopathy, thyromegaly, JVD, or HJR. Lungs: Normal work of breathing. Clear to auscultation bilaterally without wheezes or crackles. Heart: Regular rate and rhythm with normal K4/Y1. 2/6 systolic murmur loudest at the RUSB. No rubs or gallops.. Non-displaced PMI. Abd: Bowel sounds present. Soft, NT/ND without hepatosplenomegaly Ext: Trace right ankle edema. No significant pretibial or left lower extremity edema. Radial, PT, and DP pulses are 2+ bilaterally. Skin: Warm and dry without rash.  Lab Results  Component Value Date   WBC 7.2 07/13/2016   HGB 12.1 07/13/2016   HCT 37.5 07/13/2016   MCV 92.8 07/13/2016   PLT 223 07/13/2016    Lab Results  Component Value Date   NA 136 07/13/2016   K  4.2 07/13/2016   CL 100 (L) 07/13/2016   CO2 27 07/13/2016   BUN 18 07/13/2016   CREATININE 0.85 07/13/2016   GLUCOSE 133 (H) 07/13/2016   ALT 14 07/02/2016    Lab Results  Component Value Date   CHOL 154 11/14/2015   HDL 43.90 11/14/2015   LDLCALC 60 05/09/2015   LDLDIRECT 87.0 11/14/2015   TRIG 209.0 (H) 11/14/2015   CHOLHDL 4 11/14/2015    --------------------------------------------------------------------------------------------------  ASSESSMENT AND PLAN: Syncope No recurrent episodes. Heather Crawford notes occasional orthostatic lightheadedness, which has been less significant since she began standing up more slowly and increasing her water intake. I encouraged her to continue with these measures, as well as to consider wearing compression stockings if she is going to be standing for  extended periods. No further workup at this time, as recent echo was stable.  Rheumatic heart disease No new symptoms since our last visit. Echo last month showed normal LVEF with stable bioprosthetic AVR function. Significant MAC was noted with somewhat elevated mean gradient. We will continue with medical therapy and clinical surveillance. I reminded Heather Crawford about the importance of using prophylactic antibiotics before any dental procedure.  Coronary artery disease without angina No symptoms to suggest worsening CAD. We will continue with secondary prevention.  Follow-up: Return to clinic in 6 months.  Nelva Bush, MD 11/14/2016 9:38 PM

## 2017-01-30 DIAGNOSIS — B029 Zoster without complications: Secondary | ICD-10-CM | POA: Diagnosis not present

## 2017-02-05 NOTE — Progress Notes (Addendum)
Subjective:   Heather Crawford is a 72 y.o. female who presents for Medicare Annual (Subsequent) preventive examination.  Review of Systems:  No ROS.  Medicare Wellness Visit. Additional risk factors are reflected in the social history.  Cardiac Risk Factors include: advanced age (>78men, >53 women);diabetes mellitus;dyslipidemia;hypertension Sleep patterns: gets up 1 times nightly to void and sleeps 6-7 hours nightly.   Home Safety/Smoke Alarms: Feels safe in home. Smoke alarms in place.  Living environment; residence and Firearm Safety: 1-story house/ trailer, no firearms. Lives with husband, no needs for DME, good support system Seat Belt Safety/Bike Helmet: Wears seat belt.     Objective:     Vitals: BP 136/72   Pulse 60   Resp 20   Ht 5\' 3"  (1.6 m)   Wt 180 lb (81.6 kg)   SpO2 98%   BMI 31.89 kg/m   Body mass index is 31.89 kg/m.  Advanced Directives 02/08/2017 07/13/2016 11/16/2015 11/16/2015 10/22/2015 04/25/2015 10/17/2014  Does Patient Have a Medical Advance Directive? Yes Yes No No Yes Yes Yes  Type of Paramedic of [place];Living will [place];Living will - - - - Press photographer;Living will  Does patient want to make changes to medical advance directive? - - - - - - No - Patient declined  Copy of [place] in Chart? No - copy requested - - - - - Yes  Would patient like information on creating a medical advance directive? - - No - patient declined information No - patient declined information - - -  Pre-existing out of facility DNR order (yellow form or pink MOST form) - - - - - - -    Tobacco Social History   Tobacco Use  Smoking Status Former Smoker  . Packs/day: 1.00  . Years: 40.00  . Pack years: 40.00  . Types: Cigarettes  . Last attempt to quit: 02/03/2000  . Years since quitting: 17.0  Smokeless Tobacco Former Systems developer  . Types: Chew  Tobacco Comment   discussed LDCT        Counseling given: Not Answered Comment: discussed LDCT   Past Medical History:  Diagnosis Date  . Allergic rhinitis, cause unspecified   . Aortic stenosis    a.  echo 1/12 w/mildly dilated LV, EF 45-50% w/paradoxical septal motion consistent w/ LBBB, moderate aortic stenosis w/mean gradient 27 mmHg, trivial AI, mild to moderate MR w/calcified mitral valve;   b. s/p AVR with 21 mm Magna Ease valve  . Aortic valve replaced 2012  . Arthritis    joint pain  . CAD (coronary artery disease)    a. s/p CABG 4/12: RIMA-RCA  . CAD, multiple vessel    sees Dr Aundra Dubin every 3-4 months  . Cancer ([place])    skin sarcomas removed  . CHF (congestive heart failure) (HCC)    chronic diastolic CHF  . Chronic back pain    H/O BACK SURGERY  . Coronary artery disease    adenosine myoview 2/12 w/EF 56%, ISCHEMIA W/SCAR IN THE MID TO APICAL ANTERIO WALL, SEPTAL WALL, AND APEX. LHC 3/12 W/EF 55%, 40% OSTIAL RCA, 60% MID RCA, 95% DISTAL RC A, INTERVENTION  ATTEMPTED BUT UNABLE TO ADEQUADATELY SEAT  CATHETER.  . Diverticulitis of colon (without mention of hemorrhage)(562.11)   . Diverticulosis   . Esophageal reflux   . Glucose intolerance (impaired glucose tolerance)    A1C 6.3 (4/12)  . H/O hiatal hernia   . H/O vertigo  with fluid  in ears  . Heart murmur   . Hemorrhoids   . Hemorrhoids   . History of rheumatic fever   . Hx of hysterectomy   . Hx: UTI (urinary tract infection)   . Hyperlipidemia   . LBBB (left bundle branch block)   . Myocardial infarction ([place]) 2002  . Pneumonia   . PONV (postoperative nausea and vomiting)   . 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 Snoring   . Urinary frequency    Past Surgical History:  Procedure Laterality Date  . ABDOMINAL HYSTERECTOMY    . APPENDECTOMY  1973  . BACK SURGERY     Lumbar Lam and discectomy x 2  . BREAST BIOPSY Right 10/2011  . BREAST LUMPECTOMY Right   . CARDIAC CATHETERIZATION  2012  . CARDIAC VALVE REPLACEMENT  2012   Aortic  . CORONARY  ARTERY BYPASS GRAFT  2012  . DILATION AND CURETTAGE OF UTERUS    . ENDOMETRIAL ABLATION    . LAPAROSCOPIC LYSIS INTESTINAL ADHESIONS    . LUMBAR [place]  . SKIN SURGERY     skin sarcomas removed  . TONSILLECTOMY     1955  . TONSILLECTOMY     Family History  Problem Relation Age of Onset  . Hypertension Mother   . Breast cancer Mother   . Hypothyroidism Mother   . Diabetes type II Mother   . Heart disease Mother   . Heart attack Father        CABG @ 19  . CAD Father   . Breast cancer Sister        x 2  . Colon cancer Maternal Aunt   . Breast cancer Maternal Aunt   . Colon cancer Paternal Uncle   . Pancreatic cancer Other        PGA  . Breast cancer Paternal Grandmother    Social History   Socioeconomic History  . Marital status: Married    Spouse name: None  . Number of children: 2  . Years of education: None  . Highest education level: None  Social Needs  . Financial resource strain: None  . Food insecurity - worry: Never true  . Food insecurity - inability: Never true  . Transportation needs - medical: Yes  . Transportation needs - non-medical: None  Occupational History  . Occupation: retired    Fish farm manager: RETIRED    Comment: [employer], TEFL teacher, CERTIFIED AS SPECIAL ED TEACHER  Tobacco Use  . Smoking status: Former Smoker    Packs/day: 1.00    Years: 40.00    Pack years: 40.00    Types: Cigarettes    Last attempt to quit: 02/03/2000    Years since quitting: 17.0  . Smokeless tobacco: Former Systems developer    Types: Chew  . Tobacco comment: discussed LDCT   Substance and Sexual Activity  . Alcohol use: No    Alcohol/week: 0.0 oz    Frequency: Never  . Drug use: No  . Sexual activity: Yes  Other Topics Concern  . None  Social History Narrative   MARRIED   1 SON 1 DAUGHTER 2 GRANDCHILDREN   Music therapist, NOW FULL TIME CAREGIVER   LIVES IN [place], GREW UP IN [place].    Outpatient Encounter Medications as of  02/08/2017  Medication Sig  . aspirin 325 MG tablet Take 325 mg by mouth daily.  Marland Kitchen azithromycin (ZITHROMAX) 500 MG tablet TAKE 1 500  MG TABLET 1 HR PRIOR TO DENTAL WORK  . carvedilol (COREG) 6.25 MG tablet Take 1 tablet (6.25 mg total) by mouth 2 (two) times daily with a meal.  . cetirizine (ZYRTEC) 10 MG tablet Take 10 mg by mouth daily. Alternates every 3 months with Claritin.  . fluticasone (FLONASE) 50 MCG/ACT nasal spray PLACE 1 SPRAY INTO BOTH NOSTRILS DAILY.  . furosemide (LASIX) 20 MG tablet Take 1 tablet by mouth daily as needed for edema or weight gain Take 10 mEq potassium when you take lasix  . loratadine (CLARITIN) 10 MG tablet Take 10 mg by mouth daily. Alternates every 3 months with Zyrtec.  . potassium chloride (K-DUR,KLOR-CON) 10 MEQ tablet Take 1 tablet by mouth on the days you take lasix (furosemide)  . ranitidine (ZANTAC) 150 MG tablet Take 150 mg by mouth daily as needed. For reflux  . simvastatin (ZOCOR) 40 MG tablet Take 1 tablet (40 mg total) by mouth at bedtime.  . [DISCONTINUED] Omega-3 Fatty Acids (FISH OIL) 1000 MG CAPS Take 2 capsules by mouth 2 (two) times daily.  . [DISCONTINUED] Scar Treatment Products ([place] SPF 30) CREA Apply 1 Act topically daily. (Patient not taking: Reported on 02/08/2017)   No facility-administered encounter medications on file as of 02/08/2017.     Activities of Daily Living In your present state of health, do you have any difficulty performing the following activities: 02/08/2017  Hearing? N  Vision? N  Difficulty concentrating or making decisions? N  Walking or climbing stairs? N  Dressing or bathing? N  Doing errands, shopping? N  Preparing Food and eating ? N  Using the Toilet? N  In the past six months, have you accidently leaked urine? N  Do you have problems with loss of bowel control? N  Managing your Medications? N  Managing your Finances? N  Housekeeping or managing your Housekeeping? N  Some recent data might be hidden     Patient Care Team: Janith Lima, MD as PCP - General (Internal Medicine) Melrose Nakayama, MD (Cardiothoracic Surgery) End, Harrell Gave, MD as Consulting Physician (Cardiology) Latanya Maudlin, MD as Consulting Physician (Orthopedic Surgery)    Assessment:   This is a routine wellness examination for Heather Crawford. Physical assessment deferred to PCP.   Exercise Activities and Dietary recommendations Current Exercise Habits: The patient does not participate in regular exercise at present, Exercise limited by: orthopedic condition(s)(recent fractured right ankle) Diet (meal preparation, eat out, water intake, caffeinated beverages, dairy products, fruits and vegetables): in general, a "healthy" diet  , well balanced   Reviewed heart healthy and diabetic diet, encouraged patient to increase daily water intake.   Goals    . Exercise 150 minutes per week (moderate activity)     Will start walking x 30 minutes 3 days a week  Will build up to this time and then move on.  Keep journal    . Patient Stated     Lose weight, I will watch what I eat by lowering sugar and carbohydrates, start to exercise, and join [place].    . Weight (lb) < 160 lb (72.6 kg)     Will try again to lose some weight;  Will drink more water       Fall Risk Fall Risk  02/08/2017 11/16/2015 10/22/2015 10/17/2014 10/11/2014  Falls in the past year? Yes No No No No  Number falls in past yr: 1 - - - -    Depression Screen PHQ 2/9 Scores 02/08/2017 11/16/2015 10/22/2015  10/17/2014  PHQ - 2 Score 0 0 0 0  PHQ- 9 Score 1 - - -     Cognitive Function MMSE - Mini Mental State Exam 02/08/2017 10/22/2015 10/11/2014  Not completed: - (No Data) Unable to complete  Orientation to time 5 - -  Orientation to Place 5 - -  Registration 3 - -  Attention/ Calculation 5 - -  Recall 2 - -  Language- name 2 objects 2 - -  Language- repeat 1 - -  Language- follow 3 step command 3 - -  Language- read & follow  direction 1 - -  Write a sentence 1 - -  Copy design 1 - -  Total score 29 - -        Immunization History  Administered Date(s) Administered  . Hep A / Hep B 10/17/2014, 04/16/2015  . Hepatitis B, adult 11/16/2014  . Pneumococcal Conjugate-13 08/16/2013  . Pneumococcal Polysaccharide-23 08/12/2011  . Td 02/21/2010  . Tdap 02/21/2010    Screening Tests Health Maintenance  Topic Date Due  . FOOT EXAM  11/13/2016  . URINE MICROALBUMIN  11/13/2016  . HEMOGLOBIN A1C  01/01/2017  . INFLUENZA VACCINE  10/11/2017 (Originally 09/02/2016)  . OPHTHALMOLOGY EXAM  10/03/2017  . MAMMOGRAM  11/13/2017  . TETANUS/TDAP  02/22/2020  . COLONOSCOPY  04/29/2020  . DEXA SCAN  Completed  . Hepatitis C Screening  Completed  . PNA vac Low Risk Adult  Completed      Plan:    Continue doing brain stimulating activities (puzzles, reading, adult coloring books, staying active) to keep memory sharp.   Continue to eat heart healthy diet (full of fruits, vegetables, whole grains, lean protein, water--limit salt, fat, and sugar intake) and increase physical activity as tolerated.  I have personally reviewed and noted the following in the patient's chart:   . Medical and social history . Use of alcohol, tobacco or illicit drugs  . Current medications and supplements . Functional ability and status . Nutritional status . Physical activity . Advanced directives . List of other physicians . Vitals . Screenings to include cognitive, depression, and falls . Referrals and appointments  In addition, I have reviewed and discussed with patient certain preventive protocols, quality metrics, and best practice recommendations. A written personalized care plan for preventive services as well as general preventive health recommendations were provided to patient.     Michiel Cowboy, RN  02/08/2017   Medical screening examination/treatment/procedure(s) were performed by non-physician practitioner and as  supervising physician I was immediately available for consultation/collaboration. I agree with above. Scarlette Calico, MD

## 2017-02-08 ENCOUNTER — Ambulatory Visit (INDEPENDENT_AMBULATORY_CARE_PROVIDER_SITE_OTHER): Payer: Medicare HMO | Admitting: *Deleted

## 2017-02-08 VITALS — BP 136/72 | HR 60 | Resp 20 | Ht 63.0 in | Wt 180.0 lb

## 2017-02-08 DIAGNOSIS — Z0000 Encounter for general adult medical examination without abnormal findings: Secondary | ICD-10-CM

## 2017-02-08 DIAGNOSIS — E118 Type 2 diabetes mellitus with unspecified complications: Secondary | ICD-10-CM

## 2017-02-08 NOTE — Patient Instructions (Addendum)
Continue doing brain stimulating activities (puzzles, reading, adult coloring books, staying active) to keep memory sharp.   Continue to eat heart healthy diet (full of fruits, vegetables, whole grains, lean protein, water--limit salt, fat, and sugar intake) and increase physical activity as tolerated.   Heather Crawford , Thank you for taking time to come for your Medicare Wellness Visit. I appreciate your ongoing commitment to your health goals. Please review the following plan we discussed and let me know if I can assist you in the future.   These are the goals we discussed: Goals    . Exercise 150 minutes per week (moderate activity)     Will start walking x 30 minutes 3 days a week  Will build up to this time and then move on.  Keep journal    . Patient Stated     Lose weight, I will watch what I eat by lowering sugar and carbohydrates, start to exercise, and join [place].    . Weight (lb) < 160 lb (72.6 kg)     Will try again to lose some weight;  Will drink more water       This is a list of the screening recommended for you and due dates:  Health Maintenance  Topic Date Due  . Complete foot exam   11/13/2016  . Urine Protein Check  11/13/2016  . Hemoglobin A1C  01/01/2017  . Flu Shot  10/11/2017*  . Eye exam for diabetics  10/03/2017  . Mammogram  11/13/2017  . Tetanus Vaccine  02/22/2020  . Colon Cancer Screening  04/29/2020  . DEXA scan (bone density measurement)  Completed  .  Hepatitis C: One time screening is recommended by Center for Disease Control  (CDC) for  adults born from [birth year] through 1967.   Completed  . Pneumonia vaccines  Completed  *Topic was postponed. The date shown is not the original due date.

## 2017-02-09 ENCOUNTER — Ambulatory Visit: Payer: Medicare HMO

## 2017-02-09 DIAGNOSIS — Z1231 Encounter for screening mammogram for malignant neoplasm of breast: Secondary | ICD-10-CM | POA: Diagnosis not present

## 2017-02-09 DIAGNOSIS — Z803 Family history of malignant neoplasm of breast: Secondary | ICD-10-CM | POA: Diagnosis not present

## 2017-02-09 DIAGNOSIS — M85851 Other specified disorders of bone density and structure, right thigh: Secondary | ICD-10-CM | POA: Diagnosis not present

## 2017-02-09 LAB — HM MAMMOGRAPHY

## 2017-02-09 LAB — HM DEXA SCAN

## 2017-02-11 ENCOUNTER — Encounter: Payer: Self-pay | Admitting: Internal Medicine

## 2017-02-12 ENCOUNTER — Other Ambulatory Visit: Payer: Self-pay | Admitting: Internal Medicine

## 2017-02-12 DIAGNOSIS — Z952 Presence of prosthetic heart valve: Secondary | ICD-10-CM

## 2017-02-12 DIAGNOSIS — R600 Localized edema: Secondary | ICD-10-CM

## 2017-02-12 NOTE — Telephone Encounter (Signed)
Please review for refill, Thanks !  

## 2017-02-15 DIAGNOSIS — Z85828 Personal history of other malignant neoplasm of skin: Secondary | ICD-10-CM | POA: Diagnosis not present

## 2017-02-15 DIAGNOSIS — L570 Actinic keratosis: Secondary | ICD-10-CM | POA: Diagnosis not present

## 2017-02-15 DIAGNOSIS — Z08 Encounter for follow-up examination after completed treatment for malignant neoplasm: Secondary | ICD-10-CM | POA: Diagnosis not present

## 2017-02-17 ENCOUNTER — Other Ambulatory Visit (INDEPENDENT_AMBULATORY_CARE_PROVIDER_SITE_OTHER): Payer: Medicare HMO

## 2017-02-17 ENCOUNTER — Encounter: Payer: Self-pay | Admitting: Internal Medicine

## 2017-02-17 ENCOUNTER — Ambulatory Visit (INDEPENDENT_AMBULATORY_CARE_PROVIDER_SITE_OTHER): Payer: Medicare HMO | Admitting: Internal Medicine

## 2017-02-17 ENCOUNTER — Encounter: Payer: Medicare HMO | Admitting: Internal Medicine

## 2017-02-17 VITALS — BP 140/78 | HR 64 | Temp 97.9°F | Resp 16 | Ht 62.0 in | Wt 180.0 lb

## 2017-02-17 DIAGNOSIS — E118 Type 2 diabetes mellitus with unspecified complications: Secondary | ICD-10-CM

## 2017-02-17 DIAGNOSIS — Z23 Encounter for immunization: Secondary | ICD-10-CM

## 2017-02-17 DIAGNOSIS — E781 Pure hyperglyceridemia: Secondary | ICD-10-CM

## 2017-02-17 DIAGNOSIS — R600 Localized edema: Secondary | ICD-10-CM

## 2017-02-17 DIAGNOSIS — K219 Gastro-esophageal reflux disease without esophagitis: Secondary | ICD-10-CM

## 2017-02-17 DIAGNOSIS — E785 Hyperlipidemia, unspecified: Secondary | ICD-10-CM | POA: Diagnosis not present

## 2017-02-17 DIAGNOSIS — I10 Essential (primary) hypertension: Secondary | ICD-10-CM | POA: Diagnosis not present

## 2017-02-17 DIAGNOSIS — M8580 Other specified disorders of bone density and structure, unspecified site: Secondary | ICD-10-CM | POA: Diagnosis not present

## 2017-02-17 DIAGNOSIS — Z952 Presence of prosthetic heart valve: Secondary | ICD-10-CM

## 2017-02-17 DIAGNOSIS — I5032 Chronic diastolic (congestive) heart failure: Secondary | ICD-10-CM

## 2017-02-17 DIAGNOSIS — M839 Adult osteomalacia, unspecified: Secondary | ICD-10-CM | POA: Diagnosis not present

## 2017-02-17 DIAGNOSIS — I2510 Atherosclerotic heart disease of native coronary artery without angina pectoris: Secondary | ICD-10-CM

## 2017-02-17 LAB — CBC WITH DIFFERENTIAL/PLATELET
Basophils Absolute: 0.1 10*3/uL (ref 0.0–0.1)
Basophils Relative: 1.2 % (ref 0.0–3.0)
EOS ABS: 0.1 10*3/uL (ref 0.0–0.7)
EOS PCT: 1 % (ref 0.0–5.0)
HEMATOCRIT: 38.5 % (ref 36.0–46.0)
Hemoglobin: 12.8 g/dL (ref 12.0–15.0)
LYMPHS PCT: 31.8 % (ref 12.0–46.0)
Lymphs Abs: 2.2 10*3/uL (ref 0.7–4.0)
MCHC: 33.1 g/dL (ref 30.0–36.0)
MCV: 92.7 fl (ref 78.0–100.0)
MONO ABS: 0.5 10*3/uL (ref 0.1–1.0)
Monocytes Relative: 7.6 % (ref 3.0–12.0)
Neutro Abs: 4.1 10*3/uL (ref 1.4–7.7)
Neutrophils Relative %: 58.4 % (ref 43.0–77.0)
Platelets: 242 10*3/uL (ref 150.0–400.0)
RBC: 4.16 Mil/uL (ref 3.87–5.11)
RDW: 13.7 % (ref 11.5–15.5)
WBC: 7 10*3/uL (ref 4.0–10.5)

## 2017-02-17 LAB — VITAMIN D 25 HYDROXY (VIT D DEFICIENCY, FRACTURES): VITD: 11.21 ng/mL — AB (ref 30.00–100.00)

## 2017-02-17 LAB — URINALYSIS, ROUTINE W REFLEX MICROSCOPIC
BILIRUBIN URINE: NEGATIVE
Hgb urine dipstick: NEGATIVE
Ketones, ur: NEGATIVE
Nitrite: NEGATIVE
PH: 6 (ref 5.0–8.0)
RBC / HPF: NONE SEEN (ref 0–?)
Specific Gravity, Urine: <=1.005 — AB (ref 1.000–1.030)
TOTAL PROTEIN, URINE-UPE24: NEGATIVE
Urine Glucose: NEGATIVE
Urobilinogen, UA: 0.2 (ref 0.0–1.0)

## 2017-02-17 LAB — COMPREHENSIVE METABOLIC PANEL
ALK PHOS: 47 U/L (ref 39–117)
ALT: 17 U/L (ref 0–35)
AST: 18 U/L (ref 0–37)
Albumin: 4.4 g/dL (ref 3.5–5.2)
BILIRUBIN TOTAL: 0.4 mg/dL (ref 0.2–1.2)
BUN: 12 mg/dL (ref 6–23)
CALCIUM: 9.2 mg/dL (ref 8.4–10.5)
CO2: 30 mEq/L (ref 19–32)
CREATININE: 0.61 mg/dL (ref 0.40–1.20)
Chloride: 101 mEq/L (ref 96–112)
GFR: 102.03 mL/min (ref >60.00)
Glucose, Bld: 121 mg/dL — ABNORMAL HIGH (ref 70–99)
Potassium: 4.4 mEq/L (ref 3.5–5.1)
SODIUM: 138 meq/L (ref 135–145)
TOTAL PROTEIN: 7.1 g/dL (ref 6.0–8.3)

## 2017-02-17 LAB — HEMOGLOBIN A1C: HEMOGLOBIN A1C: 7.4 % — AB (ref 4.6–6.5)

## 2017-02-17 LAB — LIPID PANEL
CHOLESTEROL: 164 mg/dL (ref 0–200)
HDL: 39.7 mg/dL (ref >39.00)
NonHDL: 123.95
TRIGLYCERIDES: 372 mg/dL — AB (ref 0.0–149.0)
Total CHOL/HDL Ratio: 4
VLDL: 74.4 mg/dL — ABNORMAL HIGH (ref 0.0–40.0)

## 2017-02-17 LAB — MICROALBUMIN / CREATININE URINE RATIO
CREATININE, U: 26.5 mg/dL
MICROALB/CREAT RATIO: 2.6 mg/g (ref 0.0–30.0)

## 2017-02-17 LAB — LDL CHOLESTEROL, DIRECT: Direct LDL: 101 mg/dL

## 2017-02-17 LAB — TSH: TSH: 5.07 u[IU]/mL — ABNORMAL HIGH (ref 0.35–4.50)

## 2017-02-17 MED ORDER — CHOLECALCIFEROL 50 MCG (2000 UT) PO TABS: 1.0000 | ORAL_TABLET | Freq: Every day | ORAL | 1 refills | Status: DC

## 2017-02-17 MED ORDER — ZOSTER VAC RECOMB ADJUVANTED 50 MCG/0.5ML IM SUSR: 0.5000 mL | Freq: Once | INTRAMUSCULAR | 1 refills | Status: AC

## 2017-02-17 MED ORDER — FOSTEUM PLUS PO CAPS: 1.0000 | ORAL_CAPSULE | Freq: Two times a day (BID) | ORAL | 11 refills | Status: DC

## 2017-02-17 NOTE — Progress Notes (Signed)
Subjective:  Patient ID: Heather Crawford, female    DOB: 12-12-1945  Age: 72 y.o. MRN: [id number]  CC: Hypertension; Diabetes; and Hyperlipidemia   HPI [name] presents for f/up - she complains of wt gain.  She recently had a DEXA scan done and her T score was -1.5.  She does not want to take a medication to treat this.  She thinks her blood sugar and her blood pressure have been well controlled.  She denies any recent polys.  She denies CP, DOE, palpitations, edema, or fatigue.  Outpatient Medications Prior to Visit  Medication Sig Dispense Refill  . aspirin 325 MG tablet Take 325 mg by mouth daily.    Marland Kitchen azithromycin (ZITHROMAX) 500 MG tablet TAKE 1 500 MG TABLET 1 HR PRIOR TO DENTAL WORK 1 tablet 0  . carvedilol (COREG) 6.25 MG tablet TAKE 1 TABLET TWICE DAILY WITH A MEAL 180 tablet 2  . cetirizine (ZYRTEC) 10 MG tablet Take 10 mg by mouth daily. Alternates every 3 months with Claritin.    . fluticasone (FLONASE) 50 MCG/ACT nasal spray PLACE 1 SPRAY INTO BOTH NOSTRILS DAILY. 32 g 3  . furosemide (LASIX) 20 MG tablet Take 1 tablet by mouth daily as needed for edema or weight gain Take 10 mEq potassium when you take lasix 30 tablet 2  . loratadine (CLARITIN) 10 MG tablet Take 10 mg by mouth daily. Alternates every 3 months with Zyrtec.    . potassium chloride (K-DUR,KLOR-CON) 10 MEQ tablet Take 1 tablet by mouth on the days you take lasix (furosemide) 30 tablet 2  . ranitidine (ZANTAC) 150 MG tablet Take 150 mg by mouth daily as needed. For reflux    . simvastatin (ZOCOR) 40 MG tablet Take 1 tablet (40 mg total) by mouth at bedtime. 90 tablet 1   No facility-administered medications prior to visit.     ROS Review of Systems  Constitutional: Positive for unexpected weight change. Negative for diaphoresis and fatigue.  HENT: Negative.  Negative for trouble swallowing.   Eyes: Negative for visual disturbance.  Respiratory: Negative for cough, chest tightness, shortness of  breath and wheezing.   Cardiovascular: Negative for chest pain, palpitations and leg swelling.  Gastrointestinal: Negative for abdominal pain, constipation, diarrhea, nausea and vomiting.  Endocrine: Negative.  Negative for cold intolerance, heat intolerance, polydipsia, polyphagia and polyuria.  Genitourinary: Negative for difficulty urinating, dysuria, frequency and urgency.  Musculoskeletal: Negative.  Negative for back pain and myalgias.  Skin: Negative.   Allergic/Immunologic: Negative.   Neurological: Negative.  Negative for dizziness, weakness and light-headedness.  Hematological: Negative for adenopathy. Does not bruise/bleed easily.  Psychiatric/Behavioral: Negative.     Objective:  BP 140/78   Pulse 64   Temp 97.9 F (36.6 C) (Oral)   Resp 16   Ht 5\' 2"  (1.575 m)   Wt 180 lb (81.6 kg)   SpO2 96%   BMI 32.92 kg/m   BP Readings from Last 3 Encounters:  02/17/17 140/78  02/08/17 136/72  11/13/16 140/80    Wt Readings from Last 3 Encounters:  02/17/17 180 lb (81.6 kg)  02/08/17 180 lb (81.6 kg)  11/13/16 175 lb 9.6 oz (79.7 kg)    Physical Exam  Constitutional: She is oriented to person, place, and time. No distress.  HENT:  Mouth/Throat: Oropharynx is clear and moist. No oropharyngeal exudate.  Eyes: Conjunctivae are normal. Left eye exhibits no discharge. No scleral icterus.  Neck: Normal range of motion. Neck supple. No JVD  present. No thyromegaly present.  Cardiovascular: Normal rate, regular rhythm, normal heart sounds and intact distal pulses.  No murmur heard. Pulmonary/Chest: Effort normal and breath sounds normal. No respiratory distress. She has no wheezes. She has no rales.  Abdominal: Soft. Bowel sounds are normal. She exhibits no mass. There is no tenderness.  Musculoskeletal: Normal range of motion. She exhibits no edema, tenderness or deformity.  Lymphadenopathy:    She has no cervical adenopathy.  Neurological: She is alert and oriented to  person, place, and time.  Skin: Skin is warm and dry. No rash noted. She is not diaphoretic. No erythema. No pallor.  Vitals reviewed.   Lab Results  Component Value Date   WBC 7.0 02/17/2017   HGB 12.8 02/17/2017   HCT 38.5 02/17/2017   PLT 242.0 02/17/2017   GLUCOSE 121 (H) 02/17/2017   CHOL 164 02/17/2017   TRIG 372.0 (H) 02/17/2017   HDL 39.70 02/17/2017   LDLDIRECT 101.0 02/17/2017   LDLCALC 60 05/09/2015   ALT 17 02/17/2017   AST 18 02/17/2017   NA 138 02/17/2017   K 4.4 02/17/2017   CL 101 02/17/2017   CREATININE 0.61 02/17/2017   BUN 12 02/17/2017   CO2 30 02/17/2017   TSH 5.07 (H) 02/17/2017   INR 3.1 06/18/2010   HGBA1C 7.4 (H) 02/17/2017   MICROALBUR <0.7 02/17/2017    No results found.  Assessment & Plan:   Anyi was seen today for hypertension, diabetes and hyperlipidemia.  Diagnoses and all orders for this visit:  Atherosclerosis of native coronary artery of native heart without angina pectoris- She denies any recent episodes of angina.  Will continue risk factor modification. -     Lipid panel; Future  Diastolic CHF, chronic ([place])- She has no symptoms of fluid overload and her exam today is normal.  Will continue carvedilol and the loop diuretic. -     Comprehensive metabolic panel; Future  Essential hypertension- Her blood pressure is adequately well controlled.  Electrolytes and renal function are normal. -     Comprehensive metabolic panel; Future -     Urinalysis, Routine w reflex microscopic; Future  Gastroesophageal reflux disease without esophagitis - Her symptoms are well controlled. -     CBC with Differential/Platelet; Future  Type 2 diabetes mellitus with complication, without long-term current use of insulin ([place])- Her A1c is up to 7.4%.  She is committed to better lifestyle modifications.  Blood sugars remain elevated then I will start an SGL T-2 inhibitor. -     Comprehensive metabolic panel; Future -     Hemoglobin A1c; Future -      Microalbumin / creatinine urine ratio; Future  Hyperlipidemia with target LDL less than 70-she has not achieved her LDL goal.  I have asked her to restart a statin. -     TSH; Future  Hypertriglyceridemia- Her triglycerides are up to 372.  In addition to lifestyle modifications I have asked her to start taking Vascepa for CV risk reduction and to prevent the complications of hypertriglyceridemia. -     Lipid panel; Future  Need for shingles vaccine -     Zoster Vaccine Adjuvanted [facility]) injection; Inject 0.5 mLs into the muscle once for 1 dose.  Osteopenia, unspecified location- Will start fosteum plus to strengthen her bones and improve her DEXA scan and will also treat the vitamin D deficiency. -     Dietary Management Product (FOSTEUM PLUS) CAPS; Take 1 capsule by mouth 2 (two) times daily. -  VITAMIN D 25 Hydroxy (Vit-D Deficiency, Fractures); Future -     Cholecalciferol 2000 units TABS; Take 1 tablet (2,000 Units total) by mouth daily.  Vitamin D deficient osteomalacia -     Cholecalciferol 2000 units TABS; Take 1 tablet (2,000 Units total) by mouth daily.   I am having Murvin Natal. Troost start on Zoster Vaccine Adjuvanted, FOSTEUM PLUS, Cholecalciferol, and Icosapent Ethyl. I am also having her maintain her ranitidine, loratadine, cetirizine, azithromycin, fluticasone, aspirin, furosemide, potassium chloride, carvedilol, and simvastatin.  Meds ordered this encounter  Medications  . Zoster Vaccine Adjuvanted [facility]) injection    Sig: Inject 0.5 mLs into the muscle once for 1 dose.    Dispense:  0.5 mL    Refill:  1  . Dietary Management Product (FOSTEUM PLUS) CAPS    Sig: Take 1 capsule by mouth 2 (two) times daily.    Dispense:  60 capsule    Refill:  11  . Cholecalciferol 2000 units TABS    Sig: Take 1 tablet (2,000 Units total) by mouth daily.    Dispense:  90 tablet    Refill:  1  . simvastatin (ZOCOR) 40 MG tablet    Sig: Take 1 tablet (40 mg total) by  mouth at bedtime.    Dispense:  90 tablet    Refill:  1  . Icosapent Ethyl (VASCEPA) 1 g CAPS    Sig: Take 2 capsules by mouth 2 (two) times daily.    Dispense:  360 capsule    Refill:  1     Follow-up: Return in about 6 months (around 08/17/2017).  Scarlette Calico, MD

## 2017-02-17 NOTE — Patient Instructions (Signed)

## 2017-02-18 ENCOUNTER — Encounter: Payer: Self-pay | Admitting: Internal Medicine

## 2017-02-20 MED ORDER — ICOSAPENT ETHYL 1 G PO CAPS: 2.0000 | ORAL_CAPSULE | Freq: Two times a day (BID) | ORAL | 1 refills | Status: DC

## 2017-02-20 MED ORDER — SIMVASTATIN 40 MG PO TABS: 40.0000 mg | ORAL_TABLET | Freq: Every day | ORAL | 1 refills | Status: DC

## 2017-03-05 ENCOUNTER — Encounter: Payer: Self-pay | Admitting: Internal Medicine

## 2017-04-15 ENCOUNTER — Other Ambulatory Visit: Payer: Self-pay | Admitting: Internal Medicine

## 2017-04-15 DIAGNOSIS — Z952 Presence of prosthetic heart valve: Secondary | ICD-10-CM

## 2017-04-15 DIAGNOSIS — R600 Localized edema: Secondary | ICD-10-CM

## 2017-04-15 NOTE — Telephone Encounter (Signed)
Refill Request.  

## 2017-05-21 ENCOUNTER — Encounter: Payer: Self-pay | Admitting: Internal Medicine

## 2017-05-21 ENCOUNTER — Ambulatory Visit: Payer: Medicare HMO | Admitting: Internal Medicine

## 2017-05-21 VITALS — BP 140/70 | HR 70 | Ht 62.0 in | Wt 177.8 lb

## 2017-05-21 DIAGNOSIS — E782 Mixed hyperlipidemia: Secondary | ICD-10-CM | POA: Diagnosis not present

## 2017-05-21 DIAGNOSIS — I5032 Chronic diastolic (congestive) heart failure: Secondary | ICD-10-CM | POA: Diagnosis not present

## 2017-05-21 DIAGNOSIS — I099 Rheumatic heart disease, unspecified: Secondary | ICD-10-CM | POA: Diagnosis not present

## 2017-05-21 DIAGNOSIS — I2510 Atherosclerotic heart disease of native coronary artery without angina pectoris: Secondary | ICD-10-CM

## 2017-05-21 NOTE — Patient Instructions (Addendum)
Medication Instructions:  Your physician recommends that you continue on your current medications as directed. Please refer to the Current Medication list given to you today.  -- If you need a refill on your cardiac medications before your next appointment, please call your pharmacy. --  Labwork: None ordered  Testing/Procedures: None ordered  Follow-Up: Your physician wants you to follow-up in: 6 months with Dr. Saunders Revel.    You will receive a reminder letter in the mail two months in advance. If you don't receive a letter, please call our office to schedule the follow-up appointment.  Thank you for choosing [facility]!!   IF BP IS GREATER THAN 140/80, PLEASE CALL us   Any Other Special Instructions Will Be Listed Below (If Applicable).   DASH Eating Plan DASH stands for "Dietary Approaches to Stop Hypertension." The DASH eating plan is a healthy eating plan that has been shown to reduce high blood pressure (hypertension). It may also reduce your risk for type 2 diabetes, heart disease, and stroke. The DASH eating plan may also help with weight loss. What are tips for following this plan? General guidelines  Avoid eating more than 2,300 mg (milligrams) of salt (sodium) a day. If you have hypertension, you may need to reduce your sodium intake to 1,500 mg a day.  Limit alcohol intake to no more than 1 drink a day for nonpregnant women and 2 drinks a day for men. One drink equals 12 oz of beer, 5 oz of wine, or 1 oz of hard liquor.  Work with your health care provider to maintain a healthy body weight or to lose weight. Ask what an ideal weight is for you.  Get at least 30 minutes of exercise that causes your heart to beat faster (aerobic exercise) most days of the week. Activities may include walking, swimming, or biking.  Work with your health care provider or diet and nutrition specialist (dietitian) to adjust your eating plan to your individual calorie needs. Reading food  labels  Check food labels for the amount of sodium per serving. Choose foods with less than 5 percent of the Daily Value of sodium. Generally, foods with less than 300 mg of sodium per serving fit into this eating plan.  To find whole grains, look for the word "whole" as the first word in the ingredient list. Shopping  Buy products labeled as "low-sodium" or "no salt added."  Buy fresh foods. Avoid canned foods and premade or frozen meals. Cooking  Avoid adding salt when cooking. Use salt-free seasonings or herbs instead of table salt or sea salt. Check with your health care provider or pharmacist before using salt substitutes.  Do not fry foods. Cook foods using healthy methods such as baking, boiling, grilling, and broiling instead.  Cook with heart-healthy oils, such as olive, canola, soybean, or sunflower oil. Meal planning   Eat a balanced diet that includes: ? 5 or more servings of fruits and vegetables each day. At each meal, try to fill half of your plate with fruits and vegetables. ? Up to 6-8 servings of whole grains each day. ? Less than 6 oz of lean meat, poultry, or fish each day. A 3-oz serving of meat is about the same size as a deck of cards. One egg equals 1 oz. ? 2 servings of low-fat dairy each day. ? A serving of nuts, seeds, or beans 5 times each week. ? Heart-healthy fats. Healthy fats called Omega-3 fatty acids are found in foods such as  flaxseeds and coldwater fish, like sardines, salmon, and mackerel.  Limit how much you eat of the following: ? Canned or prepackaged foods. ? Food that is high in trans fat, such as fried foods. ? Food that is high in saturated fat, such as fatty meat. ? Sweets, desserts, sugary drinks, and other foods with added sugar. ? Full-fat dairy products.  Do not salt foods before eating.  Try to eat at least 2 vegetarian meals each week.  Eat more home-cooked food and less restaurant, buffet, and fast food.  When eating at a  restaurant, ask that your food be prepared with less salt or no salt, if possible. What foods are recommended? The items listed may not be a complete list. Talk with your dietitian about what dietary choices are best for you. Grains Whole-grain or whole-wheat bread. Whole-grain or whole-wheat pasta. Brown rice. [place]. Bulgur. Whole-grain and low-sodium cereals. Pita bread. Low-fat, low-sodium crackers. Whole-wheat flour tortillas. Vegetables Fresh or frozen vegetables (raw, steamed, roasted, or grilled). Low-sodium or reduced-sodium tomato and vegetable juice. Low-sodium or reduced-sodium tomato sauce and tomato paste. Low-sodium or reduced-sodium canned vegetables. Fruits All fresh, dried, or frozen fruit. Canned fruit in natural juice (without added sugar). Meat and other protein foods Skinless chicken or [place]. Ground chicken or [place]. Pork with fat trimmed off. Fish and seafood. Egg whites. Dried beans, peas, or lentils. Unsalted nuts, nut butters, and seeds. Unsalted canned beans. Lean cuts of beef with fat trimmed off. Low-sodium, lean deli meat. Dairy Low-fat (1%) or fat-free (skim) milk. Fat-free, low-fat, or reduced-fat cheeses. Nonfat, low-sodium ricotta or cottage cheese. Low-fat or nonfat yogurt. Low-fat, low-sodium cheese. Fats and oils Soft margarine without trans fats. Vegetable oil. Low-fat, reduced-fat, or light mayonnaise and salad dressings (reduced-sodium). Canola, safflower, olive, soybean, and sunflower oils. Avocado. Seasoning and other foods Herbs. Spices. Seasoning mixes without salt. Unsalted popcorn and pretzels. Fat-free sweets. What foods are not recommended? The items listed may not be a complete list. Talk with your dietitian about what dietary choices are best for you. Grains Baked goods made with fat, such as croissants, muffins, or some breads. Dry pasta or rice meal packs. Vegetables Creamed or fried vegetables. Vegetables in a cheese sauce.  Regular canned vegetables (not low-sodium or reduced-sodium). Regular canned tomato sauce and paste (not low-sodium or reduced-sodium). Regular tomato and vegetable juice (not low-sodium or reduced-sodium). Angie Fava. Olives. Fruits Canned fruit in a light or heavy syrup. Fried fruit. Fruit in cream or butter sauce. Meat and other protein foods Fatty cuts of meat. Ribs. Fried meat. Berniece Salines. Sausage. Bologna and other processed lunch meats. Salami. Fatback. Hotdogs. Bratwurst. Salted nuts and seeds. Canned beans with added salt. Canned or smoked fish. Whole eggs or egg yolks. Chicken or [place] with skin. Dairy Whole or 2% milk, cream, and half-and-half. Whole or full-fat cream cheese. Whole-fat or sweetened yogurt. Full-fat cheese. Nondairy creamers. Whipped toppings. Processed cheese and cheese spreads. Fats and oils Butter. Stick margarine. Lard. Shortening. Ghee. Bacon fat. Tropical oils, such as coconut, palm kernel, or palm oil. Seasoning and other foods Salted popcorn and pretzels. Onion salt, garlic salt, seasoned salt, table salt, and sea salt. Worcestershire sauce. Tartar sauce. Barbecue sauce. Teriyaki sauce. Soy sauce, including reduced-sodium. Steak sauce. Canned and packaged gravies. Fish sauce. Oyster sauce. Cocktail sauce. Horseradish that you find on the shelf. Ketchup. Mustard. Meat flavorings and tenderizers. Bouillon cubes. Hot sauce and Tabasco sauce. Premade or packaged marinades. Premade or packaged taco seasonings. Relishes. Regular salad dressings. Where  to find more information:  National Heart, Lung, and [place]: https://wilson-eaton.com/  American Heart Association: www.heart.org Summary  The DASH eating plan is a healthy eating plan that has been shown to reduce high blood pressure (hypertension). It may also reduce your risk for type 2 diabetes, heart disease, and stroke.  With the DASH eating plan, you should limit salt (sodium) intake to 2,300 mg a day. If you have  hypertension, you may need to reduce your sodium intake to 1,500 mg a day.  When on the DASH eating plan, aim to eat more fresh fruits and vegetables, whole grains, lean proteins, low-fat dairy, and heart-healthy fats.  Work with your health care provider or diet and nutrition specialist (dietitian) to adjust your eating plan to your individual calorie needs. This information is not intended to replace advice given to you by your health care provider. Make sure you discuss any questions you have with your health care provider. Document Released: 01/08/2011 Document Revised: 01/13/2016 Document Reviewed: 01/13/2016 Elsevier Interactive Patient Education  [employer].

## 2017-05-21 NOTE — Progress Notes (Signed)
Follow-up Outpatient Visit Date: 05/21/2017  Primary Care Provider: Janith Lima, MD [address] [id number]  Chief Complaint: Shortness of breath with chronic valvular heart disease and coronary artery disease  HPI:  Heather Crawford is a 72 y.o. year-old female with history of rheumatic fever and valvular heart disease complicated by aortic stenosis and mitral regurgitation status post bioprosthetic aortic valve replacement (2012), coronary artery disease with RIMA to RCA at time of AVR, mild to moderate bilateral internal carotid artery stenosis, and labyrinthitis with chronic vertigo, who presents for follow-up of shortness of breath.  I last saw Heather Crawford in October, which time she was doing well with the exception of pain at the site of prior right ankle fracture.  She reported occasional orthostatic lightheadedness when standing quickly.  No medication changes were made at the time.  Today, Heather Crawford reports feeling about the same as at her last visit.  She has stable exertional dyspnea.  She had gained a few pounds over this week after eating at [place] [employer].  She has taken furosemide the last 2 days with notable improvement in edema and her weight.  She denies chest pain, palpitations, and lightheadedness.  She notes that her home blood pressures are typically less than 130/70.  Heather Crawford was recently seen by her PCP, Dr. Ronnald Ramp, who recommended starting Vascepa.  Unfortunately, Heather Crawford was unable to afford the $400 out-of-pocket expense.  The pharmacist recommend that she try taking fish oil instead.  --------------------------------------------------------------------------------------------------   Past Medical/Surgical History: 1. 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 HEART VALVE DISEASE: Echo (1/12) with mildly dilated LV, EF 45-50% with paradoxical septal motion consistent with LBBB, moderate aortic stenosis with  mean gradient 27 mmHg, trivial AI, mild to moderate mitral regurgitation with calcified mitral valve. 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 Echo (5/12): EF 55-60%, basal inferior hypokinesis, moderate diastolic dysfunction, normally functioning bioprosthetic aortic valve with mean gradient 13 mmHg, normal RV. Echo (9/14) with EF 60-65%, bioprosthetic aortic valve with mean gradient 16 mmHg, no AI, mild-moderate MR. Echo (9/16) with EF 55-60%, bioprosthetic aortic valve normal. Echo (9/18) with LVEF 60-65% and stable mean gradient across bioprosthetic AVR since 2015. Mild MR. 4. LBBB 5. ALLERGIC RHINITIS  6. GERD 7. Hx of DIVERTICULITIS 8. Chronic low back pain with history of back surgery 9. Appendectomy 10. Hysterectomy 11. Hyperlipidemia: Myalgias with atorvastatin and Crestor.  12. CAD: Adenosine myoview (2/12) with EF 56%, ischemia with scar in the mid to apical anterior wall, septal wall, and apex. LHC (3/12) with EF 55%, 40% ostial RCA, 60% mid RCA, 95% distal RCA. Intervention attempted but unable to adequately seat catheter. Patient had CABG-AVR in 3/12 with RIMA-RCA.  13. Carotid dopplers (3/12): no significant stenosis. Carotid dopplers (9/14) with 40-59% bilateral ICA stenosis. Carotid dopplers (9/15) with 40-59% bilateral ICA stenosis. Carotid dopplers (9/16) with 1-39% BICA stenosis, >50% left subclavian stenosis.  14. Vertigo: labyrinthitis  15. Plantar fasciitis  Past medical and surgical history were reviewed and updated in EPIC.  Current Meds  Medication Sig  . aspirin 325 MG tablet Take 325 mg by mouth daily.  Marland Kitchen azithromycin (ZITHROMAX) 500 MG tablet Use as directed one (1) hour prior to dental procedures.  . carvedilol (COREG) 6.25 MG tablet TAKE 1 TABLET TWICE DAILY WITH A MEAL  . cetirizine (ZYRTEC) 10 MG tablet Take 10 mg by mouth daily. Alternates every 3 months with Claritin.  . Cholecalciferol  2000 units TABS Take 1 tablet (2,000 Units total) by  mouth daily.  . fluticasone (FLONASE) 50 MCG/ACT nasal spray PLACE 1 SPRAY INTO BOTH NOSTRILS DAILY.  . furosemide (LASIX) 20 MG tablet Take 1 tablet by mouth daily as needed for edema or weight gain Take 10 mEq potassium when you take lasix  . loratadine (CLARITIN) 10 MG tablet Take 10 mg by mouth daily. Alternates every 3 months with Zyrtec.  . Omega-3 Fatty Acids (FISH OIL) 1000 MG CAPS Take 2,000 mg by mouth daily.  . potassium chloride (K-DUR,KLOR-CON) 10 MEQ tablet Take 1 tablet by mouth on the days you take lasix (furosemide)  . ranitidine (ZANTAC) 150 MG tablet Take 150 mg by mouth daily as needed. For reflux  . simvastatin (ZOCOR) 40 MG tablet Take 1 tablet (40 mg total) by mouth at bedtime.    Allergies: Tape; Amoxicillin; Crestor [rosuvastatin]; Lipitor [atorvastatin]; Neomycin; Sulfamethoxazole-trimethoprim; and Polysporin [bacitracin-polymyxin b]  Social History   Tobacco Use  . Smoking status: Former Smoker    Packs/day: 1.00    Years: 40.00    Pack years: 40.00    Types: Cigarettes    Last attempt to quit: 02/03/2000    Years since quitting: 17.3  . Smokeless tobacco: Former Systems developer    Types: Chew  . Tobacco comment: discussed LDCT   Substance Use Topics  . Alcohol use: No    Alcohol/week: 0.0 oz    Frequency: Never  . Drug use: No    Family History  Problem Relation Age of Onset  . Hypertension Mother   . Breast cancer Mother   . Hypothyroidism Mother   . Diabetes type II Mother   . Heart disease Mother   . Heart attack Father        CABG @ 64  . CAD Father   . Breast cancer Sister        x 2  . Colon cancer Maternal Aunt   . Breast cancer Maternal Aunt   . Colon cancer Paternal Uncle   . Pancreatic cancer Other        PGA  . Breast cancer Paternal Grandmother     Review of Systems: A 12-system review of systems was performed and was negative except as noted in the  HPI.  --------------------------------------------------------------------------------------------------  Physical Exam: BP 140/70   Pulse 70   Ht 5\' 2"  (1.575 m)   Wt 177 lb 12.8 oz (80.6 kg)   BMI 32.52 kg/m   General: NAD. HEENT: No conjunctival pallor or scleral icterus. Moist mucous membranes.  OP clear. Neck: Supple without lymphadenopathy, thyromegaly, JVD, or HJR.. Lungs: Normal work of breathing. Clear to auscultation bilaterally without wheezes or crackles. Heart: Heart rate and rhythm with 1/6 systolic murmur loudest at the left lower sternal border.  No rubs or gallops.  Unable to assess PMI due to body habitus. Abd: Bowel sounds present. Soft, NT/ND without hepatosplenomegaly Ext: Trace ankle edema bilaterally. Radial, PT, and DP pulses are 2+ bilaterally. Skin: Warm and dry without rash.  EKG: Sinus rhythm with left bundle branch block (old).  Lab Results  Component Value Date   WBC 7.0 02/17/2017   HGB 12.8 02/17/2017   HCT 38.5 02/17/2017   MCV 92.7 02/17/2017   PLT 242.0 02/17/2017    Lab Results  Component Value Date   NA 138 02/17/2017   K 4.4 02/17/2017   CL 101 02/17/2017   CO2 30 02/17/2017   BUN 12 02/17/2017   CREATININE 0.61 02/17/2017  GLUCOSE 121 (H) 02/17/2017   ALT 17 02/17/2017    Lab Results  Component Value Date   CHOL 164 02/17/2017   HDL 39.70 02/17/2017   LDLCALC 60 05/09/2015   LDLDIRECT 101.0 02/17/2017   TRIG 372.0 (H) 02/17/2017   CHOLHDL 4 02/17/2017    --------------------------------------------------------------------------------------------------  ASSESSMENT AND PLAN: Coronary artery disease without angina No chest pain.  Chronic exertional dyspnea is nonspecific and similar to prior visits.  Continue current medications for secondary prevention.  While I agree that adding Vascepa could be helpful for secondary prevention in the setting of CAD and hypertriglyceridemia, the medication is cost prohibitive.  I think  it is reasonable to continue with fish oil 2-3 g/day.  Chronic diastolic heart failure and rheumatic heart disease Weight is stable, though Heather Crawford reports recent leg edema in the setting of dietary indiscretion with increased sodium intake.  I have encouraged her to limit her salt intake.  It is okay for her to continue to take furosemide as needed.  If her symptoms worsen significantly, we will need to consider repeating an echo to exclude new valve prosthesis dysfunction.  Importance of antibiotic prophylaxis prior to dental procedures was reiterated.  Hyperlipidemia Unfortunately, triglycerides and LDL are suboptimally controlled based on lipid panel in January.  Heather Crawford is currently on simvastatin 40 mg daily, as she has been intolerant of rosuvastatin and atorvastatin due to myalgias.  Her LDL was previously adequately controlled on simvastatin.  We have discussed lifestyle modifications and weight loss as an option to help get her LDL to goal.  We will proceed with this before making medication changes.  As above, we will use fish oil in the lieu of the Vascepa due to cost.  If LDL remains suboptimally controlled (goal less than 70), we will need to have Heather Crawford see our lipid clinic to discuss PCSK9 inhibitor therapy.  However, I worry that this may also be cost prohibitive for her.  Follow-up: Return to clinic in 6 months.  Nelva Bush, MD 05/22/2017 11:44 AM

## 2017-05-22 ENCOUNTER — Encounter: Payer: Self-pay | Admitting: Internal Medicine

## 2017-05-22 DIAGNOSIS — I099 Rheumatic heart disease, unspecified: Secondary | ICD-10-CM | POA: Insufficient documentation

## 2017-09-20 ENCOUNTER — Other Ambulatory Visit: Payer: Self-pay | Admitting: Internal Medicine

## 2017-09-20 DIAGNOSIS — Z952 Presence of prosthetic heart valve: Secondary | ICD-10-CM

## 2017-09-20 DIAGNOSIS — R600 Localized edema: Secondary | ICD-10-CM

## 2017-11-17 LAB — HM DIABETES EYE EXAM

## 2017-12-10 ENCOUNTER — Ambulatory Visit: Payer: Medicare HMO | Admitting: Internal Medicine

## 2017-12-16 DIAGNOSIS — H5213 Myopia, bilateral: Secondary | ICD-10-CM | POA: Diagnosis not present

## 2017-12-27 DIAGNOSIS — E119 Type 2 diabetes mellitus without complications: Secondary | ICD-10-CM | POA: Diagnosis not present

## 2017-12-27 DIAGNOSIS — H40013 Open angle with borderline findings, low risk, bilateral: Secondary | ICD-10-CM | POA: Diagnosis not present

## 2018-01-06 ENCOUNTER — Encounter: Payer: Self-pay | Admitting: Internal Medicine

## 2018-01-06 ENCOUNTER — Ambulatory Visit: Payer: Medicare HMO | Admitting: Internal Medicine

## 2018-01-06 VITALS — BP 138/64 | HR 64 | Ht 63.0 in | Wt 171.0 lb

## 2018-01-06 DIAGNOSIS — R0789 Other chest pain: Secondary | ICD-10-CM | POA: Diagnosis not present

## 2018-01-06 DIAGNOSIS — I099 Rheumatic heart disease, unspecified: Secondary | ICD-10-CM

## 2018-01-06 DIAGNOSIS — I10 Essential (primary) hypertension: Secondary | ICD-10-CM

## 2018-01-06 DIAGNOSIS — E785 Hyperlipidemia, unspecified: Secondary | ICD-10-CM | POA: Diagnosis not present

## 2018-01-06 DIAGNOSIS — R0602 Shortness of breath: Secondary | ICD-10-CM | POA: Diagnosis not present

## 2018-01-06 DIAGNOSIS — Z952 Presence of prosthetic heart valve: Secondary | ICD-10-CM

## 2018-01-06 DIAGNOSIS — I2510 Atherosclerotic heart disease of native coronary artery without angina pectoris: Secondary | ICD-10-CM | POA: Diagnosis not present

## 2018-01-06 DIAGNOSIS — I5032 Chronic diastolic (congestive) heart failure: Secondary | ICD-10-CM | POA: Diagnosis not present

## 2018-01-06 LAB — LIPID PANEL
Chol/HDL Ratio: 3.6 ratio (ref 0.0–4.4)
Cholesterol, Total: 150 mg/dL (ref 100–199)
HDL: 42 mg/dL (ref >39)
LDL CALC: 61 mg/dL (ref 0–99)
Triglycerides: 237 mg/dL — ABNORMAL HIGH (ref 0–149)
VLDL CHOLESTEROL CAL: 47 mg/dL — AB (ref 5–40)

## 2018-01-06 LAB — COMPREHENSIVE METABOLIC PANEL
ALT: 24 IU/L (ref 0–32)
AST: 17 IU/L (ref 0–40)
Albumin/Globulin Ratio: 1.9 (ref 1.2–2.2)
Albumin: 4.5 g/dL (ref 3.5–4.8)
Alkaline Phosphatase: 55 IU/L (ref 39–117)
BUN/Creatinine Ratio: 18 (ref 12–28)
BUN: 13 mg/dL (ref 8–27)
Bilirubin Total: 0.5 mg/dL (ref 0.0–1.2)
CO2: 24 mmol/L (ref 20–29)
Calcium: 9.8 mg/dL (ref 8.7–10.3)
Chloride: 97 mmol/L (ref 96–106)
Creatinine, Ser: 0.74 mg/dL (ref 0.57–1.00)
GFR calc Af Amer: 92 mL/min/{1.73_m2} (ref >59)
GFR calc non Af Amer: 80 mL/min/{1.73_m2} (ref >59)
Globulin, Total: 2.4 g/dL (ref 1.5–4.5)
Glucose: 128 mg/dL — ABNORMAL HIGH (ref 65–99)
Potassium: 4.8 mmol/L (ref 3.5–5.2)
Sodium: 138 mmol/L (ref 134–144)
Total Protein: 6.9 g/dL (ref 6.0–8.5)

## 2018-01-06 LAB — LDL CHOLESTEROL, DIRECT: LDL Direct: 79 mg/dL (ref 0–99)

## 2018-01-06 MED ORDER — POTASSIUM CHLORIDE CRYS ER 10 MEQ PO TBCR: EXTENDED_RELEASE_TABLET | ORAL | 2 refills | Status: DC

## 2018-01-06 MED ORDER — ASPIRIN EC 81 MG PO TBEC: 81.0000 mg | DELAYED_RELEASE_TABLET | Freq: Every day | ORAL | 3 refills | Status: DC

## 2018-01-06 MED ORDER — FUROSEMIDE 20 MG PO TABS: ORAL_TABLET | ORAL | 2 refills | Status: DC

## 2018-01-06 MED ORDER — OMEPRAZOLE 20 MG PO CPDR: 20.0000 mg | DELAYED_RELEASE_CAPSULE | Freq: Every day | ORAL | 3 refills | Status: DC

## 2018-01-06 NOTE — Patient Instructions (Signed)
Medication Instructions:  START Aspirin 81 mg daily STOP PEPCID START OMEPRAZOLE (PRILOSEC) 20mg  daily  If you need a refill on your cardiac medications before your next appointment, please call your pharmacy.   Lab work:TODAY CMP LIPID PANEL DIRECT LDL  If you have labs (blood work) drawn today and your tests are completely normal, you will receive your results only by: Marland Kitchen MyChart Message (if you have MyChart) OR . A paper copy in the mail If you have any lab test that is abnormal or we need to change your treatment, we will call you to review the results.  Testing/Procedures: NONE  Follow-Up: 3 MONTHS WITH SCOTT WEAVER OR LORI GERHARDT  At [facility], you and your health needs are our priority.  As part of our continuing mission to provide you with exceptional heart care, we have created designated Provider Care Teams.  These Care Teams include your primary Cardiologist (physician) and Advanced Practice Providers (APPs -  Physician Assistants and Nurse Practitioners) who all work together to provide you with the care you need, when you need it. Any Other Special Instructions Will Be Listed Below (If Applicable).

## 2018-01-06 NOTE — Progress Notes (Signed)
Follow-up Outpatient Visit Date: 01/06/2018  Primary Care Provider: Janith Lima, MD [address] [address]  Chief Complaint: Indigestion and swelling  HPI:  Heather Crawford is a 72 y.o. year-old female with history of rheumatic fever and valvular heart disease complicated by aortic stenosis and mitral regurgitation status post bioprosthetic aortic valve replacement (2012), coronary artery disease with RIMA to RCA at time of AVR, mild to moderate bilateral internal carotid artery stenosis, and labyrinthitis with chronic vertigo, who presents for follow-up of shortness of breath.  I last saw Heather Crawford in April, at which time she reported stable dyspnea on exertion.  She had gained some weight shortly before our visit after eating at [facility], though this was improving with furosemide.  No medication changes were made at that time.  Today, Heather Crawford has several complaints.  She began having congestion upper respiratory tract infectious symptoms yesterday.  She has also had increasing epigastric discomfort that radiates to the lower chest since stopping ranitidine due to medication recall.  She has been using famotidine but feels that it does not work as well.  She has not had any exertional chest pain but continues to have shortness of breath with activity.  During a recent road trip to the [facility], she also experienced increased leg swelling.  She was using as needed furosemide but ran out of this.  She also notes fatigue during the day, often feeling like she needs to nap.  She intermittently feels well rested when she first awakens in the morning.  Her weight increased with fluid retention this fall, though it has actually dropped below her previous baseline now.  --------------------------------------------------------------------------------------------------  Past Medical/Surgical History: 1. 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 HEART VALVE  DISEASE: Echo (1/12) with mildly dilated LV, EF 45-50% with paradoxical septal motion consistent with LBBB, moderate aortic stenosis with mean gradient 27 mmHg, trivial AI, mild to moderate mitral regurgitation with calcified mitral valve. 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 Echo (5/12): EF 55-60%, basal inferior hypokinesis, moderate diastolic dysfunction, normally functioning bioprosthetic aortic valve with mean gradient 13 mmHg, normal RV. Echo (9/14) with EF 60-65%, bioprosthetic aortic valve with mean gradient 16 mmHg, no AI, mild-moderate MR. Echo (9/16) with EF 55-60%, bioprosthetic aortic valve normal. Echo (9/18) with LVEF 60-65% and stable mean gradient across bioprosthetic AVR since 2015. Mild MR. 4. LBBB 5. ALLERGIC RHINITIS  6. GERD 7. Hx of DIVERTICULITIS 8. Chronic low back pain with history of back surgery 9. Appendectomy 10. Hysterectomy 11. Hyperlipidemia: Myalgias with atorvastatin and Crestor.  12. CAD: Adenosine myoview (2/12) with EF 56%, ischemia with scar in the mid to apical anterior wall, septal wall, and apex. LHC (3/12) with EF 55%, 40% ostial RCA, 60% mid RCA, 95% distal RCA. Intervention attempted but unable to adequately seat catheter. Patient had CABG-AVR in 3/12 with RIMA-RCA.  13. Carotid dopplers (3/12): no significant stenosis. Carotid dopplers (9/14) with 40-59% bilateral ICA stenosis. Carotid dopplers (9/15) with 40-59% bilateral ICA stenosis. Carotid dopplers (9/16) with 1-39% BICA stenosis, >50% left subclavian stenosis.  14. Vertigo: labyrinthitis  15. Plantar fasciitis   Current Meds  Medication Sig  . azithromycin (ZITHROMAX) 500 MG tablet Use as directed one (1) hour prior to dental procedures.  . carvedilol (COREG) 6.25 MG tablet TAKE 1 TABLET TWICE DAILY WITH A MEAL  . cetirizine (ZYRTEC) 10 MG tablet Take 10 mg by mouth daily. Alternates every 3 months with Claritin.  Marland Kitchen  Cholecalciferol 2000 units TABS Take 1 tablet (2,000  Units total) by mouth daily.  . fluticasone (FLONASE) 50 MCG/ACT nasal spray PLACE 1 SPRAY INTO BOTH NOSTRILS DAILY.  . furosemide (LASIX) 20 MG tablet Take 1 tablet by mouth daily as needed for edema or weight gain Take 10 mEq potassium when you take lasix  . loratadine (CLARITIN) 10 MG tablet Take 10 mg by mouth daily. Alternates every 3 months with Zyrtec.  . Omega-3 Fatty Acids (FISH OIL) 1000 MG CAPS Take 2,000 mg by mouth daily.  . potassium chloride (K-DUR,KLOR-CON) 10 MEQ tablet Take 1 tablet by mouth on the days you take lasix (furosemide)  . simvastatin (ZOCOR) 40 MG tablet Take 1 tablet (40 mg total) by mouth at bedtime.  . [DISCONTINUED] aspirin 325 MG tablet Take 325 mg by mouth daily.  . [DISCONTINUED] famotidine (PEPCID) 20 MG tablet Take 20 mg by mouth 2 (two) times daily.  . [DISCONTINUED] furosemide (LASIX) 20 MG tablet Take 1 tablet by mouth daily as needed for edema or weight gain Take 10 mEq potassium when you take lasix  . [DISCONTINUED] potassium chloride (K-DUR,KLOR-CON) 10 MEQ tablet Take 1 tablet by mouth on the days you take lasix (furosemide)  . [DISCONTINUED] ranitidine (ZANTAC) 150 MG tablet Take 150 mg by mouth daily as needed. For reflux    Allergies: Tape; Amoxicillin; Crestor [rosuvastatin]; Lipitor [atorvastatin]; Neomycin; Sulfamethoxazole-trimethoprim; and Polysporin [bacitracin-polymyxin b]  Social History   Tobacco Use  . Smoking status: Former Smoker    Packs/day: 1.00    Years: 40.00    Pack years: 40.00    Types: Cigarettes    Last attempt to quit: 02/03/2000    Years since quitting: 17.9  . Smokeless tobacco: Former Systems developer    Types: Chew  . Tobacco comment: discussed LDCT   Substance Use Topics  . Alcohol use: No    Alcohol/week: 0.0 standard drinks    Frequency: Never  . Drug use: No    Family History  Problem Relation Age of Onset  . Hypertension Mother   . Breast cancer Mother   . Hypothyroidism Mother   . Diabetes type II Mother   .  Heart disease Mother   . Heart attack Father        CABG @ 31  . CAD Father   . Breast cancer Sister        x 2  . Colon cancer Maternal Aunt   . Breast cancer Maternal Aunt   . Colon cancer Paternal Uncle   . Pancreatic cancer Other        PGA  . Breast cancer Paternal Grandmother     Review of Systems: A 12-system review of systems was performed and was negative except as noted in the HPI.  --------------------------------------------------------------------------------------------------  Physical Exam: BP 138/64   Pulse 64   Ht 5\' 3"  (1.6 m)   Wt 171 lb (77.6 kg)   SpO2 96%   BMI 30.29 kg/m   General: NAD. HEENT: No conjunctival pallor or scleral icterus. Moist mucous membranes.  OP clear. Neck: Supple without lymphadenopathy, thyromegaly, JVD, or HJR. Lungs: Normal work of breathing. Clear to auscultation bilaterally without wheezes or crackles. Heart: Regular rate and rhythm with 2/6 crescendo-decrescendo systolic murmur loudest at the right upper sternal border.  No rubs or gallops.  Nondisplaced PMI. Abd: Bowel sounds present. Soft with mild epigastric and right upper quadrant tenderness.  No rebound or guarding.  No HSM. Ext: Trace pretibial edema bilaterally. Skin: Warm and  dry without rash.  Lab Results  Component Value Date   WBC 7.0 02/17/2017   HGB 12.8 02/17/2017   HCT 38.5 02/17/2017   MCV 92.7 02/17/2017   PLT 242.0 02/17/2017    Lab Results  Component Value Date   NA 138 02/17/2017   K 4.4 02/17/2017   CL 101 02/17/2017   CO2 30 02/17/2017   BUN 12 02/17/2017   CREATININE 0.61 02/17/2017   GLUCOSE 121 (H) 02/17/2017   ALT 17 02/17/2017    Lab Results  Component Value Date   CHOL 164 02/17/2017   HDL 39.70 02/17/2017   LDLCALC 60 05/09/2015   LDLDIRECT 101.0 02/17/2017   TRIG 372.0 (H) 02/17/2017   CHOLHDL 4 02/17/2017    --------------------------------------------------------------------------------------------------  ASSESSMENT  AND PLAN: Shortness of breath and atypical chest pain with history of coronary artery disease Symptoms are stable to slightly worse compared with our last visit.  Pain is predominantly epigastric but occasionally radiates to the lower chest.  Symptoms seem to have occurred after recall of ranitidine.  We discussed further testing, including repeat echocardiogram and stress test.  However, Ms. Bourassa would like to defer this for now.  We have agreed to stop famotidine and begin omeprazole 20 mg daily.  Rheumatic heart disease status post bioprosthetic AVR and chronic diastolic heart failure Ms. Tomaro appears grossly euvolemic other than trace ankle edema.  She reports NYHA class II-III symptoms.  Echo a year ago showed stable mildly increased gradient across AVR.  We discussed repeating an echo but have agreed to defer this for now.  If symptoms worsen, repeat echo will need to be considered.  I will decrease Ms. Mimnaugh's aspirin to 81 mg daily, particularly given concern for indigestion/GI irritation.  She will need to continue SBE prophylaxis when undergoing dental procedures.  I will refill her prescription for as needed Lasix and potassium.  We will check a complete metabolic panel today.  If fatigue persists, sleep study may need to be considered in the future as well.  Hyperlipidemia LDL not adequately controlled on last check in 02/2017.  We will recheck a lipid panel today to help guide need for adjustment to her current regimen of simvastatin 40 mg nightly.  Target LDL is less than 70, given history of CAD/CABG.  Hypertension BP borderline elevated.  Continue current medications for now.  Follow-up: Return to clinic in 3 months with Richardson Dopp, PA, or Rise Patience, NP.  Long-term, patient wishes to establish with a new provider in the [employer], given my transition to [place].  Nelva Bush, MD 01/06/2018 12:41 PM

## 2018-01-09 DIAGNOSIS — J0100 Acute maxillary sinusitis, unspecified: Secondary | ICD-10-CM | POA: Diagnosis not present

## 2018-01-09 DIAGNOSIS — J209 Acute bronchitis, unspecified: Secondary | ICD-10-CM | POA: Diagnosis not present

## 2018-01-10 ENCOUNTER — Telehealth: Payer: Self-pay

## 2018-01-10 NOTE — Telephone Encounter (Signed)
Notes recorded by Frederik Schmidt, RN on 01/10/2018 at 8:36 AM EST The patient has been notified of the result and verbalized understanding. All questions (if any) were answered. Frederik Schmidt, RN 01/10/2018 8:36 AM

## 2018-01-10 NOTE — Telephone Encounter (Signed)
-----   Message from Nelva Bush, MD sent at 01/09/2018 10:56 AM EST ----- Please let Heather Crawford know that her kidney function and liver function are normal.  Cholesterol is slightly above goal with an LDL of a direct LDL of 79 (goal < 70).  Given her history of atorvastatin intolerance, I recommend that Ms. Bertran work on lifestyle modifications (diet and exercise) and continue her current dose of simvastatin.  If LDL remains suboptimally controlled in the future, transition to rosuvastatin or addition of ezetimibe may need to be tried.

## 2018-01-12 ENCOUNTER — Other Ambulatory Visit: Payer: Self-pay | Admitting: *Deleted

## 2018-01-12 DIAGNOSIS — Z952 Presence of prosthetic heart valve: Secondary | ICD-10-CM

## 2018-01-12 MED ORDER — POTASSIUM CHLORIDE CRYS ER 10 MEQ PO TBCR: EXTENDED_RELEASE_TABLET | ORAL | 3 refills | Status: DC

## 2018-01-12 MED ORDER — FUROSEMIDE 20 MG PO TABS: ORAL_TABLET | ORAL | 3 refills | Status: DC

## 2018-01-12 NOTE — Telephone Encounter (Signed)
Patient called and stated that her rx's were supposed to be sent to [facility] at her recent office visit with Dr End but they were sent to [facility]. She is aware that I will send requested rx's to [facility]. She verbalized her understanding and appreciation.

## 2018-01-28 ENCOUNTER — Telehealth: Payer: Self-pay | Admitting: Physician Assistant

## 2018-01-28 DIAGNOSIS — E785 Hyperlipidemia, unspecified: Secondary | ICD-10-CM

## 2018-01-28 MED ORDER — SIMVASTATIN 40 MG PO TABS: 40.0000 mg | ORAL_TABLET | Freq: Every day | ORAL | 1 refills | Status: DC

## 2018-01-28 NOTE — Telephone Encounter (Signed)
lpmtcb 12/27

## 2018-01-28 NOTE — Telephone Encounter (Signed)
New Message   Pt c/o medication issue:  1. Name of Medication: Simvastatin   2. How are you currently taking this medication (dosage and times per day)? Currently not taking, has not been filled since 08/2016  3. Are you having a reaction (difficulty breathing--STAT)? no  4. What is your medication issue? Pharmacist says prescription has not been filled since 08/2016 and is wondering if she should be using it and if so, they need a new updated prescription

## 2018-01-28 NOTE — Telephone Encounter (Signed)
Spoke to patient in regards to her Simvastatin 40 mg.  She says that she has been taking and that the Pharmacy has been sending it.  I will reorder for her.

## 2018-02-03 IMAGING — CR DG KNEE COMPLETE 4+V*R*
4 series · 4 of 4 positions shown · non-contrast
Comparison: None.

CLINICAL DATA: Status post fall, with right upper leg tenderness
and pain. Initial encounter.

EXAM:
RIGHT KNEE - COMPLETE 4+ VIEW

[knee ap]
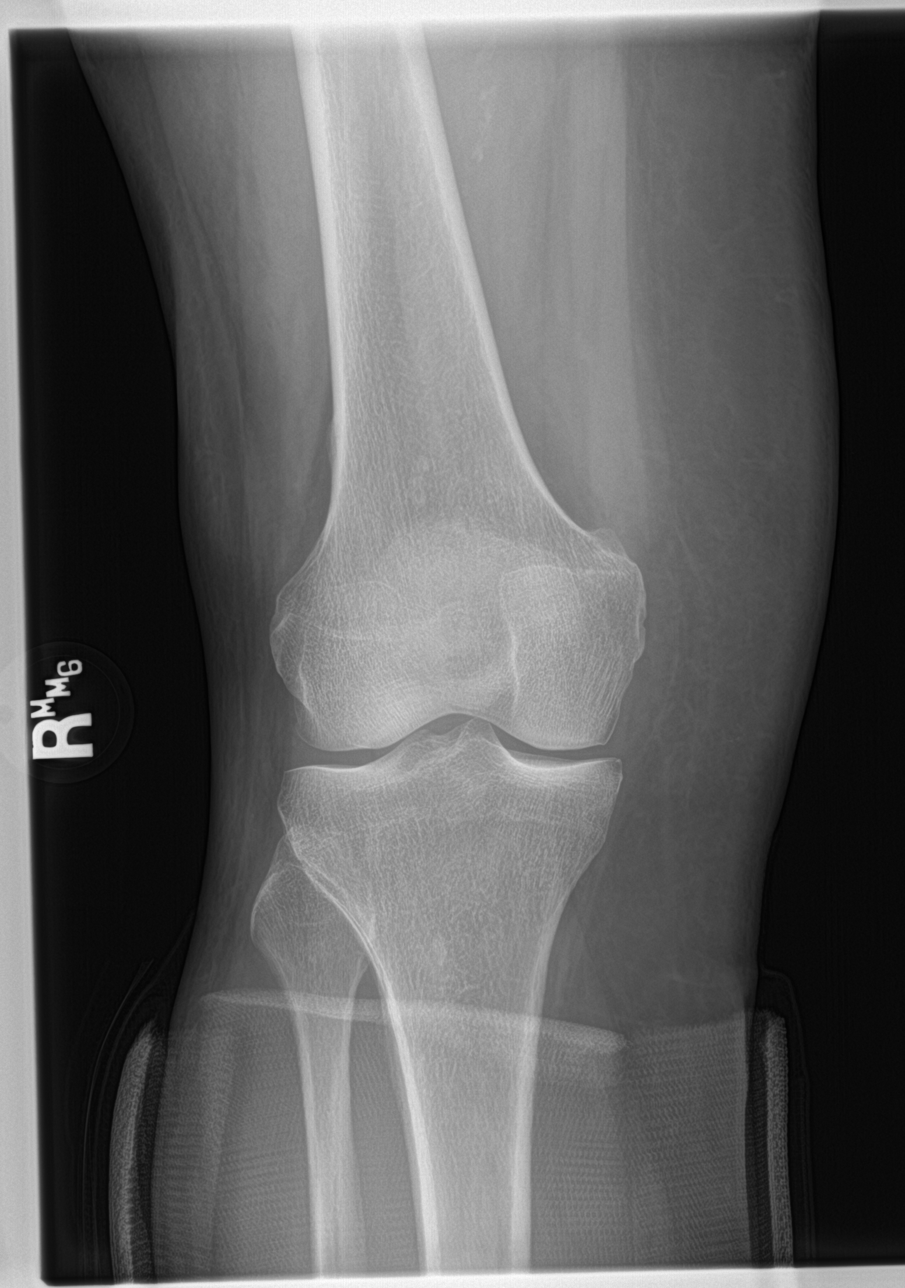

[knee lat]
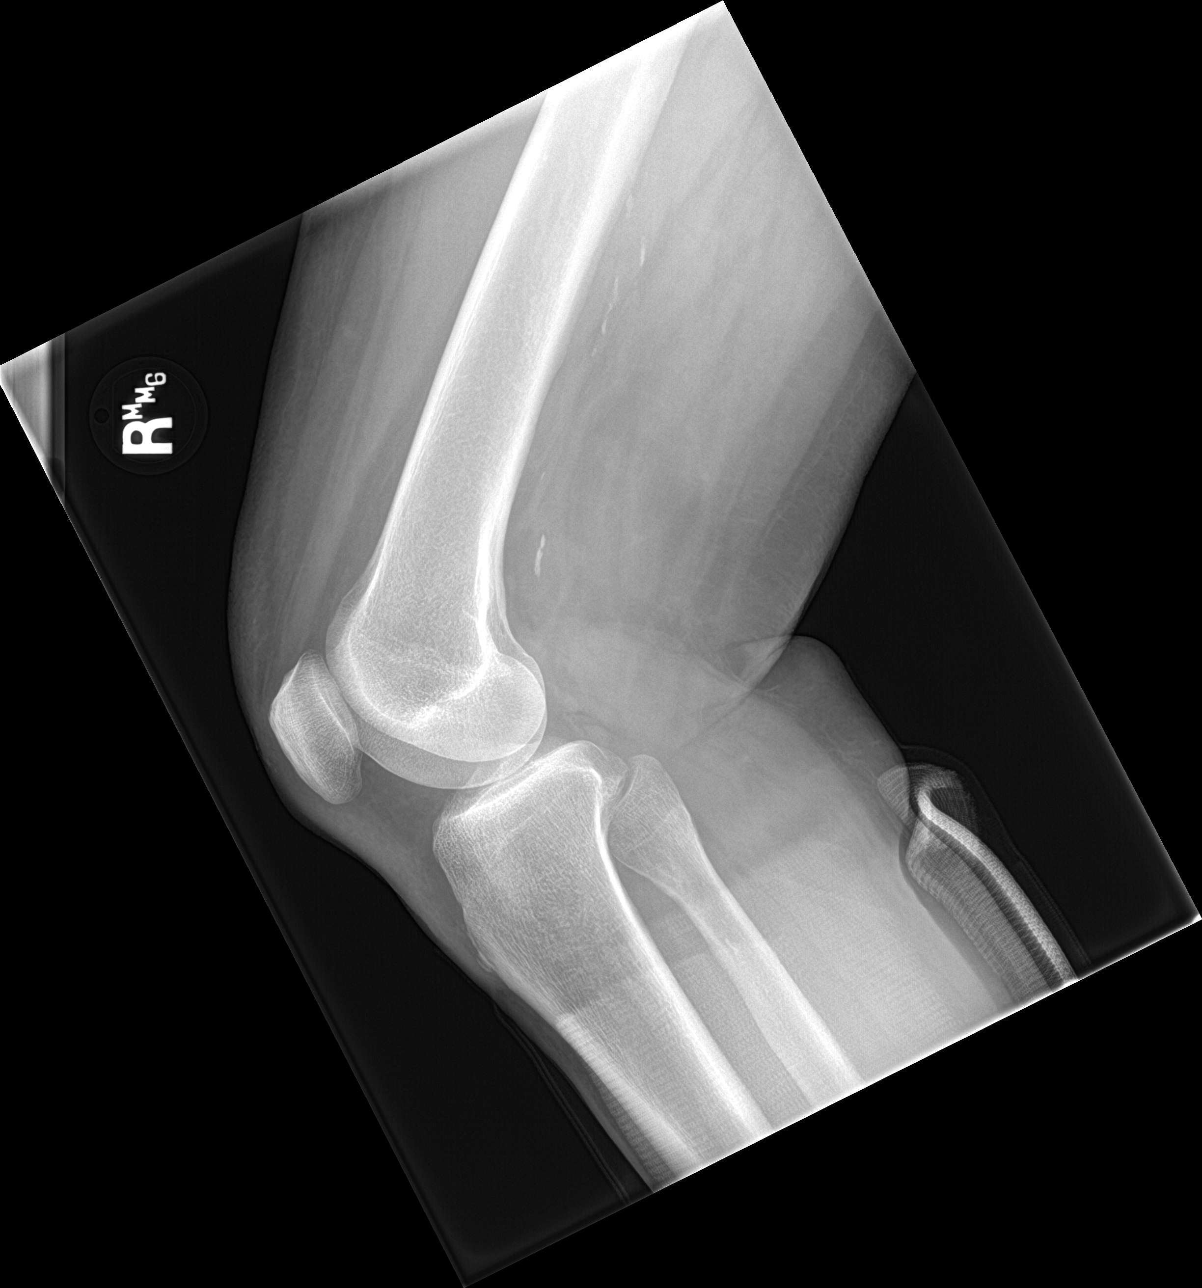

[knee obl (1 of 2)]
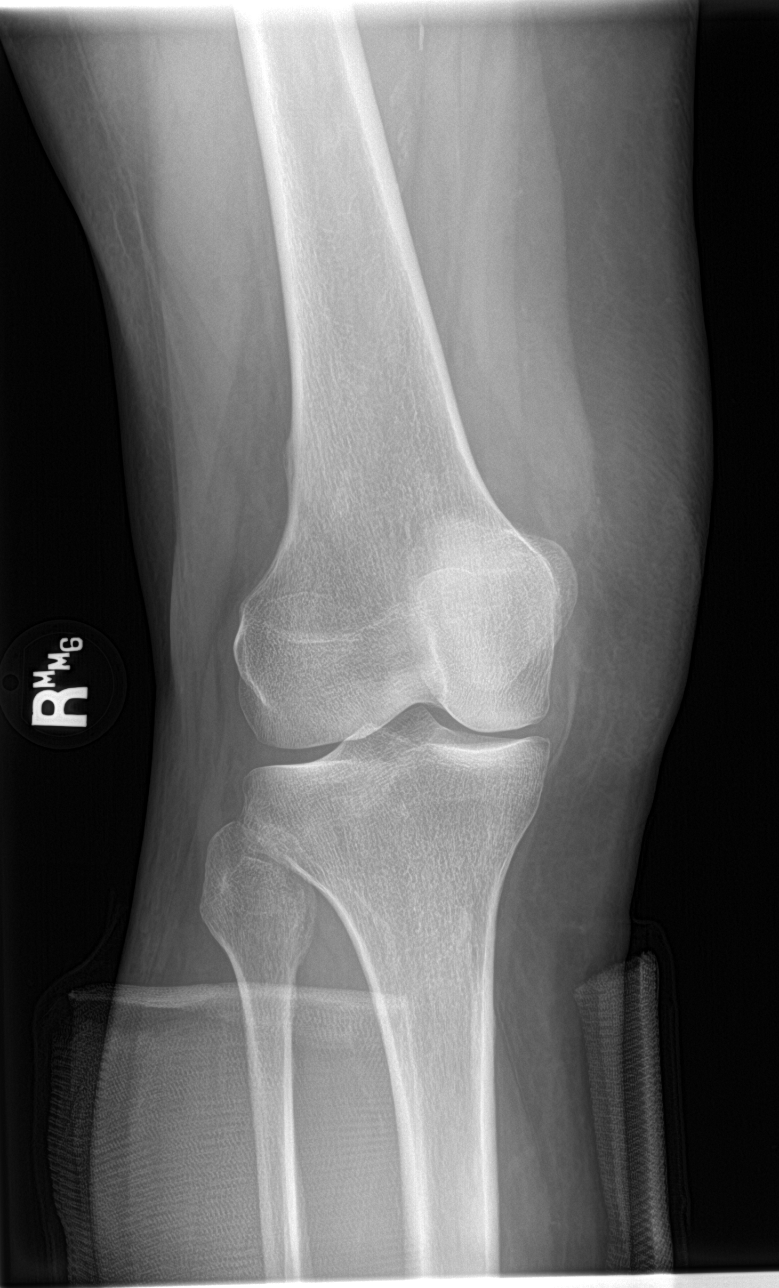

[knee obl (2 of 2)]
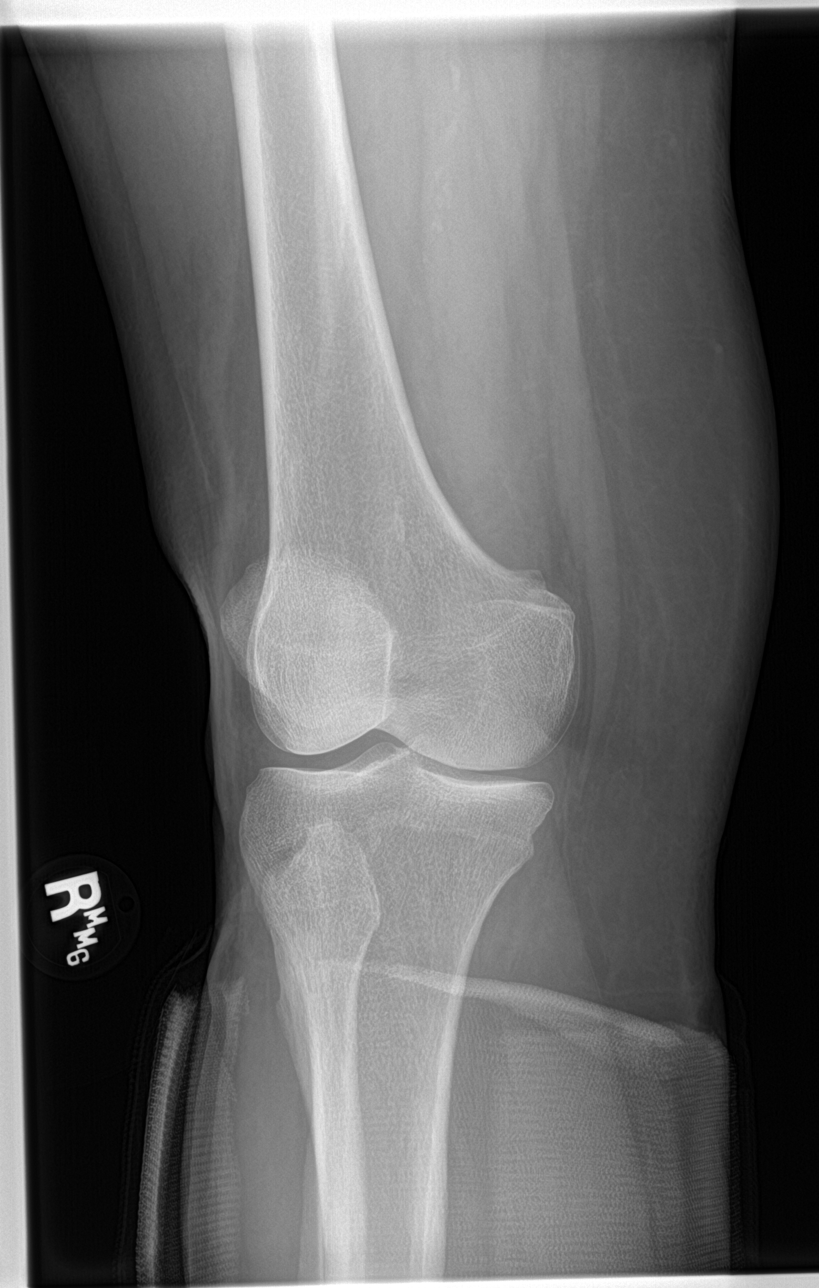

[4 of 4 positions shown; findings below may reference images not displayed]

FINDINGS: There is no evidence of fracture or dislocation. The joint spaces
are preserved. No significant degenerative change is seen; the
patellofemoral joint is grossly unremarkable in appearance.

No significant joint effusion is seen. Scattered vascular
calcifications are seen.
IMPRESSION: 1. No evidence of fracture or dislocation.
2. Scattered vascular calcifications noted.

## 2018-02-09 ENCOUNTER — Ambulatory Visit: Payer: Medicare HMO

## 2018-02-09 ENCOUNTER — Other Ambulatory Visit: Payer: Self-pay | Admitting: Internal Medicine

## 2018-02-09 DIAGNOSIS — Z952 Presence of prosthetic heart valve: Secondary | ICD-10-CM

## 2018-02-09 DIAGNOSIS — R600 Localized edema: Secondary | ICD-10-CM

## 2018-02-10 DIAGNOSIS — J209 Acute bronchitis, unspecified: Secondary | ICD-10-CM | POA: Diagnosis not present

## 2018-02-10 DIAGNOSIS — J0100 Acute maxillary sinusitis, unspecified: Secondary | ICD-10-CM | POA: Diagnosis not present

## 2018-03-22 ENCOUNTER — Encounter: Payer: Self-pay | Admitting: Physician Assistant

## 2018-04-04 ENCOUNTER — Ambulatory Visit (INDEPENDENT_AMBULATORY_CARE_PROVIDER_SITE_OTHER): Payer: Medicare HMO | Admitting: *Deleted

## 2018-04-04 ENCOUNTER — Telehealth: Payer: Self-pay | Admitting: *Deleted

## 2018-04-04 ENCOUNTER — Other Ambulatory Visit: Payer: Self-pay | Admitting: Internal Medicine

## 2018-04-04 VITALS — BP 142/78 | HR 66 | Resp 17 | Ht 63.0 in | Wt 176.0 lb

## 2018-04-04 DIAGNOSIS — H8113 Benign paroxysmal vertigo, bilateral: Secondary | ICD-10-CM | POA: Insufficient documentation

## 2018-04-04 DIAGNOSIS — Z0000 Encounter for general adult medical examination without abnormal findings: Secondary | ICD-10-CM

## 2018-04-04 DIAGNOSIS — E118 Type 2 diabetes mellitus with unspecified complications: Secondary | ICD-10-CM | POA: Diagnosis not present

## 2018-04-04 MED ORDER — MECLIZINE HCL 12.5 MG PO TABS: 12.5000 mg | ORAL_TABLET | Freq: Three times a day (TID) | ORAL | 3 refills | Status: DC | PRN

## 2018-04-04 NOTE — Telephone Encounter (Signed)
During AWV, patient asked if she could get a prescription for meclizine. Stating that she has been taking the medication from an old prescription that has expired. She explained that she does not get vertigo very often but likes to have the medication on hand if needed.

## 2018-04-04 NOTE — Patient Instructions (Signed)
Continue doing brain stimulating activities (puzzles, reading, adult coloring books, staying active) to keep memory sharp.   Continue to eat heart healthy diet (full of fruits, vegetables, whole grains, lean protein, water--limit salt, fat, and sugar intake) and increase physical activity as tolerated.   Mr. Heather Crawford , Thank you for taking time to come for your Medicare Wellness Visit. I appreciate your ongoing commitment to your health goals. Please review the following plan we discussed and let me know if I can assist you in the future.   These are the goals we discussed: Goals    . Exercise 150 minutes per week (moderate activity)     Will start walking x 30 minutes 3 days a week  Will build up to this time and then move on.  Keep journal    . Patient Stated     Lose weight, I will watch what I eat by lowering sugar and carbohydrates, start to exercise, and join [employer].    . Patient Stated     Stay active as possible by walking around [place], doing house work, and going to the [employer].    . Weight (lb) < 160 lb (72.6 kg)     Will try again to lose some weight;  Will drink more water       This is a list of the screening recommended for you and due dates:  Health Maintenance  Topic Date Due  . Hemoglobin A1C  08/17/2017  . Eye exam for diabetics  10/03/2017  . Complete foot exam   02/08/2018  . Urine Protein Check  02/17/2018  . Flu Shot  05/04/2018*  . Mammogram  02/10/2019  . Tetanus Vaccine  02/22/2020  . Colon Cancer Screening  04/29/2020  . DEXA scan (bone density measurement)  Completed  .  Hepatitis C: One time screening is recommended by Center for Disease Control  (CDC) for  adults born from [birth year] through 1967.   Completed  . Pneumonia vaccines  Completed  *Topic was postponed. The date shown is not the original due date.   Health Maintenance, Female Adopting a healthy lifestyle and getting preventive care can go a long way to promote health and  wellness. Talk with your health care provider about what schedule of regular examinations is right for you. This is a good chance for you to check in with your provider about disease prevention and staying healthy. In between checkups, there are plenty of things you can do on your own. Experts have done a lot of research about which lifestyle changes and preventive measures are most likely to keep you healthy. Ask your health care provider for more information. Weight and diet Eat a healthy diet  Be sure to include plenty of vegetables, fruits, low-fat dairy products, and lean protein.  Do not eat a lot of foods high in solid fats, added sugars, or salt.  Get regular exercise. This is one of the most important things you can do for your health. ? Most adults should exercise for at least 150 minutes each week. The exercise should increase your heart rate and make you sweat (moderate-intensity exercise). ? Most adults should also do strengthening exercises at least twice a week. This is in addition to the moderate-intensity exercise. Maintain a healthy weight  Body mass index (BMI) is a measurement that can be used to identify possible weight problems. It estimates body fat based on height and weight. Your health care provider can help determine your BMI and  help you achieve or maintain a healthy weight.  For females 40 years of age and older: ? A BMI below 18.5 is considered underweight. ? A BMI of 18.5 to 24.9 is normal. ? A BMI of 25 to 29.9 is considered overweight. ? A BMI of 30 and above is considered obese. Watch levels of cholesterol and blood lipids  You should start having your blood tested for lipids and cholesterol at 75 years of age, then have this test every 5 years.  You may need to have your cholesterol levels checked more often if: ? Your lipid or cholesterol levels are high. ? You are older than 75 years of age. ? You are at high risk for heart disease. Cancer  screening Lung Cancer  Lung cancer screening is recommended for adults 49-79 years old who are at high risk for lung cancer because of a history of smoking.  A yearly low-dose CT scan of the lungs is recommended for people who: ? Currently smoke. ? Have quit within the past 15 years. ? Have at least a 30-pack-year history of smoking. A pack year is smoking an average of one pack of cigarettes a day for 1 year.  Yearly screening should continue until it has been 15 years since you quit.  Yearly screening should stop if you develop a health problem that would prevent you from having lung cancer treatment. Breast Cancer  Practice breast self-awareness. This means understanding how your breasts normally appear and feel.  It also means doing regular breast self-exams. Let your health care provider know about any changes, no matter how small.  If you are in your 20s or 30s, you should have a clinical breast exam (CBE) by a health care provider every 1-3 years as part of a regular health exam.  If you are 20 or older, have a CBE every year. Also consider having a breast X-ray (mammogram) every year.  If you have a family history of breast cancer, talk to your health care provider about genetic screening.  If you are at high risk for breast cancer, talk to your health care provider about having an MRI and a mammogram every year.  Breast cancer gene (BRCA) assessment is recommended for women who have family members with BRCA-related cancers. BRCA-related cancers include: ? Breast. ? Ovarian. ? Tubal. ? Peritoneal cancers.  Results of the assessment will determine the need for genetic counseling and BRCA1 and BRCA2 testing. Cervical Cancer Your health care provider may recommend that you be screened regularly for cancer of the pelvic organs (ovaries, uterus, and vagina). This screening involves a pelvic examination, including checking for microscopic changes to the surface of your cervix  (Pap test). You may be encouraged to have this screening done every 3 years, beginning at age 71.  For women ages 54-65, health care providers may recommend pelvic exams and Pap testing every 3 years, or they may recommend the Pap and pelvic exam, combined with testing for human papilloma virus (HPV), every 5 years. Some types of HPV increase your risk of cervical cancer. Testing for HPV may also be done on women of any age with unclear Pap test results.  Other health care providers may not recommend any screening for nonpregnant women who are considered low risk for pelvic cancer and who do not have symptoms. Ask your health care provider if a screening pelvic exam is right for you.  If you have had past treatment for cervical cancer or a condition that could lead  to cancer, you need Pap tests and screening for cancer for at least 20 years after your treatment. If Pap tests have been discontinued, your risk factors (such as having a new sexual partner) need to be reassessed to determine if screening should resume. Some women have medical problems that increase the chance of getting cervical cancer. In these cases, your health care provider may recommend more frequent screening and Pap tests. Colorectal Cancer  This type of cancer can be detected and often prevented.  Routine colorectal cancer screening usually begins at 75 years of age and continues through 75 years of age.  Your health care provider may recommend screening at an earlier age if you have risk factors for colon cancer.  Your health care provider may also recommend using home test kits to check for hidden blood in the stool.  A small camera at the end of a tube can be used to examine your colon directly (sigmoidoscopy or colonoscopy). This is done to check for the earliest forms of colorectal cancer.  Routine screening usually begins at age 20.  Direct examination of the colon should be repeated every 5-10 years through 75 years  of age. However, you may need to be screened more often if early forms of precancerous polyps or small growths are found. Skin Cancer  Check your skin from head to toe regularly.  Tell your health care provider about any new moles or changes in moles, especially if there is a change in a mole's shape or color.  Also tell your health care provider if you have a mole that is larger than the size of a pencil eraser.  Always use sunscreen. Apply sunscreen liberally and repeatedly throughout the day.  Protect yourself by wearing long sleeves, pants, a wide-brimmed hat, and sunglasses whenever you are outside. Heart disease, diabetes, and high blood pressure  High blood pressure causes heart disease and increases the risk of stroke. High blood pressure is more likely to develop in: ? People who have blood pressure in the high end of the normal range (130-139/85-89 mm Hg). ? People who are overweight or obese. ? People who are African American.  If you are 54-75 years of age, have your blood pressure checked every 3-5 years. If you are 87 years of age or older, have your blood pressure checked every year. You should have your blood pressure measured twice-once when you are at a hospital or clinic, and once when you are not at a hospital or clinic. Record the average of the two measurements. To check your blood pressure when you are not at a hospital or clinic, you can use: ? An automated blood pressure machine at a pharmacy. ? A home blood pressure monitor.  If you are between 32 years and 11 years old, ask your health care provider if you should take aspirin to prevent strokes.  Have regular diabetes screenings. This involves taking a blood sample to check your fasting blood sugar level. ? If you are at a normal weight and have a low risk for diabetes, have this test once every three years after 75 years of age. ? If you are overweight and have a high risk for diabetes, consider being tested at  a younger age or more often. Preventing infection Hepatitis B  If you have a higher risk for hepatitis B, you should be screened for this virus. You are considered at high risk for hepatitis B if: ? You were born in a country where hepatitis  B is common. Ask your health care provider which countries are considered high risk. ? Your parents were born in a high-risk country, and you have not been immunized against hepatitis B (hepatitis B vaccine). ? You have HIV or AIDS. ? You use needles to inject street drugs. ? You live with someone who has hepatitis B. ? You have had sex with someone who has hepatitis B. ? You get hemodialysis treatment. ? You take certain medicines for conditions, including cancer, organ transplantation, and autoimmune conditions. Hepatitis C  Blood testing is recommended for: ? Everyone born from [birth year] through 1967. ? Anyone with known risk factors for hepatitis C. Sexually transmitted infections (STIs)  You should be screened for sexually transmitted infections (STIs) including gonorrhea and chlamydia if: ? You are sexually active and are younger than 75 years of age. ? You are older than 75 years of age and your health care provider tells you that you are at risk for this type of infection. ? Your sexual activity has changed since you were last screened and you are at an increased risk for chlamydia or gonorrhea. Ask your health care provider if you are at risk.  If you do not have HIV, but are at risk, it may be recommended that you take a prescription medicine daily to prevent HIV infection. This is called pre-exposure prophylaxis (PrEP). You are considered at risk if: ? You are sexually active and do not regularly use condoms or know the HIV status of your partner(s). ? You take drugs by injection. ? You are sexually active with a partner who has HIV. Talk with your health care provider about whether you are at high risk of being infected with HIV. If you choose  to begin PrEP, you should first be tested for HIV. You should then be tested every 3 months for as long as you are taking PrEP. Pregnancy  If you are premenopausal and you may become pregnant, ask your health care provider about preconception counseling.  If you may become pregnant, take 400 to 800 micrograms (mcg) of folic acid every day.  If you want to prevent pregnancy, talk to your health care provider about birth control (contraception). Osteoporosis and menopause  Osteoporosis is a disease in which the bones lose minerals and strength with aging. This can result in serious bone fractures. Your risk for osteoporosis can be identified using a bone density scan.  If you are 82 years of age or older, or if you are at risk for osteoporosis and fractures, ask your health care provider if you should be screened.  Ask your health care provider whether you should take a calcium or vitamin D supplement to lower your risk for osteoporosis.  Menopause may have certain physical symptoms and risks.  Hormone replacement therapy may reduce some of these symptoms and risks. Talk to your health care provider about whether hormone replacement therapy is right for you. Follow these instructions at home:  Schedule regular health, dental, and eye exams.  Stay current with your immunizations.  Do not use any tobacco products including cigarettes, chewing tobacco, or electronic cigarettes.  If you are pregnant, do not drink alcohol.  If you are breastfeeding, limit how much and how often you drink alcohol.  Limit alcohol intake to no more than 1 drink per day for nonpregnant women. One drink equals 12 ounces of beer, 5 ounces of Atilano Covelli, or 1 ounces of hard liquor.  Do not use street drugs.  Do not share  needles.  Ask your health care provider for help if you need support or information about quitting drugs.  Tell your health care provider if you often feel depressed.  Tell your health care  provider if you have ever been abused or do not feel safe at home. This information is not intended to replace advice given to you by your health care provider. Make sure you discuss any questions you have with your health care provider. Document Released: 08/04/2010 Document Revised: 06/27/2015 Document Reviewed: 10/23/2014 [employer]vier Interactive Patient Education  2019 [employer].

## 2018-04-04 NOTE — Progress Notes (Addendum)
Subjective:   Heather Crawford is a 73 y.o. female who presents for Medicare Annual (Subsequent) preventive examination.  Review of Systems:  No ROS.  Medicare Wellness Visit. Additional risk factors are reflected in the social history.    Sleep patterns: feels rested on waking, gets up 1 times nightly to void and sleeps 7-8 hours nightly.    Home Safety/Smoke Alarms: Feels safe in home. Smoke alarms in place.  Living environment; residence and Firearm Safety: 1-story house/ trailer. Lives with husband, no needs for DME, good support system Seat Belt Safety/Bike Helmet: Wears seat belt.     Objective:     Vitals: BP (!) 142/78   Pulse 66   Resp 17   Ht 5\' 3"  (1.6 m)   Wt 176 lb (79.8 kg)   SpO2 98%   BMI 31.18 kg/m   Body mass index is 31.18 kg/m.  Advanced Directives 04/04/2018 02/08/2017 07/13/2016 11/16/2015 11/16/2015 10/22/2015 04/25/2015  Does Patient Have a Medical Advance Directive? Yes Yes Yes No No Yes Yes  Type of Paramedic of [place];Living will [place];Living will [place];Living will - - - -  Does patient want to make changes to medical advance directive? - - - - - - -  Copy of [place] in Chart? No - copy requested No - copy requested - - - - -  Would patient like information on creating a medical advance directive? - - - No - patient declined information No - patient declined information - -  Pre-existing out of facility DNR order (yellow form or pink MOST form) - - - - - - -    Tobacco Social History   Tobacco Use  Smoking Status Former Smoker  . Packs/day: 1.00  . Years: 40.00  . Pack years: 40.00  . Types: Cigarettes  . Last attempt to quit: 02/03/2000  . Years since quitting: 18.1  Smokeless Tobacco Former Systems developer  . Types: Chew  Tobacco Comment   discussed LDCT      Counseling given: Not Answered Comment: discussed LDCT   Past Medical History:  Diagnosis Date    . Allergic rhinitis, cause unspecified   . Aortic stenosis    a.  echo 1/12 w/mildly dilated LV, EF 45-50% w/paradoxical septal motion consistent w/ LBBB, moderate aortic stenosis w/mean gradient 27 mmHg, trivial AI, mild to moderate MR w/calcified mitral valve;   b. s/p AVR with 21 mm Magna Ease valve  . Aortic valve replaced 2012  . Arthritis    joint pain  . CAD (coronary artery disease)    a. s/p CABG 4/12: RIMA-RCA  . CAD, multiple vessel    sees Dr Aundra Dubin every 3-4 months  . Cancer ([place])    skin sarcomas removed  . CHF (congestive heart failure) (HCC)    chronic diastolic CHF  . Chronic back pain    H/O BACK SURGERY  . Coronary artery disease    adenosine myoview 2/12 w/EF 56%, ISCHEMIA W/SCAR IN THE MID TO APICAL ANTERIO WALL, SEPTAL WALL, AND APEX. LHC 3/12 W/EF 55%, 40% OSTIAL RCA, 60% MID RCA, 95% DISTAL RC A, INTERVENTION  ATTEMPTED BUT UNABLE TO ADEQUADATELY SEAT  CATHETER.  . Diverticulitis of colon (without mention of hemorrhage)(562.11)   . Diverticulosis   . Esophageal reflux   . Glucose intolerance (impaired glucose tolerance)    A1C 6.3 (4/12)  . H/O hiatal hernia   . H/O vertigo    with  fluid  in ears  . Heart murmur   . Hemorrhoids   . Hemorrhoids   . History of rheumatic fever   . Hx of hysterectomy   . Hx: UTI (urinary tract infection)   . Hyperlipidemia   . LBBB (left bundle branch block)   . Myocardial infarction ([place]) 2002  . Pneumonia   . PONV (postoperative nausea and vomiting)   . 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 Snoring   . Urinary frequency    Past Surgical History:  Procedure Laterality Date  . ABDOMINAL HYSTERECTOMY    . APPENDECTOMY  1973  . BACK SURGERY     Lumbar Lam and discectomy x 2  . BREAST BIOPSY Right 10/2011  . BREAST LUMPECTOMY Right   . CARDIAC CATHETERIZATION  2012  . CARDIAC VALVE REPLACEMENT  2012   Aortic  . CORONARY ARTERY BYPASS GRAFT  2012  . DILATION AND CURETTAGE OF UTERUS    . ENDOMETRIAL ABLATION    .  LAPAROSCOPIC LYSIS INTESTINAL ADHESIONS    . LUMBAR [place]  . SKIN SURGERY     skin sarcomas removed  . TONSILLECTOMY     1955  . TONSILLECTOMY     Family History  Problem Relation Age of Onset  . Hypertension Mother   . Breast cancer Mother   . Hypothyroidism Mother   . Diabetes type II Mother   . Heart disease Mother   . Heart attack Father        CABG @ 80  . CAD Father   . Breast cancer Sister        x 2  . Colon cancer Maternal Aunt   . Breast cancer Maternal Aunt   . Colon cancer Paternal Uncle   . Pancreatic cancer Other        PGA  . Breast cancer Paternal Grandmother    Social History   Socioeconomic History  . Marital status: Married    Spouse name: Not on file  . Number of children: 2  . Years of education: Not on file  . Highest education level: Not on file  Occupational History  . Occupation: retired    Fish farm manager: RETIRED    Comment: [employer], TEFL teacher, [employer] TEACHER  Social Needs  . Financial resource strain: Not hard at all  . Food insecurity:    Worry: Never true    Inability: Never true  . Transportation needs:    Medical: No    Non-medical: No  Tobacco Use  . Smoking status: Former Smoker    Packs/day: 1.00    Years: 40.00    Pack years: 40.00    Types: Cigarettes    Last attempt to quit: 02/03/2000    Years since quitting: 18.1  . Smokeless tobacco: Former Systems developer    Types: Chew  . Tobacco comment: discussed LDCT   Substance and Sexual Activity  . Alcohol use: No    Alcohol/week: 0.0 standard drinks    Frequency: Never  . Drug use: No  . Sexual activity: Yes  Lifestyle  . Physical activity:    Days per week: 0 days    Minutes per session: 0 min  . Stress: Not at all  Relationships  . Social connections:    Talks on phone: More than three times a week    Gets together: More than three times a week    Attends religious service: More  than 4 times per year    Active member of club  or organization: Yes    Attends meetings of clubs or organizations: More than 4 times per year    Relationship status: Married  Other Topics Concern  . Not on file  Social History Narrative   MARRIED   1 SON 1 DAUGHTER 2 GRANDCHILDREN   Music therapist, NOW FULL TIME CAREGIVER   LIVES IN [place], GREW UP IN [place].    Outpatient Encounter Medications as of 04/04/2018  Medication Sig  . aspirin EC 81 MG tablet Take 1 tablet (81 mg total) by mouth daily. (Patient taking differently: Take 325 mg by mouth daily. )  . azithromycin (ZITHROMAX) 500 MG tablet Use as directed one (1) hour prior to dental procedures.  . calcium gluconate 500 MG tablet Take 1 tablet by mouth daily.  . carvedilol (COREG) 6.25 MG tablet Take 1 tablet (6.25 mg total) by mouth 2 (two) times daily with a meal.  . cetirizine (ZYRTEC) 10 MG tablet Take 10 mg by mouth daily. Alternates every 3 months with Claritin. Rotates every 3 months  . Cholecalciferol 2000 units TABS Take 1 tablet (2,000 Units total) by mouth daily.  . fluticasone (FLONASE) 50 MCG/ACT nasal spray PLACE 1 SPRAY INTO BOTH NOSTRILS DAILY. (Patient taking differently: Place 1 spray into both nostrils as needed. )  . furosemide (LASIX) 20 MG tablet Take 1 tablet by mouth daily as needed for edema or weight gain Take 10 mEq potassium when you take lasix  . loratadine (CLARITIN) 10 MG tablet Take 10 mg by mouth daily. Alternates every 3 months with Zyrtec.  . Omega-3 Fatty Acids (FISH OIL) 1000 MG CAPS Take 2,000 mg by mouth daily.  Marland Kitchen omeprazole (PRILOSEC) 20 MG capsule Take 1 capsule (20 mg total) by mouth daily.  . potassium chloride (K-DUR,KLOR-CON) 10 MEQ tablet Take 1 tablet by mouth on the days you take lasix (furosemide)  . simvastatin (ZOCOR) 40 MG tablet Take 1 tablet (40 mg total) by mouth at bedtime.   No facility-administered encounter medications on file as of 04/04/2018.     Activities of Daily Living In your present state of health, do you  have any difficulty performing the following activities: 04/04/2018  Hearing? N  Vision? N  Difficulty concentrating or making decisions? N  Walking or climbing stairs? N  Dressing or bathing? N  Doing errands, shopping? N  Preparing Food and eating ? N  Using the Toilet? N  In the past six months, have you accidently leaked urine? N  Do you have problems with loss of bowel control? N  Managing your Medications? N  Managing your Finances? N  Housekeeping or managing your Housekeeping? N  Some recent data might be hidden    Patient Care Team: Janith Lima, MD as PCP - General (Internal Medicine) Melrose Nakayama, MD (Cardiothoracic Surgery) End, Harrell Gave, MD as Consulting Physician (Cardiology) Latanya Maudlin, MD as Consulting Physician (Orthopedic Surgery)    Assessment:   This is a routine wellness examination for Nicole. Physical assessment deferred to PCP.  Exercise Activities and Dietary recommendations Current Exercise Habits: The patient does not participate in regular exercise at present Diet (meal preparation, eat out, water intake, caffeinated beverages, dairy products, fruits and vegetables): in general, a "healthy" diet  , well balanced   Reviewed heart healthy and diabetic diet. Encouraged patient to increase daily water and healthy fluid intake.  Goals    . Exercise 150 minutes per week (moderate  activity)     Will start walking x 30 minutes 3 days a week  Will build up to this time and then move on.  Keep journal    . Patient Stated     Lose weight, I will watch what I eat by lowering sugar and carbohydrates, start to exercise, and join [employer].    . Patient Stated     Stay active as possible by walking around [place], doing house work, and going to [employer].    . Weight (lb) < 160 lb (72.6 kg)     Will try again to lose some weight;  Will drink more water       Fall Risk Fall Risk  04/04/2018 02/08/2017 11/16/2015 10/22/2015  10/17/2014  Falls in the past year? 0 Yes No No No  Number falls in past yr: 0 1 - - -    Depression Screen PHQ 2/9 Scores 04/04/2018 02/08/2017 11/16/2015 10/22/2015  PHQ - 2 Score 0 0 0 0  PHQ- 9 Score - 1 - -     Cognitive Function MMSE - Mini Mental State Exam 02/08/2017 10/22/2015 10/11/2014  Not completed: - (No Data) Unable to complete  Orientation to time 5 - -  Orientation to Place 5 - -  Registration 3 - -  Attention/ Calculation 5 - -  Recall 2 - -  Language- name 2 objects 2 - -  Language- repeat 1 - -  Language- follow 3 step command 3 - -  Language- read & follow direction 1 - -  Write a sentence 1 - -  Copy design 1 - -  Total score 29 - -       Ad8 score reviewed for issues:  Issues making decisions: no  Less interest in hobbies / activities: no  Repeats questions, stories (family complaining): no  Trouble using ordinary gadgets (microwave, computer, phone):no  Forgets the month or year: no  Mismanaging finances: no  Remembering appts: no  Daily problems with thinking and/or memory: no Ad8 score is= 0  Immunization History  Administered Date(s) Administered  . Hep A / Hep B 10/17/2014, 04/16/2015  . Hepatitis B, adult 11/16/2014  . Pneumococcal Conjugate-13 08/16/2013  . Pneumococcal Polysaccharide-23 08/12/2011  . Td 02/21/2010  . Tdap 02/21/2010   Screening Tests Health Maintenance  Topic Date Due  . HEMOGLOBIN A1C  08/17/2017  . OPHTHALMOLOGY EXAM  10/03/2017  . FOOT EXAM  02/08/2018  . URINE MICROALBUMIN  02/17/2018  . INFLUENZA VACCINE  05/04/2018 (Originally 09/02/2017)  . MAMMOGRAM  02/10/2019  . TETANUS/TDAP  02/22/2020  . COLONOSCOPY  04/29/2020  . DEXA SCAN  Completed  . Hepatitis C Screening  Completed  . PNA vac Low Risk Adult  Completed      Plan:   Reviewed health maintenance screenings with patient today and relevant education, vaccines, and/or referrals were provided.   Continue doing brain stimulating activities  (puzzles, reading, adult coloring books, staying active) to keep memory sharp.   Continue to eat heart healthy diet (full of fruits, vegetables, whole grains, lean protein, water--limit salt, fat, and sugar intake) and increase physical activity as tolerated.  I have personally reviewed and noted the following in the patient's chart:   . Medical and social history . Use of alcohol, tobacco or illicit drugs  . Current medications and supplements . Functional ability and status . Nutritional status . Physical activity . Advanced directives . List of other physicians . Vitals . Screenings to include cognitive,  depression, and falls . Referrals and appointments  In addition, I have reviewed and discussed with patient certain preventive protocols, quality metrics, and best practice recommendations. A written personalized care plan for preventive services as well as general preventive health recommendations were provided to patient.     Michiel Cowboy, RN  04/04/2018  Medical screening examination/treatment/procedure(s) were performed by non-physician practitioner and as supervising physician I was immediately available for consultation/collaboration. I agree with above. Scarlette Calico, MD

## 2018-04-04 NOTE — Addendum Note (Signed)
Addended by: Emelia Loron A on: 04/04/2018 10:38 AM   Modules accepted: Orders

## 2018-04-08 ENCOUNTER — Ambulatory Visit: Payer: Medicare HMO | Admitting: Physician Assistant

## 2018-04-08 ENCOUNTER — Encounter: Payer: Self-pay | Admitting: Physician Assistant

## 2018-04-08 VITALS — BP 164/60 | HR 59 | Ht 63.0 in | Wt 177.0 lb

## 2018-04-08 DIAGNOSIS — I10 Essential (primary) hypertension: Secondary | ICD-10-CM | POA: Diagnosis not present

## 2018-04-08 DIAGNOSIS — I38 Endocarditis, valve unspecified: Secondary | ICD-10-CM | POA: Diagnosis not present

## 2018-04-08 DIAGNOSIS — I2510 Atherosclerotic heart disease of native coronary artery without angina pectoris: Secondary | ICD-10-CM

## 2018-04-08 DIAGNOSIS — I5032 Chronic diastolic (congestive) heart failure: Secondary | ICD-10-CM | POA: Diagnosis not present

## 2018-04-08 DIAGNOSIS — I739 Peripheral vascular disease, unspecified: Secondary | ICD-10-CM | POA: Diagnosis not present

## 2018-04-08 DIAGNOSIS — I779 Disorder of arteries and arterioles, unspecified: Secondary | ICD-10-CM

## 2018-04-08 MED ORDER — ASPIRIN EC 81 MG PO TBEC: 81.0000 mg | DELAYED_RELEASE_TABLET | Freq: Every day | ORAL | 3 refills | Status: DC

## 2018-04-08 NOTE — Patient Instructions (Signed)
Medication Instructions:  Your physician has recommended you make the following change in your medication:  1. DECREASE ASPIRIN TO 81 MG DAILY.  If you need a refill on your cardiac medications before your next appointment, please call your pharmacy.   Lab work: NONE If you have labs (blood work) drawn today and your tests are completely normal, you will receive your results only by: Marland Kitchen MyChart Message (if you have MyChart) OR . A paper copy in the mail If you have any lab test that is abnormal or we need to change your treatment, we will call you to review the results.  Testing/Procedures: NONE  Follow-Up: At [facility], you and your health needs are our priority.  As part of our continuing mission to provide you with exceptional heart care, we have created designated Provider Care Teams.  These Care Teams include your primary Cardiologist (physician) and Advanced Practice Providers (APPs -  Physician Assistants and Nurse Practitioners) who all work together to provide you with the care you need, when you need it. You will need a follow up appointment in:  6 months.  Please call our office 2 months in advance to schedule this appointment.  You may see Dr. Burt Knack or Richardson Dopp, PA-C   Any Other Special Instructions Will Be Listed Below (If Applicable). If your blood pressure consistently runs 140/90 or higher please call our office to let us know. [phone]

## 2018-04-08 NOTE — Progress Notes (Signed)
Cardiology Office Note:    Date:  04/08/2018   ID:  Heather Crawford, Heather Crawford [birth year], MRN [id number]  PCP:  Heather Lima, MD  Cardiologist:  Heather Mocha, MD / Heather Dopp, PA-C  Electrophysiologist:  None   Referring MD: Heather Lima, MD   Chief Complaint  Patient presents with  . Follow-up    Valve disease, CAD    History of Present Illness:    Heather Crawford is a 73 y.o. 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  She was previously followed by Dr. Aundra Crawford and last saw Dr. Saunders Crawford in December 2019.  She complained of atypical chest discomfort and shortness of breath.  Stress testing and repeat echocardiogram were discussed.  However, the patient declined at that time.  She had notable worsening of symptoms after stopping ranitidine and changing to famotidine.  Dr. Saunders Crawford placed her on omeprazole.   Heather Crawford returns for follow up.  She was dx with pneumonia shortly after her last visit.  I suspect her reported chest pain and shortness of breath at her last visit was an early sign of her pneumonia.  She is feeling better now.  She denies chest pain, significant shortness of breath, paroxysmal nocturnal dyspnea, syncope.  She has some dependent pedal edema.  She has a long hx of vertigo as welll.    Prior CV studies:   The following studies were reviewed today:  Echocardiogram 10/14/2016 Mild LVH, EF 60-65, Normally functioning bioprosthetic aortic valve replacement (mean 14, peak 26), mild MR, moderate LAE  Event monitor 07/21/2016  The patient was monitored for 29 days.  The predominant rhythm was sinus with an average rate of 69 bpm. Intermittent intraventricular conduction delay was noted.  There was no significant  ectopy, sustained arrhythmia, or prolonged pause.  Patient triggered events correspond to sinus rhythm, sinus rhythm with IVCD, and artifact. Predominantly sinus rhythm without significant arrhythmias. Intermittent intraventricular conduction delay was noted, consistent with the patient's history of left bundle branch block.  Echocardiogram 10/10/2015 EF 60-65, normal wall motion, grade 1 diastolic dysfunction, normally functioning bioprosthetic aortic valve replacement, mild MR, normal RV SF, PASP 27  Carotid US 10/20/2015 Bilateral ICA 1-39  Cardiac catheterization 04/03/2010 EF 55 RCA ostial 40-50, distal 90-95 LM normal LCx normal LAD mid 30 >> CABG  Past Medical History:  Diagnosis Date  . Allergic rhinitis, cause unspecified   . Aortic stenosis    a.  echo 1/12 w/mildly dilated LV, EF 45-50% w/paradoxical septal motion consistent w/ LBBB, moderate aortic stenosis w/mean gradient 27 mmHg, trivial AI, mild to moderate MR w/calcified mitral valve;   b. s/p AVR with 21 mm Magna Ease valve  . Aortic valve replaced 2012  . Arthritis    joint pain  . CAD (coronary artery disease)    a. s/p CABG 4/12: RIMA-RCA  . CAD, multiple vessel    sees Dr Heather Crawford every 3-4 months  . Cancer ([place])    skin sarcomas removed  . CHF (congestive heart failure) (HCC)    chronic diastolic CHF  . Chronic back pain    H/O BACK SURGERY  . Coronary artery disease    adenosine myoview 2/12 w/EF 56%, ISCHEMIA W/SCAR IN THE MID TO APICAL ANTERIO WALL, SEPTAL WALL, AND APEX. LHC 3/12 W/EF 55%,  40% OSTIAL RCA, 60% MID RCA, 95% DISTAL RC A, INTERVENTION  ATTEMPTED BUT UNABLE TO ADEQUADATELY SEAT  CATHETER.  . Diverticulitis of colon (without mention of hemorrhage)(562.11)   . Diverticulosis   . Esophageal reflux   . Glucose intolerance (impaired glucose tolerance)    A1C 6.3 (4/12)  . H/O hiatal hernia   . H/O vertigo    with fluid  in ears  . Heart murmur   . Hemorrhoids   . Hemorrhoids   . History of  rheumatic fever   . Hx of hysterectomy   . Hx: UTI (urinary tract infection)   . Hyperlipidemia   . LBBB (left bundle branch block)   . Myocardial infarction ([place]) 2002  . Pneumonia   . PONV (postoperative nausea and vomiting)   . 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 Snoring   . Urinary frequency   1. 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 HEART VALVE DISEASE: Echo (1/12) with mildly dilated LV, EF 45-50% with paradoxical septal motion consistent with LBBB, moderate aortic stenosis with mean gradient 27 mmHg, trivial AI, mild to moderate mitral regurgitation with calcified mitral valve. 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 Echo (5/12): EF 55-60%, basal inferior hypokinesis, moderate diastolic dysfunction, normally functioning bioprosthetic aortic valve with mean gradient 13 mmHg, normal RV. Echo (9/14) with EF 60-65%, bioprosthetic aortic valve with mean gradient 16 mmHg, no AI, mild-moderate MR. Echo (9/16) with EF 55-60%, bioprosthetic aortic valve normal. Echo (9/18) with LVEF 60-65% and stable mean gradient across bioprosthetic AVR since 2015. Mild MR. 4. LBBB 5. ALLERGIC RHINITIS  6. GERD 7. Hx of DIVERTICULITIS 8. Chronic low back pain with history of back surgery 9. Appendectomy 10. Hysterectomy 11. Hyperlipidemia: Myalgias with atorvastatin and Crestor.  12. CAD: Adenosine myoview (2/12) with EF 56%, ischemia with scar in the mid to apical anterior wall, septal wall, and apex. LHC (3/12) with EF 55%, 40% ostial RCA, 60% mid RCA, 95% distal RCA. Intervention attempted but unable to adequately seat catheter. Patient had CABG-AVR in 3/12 with RIMA-RCA.  13. Carotid dopplers (3/12): no significant stenosis. Carotid dopplers (9/14) with 40-59% bilateral ICA stenosis. Carotid dopplers (9/15) with 40-59% bilateral ICA stenosis. Carotid dopplers (9/16) with 1-39% BICA stenosis, >50% left subclavian stenosis.  14. Vertigo: labyrinthitis  15. Plantar  fasciitis   Surgical Hx: The patient  has a past surgical history that includes Appendectomy (1973); Tonsillectomy; Lumbar disc surgery; Abdominal hysterectomy; Dilation and curettage of uterus; Laparoscopic lysis intestinal adhesions; Endometrial ablation; Cardiac catheterization (2012); Coronary artery bypass graft (2012); Cardiac valve replacement (2012); Tonsillectomy; Back surgery; Breast biopsy (Right, 10/2011); Skin surgery; and Breast lumpectomy (Right).   Current Medications: Current Meds  Medication Sig  . albuterol (PROVENTIL HFA;VENTOLIN HFA) 108 (90 Base) MCG/ACT inhaler Inhale 1-2 puffs into the lungs as needed for wheezing.   Marland Kitchen azithromycin (ZITHROMAX) 500 MG tablet Use as directed one (1) hour prior to dental procedures.  . calcium gluconate 500 MG tablet Take 1 tablet by mouth daily.  . carvedilol (COREG) 6.25 MG tablet Take 1 tablet (6.25 mg total) by mouth 2 (two) times daily with a meal.  . cetirizine (ZYRTEC) 10 MG tablet Take 10 mg by mouth daily. Alternates every 3 months with Claritin. Rotates every 3 months  . Cholecalciferol 2000 units TABS Take 1 tablet (2,000 Units total) by mouth daily.  . fluticasone (FLONASE) 50 MCG/ACT nasal spray PLACE 1 SPRAY INTO BOTH NOSTRILS DAILY.  . furosemide (  LASIX) 20 MG tablet Take 1 tablet by mouth daily as needed for edema or weight gain Take 10 mEq potassium when you take lasix  . loratadine (CLARITIN) 10 MG tablet Take 10 mg by mouth daily. Alternates every 3 months with Zyrtec.  . meclizine (ANTIVERT) 12.5 MG tablet Take 1 tablet (12.5 mg total) by mouth 3 (three) times daily as needed for dizziness.  . Omega-3 Fatty Acids (FISH OIL) 1000 MG CAPS Take 2,000 mg by mouth daily.  Marland Kitchen omeprazole (PRILOSEC) 20 MG capsule Take 1 capsule (20 mg total) by mouth daily.  . potassium chloride (K-DUR,KLOR-CON) 10 MEQ tablet Take 1 tablet by mouth on the days you take lasix (furosemide)  . simvastatin (ZOCOR) 40 MG tablet Take 1 tablet (40 mg  total) by mouth at bedtime.  . [DISCONTINUED] aspirin 325 MG EC tablet Take 325 mg by mouth daily.  . [DISCONTINUED] aspirin EC 81 MG tablet Take 1 tablet (81 mg total) by mouth daily. (Patient taking differently: Take 325 mg by mouth daily. )     Allergies:   Amoxicillin; Crestor [rosuvastatin]; Lipitor [atorvastatin]; Neomycin; Sulfamethoxazole-trimethoprim; Tape; Neosporin [neomycin-bacitracin zn-polymyx]; and Polysporin [bacitracin-polymyxin b]   Social History   Tobacco Use  . Smoking status: Former Smoker    Packs/day: 1.00    Years: 40.00    Pack years: 40.00    Types: Cigarettes    Last attempt to quit: 02/03/2000    Years since quitting: 18.1  . Smokeless tobacco: Former Systems developer    Types: Chew  . Tobacco comment: discussed LDCT   Substance Use Topics  . Alcohol use: No    Alcohol/week: 0.0 standard drinks    Frequency: Never  . Drug use: No     Family Hx: The patient's family history includes Breast cancer in his maternal aunt, mother, paternal grandmother, and sister; CAD in his father; Colon cancer in his maternal aunt and paternal uncle; Diabetes type II in his mother; Heart attack in his father; Heart disease in his mother; Hypertension in his mother; Hypothyroidism in his mother; Pancreatic cancer in an other family member.  ROS:   Please see the history of present illness.    Review of Systems  Constitution: Positive for diaphoresis.  Musculoskeletal: Positive for back pain.   All other systems reviewed and are negative.   EKGs/Labs/Other Test Reviewed:    EKG:  EKG is  ordered today.  The ekg ordered today demonstrates normal sinus rhythm, HR 59, left bundle branch block.  Recent Labs: 01/06/2018: ALT 24; BUN 13; Creatinine, Ser 0.74; Potassium 4.8; Sodium 138   Recent Lipid Panel Lab Results  Component Value Date/Time   CHOL 150 01/06/2018 11:13 AM   TRIG 237 (H) 01/06/2018 11:13 AM   HDL 42 01/06/2018 11:13 AM   CHOLHDL 3.6 01/06/2018 11:13 AM   CHOLHDL  4 02/17/2017 01:53 PM   LDLCALC 61 01/06/2018 11:13 AM   LDLDIRECT 79 01/06/2018 11:13 AM   LDLDIRECT 101.0 02/17/2017 01:53 PM    Physical Exam:    VS:  BP (!) 164/60   Pulse (!) 59   Ht 5\' 3"  (1.6 m)   Wt 177 lb (80.3 kg)   SpO2 96%   BMI 31.35 kg/m     Wt Readings from Last 3 Encounters:  04/08/18 177 lb (80.3 kg)  04/04/18 176 lb (79.8 kg)  01/06/18 171 lb (77.6 kg)     Physical Exam  Constitutional: He is oriented to person, place, and time. He appears well-developed and  well-nourished. No distress.  HENT:  Head: Normocephalic and atraumatic.  Eyes: No scleral icterus.  Neck: Neck supple. No JVD present. No thyromegaly present.  Cardiovascular: Normal rate, regular rhythm, S1 normal and S2 normal.  Murmur heard.  Early systolic murmur is present with a grade of 2/6 at the upper right sternal border. Pulmonary/Chest: Effort normal. He has no rales.  Abdominal: Soft. There is no hepatomegaly.  Musculoskeletal:        General: No edema.  Lymphadenopathy:    He has no cervical adenopathy.  Neurological: He is alert and oriented to person, place, and time.  Skin: Skin is warm and dry.  Psychiatric: He has a normal mood and affect.    ASSESSMENT & PLAN:    Chronic diastolic heart failure (HCC) Normal EF by Echo in 2018.  NYHA 2.  Volume status stable.  Continue current Rx.  Coronary artery disease involving native coronary artery of native heart without angina pectoris  Hx of 1v CABG in 2012.  She has not had any anginal symptoms. We discussed the rationale for taking only ASA 81 mg once daily.    -Decrease ASA to 81 mg  -Continue statin, beta-blocker   Valvular heart disease s/p AVR in 2012  Normally functioning AVR by Echo in 2018.  Mild MR at that time.  No symptoms to suggest any significant changes.  Continue subacute bacterial endocarditis prophylaxis.  Consider follow up echocardiogram in 1 year.    Essential hypertension BP elevated.  She typically has  high BP in clinic.  Her BP at home is typically 130/80s.  Continue to monitor.    Bilateral carotid artery disease, unspecified type ([place])  Mild bilat ICA plaque by [place] in 2017.  Continue ASA, statin.      Dispo:  We discussed establishing with Dr. Harrell Gave or Dr. Margaretann Loveless at [facility] vs remaining at [facility] with me and Dr. Burt Knack.  We decided she would remain established here at [facility].   Return in about 6 months (around 10/09/2018) for Routine Follow Up, w/ Dr. Burt Knack, or Heather Dopp, PA-C.   Medication Adjustments/Labs and Tests Ordered: Current medicines are reviewed at length with the patient today.  Concerns regarding medicines are outlined above.  Tests Ordered: Orders Placed This Encounter  Procedures  . EKG 12-Lead   Medication Changes: Meds ordered this encounter  Medications  . aspirin EC 81 MG tablet    Sig: Take 1 tablet (81 mg total) by mouth daily.    Dispense:  90 tablet    Refill:  3    Signed, Heather Dopp, PA-C  04/08/2018 10:10 AM    [facility] [address], [place], [place]  [address] Phone: [phone]; Fax: [phone]

## 2018-04-20 ENCOUNTER — Other Ambulatory Visit: Payer: Self-pay | Admitting: Internal Medicine

## 2018-04-20 DIAGNOSIS — R600 Localized edema: Secondary | ICD-10-CM

## 2018-04-20 DIAGNOSIS — Z952 Presence of prosthetic heart valve: Secondary | ICD-10-CM

## 2018-05-03 ENCOUNTER — Ambulatory Visit: Payer: Medicare HMO | Admitting: Internal Medicine

## 2018-05-05 ENCOUNTER — Other Ambulatory Visit: Payer: Self-pay | Admitting: Internal Medicine

## 2018-05-05 DIAGNOSIS — J309 Allergic rhinitis, unspecified: Secondary | ICD-10-CM

## 2018-05-05 MED ORDER — FLUTICASONE PROPIONATE 50 MCG/ACT NA SUSP: 1.0000 | Freq: Every day | NASAL | 0 refills | Status: DC

## 2018-05-17 ENCOUNTER — Other Ambulatory Visit: Payer: Self-pay | Admitting: *Deleted

## 2018-05-17 DIAGNOSIS — J309 Allergic rhinitis, unspecified: Secondary | ICD-10-CM

## 2018-05-17 MED ORDER — FLUTICASONE PROPIONATE 50 MCG/ACT NA SUSP: 1.0000 | Freq: Every day | NASAL | 0 refills | Status: DC

## 2018-05-19 ENCOUNTER — Other Ambulatory Visit: Payer: Self-pay | Admitting: Internal Medicine

## 2018-05-19 DIAGNOSIS — J309 Allergic rhinitis, unspecified: Secondary | ICD-10-CM

## 2018-05-19 MED ORDER — FLUTICASONE PROPIONATE 50 MCG/ACT NA SUSP: 1.0000 | Freq: Every day | NASAL | 1 refills | Status: DC

## 2018-06-08 ENCOUNTER — Ambulatory Visit (INDEPENDENT_AMBULATORY_CARE_PROVIDER_SITE_OTHER): Payer: Medicare HMO | Admitting: Internal Medicine

## 2018-06-08 ENCOUNTER — Other Ambulatory Visit (INDEPENDENT_AMBULATORY_CARE_PROVIDER_SITE_OTHER): Payer: Medicare HMO

## 2018-06-08 ENCOUNTER — Other Ambulatory Visit: Payer: Self-pay

## 2018-06-08 ENCOUNTER — Encounter: Payer: Self-pay | Admitting: Internal Medicine

## 2018-06-08 VITALS — BP 144/76 | HR 60 | Temp 97.7°F | Resp 16 | Ht 63.0 in | Wt 178.0 lb

## 2018-06-08 DIAGNOSIS — I10 Essential (primary) hypertension: Secondary | ICD-10-CM

## 2018-06-08 DIAGNOSIS — E118 Type 2 diabetes mellitus with unspecified complications: Secondary | ICD-10-CM

## 2018-06-08 DIAGNOSIS — M67441 Ganglion, right hand: Secondary | ICD-10-CM | POA: Diagnosis not present

## 2018-06-08 DIAGNOSIS — R2231 Localized swelling, mass and lump, right upper limb: Secondary | ICD-10-CM | POA: Insufficient documentation

## 2018-06-08 LAB — MICROALBUMIN / CREATININE URINE RATIO
Creatinine,U: 137.6 mg/dL
Microalb Creat Ratio: 1.6 mg/g (ref 0.0–30.0)
Microalb, Ur: 2.2 mg/dL — ABNORMAL HIGH (ref 0.0–1.9)

## 2018-06-08 LAB — BASIC METABOLIC PANEL
BUN: 13 mg/dL (ref 6–23)
CO2: 29 mEq/L (ref 19–32)
Calcium: 8.9 mg/dL (ref 8.4–10.5)
Chloride: 103 mEq/L (ref 96–112)
Creatinine, Ser: 0.66 mg/dL (ref 0.40–1.20)
GFR: 87.34 mL/min (ref >60.00)
Glucose, Bld: 115 mg/dL — ABNORMAL HIGH (ref 70–99)
Potassium: 4.7 mEq/L (ref 3.5–5.1)
Sodium: 141 mEq/L (ref 135–145)

## 2018-06-08 LAB — HEMOGLOBIN A1C: Hgb A1c MFr Bld: 7.2 % — ABNORMAL HIGH (ref 4.6–6.5)

## 2018-06-08 LAB — TSH: TSH: 4.74 u[IU]/mL — ABNORMAL HIGH (ref 0.35–4.50)

## 2018-06-08 NOTE — Progress Notes (Signed)
Subjective:  Patient ID: Heather Crawford, female    DOB: 1945-04-19  Age: 73 y.o. MRN: [id number]  CC: Hypertension and Diabetes   HPI Heather Crawford presents for f/up - She complains of a painful nodule for the last 3 to 4 months in her right hand.  It is in the webspace between the thumb and the index finger.  She noticed that it developed after she had been using a hammer.  She said the pain from this lesion radiates up her right arm.  She tells me her blood pressure and blood sugar have been well controlled.  She has her baseline level of DOE but she only notices it when she is climbing a steep incline.  She denies any recent episodes of CP or diaphoresis.  Outpatient Medications Prior to Visit  Medication Sig Dispense Refill  . albuterol (PROVENTIL HFA;VENTOLIN HFA) 108 (90 Base) MCG/ACT inhaler Inhale 1-2 puffs into the lungs as needed for wheezing.     Marland Kitchen aspirin EC 81 MG tablet Take 1 tablet (81 mg total) by mouth daily. 90 tablet 3  . azithromycin (ZITHROMAX) 500 MG tablet Use as directed one (1) hour prior to dental procedures.    . calcium gluconate 500 MG tablet Take 1 tablet by mouth daily.    . carvedilol (COREG) 6.25 MG tablet TAKE 1 TABLET TWICE DAILY WITH MEALS 180 tablet 0  . cetirizine (ZYRTEC) 10 MG tablet Take 10 mg by mouth daily. Alternates every 3 months with Claritin. Rotates every 3 months    . Cholecalciferol 2000 units TABS Take 1 tablet (2,000 Units total) by mouth daily. 90 tablet 1  . fluticasone (FLONASE) 50 MCG/ACT nasal spray Place 1 spray into both nostrils daily. 48 g 1  . furosemide (LASIX) 20 MG tablet Take 1 tablet by mouth daily as needed for edema or weight gain Take 10 mEq potassium when you take lasix 90 tablet 3  . loratadine (CLARITIN) 10 MG tablet Take 10 mg by mouth daily. Alternates every 3 months with Zyrtec.    . meclizine (ANTIVERT) 12.5 MG tablet Take 1 tablet (12.5 mg total) by mouth 3 (three) times daily as needed for dizziness. 90  tablet 3  . Omega-3 Fatty Acids (FISH OIL) 1000 MG CAPS Take 2,000 mg by mouth daily.    Marland Kitchen omeprazole (PRILOSEC) 20 MG capsule Take 1 capsule (20 mg total) by mouth daily. 90 capsule 3  . potassium chloride (K-DUR,KLOR-CON) 10 MEQ tablet Take 1 tablet by mouth on the days you take lasix (furosemide) 90 tablet 3  . simvastatin (ZOCOR) 40 MG tablet Take 1 tablet (40 mg total) by mouth at bedtime. 90 tablet 1   No facility-administered medications prior to visit.     ROS Review of Systems  Constitutional: Negative for diaphoresis, fatigue and unexpected weight change.  HENT: Negative.   Eyes: Negative for visual disturbance.  Respiratory: Negative for cough, chest tightness, shortness of breath and wheezing.   Cardiovascular: Negative for chest pain, palpitations and leg swelling.  Gastrointestinal: Negative for abdominal pain, constipation, diarrhea, nausea and vomiting.  Endocrine: Negative for polydipsia, polyphagia and polyuria.  Genitourinary: Negative for difficulty urinating.  Musculoskeletal: Positive for arthralgias.  Skin: Negative.  Negative for color change and rash.  Neurological: Negative.  Negative for dizziness, weakness and light-headedness.  Hematological: Negative for adenopathy. Does not bruise/bleed easily.  Psychiatric/Behavioral: Negative.     Objective:  BP (!) 144/76 (BP Location: Left Arm, Patient Position: Sitting, Cuff Size: Normal)  Pulse 60   Temp 97.7 F (36.5 C) (Oral)   Resp 16   Ht 5\' 3"  (1.6 m)   Wt 178 lb (80.7 kg)   SpO2 94%   BMI 31.53 kg/m   BP Readings from Last 3 Encounters:  06/08/18 (!) 144/76  04/08/18 (!) 164/60  04/04/18 (!) 142/78    Wt Readings from Last 3 Encounters:  06/08/18 178 lb (80.7 kg)  04/08/18 177 lb (80.3 kg)  04/04/18 176 lb (79.8 kg)    Physical Exam Vitals signs reviewed.  Constitutional:      Appearance: She is obese. She is not ill-appearing or diaphoretic.  HENT:     Nose: Nose normal. No  congestion.     Mouth/Throat:     Pharynx: Oropharynx is clear. No oropharyngeal exudate.  Eyes:     General: No scleral icterus.    Conjunctiva/sclera: Conjunctivae normal.  Neck:     Musculoskeletal: Normal range of motion and neck supple. No muscular tenderness.  Cardiovascular:     Rate and Rhythm: Normal rate and regular rhythm.     Heart sounds: No murmur. No gallop.   Pulmonary:     Effort: Pulmonary effort is normal.     Breath sounds: No stridor. No wheezing, rhonchi or rales.  Abdominal:     General: Bowel sounds are normal.     Palpations: There is no mass.     Tenderness: There is no abdominal tenderness.  Musculoskeletal:       Hands:  Lymphadenopathy:     Cervical: No cervical adenopathy.     Lab Results  Component Value Date   WBC 7.0 02/17/2017   HGB 12.8 02/17/2017   HCT 38.5 02/17/2017   PLT 242.0 02/17/2017   GLUCOSE 115 (H) 06/08/2018   CHOL 150 01/06/2018   TRIG 237 (H) 01/06/2018   HDL 42 01/06/2018   LDLDIRECT 79 01/06/2018   LDLCALC 61 01/06/2018   ALT 24 01/06/2018   AST 17 01/06/2018   NA 141 06/08/2018   K 4.7 06/08/2018   CL 103 06/08/2018   CREATININE 0.66 06/08/2018   BUN 13 06/08/2018   CO2 29 06/08/2018   TSH 4.74 (H) 06/08/2018   INR 3.1 06/18/2010   HGBA1C 7.2 (H) 06/08/2018   MICROALBUR 2.2 (H) 06/08/2018    No results found.  Assessment & Plan:   Heather Crawford was seen today for hypertension and diabetes.  Diagnoses and all orders for this visit:  Essential hypertension- Her blood pressure is adequately well controlled.  Electrolytes and renal function are normal. -     Basic metabolic panel; Future -     TSH; Future  Type II diabetes mellitus with manifestations ([place])- Her A1c is at 7.2%.  Her blood sugars are adequately well controlled. 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 -     Ambulatory referral to Orthopedic Surgery  Other orders -     Cancel: Ambulatory referral to Orthopedic Surgery   I am having Heather Crawford. Heather Crawford maintain her loratadine, cetirizine, Cholecalciferol, azithromycin, Fish Oil, omeprazole, furosemide, potassium chloride, simvastatin, calcium gluconate, meclizine, albuterol, aspirin EC, carvedilol, and fluticasone.  No orders of the defined types were placed in  this encounter.    Follow-up: Return in about 6 months (around 12/09/2018).  Heather Calico, MD

## 2018-06-08 NOTE — Patient Instructions (Signed)
Type 2 Diabetes Mellitus, Diagnosis, Adult Type 2 diabetes (type 2 diabetes mellitus) is a long-term (chronic) disease. In type 2 diabetes, one or both of these problems may be present:  The pancreas does not make enough of a hormone called insulin.  Cells in the body do not respond properly to insulin that the body makes (insulin resistance). Normally, insulin allows blood sugar (glucose) to enter cells in the body. The cells use glucose for energy. Insulin resistance or lack of insulin causes excess glucose to build up in the blood instead of going into cells. As a result, high blood glucose (hyperglycemia) develops. What increases the risk? The following factors may make you more likely to develop type 2 diabetes:  Having a family member with type 2 diabetes.  Being overweight or obese.  Having an inactive (sedentary) lifestyle.  Having been diagnosed with insulin resistance.  Having a history of prediabetes, gestational diabetes, or polycystic ovary syndrome (PCOS).  Being of American-Indian, African-American, Hispanic/Latino, or Asian/Pacific Islander descent. What are the signs or symptoms? In the early stage of this condition, you may not have symptoms. Symptoms develop slowly and may include:  Increased thirst (polydipsia).  Increased hunger(polyphagia).  Increased urination (polyuria).  Increased urination during the night (nocturia).  Unexplained weight loss.  Frequent infections that keep coming back (recurring).  Fatigue.  Weakness.  Vision changes, such as blurry vision.  Cuts or bruises that are slow to heal.  Tingling or numbness in the hands or feet.  Dark patches on the skin (acanthosis nigricans). How is this diagnosed? This condition is diagnosed based on your symptoms, your medical history, a physical exam, and your blood glucose level. Your blood glucose may be checked with one or more of the following blood tests:  A fasting blood glucose (FBG)  test. You will not be allowed to eat (you will fast) for 8 hours or longer before a blood sample is taken.  A random blood glucose test. This test checks blood glucose at any time of day regardless of when you ate.  An A1c (hemoglobin A1c) blood test. This test provides information about blood glucose control over the previous 2-3 months.  An oral glucose tolerance test (OGTT). This test measures your blood glucose at two times: ? After fasting. This is your baseline blood glucose level. ? Two hours after drinking a beverage that contains glucose. You may be diagnosed with type 2 diabetes if:  Your FBG level is 126 mg/dL (7.0 mmol/L) or higher.  Your random blood glucose level is 200 mg/dL (11.1 mmol/L) or higher.  Your A1c level is 6.5% or higher.  Your OGTT result is higher than 200 mg/dL (11.1 mmol/L). These blood tests may be repeated to confirm your diagnosis. How is this treated? Your treatment may be managed by a specialist called an endocrinologist. Type 2 diabetes may be treated by following instructions from your health care provider about:  Making diet and lifestyle changes. This may include: ? Following an individualized nutrition plan that is developed by a diet and nutrition specialist (registered dietitian). ? Exercising regularly. ? Finding ways to manage stress.  Checking your blood glucose level as often as told.  Taking diabetes medicines or insulin daily. This helps to keep your blood glucose levels in the healthy range. ? If you use insulin, you may need to adjust the dosage depending on how physically active you are and what foods you eat. Your health care provider will tell you how to adjust your dosage.    Taking medicines to help prevent complications from diabetes, such as: ? Aspirin. ? Medicine to lower cholesterol. ? Medicine to control blood pressure. Your health care provider will set individualized treatment goals for you. Your goals will be based on  your age, other medical conditions you have, and how you respond to diabetes treatment. Generally, the goal of treatment is to maintain the following blood glucose levels:  Before meals (preprandial): 80-130 mg/dL (4.4-7.2 mmol/L).  After meals (postprandial): below 180 mg/dL (10 mmol/L).  A1c level: less than 7%. Follow these instructions at home: Questions to ask your health care provider  Consider asking the following questions: ? Do I need to meet with a diabetes educator? ? Where can I find a support group for people with diabetes? ? What equipment will I need to manage my diabetes at home? ? What diabetes medicines do I need, and when should I take them? ? How often do I need to check my blood glucose? ? What number can I call if I have questions? ? When is my next appointment? General instructions  Take over-the-counter and prescription medicines only as told by your health care provider.  Keep all follow-up visits as told by your health care provider. This is important.  For more information about diabetes, visit: ? American Diabetes Association (ADA): www.diabetes.org ? American Association of Diabetes Educators (AADE): www.diabeteseducator.org Contact a health care provider if:  Your blood glucose is at or above 240 mg/dL (13.3 mmol/L) for 2 days in a row.  You have been sick or have had a fever for 2 days or longer, and you are not getting better.  You have any of the following problems for more than 6 hours: ? You cannot eat or drink. ? You have nausea and vomiting. ? You have diarrhea. Get help right away if:  Your blood glucose is lower than 54 mg/dL (3.0 mmol/L).  You become confused or you have trouble thinking clearly.  You have difficulty breathing.  You have moderate or large ketone levels in your urine. Summary  Type 2 diabetes (type 2 diabetes mellitus) is a long-term (chronic) disease. In type 2 diabetes, the pancreas does not make enough of a  hormone called insulin, or cells in the body do not respond properly to insulin that the body makes (insulin resistance).  This condition is treated by making diet and lifestyle changes and taking diabetes medicines or insulin.  Your health care provider will set individualized treatment goals for you. Your goals will be based on your age, other medical conditions you have, and how you respond to diabetes treatment.  Keep all follow-up visits as told by your health care provider. This is important. This information is not intended to replace advice given to you by your health care provider. Make sure you discuss any questions you have with your health care provider. Document Released: 01/19/2005 Document Revised: 08/20/2016 Document Reviewed: 02/22/2015 Elsevier Interactive Patient Education  2019 Elsevier Inc.  

## 2018-06-14 DIAGNOSIS — M79644 Pain in right finger(s): Secondary | ICD-10-CM | POA: Diagnosis not present

## 2018-06-27 ENCOUNTER — Other Ambulatory Visit: Payer: Self-pay | Admitting: Internal Medicine

## 2018-06-27 DIAGNOSIS — J309 Allergic rhinitis, unspecified: Secondary | ICD-10-CM

## 2018-07-13 ENCOUNTER — Other Ambulatory Visit: Payer: Self-pay | Admitting: Internal Medicine

## 2018-07-13 DIAGNOSIS — E785 Hyperlipidemia, unspecified: Secondary | ICD-10-CM

## 2018-07-14 NOTE — Telephone Encounter (Signed)
Refill Request.  

## 2018-07-30 ENCOUNTER — Other Ambulatory Visit: Payer: Self-pay | Admitting: Cardiovascular Disease

## 2018-07-30 DIAGNOSIS — R600 Localized edema: Secondary | ICD-10-CM

## 2018-07-30 DIAGNOSIS — Z952 Presence of prosthetic heart valve: Secondary | ICD-10-CM

## 2018-08-01 NOTE — Telephone Encounter (Signed)
Please review for refill.  

## 2018-09-12 ENCOUNTER — Encounter (HOSPITAL_COMMUNITY): Payer: Self-pay

## 2018-09-12 ENCOUNTER — Emergency Department (HOSPITAL_COMMUNITY): Payer: Medicare HMO

## 2018-09-12 ENCOUNTER — Emergency Department (HOSPITAL_COMMUNITY)
Admission: EM | Admit: 2018-09-12 | Discharge: 2018-09-12 | Disposition: A | Discharge status: Home / Self Care | Payer: Medicare HMO | Attending: Emergency Medicine | Admitting: Emergency Medicine

## 2018-09-12 ENCOUNTER — Ambulatory Visit: Payer: Self-pay | Admitting: *Deleted

## 2018-09-12 ENCOUNTER — Other Ambulatory Visit: Payer: Self-pay

## 2018-09-12 DIAGNOSIS — I110 Hypertensive heart disease with heart failure: Secondary | ICD-10-CM | POA: Diagnosis not present

## 2018-09-12 DIAGNOSIS — R079 Chest pain, unspecified: Secondary | ICD-10-CM | POA: Diagnosis not present

## 2018-09-12 DIAGNOSIS — R1013 Epigastric pain: Secondary | ICD-10-CM

## 2018-09-12 DIAGNOSIS — Z7982 Long term (current) use of aspirin: Secondary | ICD-10-CM | POA: Diagnosis not present

## 2018-09-12 DIAGNOSIS — I2510 Atherosclerotic heart disease of native coronary artery without angina pectoris: Secondary | ICD-10-CM | POA: Diagnosis not present

## 2018-09-12 DIAGNOSIS — I509 Heart failure, unspecified: Secondary | ICD-10-CM | POA: Diagnosis not present

## 2018-09-12 DIAGNOSIS — Z87891 Personal history of nicotine dependence: Secondary | ICD-10-CM | POA: Diagnosis not present

## 2018-09-12 DIAGNOSIS — Z79899 Other long term (current) drug therapy: Secondary | ICD-10-CM | POA: Insufficient documentation

## 2018-09-12 LAB — URINALYSIS, ROUTINE W REFLEX MICROSCOPIC
Bacteria, UA: NONE SEEN
Bilirubin Urine: NEGATIVE
Glucose, UA: NEGATIVE mg/dL
Hgb urine dipstick: NEGATIVE
Ketones, ur: NEGATIVE mg/dL
Nitrite: NEGATIVE
Protein, ur: NEGATIVE mg/dL
Specific Gravity, Urine: 1.009 (ref 1.005–1.030)
pH: 6 (ref 5.0–8.0)

## 2018-09-12 LAB — COMPREHENSIVE METABOLIC PANEL
ALT: 23 U/L (ref 0–44)
AST: 20 U/L (ref 15–41)
Albumin: 4 g/dL (ref 3.5–5.0)
Alkaline Phosphatase: 42 U/L (ref 38–126)
Anion gap: 11 (ref 5–15)
BUN: 12 mg/dL (ref 8–23)
CO2: 26 mmol/L (ref 22–32)
Calcium: 9 mg/dL (ref 8.9–10.3)
Chloride: 102 mmol/L (ref 98–111)
Creatinine, Ser: 0.65 mg/dL (ref 0.44–1.00)
GFR calc Af Amer: >60 mL/min (ref >60)
GFR calc non Af Amer: >60 mL/min (ref >60)
Glucose, Bld: 121 mg/dL — ABNORMAL HIGH (ref 70–99)
Potassium: 4 mmol/L (ref 3.5–5.1)
Sodium: 139 mmol/L (ref 135–145)
Total Bilirubin: 0.8 mg/dL (ref 0.3–1.2)
Total Protein: 6.6 g/dL (ref 6.5–8.1)

## 2018-09-12 LAB — CBC
HCT: 41.2 % (ref 36.0–46.0)
Hemoglobin: 13.2 g/dL (ref 12.0–15.0)
MCH: 30 pg (ref 26.0–34.0)
MCHC: 32 g/dL (ref 30.0–36.0)
MCV: 93.6 fL (ref 80.0–100.0)
Platelets: 211 10*3/uL (ref 150–400)
RBC: 4.4 MIL/uL (ref 3.87–5.11)
RDW: 12.2 % (ref 11.5–15.5)
WBC: 6.2 10*3/uL (ref 4.0–10.5)
nRBC: 0 % (ref 0.0–0.2)

## 2018-09-12 LAB — TROPONIN I (HIGH SENSITIVITY)
Troponin I (High Sensitivity): 3 ng/L (ref <18)
Troponin I (High Sensitivity): 4 ng/L (ref <18)

## 2018-09-12 LAB — LIPASE, BLOOD: Lipase: 27 U/L (ref 11–51)

## 2018-09-12 MED ORDER — ALUM & MAG HYDROXIDE-SIMETH 200-200-20 MG/5ML PO SUSP
30.0000 mL | Freq: Once | ORAL | Status: AC
  Administered 2018-09-12: 30 mL via ORAL
  Filled 2018-09-12: qty 30

## 2018-09-12 MED ORDER — LIDOCAINE VISCOUS HCL 2 % MT SOLN
15.0000 mL | Freq: Once | OROMUCOSAL | Status: AC
  Administered 2018-09-12: 17:00:00 15 mL via ORAL
  Filled 2018-09-12: qty 15

## 2018-09-12 MED ORDER — SODIUM CHLORIDE 0.9% FLUSH: 3.0000 mL | Freq: Once | INTRAVENOUS | Status: DC

## 2018-09-12 NOTE — ED Notes (Signed)
Patient verbalizes understanding of discharge instructions. Opportunity for questioning and answers were provided. Armband removed by staff, pt discharged from ED ambulatory to home.  

## 2018-09-12 NOTE — ED Triage Notes (Signed)
Pt arrives POV for eval of epigastric pain onset this AM. Reports sweats, and nausea. States pain radiates through back. Denies hx of abd surgeries. Denies chest pain/SOB

## 2018-09-12 NOTE — Telephone Encounter (Signed)
Chest pain starting at bottom of ribs and goes into the chest. Patient had sweats 3-4 am. Patient went back to sleep- patient is tired and has back pain.Patient is not sure what is going on- but she almost went to the hospital last weekend for the same pain- advised patient to go today- she will go- but bot by EMS   Reason for Disposition . [1] Chest pain lasts > 5 minutes AND [2] history of heart disease (i.e., angina, heart attack, heart failure, bypass surgery, takes nitroglycerin)  Answer Assessment - Initial Assessment Questions 1. LOCATION: "Where does it hurt?"       Starts at bottom and moves into the back 2. RADIATION: "Does the pain go anywhere else?" (e.g., into neck, jaw, arms, back)     Under breat and in back- some jaw pain when walks 3. ONSET: "When did the chest pain begin?" (Minutes, hours or days)      1 week ago aturday 4. PATTERN "Does the pain come and go, or has it been constant since it started?"  "Does it get worse with exertion?"      More constant- yes 5. DURATION: "How long does it last" (e.g., seconds, minutes, hours)     hours 6. SEVERITY: "How bad is the pain?"  (e.g., Scale 1-10; mild, moderate, or severe)    - MILD (1-3): doesn't interfere with normal activities     - MODERATE (4-7): interferes with normal activities or awakens from sleep    - SEVERE (8-10): excruciating pain, unable to do any normal activities       5- moderate 7. CARDIAC RISK FACTORS: "Do you have any history of heart problems or risk factors for heart disease?" (e.g., angina, prior heart attack; diabetes, high blood pressure, high cholesterol, smoker, or strong family history of heart disease)     Heart surgery- 2012 8. PULMONARY RISK FACTORS: "Do you have any history of lung disease?"  (e.g., blood clots in lung, asthma, emphysema, birth control pills)     no 9. CAUSE: "What do you think is causing the chest pain?"     Not sure 10. OTHER SYMPTOMS: "Do you have any other symptoms?"  (e.g., dizziness, nausea, vomiting, sweating, fever, difficulty breathing, cough)       Sweating, "Knot at throat"- feels like 11. PREGNANCY: "Is there any chance you are pregnant?" "When was your last menstrual period?"       n/a  Protocols used: CHEST PAIN-A-AH

## 2018-09-12 NOTE — Discharge Instructions (Signed)
You were seen in the ED today for abdominal pain/pain underneath your left breast Your labwork, EKG, and chest x ray were all reassuring today Please follow up with your PCP or Dr. Hilarie Fredrickson regarding your pain. You may need another endoscopy to evaluate your stomach.  Continue taking your medications as prescribed until you discuss with Dr. Hilarie Fredrickson if you need to increase your prilosec Return to the ED for any worsening symptoms

## 2018-09-12 NOTE — ED Provider Notes (Signed)
[facility] EMERGENCY DEPARTMENT Provider Note   CSN: [id number] Arrival date & time: 09/12/18  1133    History   Chief Complaint Chief Complaint  Patient presents with   Abdominal Pain    HPI Heather Crawford is a 73 y.o. female with PMHx aortic stenosis s/p valve replacement, CAD s/p CABG, CHF with EF 60-65% (10/14/2016), who presents to the ED today complaining of gradual onset, constant, achy, pain underneath left breast and epigastric pain that began around 4 AM this morning. Pt is very vague regarding symptoms. Reports that she had similar pain last week that resolved. She did not come to the ED at that point in time as she was concerned she would catch covid 19. She states she "sometimes feels nauseated" but unable to say if she felt nauseated today. Denies fever, chills, shortness of breath, diarrhea, constipation, or any other associated symptoms. No hx DVT/PE. No estrogen therapy. No recent prolonged travel or immobilization.       Past Medical History:  Diagnosis Date   Allergic rhinitis, cause unspecified    Aortic stenosis    a.  echo 1/12 w/mildly dilated LV, EF 45-50% w/paradoxical septal motion consistent w/ LBBB, moderate aortic stenosis w/mean gradient 27 mmHg, trivial AI, mild to moderate MR w/calcified mitral valve;   b. s/p AVR with 21 mm Magna Ease valve   Aortic valve replaced 2012   Arthritis    joint pain   CAD (coronary artery disease)    a. s/p CABG 4/12: RIMA-RCA   CAD, multiple vessel    sees Dr Aundra Dubin every 3-4 months   Cancer [facility])    skin sarcomas removed   CHF (congestive heart failure) (HCC)    chronic diastolic CHF   Chronic back pain    H/O BACK SURGERY   Coronary artery disease    adenosine myoview 2/12 w/EF 56%, ISCHEMIA W/SCAR IN THE MID TO APICAL ANTERIO WALL, SEPTAL WALL, AND APEX. LHC 3/12 W/EF 55%, 40% OSTIAL RCA, 60% MID RCA, 95% DISTAL RC A, INTERVENTION  ATTEMPTED BUT UNABLE TO ADEQUADATELY SEAT   CATHETER.   Diverticulitis of colon (without mention of hemorrhage)(562.11)    Diverticulosis    Esophageal reflux    Glucose intolerance (impaired glucose tolerance)    A1C 6.3 (4/12)   H/O hiatal hernia    H/O vertigo    with fluid  in ears   Heart murmur    Hemorrhoids    Hemorrhoids    History of rheumatic fever    Hx of hysterectomy    Hx: UTI (urinary tract infection)    Hyperlipidemia    LBBB (left bundle branch block)    Myocardial infarction ([place]) 2002   Pneumonia    PONV (postoperative nausea and vomiting)    PPD positive    in the past   Snoring    Urinary frequency     Patient Active Problem List   Diagnosis Date Noted   Mass of right hand 06/08/2018   Ganglion of flexor tendon sheath of right thumb 06/08/2018   Vertigo, benign paroxysmal, bilateral 04/04/2018   Rheumatic heart disease 05/22/2017   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   Colon cancer screening 10/17/2014   Hypertriglyceridemia 08/16/2013   Osteopenia 08/16/2013   Type II diabetes mellitus with manifestations ([place]) 08/16/2013   Bilateral carotid artery disease ([place]) 08/15/2012   Atypical lobular hyperplasia of breast 10/08/2011   Routine health maintenance 08/13/2011   Chronic diastolic heart failure ([place]) 10/20/2010  Coronary artery disease involving native coronary artery of native heart without angina pectoris 04/18/2010   Essential hypertension 03/26/2010   Hyperlipidemia LDL goal <70 02/20/2010   Valvular heart disease s/p AVR in 2012 02/07/2010   ALLERGIC RHINITIS 02/07/2010   GERD 02/07/2010    Past Surgical History:  Procedure Laterality Date   ABDOMINAL HYSTERECTOMY     APPENDECTOMY  1973   BACK SURGERY     Lumbar Lam and discectomy x 2   BREAST BIOPSY Right 10/2011   BREAST LUMPECTOMY Right    CARDIAC CATHETERIZATION  2012   CARDIAC VALVE REPLACEMENT  2012   Aortic   CORONARY ARTERY BYPASS GRAFT  2012   DILATION AND  CURETTAGE OF UTERUS     ENDOMETRIAL ABLATION     LAPAROSCOPIC LYSIS INTESTINAL ADHESIONS     LUMBAR [place] SURGERY     DR. [facility]- [place]   SKIN SURGERY     skin sarcomas removed   TONSILLECTOMY     1955   TONSILLECTOMY       OB History   No obstetric history on file.      Home Medications    Prior to Admission medications   Medication Sig Start Date End Date Taking? Authorizing Provider  albuterol (PROVENTIL HFA;VENTOLIN HFA) 108 (90 Base) MCG/ACT inhaler Inhale 1-2 puffs into the lungs as needed for wheezing.  02/10/18  Yes [provider]  aspirin 325 MG tablet Take 325 mg by mouth daily.   Yes [provider]  azithromycin (ZITHROMAX) 500 MG tablet Use as directed one (1) hour prior to dental procedures.   Yes [provider]  CALCIUM CITRATE PO Take 1 tablet by mouth daily.   Yes [provider]  carvedilol (COREG) 6.25 MG tablet TAKE 1 TABLET TWICE DAILY WITH MEALS Patient taking differently: Take 6.25 mg by mouth 2 (two) times daily with a meal.  08/01/18  Yes Richardson Dopp T, PA-C  Cholecalciferol 2000 units TABS Take 1 tablet (2,000 Units total) by mouth daily. 02/17/17  Yes Janith Lima, MD  fluticasone (FLONASE) 50 MCG/ACT nasal spray PLACE 1 SPRAY INTO BOTH NOSTRILS DAILY. 06/28/18  Yes Janith Lima, MD  furosemide (LASIX) 20 MG tablet Take 1 tablet by mouth daily as needed for edema or weight gain Take 10 mEq potassium when you take lasix 01/12/18  Yes End, Harrell Gave, MD  loratadine (CLARITIN) 10 MG tablet Take 10 mg by mouth daily. Alternates every 3 months with Zyrtec.   Yes [provider]  meclizine (ANTIVERT) 12.5 MG tablet Take 1 tablet (12.5 mg total) by mouth 3 (three) times daily as needed for dizziness. 04/04/18  Yes Janith Lima, MD  omeprazole (PRILOSEC) 20 MG capsule Take 1 capsule (20 mg total) by mouth daily. 01/06/18  Yes End, Harrell Gave, MD  potassium chloride (K-DUR,KLOR-CON) 10 MEQ tablet Take 1  tablet by mouth on the days you take lasix (furosemide) 01/12/18  Yes End, Harrell Gave, MD  simvastatin (ZOCOR) 40 MG tablet TAKE 1 TABLET AT BEDTIME Patient taking differently: Take 40 mg by mouth at bedtime.  07/14/18  Yes Weaver, Scott T, PA-C  cetirizine (ZYRTEC) 10 MG tablet Take 10 mg by mouth daily. Alternates every 3 months with Claritin. Rotates every 3 months    [provider]    Family History Family History  Problem Relation Age of Onset   Hypertension Mother    Breast cancer Mother    Hypothyroidism Mother    Diabetes type II Mother  Heart disease Mother    Heart attack Father        CABG @ 69   CAD Father    Breast cancer Sister        x 2   Colon cancer Maternal Aunt    Breast cancer Maternal Aunt    Colon cancer Paternal Uncle    Pancreatic cancer Other        PGA   Breast cancer Paternal Grandmother     Social History Social History   Tobacco Use   Smoking status: Former Smoker    Packs/day: 1.00    Years: 40.00    Pack years: 40.00    Types: Cigarettes    Quit date: 02/03/2000    Years since quitting: 18.6   Smokeless tobacco: Former Systems developer    Types: Chew   Tobacco comment: discussed LDCT   Substance Use Topics   Alcohol use: No    Alcohol/week: 0.0 standard drinks    Frequency: Never   Drug use: No     Allergies   Amoxicillin, Crestor [rosuvastatin], Lipitor [atorvastatin], Neomycin, Sulfamethoxazole-trimethoprim, Tape, Neosporin [neomycin-bacitracin zn-polymyx], and Polysporin [bacitracin-polymyxin b]   Review of Systems Review of Systems  Constitutional: Negative for chills and fever.  HENT: Negative for congestion.   Eyes: Negative for visual disturbance.  Respiratory: Negative for cough and shortness of breath.   Cardiovascular: Positive for chest pain.  Gastrointestinal: Positive for abdominal pain. Negative for constipation, diarrhea and vomiting.  Genitourinary: Negative for difficulty urinating.    Musculoskeletal: Negative for myalgias.  Skin: Negative for rash.  Neurological: Negative for headaches.     Physical Exam Updated Vital Signs BP (!) 191/56 (BP Location: Right Arm)    Pulse (!) 59    Temp 98.1 F (36.7 C) (Oral)    Resp 18    Ht 5' 2.5" (1.588 m)    Wt 79.8 kg    SpO2 97%    BMI 31.68 kg/m   Physical Exam Vitals signs and nursing note reviewed.  Constitutional:      Appearance: She is not ill-appearing.     Comments: Sitting comfortably upright in bed  HENT:     Head: Normocephalic and atraumatic.  Eyes:     Conjunctiva/sclera: Conjunctivae normal.  Neck:     Musculoskeletal: Neck supple.  Cardiovascular:     Rate and Rhythm: Normal rate and regular rhythm.     Heart sounds: Normal heart sounds.  Pulmonary:     Effort: Pulmonary effort is normal.     Breath sounds: Normal breath sounds. No wheezing, rhonchi or rales.  Abdominal:     Palpations: Abdomen is soft.     Tenderness: There is no abdominal tenderness. There is no guarding or rebound.  Skin:    General: Skin is warm and dry.  Neurological:     Mental Status: She is alert.      ED Treatments / Results  Labs (all labs ordered are listed, but only abnormal results are displayed) Labs Reviewed  COMPREHENSIVE METABOLIC PANEL - Abnormal; Notable for the following components:      Result Value   Glucose, Bld 121 (*)    All other components within normal limits  URINALYSIS, ROUTINE W REFLEX MICROSCOPIC - Abnormal; Notable for the following components:   Color, Urine STRAW (*)    Leukocytes,Ua SMALL (*)    All other components within normal limits  URINE CULTURE  LIPASE, BLOOD  CBC  TROPONIN I (HIGH SENSITIVITY)  TROPONIN I (HIGH SENSITIVITY)  EKG EKG Interpretation  Date/Time:  Monday September 12 2018 14:58:39 EDT Ventricular Rate:  54 PR Interval:    QRS Duration: 155 QT Interval:  492 QTC Calculation: 467 R Axis:   64 Text Interpretation:  Sinus arrhythmia Left bundle branch  block No significant change since last tracing Abnormal ECG Confirmed by Carmin Muskrat [phone]) on 09/12/2018 3:06:19 PM   Radiology Dg Chest 2 View  Result Date: 09/12/2018 CLINICAL DATA:  Chest pain EXAM: CHEST - 2 VIEW COMPARISON:  10/29/2011 FINDINGS: Aortic valve replacement similar in appearance. Heart size normal. Negative for heart failure. Lungs are clear without infiltrate or effusion. Atherosclerotic thoracic aorta. IMPRESSION: No active cardiopulmonary disease. Electronically Signed   By: Franchot Gallo M.D.   On: 09/12/2018 16:00    Procedures Procedures (including critical care time)  Medications Ordered in ED Medications  sodium chloride flush (NS) 0.9 % injection 3 mL (3 mLs Intravenous Not Given 09/12/18 1458)  alum & mag hydroxide-simeth (MAALOX/MYLANTA) 200-200-20 MG/5ML suspension 30 mL (30 mLs Oral Given 09/12/18 1718)    And  lidocaine (XYLOCAINE) 2 % viscous mouth solution 15 mL (15 mLs Oral Given 09/12/18 1718)     Initial Impression / Assessment and Plan / ED Course  I have reviewed the triage vital signs and the nursing notes.  Pertinent labs & imaging results that were available during my care of the patient were reviewed by me and considered in my medical decision making (see chart for details).  73 year old female presents to the ED with complaints of epigastric/underneath left breast pain that began this morning. Hx of same about 1 week ago. Pt very poor historian and unable to give much details. She states she feels as though there is a "lump" in her chest that she needs to get rid of. She also complains of a lump in her throat that has been there for "awhile." Able to eat and drink without difficulty. Not drooling. She appears to be in NAD, sitting up comfortably in bed. Satting 97%+ on RA. Non tachy or tachypneic. Hemodynamically stable; mildly hypertensive but per pt has not taken her antihypertensives this AM. No nausea, vomiting, SOB, diaphoresis. She is  nontender on exam throughout. Doubt ACS but will obtain labwork today. Doubt dissection. EKG unchanged from previous. Initial trop 3, will obtain repeat. CXR clear. No electrolyte abnormalities. Creatinine within normal limits. No leukocytosis. Hgb stable. Awaiting repeat trop and U/A at this time.   Pt concerned that it could be her GERD. She used to be on Zantac and reports she had to stop once it was recalled. She is currently on Prilosec. Did not take her medicine this AM. Will provide GI cocktail in the ED today.   Repeat trop 4. U/A with small leuks; sent for culture. Pt reports pain has somewhat improved with GI cocktail. It appears that she had an endoscopy in 2017 and was positive for H pylori. Pt reports taking the triple therapy as prescribed which improved her pain. She states that she was told by GI Dr. Hilarie Fredrickson that she may need to increase her Prilosec to twice a day instead of once back in March or April but she did not have enough/her prescription did not change and she never increased because she did not want to run out. Suggest following up with Dr. Hilarie Fredrickson in the AM. Strict return precautions discussed. Pt is in agreement with plan and stable for discharge home.    Clinical Course as of Sep 12 1823  Mon Sep 12, 2018  1557 Troponin I (High Sensitivity): 4 [MV]    Clinical Course User Index [MV] Eustaquio Maize, [place]         Final Clinical Impressions(s) / ED Diagnoses   Final diagnoses:  Epigastric pain  Chest pain, unspecified type    ED Discharge Orders    None       Eustaquio Maize, PA-C 09/12/18 2100    Carmin Muskrat, MD 09/15/18 [phone]

## 2018-09-13 LAB — URINE CULTURE

## 2018-09-21 ENCOUNTER — Other Ambulatory Visit: Payer: Self-pay | Admitting: Physician Assistant

## 2018-09-21 DIAGNOSIS — R600 Localized edema: Secondary | ICD-10-CM

## 2018-09-21 DIAGNOSIS — Z952 Presence of prosthetic heart valve: Secondary | ICD-10-CM

## 2018-09-23 ENCOUNTER — Other Ambulatory Visit: Payer: Self-pay | Admitting: Internal Medicine

## 2018-09-26 NOTE — Telephone Encounter (Signed)
Please review for refill. Thank you! 

## 2018-09-27 NOTE — Telephone Encounter (Signed)
No.  This med should be filled by primary care. Thanks, Richardson Dopp, PA-C    09/27/2018 5:48 PM

## 2018-09-29 ENCOUNTER — Ambulatory Visit (INDEPENDENT_AMBULATORY_CARE_PROVIDER_SITE_OTHER): Payer: Medicare HMO | Admitting: Gastroenterology

## 2018-09-29 ENCOUNTER — Encounter: Payer: Self-pay | Admitting: Gastroenterology

## 2018-09-29 VITALS — BP 124/64 | HR 66 | Temp 97.4°F | Ht 62.5 in | Wt 178.0 lb

## 2018-09-29 DIAGNOSIS — K2100 Gastro-esophageal reflux disease with esophagitis, without bleeding: Secondary | ICD-10-CM

## 2018-09-29 DIAGNOSIS — R1013 Epigastric pain: Secondary | ICD-10-CM

## 2018-09-29 DIAGNOSIS — R1011 Right upper quadrant pain: Secondary | ICD-10-CM

## 2018-09-29 DIAGNOSIS — R140 Abdominal distension (gaseous): Secondary | ICD-10-CM | POA: Diagnosis not present

## 2018-09-29 MED ORDER — OMEPRAZOLE 40 MG PO CPDR: 40.0000 mg | DELAYED_RELEASE_CAPSULE | Freq: Every day | ORAL | 5 refills | Status: DC

## 2018-09-29 NOTE — Patient Instructions (Addendum)
.  You have been scheduled for a CT scan of the abdomen and pelvis at [facility] ([address]---this is in the same building as Press photographer).   You are scheduled on 10/12/2018 at 9:15 am. You should arrive 15 minutes prior to your appointment time for registration. Please follow the written instructions below on the day of your exam:  WARNING: IF YOU ARE ALLERGIC TO IODINE/X-RAY DYE, PLEASE NOTIFY RADIOLOGY IMMEDIATELY AT [phone]! YOU WILL BE GIVEN A 13 HOUR PREMEDICATION PREP.  1) Do not eat or drink anything after 5:15 am (4 hours prior to your test) 2) You have been given 2 bottles of oral contrast to drink. The solution may taste better if refrigerated, but do NOT add ice or any other liquid to this solution. Shake well before drinking.    Drink 1 bottle of contrast @ 7:15 am (2 hours prior to your exam)  Drink 1 bottle of contrast @ 8:15 am (1 hour prior to your exam)  You may take any medications as prescribed with a small amount of water, if necessary. If you take any of the following medications: METFORMIN, GLUCOPHAGE, GLUCOVANCE, AVANDAMET, RIOMET, FORTAMET, [place] MET, JANUMET, GLUMETZA or METAGLIP, you MAY be asked to HOLD this medication 48 hours AFTER the exam.  The purpose of you drinking the oral contrast is to aid in the visualization of your intestinal tract. The contrast solution may cause some diarrhea. Depending on your individual set of symptoms, you may also receive an intravenous injection of x-ray contrast/dye. Plan on being at [facility] for 30 minutes or longer, depending on the type of exam you are having performed.  This test typically takes 30-45 minutes to complete.  If you have any questions regarding your exam or if you need to reschedule, you may call the CT department at [phone] between the hours of 8:00 am and 5:00 pm, Monday-Friday . We have sent the following medications to your pharmacy for you to pick up at your  convenience: Omeprazole 36m 1 tablet daily with 5 refills  Thank you for choosing me and [facility]  JAlonza Bogus PA-c  ________________________________________________________________________

## 2018-10-06 ENCOUNTER — Encounter: Payer: Self-pay | Admitting: Gastroenterology

## 2018-10-06 DIAGNOSIS — R1011 Right upper quadrant pain: Secondary | ICD-10-CM | POA: Insufficient documentation

## 2018-10-06 DIAGNOSIS — R140 Abdominal distension (gaseous): Secondary | ICD-10-CM | POA: Insufficient documentation

## 2018-10-06 DIAGNOSIS — R1013 Epigastric pain: Secondary | ICD-10-CM | POA: Insufficient documentation

## 2018-10-06 NOTE — Progress Notes (Signed)
10/06/2018 Heather Crawford [id number] 06-04-1945   HISTORY OF PRESENT ILLNESS:  This is a 73 year old female who is a patient of Dr. Vena Rua, last seen here in 2017.  She is here today with complaints of epigastric abdominal pain, RUQ abdominal pain, generalized abdominal bloating, and GERD.  Was in the ED on 8/10 due to these symptoms and also chest pain.  Says that she has been nauseated as well.  Pain goes through to her back.  Thought she was having a heart attack.  CBC, CMP, lipase, troponins, chest x-ray all unremarkable.  Had some improvement with GI cocktail.  She is on omeprazole 20 mg daily and was told to increase it to twice daily earlier this year but prescription was apparently not changed and she did not want it to run out so she never increased the medication.  EGD 04/2015 showed the following: - Widely patent Schatzki ring. - Small hiatal hernia. - Erythematous mucosa in the prepyloric region of the stomach. Biopsied.  Was positive for Hpylori and was treated. - Normal examined duodenum.  Colonoscopy 04/2015 showed the following: - Perianal skin tags found on perianal exam. - One 6 mm polyp in the rectum, removed with a hot snare. Resected and retrieved.  Tubular adenoma. - Mild diverticulosis in the ascending colon and in the left colon. - Internal hemorrhoids.    Past Medical History:  Diagnosis Date  . Allergic rhinitis, cause unspecified   . Aortic stenosis    a.  echo 1/12 w/mildly dilated LV, EF 45-50% w/paradoxical septal motion consistent w/ LBBB, moderate aortic stenosis w/mean gradient 27 mmHg, trivial AI, mild to moderate MR w/calcified mitral valve;   b. s/p AVR with 21 mm Magna Ease valve  . Aortic valve replaced 2012  . Arthritis    joint pain  . CAD (coronary artery disease)    a. s/p CABG 4/12: RIMA-RCA  . CAD, multiple vessel    sees Dr Aundra Dubin every 3-4 months  . Cancer ([place])    skin sarcomas removed  . CHF (congestive heart failure) (H[facility])     chronic diastolic CHF  . Chronic back pain    H/O BACK SURGERY  . Coronary artery disease    adenosine myoview 2/12 w/EF 56%, ISCHEMIA W/SCAR IN THE MID TO APICAL ANTERIO WALL, SEPTAL WALL, AND APEX. LHC 3/12 W/EF 55%, 40% OSTIAL RCA, 60% MID RCA, 95% DISTAL RC A, INTERVENTION  ATTEMPTED BUT UNABLE TO ADEQUADATELY SEAT  CATHETER.  . Diverticulitis of colon (without mention of hemorrhage)(562.11)   . Diverticulosis   . Esophageal reflux   . Glucose intolerance (impaired glucose tolerance)    A1C 6.3 (4/12)  . H/O hiatal hernia   . H/O vertigo    with fluid  in ears  . Heart murmur   . Hemorrhoids   . Hemorrhoids   . History of rheumatic fever   . Hx of hysterectomy   . Hx: UTI (urinary tract infection)   . Hyperlipidemia   . LBBB (left bundle branch block)   . Myocardial infarction ([place]) 2002  . Pneumonia   . PONV (postoperative nausea and vomiting)   . 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 Snoring   . Urinary frequency    Past Surgical History:  Procedure Laterality Date  . ABDOMINAL HYSTERECTOMY    . APPENDECTOMY  1973  . BACK SURGERY     Lumbar Lam and discectomy x 2  . BREAST BIOPSY Right  10/2011  . BREAST LUMPECTOMY Right   . CARDIAC CATHETERIZATION  2012  . CARDIAC VALVE REPLACEMENT  2012   Aortic  . CORONARY ARTERY BYPASS GRAFT  2012  . DILATION AND CURETTAGE OF UTERUS    . ENDOMETRIAL ABLATION    . LAPAROSCOPIC LYSIS INTESTINAL ADHESIONS    . LUMBAR [place]  . SKIN SURGERY     skin sarcomas removed  . TONSILLECTOMY     1955  . TONSILLECTOMY      reports that she quit smoking about 18 years ago. Her smoking use included cigarettes. She has a 40.00 pack-year smoking history. She has quit using smokeless tobacco.  Her smokeless tobacco use included chew. She reports that she does not drink alcohol or use drugs. family history includes Breast cancer in her maternal aunt, mother, paternal grandmother, and sister; CAD in her father;  Colon cancer in her maternal aunt and paternal uncle; Diabetes type II in her mother; Heart attack in her father; Heart disease in her mother; Hypertension in her mother; Hypothyroidism in her mother; Pancreatic cancer in an other family member. Allergies  Allergen Reactions  . Amoxicillin Itching    Did it involve swelling of the face/tongue/throat, SOB, or low BP? No Did it involve sudden or severe rash/hives, skin peeling, or any reaction on the inside of your mouth or nose? No Did you need to seek medical attention at a hospital or doctor's office? No When did it last happen?per pt long time ago If all above answers are "NO", may proceed with cephalosporin use.   . Crestor [Rosuvastatin] Other (See Comments)    myalgia  . Lipitor [Atorvastatin] Other (See Comments)    myalgia  . Neomycin Itching    Topical neomycin   . Sulfamethoxazole-Trimethoprim Hives  . Tape Hives    Breakouts from the adhesive in paper tape and patches  . Neosporin [Neomycin-Bacitracin Zn-Polymyx] Other (See Comments)    Caused blistered  . Polysporin [Bacitracin-Polymyxin B] Rash      Outpatient Encounter Medications as of 09/29/2018  Medication Sig  . albuterol (PROVENTIL HFA;VENTOLIN HFA) 108 (90 Base) MCG/ACT inhaler Inhale 1-2 puffs into the lungs as needed for wheezing.   Marland Kitchen aspirin 325 MG tablet Take 325 mg by mouth daily.  Marland Kitchen azithromycin (ZITHROMAX) 500 MG tablet Use as directed one (1) hour prior to dental procedures.  Marland Kitchen CALCIUM CITRATE PO Take 1 tablet by mouth daily.  . carvedilol (COREG) 6.25 MG tablet Take 1 tablet (6.25 mg total) by mouth 2 (two) times daily with a meal.  . cetirizine (ZYRTEC) 10 MG tablet Take 10 mg by mouth daily. Alternates every 3 months with Claritin. Rotates every 3 months  . Cholecalciferol 2000 units TABS Take 1 tablet (2,000 Units total) by mouth daily.  . fluticasone (FLONASE) 50 MCG/ACT nasal spray PLACE 1 SPRAY INTO BOTH NOSTRILS DAILY.  . furosemide (LASIX)  20 MG tablet Take 1 tablet by mouth daily as needed for edema or weight gain Take 10 mEq potassium when you take lasix  . loratadine (CLARITIN) 10 MG tablet Take 10 mg by mouth daily. Alternates every 3 months with Zyrtec.  . meclizine (ANTIVERT) 12.5 MG tablet Take 1 tablet (12.5 mg total) by mouth 3 (three) times daily as needed for dizziness.  . potassium chloride (K-DUR,KLOR-CON) 10 MEQ tablet Take 1 tablet by mouth on the days you take lasix (furosemide)  . simvastatin (ZOCOR) 40 MG tablet TAKE 1 TABLET  AT BEDTIME (Patient taking differently: Take 40 mg by mouth at bedtime. )  . [DISCONTINUED] omeprazole (PRILOSEC) 20 MG capsule TAKE 1 CAPSULE EVERY DAY  . omeprazole (PRILOSEC) 40 MG capsule Take 1 capsule (40 mg total) by mouth daily.   No facility-administered encounter medications on file as of 09/29/2018.      REVIEW OF SYSTEMS  : All other systems reviewed and negative except where noted in the History of Present Illness.   PHYSICAL EXAM: BP 124/64   Pulse 66   Temp (!) 97.4 F (36.3 C)   Ht 5' 2.5" (1.588 m)   Wt 178 lb (80.7 kg)   BMI 32.04 kg/m  General: Well developed white female in no acute distress Head: Normocephalic and atraumatic Eyes:  Sclerae anicteric, conjunctiva pink. Ears: Normal auditory acuity Lungs: Clear throughout to auscultation; no increased WOB. Heart: Regular rate and rhythm; no M/R/G. Abdomen: Soft, non-distended.  BS present.  Mild epigastric and RUQ TTP. Musculoskeletal: Symmetrical with no gross deformities  Skin: No lesions on visible extremities Extremities: No edema  Neurological: Alert oriented x 4, grossly non-focal Psychological:  Alert and cooperative. Normal mood and affect  ASSESSMENT AND PLAN: *73 year old female with complaints of epigastric abdominal pain, RUQ abdominal pain, generalized abdominal bloating, and GERD.  Lab studies unremarkable at recent ED visit.  Will check CT scan of the abdomen and pelvis with contrast.  I am  changing her PPI to 40 mg daily and encouraged her to take it every day.  If CT scan unremarkable then may need PPI 40 mg BID and possible repeat EGD.   CC:  Janith Lima, MD

## 2018-10-11 ENCOUNTER — Encounter: Payer: Self-pay | Admitting: Cardiology

## 2018-10-11 ENCOUNTER — Ambulatory Visit (INDEPENDENT_AMBULATORY_CARE_PROVIDER_SITE_OTHER): Payer: Medicare HMO | Admitting: Cardiology

## 2018-10-11 ENCOUNTER — Other Ambulatory Visit: Payer: Self-pay

## 2018-10-11 VITALS — BP 124/68 | HR 67 | Ht 62.5 in | Wt 175.8 lb

## 2018-10-11 DIAGNOSIS — I2510 Atherosclerotic heart disease of native coronary artery without angina pectoris: Secondary | ICD-10-CM | POA: Diagnosis not present

## 2018-10-11 DIAGNOSIS — E785 Hyperlipidemia, unspecified: Secondary | ICD-10-CM | POA: Diagnosis not present

## 2018-10-11 DIAGNOSIS — I5032 Chronic diastolic (congestive) heart failure: Secondary | ICD-10-CM

## 2018-10-11 DIAGNOSIS — Z952 Presence of prosthetic heart valve: Secondary | ICD-10-CM

## 2018-10-11 DIAGNOSIS — I739 Peripheral vascular disease, unspecified: Secondary | ICD-10-CM

## 2018-10-11 DIAGNOSIS — I10 Essential (primary) hypertension: Secondary | ICD-10-CM | POA: Diagnosis not present

## 2018-10-11 DIAGNOSIS — I779 Disorder of arteries and arterioles, unspecified: Secondary | ICD-10-CM

## 2018-10-11 DIAGNOSIS — I38 Endocarditis, valve unspecified: Secondary | ICD-10-CM | POA: Diagnosis not present

## 2018-10-11 NOTE — Patient Instructions (Addendum)
Medication Instructions:  Your physician recommends that you continue on your current medications as directed. Please refer to the Current Medication list given to you today.  If you need a refill on your cardiac medications before your next appointment, please call your pharmacy.   Lab work: None  If you have labs (blood work) drawn today and your tests are completely normal, you will receive your results only by: Marland Kitchen MyChart Message (if you have MyChart) OR . A paper copy in the mail If you have any lab test that is abnormal or we need to change your treatment, we will call you to review the results.  Testing/Procedures: None   Follow-Up: At [facility], you and your health needs are our priority.  As part of our continuing mission to provide you with exceptional heart care, we have created designated Provider Care Teams.  These Care Teams include your primary Cardiologist (physician) and Advanced Practice Providers (APPs -  Physician Assistants and Nurse Practitioners) who all work together to provide you with the care you need, when you need it. You will need a follow up appointment in:  6 months.  Please call our office 2 months in advance to schedule this appointment.  You may see Sherren Mocha, MD or one of the following Advanced Practice Providers on your designated Care Team: Richardson Dopp, PA-C [place], [place] . Daune Perch, NP  Any Other Special Instructions Will Be Listed Below (If Applicable).   Lifestyle Modifications to Prevent and Treat Heart Disease -Recommend heart healthy/Mediterranean diet, with whole grains, fruits, vegetables, fish, lean meats, nuts, olive oil and avocado oil.  -Limit salt intake to less than 1500 mg per day.  -Recommend moderate walking, starting slowly with a few minutes and working up to 3-5 times/week -Recommend avoidance of tobacco products. Avoid excess alcohol. -Keep blood pressure well controlled, ideally less than 130/80.

## 2018-10-11 NOTE — Progress Notes (Signed)
Cardiology Office Note:    Date:  10/11/2018   ID:  Heather, Crawford 09/27/1945, MRN [id number]  PCP:  Janith Lima, MD  Cardiologist:  Sherren Mocha, MD  Referring MD: Janith Lima, MD   Chief Complaint  Patient presents with  . Follow-up    S/P AVR  . Coronary Artery Disease    History of Present Illness:    Heather Crawford is a 73 y.o. 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  She was previously followed by Dr. Aundra Dubin and then has been followed by Dr. Saunders Revel and Richardson Dopp, PA.  She had complaints of chest pain in 2019 felt to be possibly related to an episode of pneumonia.  Heather Crawford is here today for 11-month follow-up. She had some chest pain last month and says she had very high BP.  She was seen in the ED.  She says her symptoms were related to acid reflux as she has a longstanding history of reflux and is followed by gastroenterology. Symptoms improved with GI cocktail. She eats a lot of tomatoes. She has a CT scan scheduled for tomorrow to evaluate her gall bladder.   She is usually fairly active. She has a large garden and just finished putting up >100 cans of green beans and multiple other vegetables. She does not have any chest discomfort or shortness of breath with working out in the garden.   She has had dizziness for several years. She says that occ she feels lightheadedness with tingling of her arms/hands. She says that her husband says that she passed out one time in the bed for about 15 seconds. Her BP is normal when she checks it. Sometimes her heart rate is down in the 50's. Sometimes she has bad inner ear trouble with nausea and loss of balance, sweating. She has been evaluated with the episodes. She says that  meclizine helps and her episodes are less often and less severe.  She continues to take aspirin 325 mg daily despite being advised by Dr. Saunders Revel and Dennard Nip to reduce it to 81 mg daily.  She says that she was told by Dr. Roxan Hockey at the time of her valve replacement that she was to take aspirin 325 mg for the rest of her life, "until they close the lid on my coffin".  I advised her on the updated recommendations with a decrease in bleeding and stomach irritation on the lower dose of aspirin, but she says that she is afraid to reduce her dose.   Past Medical History:  Diagnosis Date  . Allergic rhinitis, cause unspecified   . Aortic stenosis    a.  echo 1/12 w/mildly dilated LV, EF 45-50% w/paradoxical septal motion consistent w/ LBBB, moderate aortic stenosis w/mean gradient 27 mmHg, trivial AI, mild to moderate MR w/calcified mitral valve;   b. s/p AVR with 21 mm Magna Ease valve  . Aortic valve replaced 2012  . Arthritis    joint pain  . CAD (coronary artery disease)    a. s/p CABG 4/12: RIMA-RCA  . CAD, multiple vessel    sees Dr Aundra Dubin every 3-4 months  . Cancer ([facility])    skin sarcomas removed  . CHF (congestive heart failure) (HCC)    chronic diastolic CHF  . Chronic  back pain    H/O BACK SURGERY  . Coronary artery disease    adenosine myoview 2/12 w/EF 56%, ISCHEMIA W/SCAR IN THE MID TO APICAL ANTERIO WALL, SEPTAL WALL, AND APEX. LHC 3/12 W/EF 55%, 40% OSTIAL RCA, 60% MID RCA, 95% DISTAL RC A, INTERVENTION  ATTEMPTED BUT UNABLE TO ADEQUADATELY SEAT  CATHETER.  . Diverticulitis of colon (without mention of hemorrhage)(562.11)   . Diverticulosis   . Esophageal reflux   . Glucose intolerance (impaired glucose tolerance)    A1C 6.3 (4/12)  . H/O hiatal hernia   . H/O vertigo    with fluid  in ears  . Heart murmur   . Hemorrhoids   . Hemorrhoids   . History of rheumatic fever   . Hx of hysterectomy   . Hx: UTI (urinary tract infection)   . Hyperlipidemia   . LBBB (left  bundle branch block)   . Myocardial infarction ([place]) 2002  . Pneumonia   . PONV (postoperative nausea and vomiting)   . 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 Snoring   . Urinary frequency     Past Surgical History:  Procedure Laterality Date  . ABDOMINAL HYSTERECTOMY    . APPENDECTOMY  1973  . BACK SURGERY     Lumbar Lam and discectomy x 2  . BREAST BIOPSY Right 10/2011  . BREAST LUMPECTOMY Right   . CARDIAC CATHETERIZATION  2012  . CARDIAC VALVE REPLACEMENT  2012   Aortic  . CORONARY ARTERY BYPASS GRAFT  2012  . DILATION AND CURETTAGE OF UTERUS    . ENDOMETRIAL ABLATION    . LAPAROSCOPIC LYSIS INTESTINAL ADHESIONS    . LUMBAR [place]  . SKIN SURGERY     skin sarcomas removed  . TONSILLECTOMY     1955  . TONSILLECTOMY      Current Medications: Current Meds  Medication Sig  . albuterol (PROVENTIL HFA;VENTOLIN HFA) 108 (90 Base) MCG/ACT inhaler Inhale 1-2 puffs into the lungs as needed for wheezing.   Marland Kitchen aspirin 325 MG tablet Take 325 mg by mouth daily.  Marland Kitchen azithromycin (ZITHROMAX) 500 MG tablet Use as directed one (1) hour prior to dental procedures.  Marland Kitchen CALCIUM CITRATE PO Take 1 tablet by mouth daily.  . carvedilol (COREG) 6.25 MG tablet Take 1 tablet (6.25 mg total) by mouth 2 (two) times daily with a meal.  . cetirizine (ZYRTEC) 10 MG tablet Take 10 mg by mouth daily. Alternates every 3 months with Claritin. Rotates every 3 months  . Cholecalciferol 2000 units TABS Take 1 tablet (2,000 Units total) by mouth daily.  . fluticasone (FLONASE) 50 MCG/ACT nasal spray PLACE 1 SPRAY INTO BOTH NOSTRILS DAILY.  . furosemide (LASIX) 20 MG tablet Take 1 tablet by mouth daily as needed for edema or weight gain Take 10 mEq potassium when you take lasix  . loratadine (CLARITIN) 10 MG tablet Take 10 mg by mouth daily. Alternates every 3 months with Zyrtec.  . meclizine (ANTIVERT) 12.5 MG tablet Take 1 tablet (12.5 mg total) by mouth 3 (three) times daily as  needed for dizziness.  Marland Kitchen omeprazole (PRILOSEC) 40 MG capsule Take 1 capsule (40 mg total) by mouth daily.  . potassium chloride (K-DUR,KLOR-CON) 10 MEQ tablet Take 1 tablet by mouth on the days you take lasix (furosemide)  . simvastatin (ZOCOR) 40 MG tablet TAKE 1 TABLET AT BEDTIME     Allergies:   Amoxicillin, Crestor [rosuvastatin], Lipitor [  atorvastatin], Neomycin, Sulfamethoxazole-trimethoprim, Tape, Neosporin [neomycin-bacitracin zn-polymyx], and Polysporin [bacitracin-polymyxin b]   Social History   Socioeconomic History  . Marital status: Married    Spouse name: Not on file  . Number of children: 2  . Years of education: Not on file  . Highest education level: Not on file  Occupational History  . 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 Financial resource strain: Not hard at all  . Food insecurity    Worry: Never true    Inability: Never true  . Transportation needs    Medical: No    Non-medical: No  Tobacco Use  . Smoking status: Former Smoker    Packs/day: 1.00    Years: 40.00    Pack years: 40.00    Types: Cigarettes    Quit date: 02/03/2000    Years since quitting: 18.6  . Smokeless tobacco: Former Systems developer    Types: Chew  . Tobacco comment: discussed LDCT   Substance and Sexual Activity  . Alcohol use: No    Alcohol/week: 0.0 standard drinks    Frequency: Never  . Drug use: No  . Sexual activity: Yes  Lifestyle  . Physical activity    Days per week: 0 days    Minutes per session: 0 min  . Stress: Not at all  Relationships  . Social connections    Talks on phone: More than three times a week    Gets together: More than three times a week    Attends religious service: More than 4 times per year    Active member of club or organization: Yes    Attends meetings of clubs or organizations: More than 4 times per year    Relationship status: Married  Other Topics Concern  . Not  on file  Social History Narrative   MARRIED   1 SON 1 DAUGHTER 2 GRANDCHILDREN   Music therapist, NOW FULL TIME CAREGIVER   LIVES IN [place], GREW UP IN [name].     Family History: The patient's family history includes Breast cancer in her maternal aunt, mother, paternal grandmother, and sister; CAD in her father; Colon cancer in her maternal aunt and paternal uncle; Diabetes type II in her mother; Heart attack in her father; Heart disease in her mother; Hypertension in her mother; Hypothyroidism in her mother; Pancreatic cancer in an other family member. There is no history of Stomach cancer, Throat cancer, or Esophageal cancer. ROS:   Please see the history of present illness.     All other systems reviewed and are negative.  EKGs/Labs/Other Studies Reviewed:    The following studies were reviewed today:  Echocardiogram 10/14/2016 Study Conclusions  - Left ventricle: The cavity size was normal. Wall thickness was   increased in a pattern of mild LVH. Systolic function was normal.   The estimated ejection fraction was in the range of 60% to 65%.   The study is not technically sufficient to allow evaluation of LV   diastolic function. - Aortic valve: Normal appearing bioprosthetic AV with no peri   valvular regurgitation stable mean gradient since 2015 - Mitral valve: Severely calcified annulus. There was mild   regurgitation. - Left atrium: The atrium was moderately dilated. - Atrial septum: No defect or patent foramen ovale was identified.  EKG:  EKG is not ordered today.    Recent Labs: 06/08/2018: TSH 4.74 09/12/2018: ALT 23; BUN 12; Creatinine, Ser  0.65; Hemoglobin 13.2; Platelets 211; Potassium 4.0; Sodium 139   Recent Lipid Panel    Component Value Date/Time   CHOL 150 01/06/2018 1113   TRIG 237 (H) 01/06/2018 1113   HDL 42 01/06/2018 1113   CHOLHDL 3.6 01/06/2018 1113   CHOLHDL 4 02/17/2017 1353   VLDL 74.4 (H) 02/17/2017 1353   LDLCALC 61 01/06/2018 1113    LDLDIRECT 79 01/06/2018 1113   LDLDIRECT 101.0 02/17/2017 1353    Physical Exam:    VS:  BP 124/68   Pulse 67   Ht 5' 2.5" (1.588 m)   Wt 175 lb 12.8 oz (79.7 kg)   SpO2 97%   BMI 31.64 kg/m     Wt Readings from Last 3 Encounters:  10/11/18 175 lb 12.8 oz (79.7 kg)  09/29/18 178 lb (80.7 kg)  09/12/18 176 lb (79.8 kg)     Physical Exam  Constitutional: She is oriented to person, place, and time. She appears well-developed and well-nourished. No distress.  HENT:  Head: Normocephalic and atraumatic.  Neck: Normal range of motion. Neck supple. No JVD present.  Cardiovascular: Normal rate and regular rhythm.  Murmur heard.  Harsh midsystolic murmur is present with a grade of 2/6 at the upper right sternal border radiating to the neck. Pulmonary/Chest: Effort normal and breath sounds normal. No respiratory distress. She has no wheezes. She has no rales.  Abdominal: Soft. Bowel sounds are normal.  Musculoskeletal: Normal range of motion.        General: No deformity or edema.  Neurological: She is alert and oriented to person, place, and time.  Skin: Skin is warm and dry.  Psychiatric: She has a normal mood and affect. Her behavior is normal. Judgment and thought content normal.  Vitals reviewed.   ASSESSMENT:    1. Chronic diastolic heart failure ([place])   2. Coronary artery disease involving native coronary artery of native heart without angina pectoris   3. Valvular heart disease s/p AVR in 2012   4. S/P AVR (aortic valve replacement)   5. Essential (primary) hypertension   6. Bilateral carotid artery disease, unspecified type ([place])   7. Hyperlipidemia, unspecified hyperlipidemia type    PLAN:    In order of problems listed above:  Chronic diastolic heart failure -Normal EF by echo in 2018 -Patient takes as needed Lasix a couple of times per week for mild lower extremity edema.  She denies any orthopnea or dyspnea on exertion.  CAD with history of 1V CABG 2012 -On  aspirin, statin and beta-blocker -No current anginal symptoms.  She has been having epigastric pain that she feels sure is related to acid reflux as this has been a significant issue in the past.  She is seen by GI and has a plan for CT of the abdomen tomorrow to look for gallbladder disease. -The patient symptoms are nonexertional and often are related to offending foods.  I did discuss that if she were to have exertional discomfort, we could do a stress test to further evaluate.  She agrees and will call if her symptoms do not resolve with treating GI.  Valvular heart disease s/p AVR in 2012, Bovine tissue valve -Normally functioning AVR by echo in 2018.  Mild MR at that time. -Continue dental SBE prophylaxis -Asymptomatic. -Patient continues to take aspirin 325 mg as Dr. Roxan Hockey told her at the time of surgery that she needed to be on this for life.  She does not want or reduce the aspirin dose without  checking with Dr. Roxan Hockey.  I will send a message for his input.  Essential hypertension -Patient reports that she had an episode of high blood pressure last month during a time of significant GI discomfort.  Today her blood pressure is well controlled. -Recent labs 09/12/2018 with normal renal function and potassium.  Bilateral carotid artery disease -Mild bilateral ICA plaque by carotid ultrasound in 2017.  Patient has bilateral carotid bruits, most likely referred from her aortic murmur. -Continues on aspirin, statin  Hyperlipidemia -On simvastatin 40 mg daily -Lipid panel in 01/2018 showed direct LDL 79, calculated LDL 61 -Continue current therapy  Medication Adjustments/Labs and Tests Ordered: Current medicines are reviewed at length with the patient today.  Concerns regarding medicines are outlined above. Labs and tests ordered and medication changes are outlined in the patient instructions below:  Patient Instructions  Medication Instructions:  Your physician recommends that  you continue on your current medications as directed. Please refer to the Current Medication list given to you today.  If you need a refill on your cardiac medications before your next appointment, please call your pharmacy.   Lab work: None  If you have labs (blood work) drawn today and your tests are completely normal, you will receive your results only by: Marland Kitchen MyChart Message (if you have MyChart) OR . A paper copy in the mail If you have any lab test that is abnormal or we need to change your treatment, we will call you to review the results.  Testing/Procedures: None   Follow-Up: At [facility], you and your health needs are our priority.  As part of our continuing mission to provide you with exceptional heart care, we have created designated Provider Care Teams.  These Care Teams include your primary Cardiologist (physician) and Advanced Practice Providers (APPs -  Physician Assistants and Nurse Practitioners) who all work together to provide you with the care you need, when you need it. You will need a follow up appointment in:  6 months.  Please call our office 2 months in advance to schedule this appointment.  You may see Sherren Mocha, MD or one of the following Advanced Practice Providers on your designated Care Team: Richardson Dopp, PA-C [place], [place] . Daune Perch, NP  Any Other Special Instructions Will Be Listed Below (If Applicable).   Lifestyle Modifications to Prevent and Treat Heart Disease -Recommend heart healthy/Mediterranean diet, with whole grains, fruits, vegetables, fish, lean meats, nuts, olive oil and avocado oil.  -Limit salt intake to less than 1500 mg per day.  -Recommend moderate walking, starting slowly with a few minutes and working up to 3-5 times/week -Recommend avoidance of tobacco products. Avoid excess alcohol. -Keep blood pressure well controlled, ideally less than 130/80.      Signed, Daune Perch, NP  10/11/2018 10:12 AM    [place]

## 2018-10-12 ENCOUNTER — Ambulatory Visit (INDEPENDENT_AMBULATORY_CARE_PROVIDER_SITE_OTHER)
Admission: RE | Admit: 2018-10-12 | Discharge: 2018-10-12 | Disposition: A | Discharge status: Home / Self Care | Payer: Medicare HMO | Source: Ambulatory Visit | Attending: Gastroenterology | Admitting: Gastroenterology

## 2018-10-12 DIAGNOSIS — K2100 Gastro-esophageal reflux disease with esophagitis, without bleeding: Secondary | ICD-10-CM

## 2018-10-12 DIAGNOSIS — R1013 Epigastric pain: Secondary | ICD-10-CM

## 2018-10-12 DIAGNOSIS — R140 Abdominal distension (gaseous): Secondary | ICD-10-CM

## 2018-10-12 DIAGNOSIS — R1011 Right upper quadrant pain: Secondary | ICD-10-CM | POA: Diagnosis not present

## 2018-10-12 DIAGNOSIS — K760 Fatty (change of) liver, not elsewhere classified: Secondary | ICD-10-CM | POA: Diagnosis not present

## 2018-10-12 MED ORDER — IOHEXOL 300 MG/ML  SOLN
100.0000 mL | Freq: Once | INTRAMUSCULAR | Status: AC | PRN
  Administered 2018-10-12: 100 mL via INTRAVENOUS

## 2018-10-18 ENCOUNTER — Telehealth: Payer: Self-pay | Admitting: Gastroenterology

## 2018-10-18 NOTE — Telephone Encounter (Signed)
Pt inquired results of CT.

## 2018-10-18 NOTE — Telephone Encounter (Signed)
Heather Crawford the pt is calling looking for Ct results.  Have you reviewed?

## 2018-11-02 NOTE — Progress Notes (Signed)
Addendum: Reviewed and agree with assessment and management plan. Brooke Payes M, MD  

## 2018-11-10 ENCOUNTER — Other Ambulatory Visit: Payer: Self-pay | Admitting: Internal Medicine

## 2018-11-10 DIAGNOSIS — Z952 Presence of prosthetic heart valve: Secondary | ICD-10-CM

## 2018-11-11 NOTE — Telephone Encounter (Signed)
Please review for refill. Thanks!  

## 2018-11-24 DIAGNOSIS — Z803 Family history of malignant neoplasm of breast: Secondary | ICD-10-CM | POA: Diagnosis not present

## 2018-11-24 DIAGNOSIS — Z1231 Encounter for screening mammogram for malignant neoplasm of breast: Secondary | ICD-10-CM | POA: Diagnosis not present

## 2018-11-24 LAB — HM MAMMOGRAPHY

## 2018-12-02 ENCOUNTER — Encounter: Payer: Self-pay | Admitting: Internal Medicine

## 2018-12-06 DIAGNOSIS — M79644 Pain in right finger(s): Secondary | ICD-10-CM | POA: Diagnosis not present

## 2018-12-06 DIAGNOSIS — M19041 Primary osteoarthritis, right hand: Secondary | ICD-10-CM | POA: Diagnosis not present

## 2018-12-07 ENCOUNTER — Telehealth: Payer: Self-pay | Admitting: *Deleted

## 2018-12-07 NOTE — Telephone Encounter (Signed)
Dr. Burt Knack  It appears that this patient was previously followed by Dr. Saunders Revel, however was transitioned to you after his move to [facility]. She has a right thumb CMC arthroplasty pending medical clearance. Dr. 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 She was last seen by Gae Bon 10/11/2018 and was doing well from a cardiac perspective. Staying active with no exertional angina. Was having difficulty with GERD symptoms. Noted to be on high dose ASA despite recommendations to reduce dose to 81mg .   Can you please make holding recommendations for upcoming procedure and send to the pre-op pool  Thank you Sharee Pimple

## 2018-12-07 NOTE — Telephone Encounter (Signed)
   [facility] Pre-operative Risk Assessment    Request for surgical clearance:  1. What type of surgery is being performed? RIGHT THUMB CMC ARTHROPLASTY   2. When is this surgery scheduled? TBD   3. What type of clearance is required (medical clearance vs. Pharmacy clearance to hold med vs. Both)? MEDICAL  4. Are there any medications that need to be held prior to surgery and how long? ASA    5. Practice name and name of physician performing surgery? [facility]; DR. Jeneen Rinks [place]  6. What is your office phone number [phone]    7.   What is your office fax number [phone]  8.   Anesthesia type (None, local, MAC, general) ? MAC   Julaine Hua 12/07/2018, 2:03 PM  _________________________________________________________________   (provider comments below)

## 2018-12-08 NOTE — Telephone Encounter (Signed)
   Primary Cardiologist: Sherren Mocha, MD  Chart reviewed as part of pre-operative protocol coverage. Given past medical history and time since last visit, based on ACC/AHA guidelines, Heather Crawford would be at acceptable risk for the planned procedure without further cardiovascular testing.   Per Dr. Burt Knack, it will be acceptable to hold ASA therapy for 5 days prior to procedure then resume thereafter when safe per surgical team.   I will route this recommendation to the requesting party via [facility] fax function and remove from pre-op pool.  Please call with questions.  Kathyrn Drown, NP 12/08/2018, 8:26 AM

## 2018-12-08 NOTE — Telephone Encounter (Signed)
Hold ASA x 5 days, resume after surgery. thanks

## 2019-02-06 ENCOUNTER — Other Ambulatory Visit: Payer: Self-pay | Admitting: Internal Medicine

## 2019-02-06 DIAGNOSIS — J309 Allergic rhinitis, unspecified: Secondary | ICD-10-CM

## 2019-03-29 ENCOUNTER — Other Ambulatory Visit: Payer: Self-pay | Admitting: Gastroenterology

## 2019-03-31 ENCOUNTER — Other Ambulatory Visit: Payer: Self-pay | Admitting: Physician Assistant

## 2019-03-31 DIAGNOSIS — Z952 Presence of prosthetic heart valve: Secondary | ICD-10-CM

## 2019-03-31 DIAGNOSIS — R600 Localized edema: Secondary | ICD-10-CM

## 2019-04-10 ENCOUNTER — Ambulatory Visit: Payer: Medicare HMO

## 2019-04-17 ENCOUNTER — Encounter: Payer: Self-pay | Admitting: Cardiovascular Disease

## 2019-04-17 ENCOUNTER — Other Ambulatory Visit: Payer: Self-pay

## 2019-04-17 ENCOUNTER — Ambulatory Visit: Payer: Medicare HMO | Admitting: Cardiovascular Disease

## 2019-04-17 VITALS — BP 160/82 | HR 54 | Ht 62.0 in | Wt 178.8 lb

## 2019-04-17 DIAGNOSIS — I25119 Atherosclerotic heart disease of native coronary artery with unspecified angina pectoris: Secondary | ICD-10-CM

## 2019-04-17 DIAGNOSIS — I060 Rheumatic aortic stenosis: Secondary | ICD-10-CM

## 2019-04-17 NOTE — Progress Notes (Signed)
Cardiology Office Note:    Date:  04/19/2019   ID:  Heather, Crawford Jun 27, 1945, MRN [id number]  PCP:  Janith Lima, MD  Cardiologist:  Sherren Mocha, MD  Electrophysiologist:  None   Referring MD: Janith Lima, MD   Chief Complaint  Patient presents with  . Shortness of Breath    History of Present Illness:    Heather Crawford is a 74 y.o. female with a hx of rheumatic heart disease status post bioprosthetic aortic valve replacement and single-vessel CABG in 2012.  Other issues include chronic diastolic heart failure, carotid artery disease, chronic vertigo, and left bundle branch block.  The patient is here alone today. She admits to jaw discomfort when she exerts herself. She also has exertional dyspnea. On flat ground at normal pace she feels ok. Symptoms occur with stress or a higher level of activity. No orthopnea, PND, or heart palpitations. She states that symptoms occur when she 'gets in a hurry.' She takes a diuretic periodically for leg swelling.   Past Medical History:  Diagnosis Date  . Allergic rhinitis, cause unspecified   . Aortic stenosis    a.  echo 1/12 w/mildly dilated LV, EF 45-50% w/paradoxical septal motion consistent w/ LBBB, moderate aortic stenosis w/mean gradient 27 mmHg, trivial AI, mild to moderate MR w/calcified mitral valve;   b. s/p AVR with 21 mm Magna Ease valve  . Aortic valve replaced 2012  . Arthritis    joint pain  . CAD (coronary artery disease)    a. s/p CABG 4/12: RIMA-RCA  . CAD, multiple vessel    sees Dr Aundra Dubin every 3-4 months  . Cancer ([place])    skin sarcomas removed  . CHF (congestive heart failure) (HCC)    chronic diastolic CHF  . Chronic back pain    H/O BACK SURGERY  . Coronary artery disease    adenosine myoview 2/12 w/EF 56%, ISCHEMIA W/SCAR IN THE MID TO APICAL ANTERIO WALL, SEPTAL WALL, AND APEX. LHC 3/12 W/EF 55%, 40% OSTIAL RCA, 60% MID RCA, 95% DISTAL RC A, INTERVENTION  ATTEMPTED BUT UNABLE TO  ADEQUADATELY SEAT  CATHETER.  . Diverticulitis of colon (without mention of hemorrhage)(562.11)   . Diverticulosis   . Esophageal reflux   . Glucose intolerance (impaired glucose tolerance)    A1C 6.3 (4/12)  . H/O hiatal hernia   . H/O vertigo    with fluid  in ears  . Heart murmur   . Hemorrhoids   . Hemorrhoids   . History of rheumatic fever   . Hx of hysterectomy   . Hx: UTI (urinary tract infection)   . Hyperlipidemia   . LBBB (left bundle branch block)   . Myocardial infarction ([place]) 2002  . Pneumonia   . PONV (postoperative nausea and vomiting)   . 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 Snoring   . Urinary frequency     Past Surgical History:  Procedure Laterality Date  . ABDOMINAL HYSTERECTOMY    . APPENDECTOMY  1973  . BACK SURGERY     Lumbar Lam and discectomy x 2  . BREAST BIOPSY Right 10/2011  . BREAST LUMPECTOMY Right   . CARDIAC CATHETERIZATION  2012  . CARDIAC VALVE REPLACEMENT  2012   Aortic  . CORONARY ARTERY BYPASS GRAFT  2012  . DILATION AND CURETTAGE OF UTERUS    . ENDOMETRIAL ABLATION    . LAPAROSCOPIC LYSIS INTESTINAL ADHESIONS    . LUMBAR DISC SURGERY  DR. Red Christians- [place]  . SKIN SURGERY     skin sarcomas removed  . TONSILLECTOMY     1955  . TONSILLECTOMY      Current Medications: Current Meds  Medication Sig  . albuterol (PROVENTIL HFA;VENTOLIN HFA) 108 (90 Base) MCG/ACT inhaler Inhale 1-2 puffs into the lungs as needed for wheezing.   Marland Kitchen aspirin 325 MG tablet Take 325 mg by mouth daily.  Marland Kitchen azithromycin (ZITHROMAX) 500 MG tablet Use as directed one (1) hour prior to dental procedures.  Marland Kitchen CALCIUM CITRATE PO Take 1 tablet by mouth daily.  . carvedilol (COREG) 6.25 MG tablet TAKE 1 TABLET TWICE DAILY WITH MEALS  . cetirizine (ZYRTEC) 10 MG tablet Take 10 mg by mouth daily. Alternates every 3 months with Claritin. Rotates every 3 months  . Cholecalciferol 2000 units TABS Take 1 tablet (2,000 Units total) by mouth daily.  . fluticasone  (FLONASE) 50 MCG/ACT nasal spray USE 1 SPRAY IN EACH NOSTRIL EVERY DAY  . furosemide (LASIX) 20 MG tablet TAKE 1 TABLET DAILY AS NEEDED FOR EDEMA OR WEIGHT GAIN. TAKE POTASSIUM 10MEQ WHEN YOU TAKE LASIX  . loratadine (CLARITIN) 10 MG tablet Take 10 mg by mouth daily. Alternates every 3 months with Zyrtec.  . meclizine (ANTIVERT) 12.5 MG tablet Take 1 tablet (12.5 mg total) by mouth 3 (three) times daily as needed for dizziness.  Marland Kitchen omeprazole (PRILOSEC) 40 MG capsule TAKE 1 CAPSULE EVERY DAY  . potassium chloride (K-DUR,KLOR-CON) 10 MEQ tablet Take 1 tablet by mouth on the days you take lasix (furosemide)  . simvastatin (ZOCOR) 40 MG tablet TAKE 1 TABLET AT BEDTIME     Allergies:   Amoxicillin, Crestor [rosuvastatin], Lipitor [atorvastatin], Neomycin, Sulfamethoxazole-trimethoprim, Tape, Neosporin [neomycin-bacitracin zn-polymyx], and Polysporin [bacitracin-polymyxin b]   Social History   Socioeconomic History  . Marital status: Married    Spouse name: Not on file  . Number of children: 2  . Years of education: Not on file  . Highest education level: Not on file  Occupational History  . Occupation: retired    Fish farm manager: RETIRED    Comment: [employer], TEFL teacher, CERTIFIED AS SPECIAL ED TEACHER  Tobacco Use  . Smoking status: Former Smoker    Packs/day: 1.00    Years: 40.00    Pack years: 40.00    Types: Cigarettes    Quit date: 02/03/2000    Years since quitting: 19.2  . Smokeless tobacco: Former Systems developer    Types: Chew  . Tobacco comment: discussed LDCT   Substance and Sexual Activity  . Alcohol use: No    Alcohol/week: 0.0 standard drinks  . Drug use: No  . Sexual activity: Yes  Other Topics Concern  . Not on file  Social History Narrative   MARRIED   1 SON 1 DAUGHTER 2 GRANDCHILDREN   Music therapist, NOW FULL TIME CAREGIVER   LIVES IN [place], GREW UP IN [place].   Social Determinants of Health   Financial Resource Strain:   . Difficulty of Paying Living Expenses:     Food Insecurity:   . Worried About Charity fundraiser in the Last Year:   . Arboriculturist in the Last Year:   Transportation Needs:   . Film/video editor (Medical):   Marland Kitchen Lack of Transportation (Non-Medical):   Physical Activity:   . Days of Exercise per Week:   . Minutes of Exercise per Session:   Stress:   . Feeling of Stress :   Social Connections:   .  Frequency of Communication with Friends and Family:   . Frequency of Social Gatherings with Friends and Family:   . Attends Religious Services:   . Active Member of Clubs or Organizations:   . Attends Archivist Meetings:   Marland Kitchen Marital Status:      Family History: The patient's family history includes Breast cancer in her maternal aunt, mother, paternal grandmother, and sister; CAD in her father; Colon cancer in her maternal aunt and paternal uncle; Diabetes type II in her mother; Heart attack in her father; Heart disease in her mother; Hypertension in her mother; Hypothyroidism in her mother; Pancreatic cancer in an other family member. There is no history of Stomach cancer, Throat cancer, or Esophageal cancer.  ROS:   Please see the history of present illness.    All other systems reviewed and are negative.  EKGs/Labs/Other Studies Reviewed:    The following studies were reviewed today: Echo 10-04-2016: Study Conclusions   - Left ventricle: The cavity size was normal. Wall thickness was  increased in a pattern of mild LVH. Systolic function was normal.  The estimated ejection fraction was in the range of 60% to 65%.  The study is not technically sufficient to allow evaluation of LV  diastolic function.  - Aortic valve: Normal appearing bioprosthetic AV with no peri  valvular regurgitation stable mean gradient since 2015  - Mitral valve: Severely calcified annulus. There was mild  regurgitation.  - Left atrium: The atrium was moderately dilated.  - Atrial septum: No defect or patent foramen ovale  was identified.   EKG:  EKG is not ordered today.   Recent Labs: 06/08/2018: TSH 4.74 09/12/2018: ALT 23; BUN 12; Creatinine, Ser 0.65; Hemoglobin 13.2; Platelets 211; Potassium 4.0; Sodium 139  Recent Lipid Panel    Component Value Date/Time   CHOL 150 01/06/2018 1113   TRIG 237 (H) 01/06/2018 1113   HDL 42 01/06/2018 1113   CHOLHDL 3.6 01/06/2018 1113   CHOLHDL 4 02/17/2017 1353   VLDL 74.4 (H) 02/17/2017 1353   LDLCALC 61 01/06/2018 1113   LDLDIRECT 79 01/06/2018 1113   LDLDIRECT 101.0 02/17/2017 1353    Physical Exam:    VS:  BP (!) 160/82   Pulse (!) 54   Ht 5\' 2"  (1.575 m)   Wt 178 lb 12.8 oz (81.1 kg)   SpO2 96%   BMI 32.70 kg/m     Wt Readings from Last 3 Encounters:  04/17/19 178 lb 12.8 oz (81.1 kg)  10/11/18 175 lb 12.8 oz (79.7 kg)  09/29/18 178 lb (80.7 kg)     GEN:  Well nourished, well developed in no acute distress HEENT: Normal NECK: No JVD; No carotid bruits LYMPHATICS: No lymphadenopathy CARDIAC: RRR, 2/6 systolic murmur at the right upper sternal border, no diastolic murmur RESPIRATORY:  Clear to auscultation without rales, wheezing or rhonchi  ABDOMEN: Soft, non-tender, non-distended MUSCULOSKELETAL: Trace bilateral pretibial edema; No deformity  SKIN: Warm and dry NEUROLOGIC:  Alert and oriented x 3 PSYCHIATRIC:  Normal affect   ASSESSMENT:    1. Coronary artery disease involving native coronary artery of native heart with angina pectoris ([place])   2. Rheumatic aortic stenosis    PLAN:    In order of problems listed above:  1. I reviewed the patient's prior cardiac catheterization predating her heart surgery.  She had single-vessel disease involving the RCA.  She underwent bypass at that time and now has developed CCS class II symptoms of recurrent angina.  I  have recommended a Lexiscan Myoview stress test for ischemic evaluation.  She will continue on aspirin and a beta-blocker.  We discussed consideration of a long-acting nitrate but she  would like to wait until her testing is completed. 2. The patient has a history of aortic stenosis status post bioprosthetic aortic valve replacement.  Her last echocardiogram from 2018 is reviewed as above.  Considering her symptoms of exertional dyspnea and angina, I have recommended an updated echocardiogram to reevaluate cardiac function and function of her aortic bioprosthesis.   Medication Adjustments/Labs and Tests Ordered: Current medicines are reviewed at length with the patient today.  Concerns regarding medicines are outlined above.  Orders Placed This Encounter  Procedures  . MYOCARDIAL PERFUSION IMAGING  . ECHOCARDIOGRAM COMPLETE   No orders of the defined types were placed in this encounter.   Patient Instructions  Medication Instructions:  Your provider recommends that you continue on your current medications as directed. Please refer to the Current Medication list given to you today.   *If you need a refill on your cardiac medications before your next appointment, please call your pharmacy*  Testing/Procedures: Your provider has requested that you have a lexiscan myoview. For further information please visit [email or web address]. Please follow instruction sheet, as given.  Your provider has requested that you have an echocardiogram. Echocardiography is a painless test that uses sound waves to create images of your heart. It provides your doctor with information about the size and shape of your heart and how well your heart's chambers and valves are working. This procedure takes approximately one hour. There are no restrictions for this procedure.  Follow-Up: At [facility], you and your health needs are our priority.  As part of our continuing mission to provide you with exceptional heart care, we have created designated Provider Care Teams.  These Care Teams include your primary Cardiologist (physician) and Advanced Practice Providers (APPs -  Physician Assistants and  Nurse Practitioners) who all work together to provide you with the care you need, when you need it. Your next appointment:   6 month(s) The format for your next appointment:   In Person Provider:   Richardson Dopp, PA-C     Signed, Sherren Mocha, MD  04/19/2019 1:42 PM    [place]

## 2019-04-17 NOTE — Patient Instructions (Signed)
Medication Instructions:  Your provider recommends that you continue on your current medications as directed. Please refer to the Current Medication list given to you today.   *If you need a refill on your cardiac medications before your next appointment, please call your pharmacy*  Testing/Procedures: Your provider has requested that you have a lexiscan myoview. For further information please visit [email or web address]. Please follow instruction sheet, as given.  Your provider has requested that you have an echocardiogram. Echocardiography is a painless test that uses sound waves to create images of your heart. It provides your doctor with information about the size and shape of your heart and how well your heart's chambers and valves are working. This procedure takes approximately one hour. There are no restrictions for this procedure.  Follow-Up: At [facility], you and your health needs are our priority.  As part of our continuing mission to provide you with exceptional heart care, we have created designated Provider Care Teams.  These Care Teams include your primary Cardiologist (physician) and Advanced Practice Providers (APPs -  Physician Assistants and Nurse Practitioners) who all work together to provide you with the care you need, when you need it. Your next appointment:   6 month(s) The format for your next appointment:   In Person Provider:   Richardson Dopp, PA-C

## 2019-04-19 ENCOUNTER — Encounter: Payer: Self-pay | Admitting: Cardiovascular Disease

## 2019-04-26 ENCOUNTER — Telehealth (HOSPITAL_COMMUNITY): Payer: Self-pay | Admitting: *Deleted

## 2019-04-26 NOTE — Telephone Encounter (Signed)
Patient given detailed instructions per Myocardial Perfusion Study Information Sheet for the test on 05/03/19. Patient notified to arrive 15 minutes early and that it is imperative to arrive on time for appointment to keep from having the test rescheduled.  If you need to cancel or reschedule your appointment, please call the office within 24 hours of your appointment. . Patient verbalized understanding. Kirstie Peri

## 2019-05-03 ENCOUNTER — Other Ambulatory Visit: Payer: Self-pay

## 2019-05-03 ENCOUNTER — Ambulatory Visit (HOSPITAL_COMMUNITY): Payer: Medicare HMO | Attending: Internal Medicine

## 2019-05-03 ENCOUNTER — Ambulatory Visit (HOSPITAL_BASED_OUTPATIENT_CLINIC_OR_DEPARTMENT_OTHER): Payer: Medicare HMO

## 2019-05-03 VITALS — Ht 62.0 in | Wt 178.0 lb

## 2019-05-03 DIAGNOSIS — R0789 Other chest pain: Secondary | ICD-10-CM | POA: Diagnosis not present

## 2019-05-03 DIAGNOSIS — I25119 Atherosclerotic heart disease of native coronary artery with unspecified angina pectoris: Secondary | ICD-10-CM

## 2019-05-03 DIAGNOSIS — I060 Rheumatic aortic stenosis: Secondary | ICD-10-CM | POA: Diagnosis not present

## 2019-05-03 LAB — MYOCARDIAL PERFUSION IMAGING
LV dias vol: 65 mL (ref 46–106)
LV sys vol: 21 mL
Peak HR: 78 {beats}/min
Rest HR: 58 {beats}/min
SDS: 0
SRS: 0
SSS: 0
TID: 0.87

## 2019-05-03 LAB — ECHOCARDIOGRAM COMPLETE
Height: 62 in
Weight: 2848 oz

## 2019-05-03 MED ORDER — REGADENOSON 0.4 MG/5ML IV SOLN
0.4000 mg | Freq: Once | INTRAVENOUS | Status: AC
  Administered 2019-05-03: 0.4 mg via INTRAVENOUS

## 2019-05-03 MED ORDER — TECHNETIUM TC 99M TETROFOSMIN IV KIT
32.4000 | PACK | Freq: Once | INTRAVENOUS | Status: AC | PRN
  Administered 2019-05-03: 32.4 via INTRAVENOUS
  Filled 2019-05-03: qty 33

## 2019-05-03 MED ORDER — TECHNETIUM TC 99M TETROFOSMIN IV KIT
10.6000 | PACK | Freq: Once | INTRAVENOUS | Status: AC | PRN
  Administered 2019-05-03: 10.6 via INTRAVENOUS
  Filled 2019-05-03: qty 11

## 2019-05-04 ENCOUNTER — Ambulatory Visit (INDEPENDENT_AMBULATORY_CARE_PROVIDER_SITE_OTHER): Payer: Medicare HMO

## 2019-05-04 VITALS — BP 132/70 | HR 63 | Temp 98.2°F | Resp 16 | Ht 62.0 in | Wt 180.0 lb

## 2019-05-04 DIAGNOSIS — Z0000 Encounter for general adult medical examination without abnormal findings: Secondary | ICD-10-CM

## 2019-05-04 NOTE — Progress Notes (Addendum)
Subjective:   Heather Crawford is a 74 y.o. female who presents for Medicare Annual (Subsequent) preventive examination.  Review of Systems:  Medicare Wellness Exam    Cardiac Risk Factors include: advanced age (>51men, >71 women);dyslipidemia;hypertension;Other (see comment), Risk factor comments: Cardiac issues  Sleep Patterns: Sleeps well during the night Home Safety/Smoke Alarms: Feels safe in home.  Smoke alarms in place.     Objective:     Vitals: BP 132/70 (BP Location: Left Arm, Patient Position: Sitting, Cuff Size: Large)   Pulse 63   Temp 98.2 F (36.8 C)   Resp 16   Ht 5\' 2"  (1.575 m)   Wt 180 lb (81.6 kg)   SpO2 95%   BMI 32.92 kg/m   Body mass index is 32.92 kg/m.  Advanced Directives 05/04/2019 09/12/2018 04/04/2018 02/08/2017 07/13/2016 11/16/2015 11/16/2015  Does Patient Have a Medical Advance Directive? Yes No Yes Yes Yes No No  Type of Paramedic of [place];Living will - [place];Living will [place];Living will [place];Living will - -  Does patient want to make changes to medical advance directive? No - Patient declined - - - - - -  Copy of [place] in Chart? No - copy requested - No - copy requested No - copy requested - - -  Would patient like information on creating a medical advance directive? - No - Patient declined - - - No - patient declined information No - patient declined information  Pre-existing out of facility DNR order (yellow form or pink MOST form) - - - - - - -    Tobacco Social History   Tobacco Use  Smoking Status Former Smoker  . Packs/day: 1.00  . Years: 40.00  . Pack years: 40.00  . Types: Cigarettes  . Quit date: 02/03/2000  . Years since quitting: 19.2  Smokeless Tobacco Former Systems developer  . Types: Chew  Tobacco Comment   discussed LDCT      Counseling given: No Comment: discussed LDCT    Clinical Intake:  Pre-visit  preparation completed: Yes  Pain : No/denies pain Pain Score: 0-No pain     Nutritional Risks: None Diabetes: No  How often do you need to have someone help you when you read instructions, pamphlets, or other written materials from your doctor or pharmacy?: 1 - Never  Interpreter Needed?: No  Information entered by :: Ellisa Devivo N. Lowell Guitar, LPN  Past Medical History:  Diagnosis Date  . Allergic rhinitis, cause unspecified   . Aortic stenosis    a.  echo 1/12 w/mildly dilated LV, EF 45-50% w/paradoxical septal motion consistent w/ LBBB, moderate aortic stenosis w/mean gradient 27 mmHg, trivial AI, mild to moderate MR w/calcified mitral valve;   b. s/p AVR with 21 mm Magna Ease valve  . Aortic valve replaced 2012  . Arthritis    joint pain  . CAD (coronary artery disease)    a. s/p CABG 4/12: RIMA-RCA  . CAD, multiple vessel    sees Dr Aundra Dubin every 3-4 months  . Cancer ([place])    skin sarcomas removed  . CHF (congestive heart failure) (HCC)    chronic diastolic CHF  . Chronic back pain    H/O BACK SURGERY  . Coronary artery disease    adenosine myoview 2/12 w/EF 56%, ISCHEMIA W/SCAR IN THE MID TO APICAL ANTERIO WALL, SEPTAL WALL, AND APEX. LHC 3/12 W/EF 55%, 40% OSTIAL RCA, 60% MID RCA, 95%  DISTAL RC A, INTERVENTION  ATTEMPTED BUT UNABLE TO ADEQUADATELY SEAT  CATHETER.  . Diverticulitis of colon (without mention of hemorrhage)(562.11)   . Diverticulosis   . Esophageal reflux   . Glucose intolerance (impaired glucose tolerance)    A1C 6.3 (4/12)  . H/O hiatal hernia   . H/O vertigo    with fluid  in ears  . Heart murmur   . Hemorrhoids   . Hemorrhoids   . History of rheumatic fever   . Hx of hysterectomy   . Hx: UTI (urinary tract infection)   . Hyperlipidemia   . LBBB (left bundle branch block)   . Myocardial infarction ([employer]) 2002  . Pneumonia   . PONV (postoperative nausea and vomiting)   . 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 Snoring   . Urinary frequency    Past  Surgical History:  Procedure Laterality Date  . ABDOMINAL HYSTERECTOMY    . APPENDECTOMY  1973  . BACK SURGERY     Lumbar Lam and discectomy x 2  . BREAST BIOPSY Right 10/2011  . BREAST LUMPECTOMY Right   . CARDIAC CATHETERIZATION  2012  . CARDIAC VALVE REPLACEMENT  2012   Aortic  . CORONARY ARTERY BYPASS GRAFT  2012  . DILATION AND CURETTAGE OF UTERUS    . ENDOMETRIAL ABLATION    . LAPAROSCOPIC LYSIS INTESTINAL ADHESIONS    . LUMBAR [place]  . SKIN SURGERY     skin sarcomas removed  . TONSILLECTOMY     1955  . TONSILLECTOMY     Family History  Problem Relation Age of Onset  . Hypertension Mother   . Breast cancer Mother   . Hypothyroidism Mother   . Diabetes type II Mother   . Heart disease Mother   . Heart attack Father        CABG @ 36  . CAD Father   . Breast cancer Sister        x 2  . Colon cancer Maternal Aunt   . Breast cancer Maternal Aunt   . Colon cancer Paternal Uncle   . Pancreatic cancer Other        PGA  . Breast cancer Paternal Grandmother   . Stomach cancer Neg Hx   . Throat cancer Neg Hx   . Esophageal cancer Neg Hx    Social History   Socioeconomic History  . Marital status: Married    Spouse name: Not on file  . Number of children: 2  . Years of education: Not on file  . Highest education level: Not on file  Occupational History  . Occupation: retired    Fish farm manager: RETIRED    Comment: [employer], TEFL teacher, CERTIFIED AS SPECIAL ED TEACHER  Tobacco Use  . Smoking status: Former Smoker    Packs/day: 1.00    Years: 40.00    Pack years: 40.00    Types: Cigarettes    Quit date: 02/03/2000    Years since quitting: 19.2  . Smokeless tobacco: Former Systems developer    Types: Chew  . Tobacco comment: discussed LDCT   Substance and Sexual Activity  . Alcohol use: No    Alcohol/week: 0.0 standard drinks  . Drug use: No  . Sexual activity: Yes  Other Topics Concern  . Not on file  Social History Narrative   MARRIED    1 SON 1 DAUGHTER 2 GRANDCHILDREN   [place], NOW FULL TIME CAREGIVER  LIVES IN [place], GREW UP IN [place].   Social Determinants of Health   Financial Resource Strain:   . Difficulty of Paying Living Expenses:   Food Insecurity:   . Worried About Charity fundraiser in the Last Year:   . Arboriculturist in the Last Year:   Transportation Needs:   . Film/video editor (Medical):   Marland Kitchen Lack of Transportation (Non-Medical):   Physical Activity:   . Days of Exercise per Week:   . Minutes of Exercise per Session:   Stress:   . Feeling of Stress :   Social Connections:   . Frequency of Communication with Friends and Family:   . Frequency of Social Gatherings with Friends and Family:   . Attends Religious Services:   . Active Member of Clubs or Organizations:   . Attends Archivist Meetings:   Marland Kitchen Marital Status:     Outpatient Encounter Medications as of 05/04/2019  Medication Sig  . albuterol (PROVENTIL HFA;VENTOLIN HFA) 108 (90 Base) MCG/ACT inhaler Inhale 1-2 puffs into the lungs as needed for wheezing.   Marland Kitchen aspirin 325 MG tablet Take 325 mg by mouth daily.  Marland Kitchen azithromycin (ZITHROMAX) 500 MG tablet Use as directed one (1) hour prior to dental procedures.  Marland Kitchen CALCIUM CITRATE PO Take 1 tablet by mouth daily.  . carvedilol (COREG) 6.25 MG tablet TAKE 1 TABLET TWICE DAILY WITH MEALS  . cetirizine (ZYRTEC) 10 MG tablet Take 10 mg by mouth daily. Alternates every 3 months with Claritin. Rotates every 3 months  . Cholecalciferol 2000 units TABS Take 1 tablet (2,000 Units total) by mouth daily.  . fluticasone (FLONASE) 50 MCG/ACT nasal spray USE 1 SPRAY IN EACH NOSTRIL EVERY DAY  . furosemide (LASIX) 20 MG tablet TAKE 1 TABLET DAILY AS NEEDED FOR EDEMA OR WEIGHT GAIN. TAKE POTASSIUM 10MEQ WHEN YOU TAKE LASIX  . loratadine (CLARITIN) 10 MG tablet Take 10 mg by mouth daily. Alternates every 3 months with Zyrtec.  . meclizine (ANTIVERT) 12.5 MG tablet Take 1 tablet (12.5  mg total) by mouth 3 (three) times daily as needed for dizziness.  Marland Kitchen omeprazole (PRILOSEC) 40 MG capsule TAKE 1 CAPSULE EVERY DAY  . potassium chloride (K-DUR,KLOR-CON) 10 MEQ tablet Take 1 tablet by mouth on the days you take lasix (furosemide)  . simvastatin (ZOCOR) 40 MG tablet TAKE 1 TABLET AT BEDTIME   No facility-administered encounter medications on file as of 05/04/2019.    Activities of Daily Living In your present state of health, do you have any difficulty performing the following activities: 05/04/2019  Hearing? N  Vision? N  Difficulty concentrating or making decisions? N  Walking or climbing stairs? N  Dressing or bathing? N  Doing errands, shopping? N  Preparing Food and eating ? N  Using the Toilet? N  In the past six months, have you accidently leaked urine? Y  Comment wears a pad  Do you have problems with loss of bowel control? N  Managing your Medications? N  Managing your Finances? N  Housekeeping or managing your Housekeeping? N  Some recent data might be hidden    Patient Care Team: Janith Lima, MD as PCP - General (Internal Medicine) Sherren Mocha, MD as PCP - Cardiology (Cardiology) Melrose Nakayama, MD (Cardiothoracic Surgery) Latanya Maudlin, MD as Consulting Physician (Orthopedic Surgery)    Assessment:   This is a routine wellness examination for Becca.  Exercise Activities and Dietary recommendations Current Exercise Habits: Home exercise  routine, Type of exercise: walking(around in the house/yard work), Time (Minutes): 30, Frequency (Times/Week): 5, Weekly Exercise (Minutes/Week): 150, Intensity: Moderate, Exercise limited by: cardiac condition(s)  Goals    . Exercise 150 minutes per week (moderate activity)     Will start walking x 30 minutes 3 days a week  Will build up to this time and then move on.  Keep journal    . Patient Stated     Lose weight, I will watch what I eat by lowering sugar and carbohydrates, start to  exercise, and join [employer].    . Patient Stated     Stay active as possible by walking around [place], doing house work, and going to [employer].    . Patient Stated     Not to have heart surgery anymore    . Weight (lb) < 160 lb (72.6 kg)     Will try again to lose some weight;  Will drink more water       Fall Risk Fall Risk  05/04/2019 04/04/2018 02/08/2017 11/16/2015 10/22/2015  Falls in the past year? 0 0 Yes No No  Number falls in past yr: 0 0 1 - -  Injury with Fall? 0 - - - -  Risk for fall due to : No Fall Risks - - - -  Follow up Falls evaluation completed;Education provided;Falls prevention discussed - - - -   Is the patient's home free of loose throw rugs in walkways, pet beds, electrical cords, etc?   yes      Grab bars in the bathroom? yes      Handrails on the stairs?   yes      Adequate lighting?   yes   Depression Screen PHQ 2/9 Scores 05/04/2019 04/04/2018 02/08/2017 11/16/2015  PHQ - 2 Score 0 0 0 0  PHQ- 9 Score - - 1 -     Cognitive Function MMSE - Mini Mental State Exam 02/08/2017 10/22/2015 10/11/2014  Not completed: - (No Data) Unable to complete  Orientation to time 5 - -  Orientation to Place 5 - -  Registration 3 - -  Attention/ Calculation 5 - -  Recall 2 - -  Language- name 2 objects 2 - -  Language- repeat 1 - -  Language- follow 3 step command 3 - -  Language- read & follow direction 1 - -  Write a sentence 1 - -  Copy design 1 - -  Total score 29 - -     6CIT Screen 05/04/2019  What Year? 0 points  What month? 0 points  What time? 0 points  Count back from 20 0 points  Months in reverse 0 points  Repeat phrase 0 points  Total Score 0    Immunization History  Administered Date(s) Administered  . Hep A / Hep B 10/17/2014, 04/16/2015  . Hepatitis B, adult 11/16/2014  . Pneumococcal Conjugate-13 08/16/2013  . Pneumococcal Polysaccharide-23 08/12/2011  . Td 02/21/2010  . Tdap 02/21/2010    Qualifies for Shingles Vaccine? Allergic  to ingredients  Screening Tests Health Maintenance  Topic Date Due  . OPHTHALMOLOGY EXAM  11/18/2018  . HEMOGLOBIN A1C  12/09/2018  . FOOT EXAM  04/04/2019  . URINE MICROALBUMIN  06/08/2019  . INFLUENZA VACCINE  09/03/2019  . TETANUS/TDAP  02/22/2020  . COLONOSCOPY  04/29/2020  . DEXA SCAN  Completed  . Hepatitis C Screening  Completed  . PNA vac Low Risk Adult  Completed    Cancer  Screenings: Lung: Low Dose CT Chest recommended if Age 14-80 years, 30 pack-year currently smoking OR have quit w/in 15years. Patient does not qualify. Breast:  Up to date on Mammogram? Yes   Up to date of Bone Density/Dexa? No; will schedule Colorectal: yes      Plan:     Reviewed health maintenance screenings with patient today and relevant education, vaccines, and/or referrals were provided.   Continue doing brain stimulating activities (puzzles, reading, adult coloring books, staying active) to keep memory sharp.   Continue to eat heart healthy diet (full of fruits, vegetables, whole grains, lean protein, water--limit salt, fat, and sugar intake) and increase physical activity as tolerated.   I have personally reviewed and noted the following in the patient's chart:   . Medical and social history . Use of alcohol, tobacco or illicit drugs  . Current medications and supplements . Functional ability and status . Nutritional status . Physical activity . Advanced directives . List of other physicians . Hospitalizations, surgeries, and ER visits in previous 12 months . Vitals . Screenings to include cognitive, depression, and falls . Referrals and appointments  In addition, I have reviewed and discussed with patient certain preventive protocols, quality metrics, and best practice recommendations. A written personalized care plan for preventive services as well as general preventive health recommendations were provided to patient.     Sheral Flow, LPN  [id number] Nurse Health  Advisor   Medical screening examination/treatment/procedure(s) were performed by non-physician practitioner and as supervising physician I was immediately available for consultation/collaboration. I agree with above. Scarlette Calico, MD

## 2019-05-04 NOTE — Patient Instructions (Addendum)
Heather Crawford , Thank you for taking time to come for your Medicare Wellness Visit. I appreciate your ongoing commitment to your health goals. Please review the following plan we discussed and let me know if I can assist you in the future.   Screening recommendations/referrals: Colorectal Screening: last done 04/30/2015 Mammogram: last done 11/24/2018 Bone Density: last done 02/09/2017  Vision and Dental Exams: Recommended annual ophthalmology exams for early detection of glaucoma and other disorders of the eye Recommended annual dental exams for proper oral hygiene  Diabetic Exams: Diabetic Eye Exam: last done 11/17/2017 Diabetic Foot Exam: last done 04/04/2018  Vaccinations: Influenza vaccine: N/D Pneumococcal vaccine: up to date (08/12/2011, 08/16/2013) Tdap vaccine: up to date (02/21/2010) Hep A: up to date (04/16/2015) Hep B: up to date (10/17/2014, 11/16/2014) Shingles vaccine: allergic Covid Vaccine: 02/14/2019, 03/07/2019  Advanced directives: Advance directives discussed with you today.  Please bring a copy of your POA (Power of [place]) and/or Living Will to your next appointment.  Goals:  Recommend to drink at least 6-8 8oz glasses of water per day.  Recommend to exercise for at least 150 minutes per week  Recommend to remove any items from the home that may cause slips or trips.  Recommend to decrease portion sizes by eating 3 small healthy meals and at least 2 healthy snacks per day.  Recommend to begin DASH diet as directed below  Recommend to continue efforts to reduce smoking habits until no longer smoking. Smoking Cessation literature is attached below.  Next appointment: Please schedule your Annual Wellness Visit with your Nurse Health Advisor in one year.  Preventive Care 76 Years and Older, Female Preventive care refers to lifestyle choices and visits with your health care provider that can promote health and wellness. What does preventive care include?  A yearly  physical exam. This is also called an annual well check.  Dental exams once or twice a year.  Routine eye exams. Ask your health care provider how often you should have your eyes checked.  Personal lifestyle choices, including:  Daily care of your teeth and gums.  Regular physical activity.  Eating a healthy diet.  Avoiding tobacco and drug use.  Limiting alcohol use.  Practicing safe sex.  Taking low-dose aspirin every day if recommended by your health care provider.  Taking vitamin and mineral supplements as recommended by your health care provider. What happens during an annual well check? The services and screenings done by your health care provider during your annual well check will depend on your age, overall health, lifestyle risk factors, and family history of disease. Counseling  Your health care provider may ask you questions about your:  Alcohol use.  Tobacco use.  Drug use.  Emotional well-being.  Home and relationship well-being.  Sexual activity.  Eating habits.  History of falls.  Memory and ability to understand (cognition).  Work and work Statistician.  Reproductive health. Screening  You may have the following tests or measurements:  Height, weight, and BMI.  Blood pressure.  Lipid and cholesterol levels. These may be checked every 5 years, or more frequently if you are over 27 years old.  Skin check.  Lung cancer screening. You may have this screening every year starting at age 42 if you have a 30-pack-year history of smoking and currently smoke or have quit within the past 15 years.  Fecal occult blood test (FOBT) of the stool. You may have this test every year starting at age 33.  Flexible sigmoidoscopy or  colonoscopy. You may have a sigmoidoscopy every 5 years or a colonoscopy every 10 years starting at age 36.  Hepatitis C blood test.  Hepatitis B blood test.  Sexually transmitted disease (STD) testing.  Diabetes screening.  This is done by checking your blood sugar (glucose) after you have not eaten for a while (fasting). You may have this done every 1-3 years.  Bone density scan. This is done to screen for osteoporosis. You may have this done starting at age 32.  Mammogram. This may be done every 1-2 years. Talk to your health care provider about how often you should have regular mammograms. Talk with your health care provider about your test results, treatment options, and if necessary, the need for more tests. Vaccines  Your health care provider may recommend certain vaccines, such as:  Influenza vaccine. This is recommended every year.  Tetanus, diphtheria, and acellular pertussis (Tdap, Td) vaccine. You may need a Td booster every 10 years.  Zoster vaccine. You may need this after age 68.  Pneumococcal 13-valent conjugate (PCV13) vaccine. One dose is recommended after age 77.  Pneumococcal polysaccharide (PPSV23) vaccine. One dose is recommended after age 30. Talk to your health care provider about which screenings and vaccines you need and how often you need them. This information is not intended to replace advice given to you by your health care provider. Make sure you discuss any questions you have with your health care provider. Document Released: 02/15/2015 Document Revised: 10/09/2015 Document Reviewed: 11/20/2014 Elsevier Interactive Patient Education  2017 [place] Prevention in the Home Falls can cause injuries. They can happen to people of all ages. There are many things you can do to make your home safe and to help prevent falls. What can I do on the outside of my home?  Regularly fix the edges of walkways and driveways and fix any cracks.  Remove anything that might make you trip as you walk through a door, such as a raised step or threshold.  Trim any bushes or trees on the path to your home.  Use bright outdoor lighting.  Clear any walking paths of anything that might  make someone trip, such as rocks or tools.  Regularly check to see if handrails are loose or broken. Make sure that both sides of any steps have handrails.  Any raised decks and porches should have guardrails on the edges.  Have any leaves, snow, or ice cleared regularly.  Use sand or salt on walking paths during winter.  Clean up any spills in your garage right away. This includes oil or grease spills. What can I do in the bathroom?  Use night lights.  Install grab bars by the toilet and in the tub and shower. Do not use towel bars as grab bars.  Use non-skid mats or decals in the tub or shower.  If you need to sit down in the shower, use a plastic, non-slip stool.  Keep the floor dry. Clean up any water that spills on the floor as soon as it happens.  Remove soap buildup in the tub or shower regularly.  Attach bath mats securely with double-sided non-slip rug tape.  Do not have throw rugs and other things on the floor that can make you trip. What can I do in the bedroom?  Use night lights.  Make sure that you have a light by your bed that is easy to reach.  Do not use any sheets or blankets that are too  big for your bed. They should not hang down onto the floor.  Have a firm chair that has side arms. You can use this for support while you get dressed.  Do not have throw rugs and other things on the floor that can make you trip. What can I do in the kitchen?  Clean up any spills right away.  Avoid walking on wet floors.  Keep items that you use a lot in easy-to-reach places.  If you need to reach something above you, use a strong step stool that has a grab bar.  Keep electrical cords out of the way.  Do not use floor polish or wax that makes floors slippery. If you must use wax, use non-skid floor wax.  Do not have throw rugs and other things on the floor that can make you trip. What can I do with my stairs?  Do not leave any items on the stairs.  Make sure  that there are handrails on both sides of the stairs and use them. Fix handrails that are broken or loose. Make sure that handrails are as long as the stairways.  Check any carpeting to make sure that it is firmly attached to the stairs. Fix any carpet that is loose or worn.  Avoid having throw rugs at the top or bottom of the stairs. If you do have throw rugs, attach them to the floor with carpet tape.  Make sure that you have a light switch at the top of the stairs and the bottom of the stairs. If you do not have them, ask someone to add them for you. What else can I do to help prevent falls?  Wear shoes that:  Do not have high heels.  Have rubber bottoms.  Are comfortable and fit you well.  Are closed at the toe. Do not wear sandals.  If you use a stepladder:  Make sure that it is fully opened. Do not climb a closed stepladder.  Make sure that both sides of the stepladder are locked into place.  Ask someone to hold it for you, if possible.  Clearly mark and make sure that you can see:  Any grab bars or handrails.  First and last steps.  Where the edge of each step is.  Use tools that help you move around (mobility aids) if they are needed. These include:  Canes.  Walkers.  Scooters.  Crutches.  Turn on the lights when you go into a dark area. Replace any light bulbs as soon as they burn out.  Set up your furniture so you have a clear path. Avoid moving your furniture around.  If any of your floors are uneven, fix them.  If there are any pets around you, be aware of where they are.  Review your medicines with your doctor. Some medicines can make you feel dizzy. This can increase your chance of falling. Ask your doctor what other things that you can do to help prevent falls. This information is not intended to replace advice given to you by your health care provider. Make sure you discuss any questions you have with your health care provider. Document  Released: 11/15/2008 Document Revised: 06/27/2015 Document Reviewed: 02/23/2014 Elsevier Interactive Patient Education  2017 [employer].

## 2019-05-29 ENCOUNTER — Ambulatory Visit: Payer: Medicare HMO | Admitting: Cardiovascular Disease

## 2019-06-08 ENCOUNTER — Other Ambulatory Visit: Payer: Self-pay | Admitting: Physician Assistant

## 2019-06-08 DIAGNOSIS — R600 Localized edema: Secondary | ICD-10-CM

## 2019-06-08 DIAGNOSIS — Z952 Presence of prosthetic heart valve: Secondary | ICD-10-CM

## 2019-06-12 ENCOUNTER — Encounter: Payer: Medicare HMO | Admitting: Internal Medicine

## 2019-06-20 ENCOUNTER — Encounter: Payer: Self-pay | Admitting: Internal Medicine

## 2019-06-20 ENCOUNTER — Ambulatory Visit (INDEPENDENT_AMBULATORY_CARE_PROVIDER_SITE_OTHER): Payer: Medicare HMO | Admitting: Internal Medicine

## 2019-06-20 ENCOUNTER — Other Ambulatory Visit: Payer: Self-pay

## 2019-06-20 VITALS — BP 158/68 | HR 55 | Temp 97.8°F | Resp 16 | Ht 62.0 in | Wt 176.0 lb

## 2019-06-20 DIAGNOSIS — E118 Type 2 diabetes mellitus with unspecified complications: Secondary | ICD-10-CM | POA: Diagnosis not present

## 2019-06-20 DIAGNOSIS — M839 Adult osteomalacia, unspecified: Secondary | ICD-10-CM

## 2019-06-20 DIAGNOSIS — E781 Pure hyperglyceridemia: Secondary | ICD-10-CM | POA: Diagnosis not present

## 2019-06-20 DIAGNOSIS — I5032 Chronic diastolic (congestive) heart failure: Secondary | ICD-10-CM

## 2019-06-20 DIAGNOSIS — D0439 Carcinoma in situ of skin of other parts of face: Secondary | ICD-10-CM | POA: Diagnosis not present

## 2019-06-20 DIAGNOSIS — Z0000 Encounter for general adult medical examination without abnormal findings: Secondary | ICD-10-CM | POA: Diagnosis not present

## 2019-06-20 DIAGNOSIS — E785 Hyperlipidemia, unspecified: Secondary | ICD-10-CM

## 2019-06-20 DIAGNOSIS — I2510 Atherosclerotic heart disease of native coronary artery without angina pectoris: Secondary | ICD-10-CM | POA: Diagnosis not present

## 2019-06-20 DIAGNOSIS — I10 Essential (primary) hypertension: Secondary | ICD-10-CM | POA: Diagnosis not present

## 2019-06-20 LAB — CBC WITH DIFFERENTIAL/PLATELET
Basophils Absolute: 0.1 10*3/uL (ref 0.0–0.1)
Basophils Relative: 1 % (ref 0.0–3.0)
Eosinophils Absolute: 0.1 10*3/uL (ref 0.0–0.7)
Eosinophils Relative: 1.1 % (ref 0.0–5.0)
HCT: 35.9 % — ABNORMAL LOW (ref 36.0–46.0)
Hemoglobin: 12.2 g/dL (ref 12.0–15.0)
Lymphocytes Relative: 34.3 % (ref 12.0–46.0)
Lymphs Abs: 2 10*3/uL (ref 0.7–4.0)
MCHC: 33.8 g/dL (ref 30.0–36.0)
MCV: 89.9 fl (ref 78.0–100.0)
Monocytes Absolute: 0.6 10*3/uL (ref 0.1–1.0)
Monocytes Relative: 9.7 % (ref 3.0–12.0)
Neutro Abs: 3.1 10*3/uL (ref 1.4–7.7)
Neutrophils Relative %: 53.9 % (ref 43.0–77.0)
Platelets: 213 10*3/uL (ref 150.0–400.0)
RBC: 3.99 Mil/uL (ref 3.87–5.11)
RDW: 13 % (ref 11.5–15.5)
WBC: 5.7 10*3/uL (ref 4.0–10.5)

## 2019-06-20 LAB — HEPATIC FUNCTION PANEL
ALT: 20 U/L (ref 0–35)
AST: 20 U/L (ref 0–37)
Albumin: 4.4 g/dL (ref 3.5–5.2)
Alkaline Phosphatase: 45 U/L (ref 39–117)
Bilirubin, Direct: 0.1 mg/dL (ref 0.0–0.3)
Total Bilirubin: 0.5 mg/dL (ref 0.2–1.2)
Total Protein: 7.1 g/dL (ref 6.0–8.3)

## 2019-06-20 LAB — BASIC METABOLIC PANEL
BUN: 15 mg/dL (ref 6–23)
CO2: 29 mEq/L (ref 19–32)
Calcium: 8.9 mg/dL (ref 8.4–10.5)
Chloride: 102 mEq/L (ref 96–112)
Creatinine, Ser: 0.68 mg/dL (ref 0.40–1.20)
GFR: 84.15 mL/min (ref >60.00)
Glucose, Bld: 127 mg/dL — ABNORMAL HIGH (ref 70–99)
Potassium: 4.2 mEq/L (ref 3.5–5.1)
Sodium: 139 mEq/L (ref 135–145)

## 2019-06-20 LAB — URINALYSIS, ROUTINE W REFLEX MICROSCOPIC
Bilirubin Urine: NEGATIVE
Hgb urine dipstick: NEGATIVE
Ketones, ur: NEGATIVE
Nitrite: NEGATIVE
RBC / HPF: NONE SEEN (ref 0–?)
Specific Gravity, Urine: 1.01 (ref 1.000–1.030)
Total Protein, Urine: NEGATIVE
Urine Glucose: NEGATIVE
Urobilinogen, UA: 0.2 (ref 0.0–1.0)
pH: 6.5 (ref 5.0–8.0)

## 2019-06-20 LAB — LDL CHOLESTEROL, DIRECT: Direct LDL: 69 mg/dL

## 2019-06-20 LAB — MICROALBUMIN / CREATININE URINE RATIO
Creatinine,U: 38.1 mg/dL
Microalb Creat Ratio: 1.8 mg/g (ref 0.0–30.0)
Microalb, Ur: <0.7 mg/dL (ref 0.0–1.9)

## 2019-06-20 LAB — LIPID PANEL
Cholesterol: 134 mg/dL (ref 0–200)
HDL: 36.4 mg/dL — ABNORMAL LOW (ref >39.00)
NonHDL: 97.65
Total CHOL/HDL Ratio: 4
Triglycerides: 216 mg/dL — ABNORMAL HIGH (ref 0.0–149.0)
VLDL: 43.2 mg/dL — ABNORMAL HIGH (ref 0.0–40.0)

## 2019-06-20 LAB — HEMOGLOBIN A1C: Hgb A1c MFr Bld: 6.9 % — ABNORMAL HIGH (ref 4.6–6.5)

## 2019-06-20 MED ORDER — IRBESARTAN 150 MG PO TABS: 150.0000 mg | ORAL_TABLET | Freq: Every day | ORAL | 1 refills | Status: DC

## 2019-06-20 NOTE — Patient Instructions (Signed)
Health Maintenance, Female Adopting a healthy lifestyle and getting preventive care are important in promoting health and wellness. Ask your health care provider about:  The right schedule for you to have regular tests and exams.  Things you can do on your own to prevent diseases and keep yourself healthy. What should I know about diet, weight, and exercise? Eat a healthy diet   Eat a diet that includes plenty of vegetables, fruits, low-fat dairy products, and lean protein.  Do not eat a lot of foods that are high in solid fats, added sugars, or sodium. Maintain a healthy weight Body mass index (BMI) is used to identify weight problems. It estimates body fat based on height and weight. Your health care provider can help determine your BMI and help you achieve or maintain a healthy weight. Get regular exercise Get regular exercise. This is one of the most important things you can do for your health. Most adults should:  Exercise for at least 150 minutes each week. The exercise should increase your heart rate and make you sweat (moderate-intensity exercise).  Do strengthening exercises at least twice a week. This is in addition to the moderate-intensity exercise.  Spend less time sitting. Even light physical activity can be beneficial. Watch cholesterol and blood lipids Have your blood tested for lipids and cholesterol at 76 years of age, then have this test every 5 years. Have your cholesterol levels checked more often if:  Your lipid or cholesterol levels are high.  You are older than 76 years of age.  You are at high risk for heart disease. What should I know about cancer screening? Depending on your health history and family history, you may need to have cancer screening at various ages. This may include screening for:  Breast cancer.  Cervical cancer.  Colorectal cancer.  Skin cancer.  Lung cancer. What should I know about heart disease, diabetes, and high blood  pressure? Blood pressure and heart disease  High blood pressure causes heart disease and increases the risk of stroke. This is more likely to develop in people who have high blood pressure readings, are of African descent, or are overweight.  Have your blood pressure checked: ? Every 3-5 years if you are 18-39 years of age. ? Every year if you are 40 years old or older. Diabetes Have regular diabetes screenings. This checks your fasting blood sugar level. Have the screening done:  Once every three years after age 40 if you are at a normal weight and have a low risk for diabetes.  More often and at a younger age if you are overweight or have a high risk for diabetes. What should I know about preventing infection? Hepatitis B If you have a higher risk for hepatitis B, you should be screened for this virus. Talk with your health care provider to find out if you are at risk for hepatitis B infection. Hepatitis C Testing is recommended for:  Everyone born from 1945 through 1965.  Anyone with known risk factors for hepatitis C. Sexually transmitted infections (STIs)  Get screened for STIs, including gonorrhea and chlamydia, if: ? You are sexually active and are younger than 76 years of age. ? You are older than 76 years of age and your health care provider tells you that you are at risk for this type of infection. ? Your sexual activity has changed since you were last screened, and you are at increased risk for chlamydia or gonorrhea. Ask your health care provider if   you are at risk.  Ask your health care provider about whether you are at high risk for HIV. Your health care provider may recommend a prescription medicine to help prevent HIV infection. If you choose to take medicine to prevent HIV, you should first get tested for HIV. You should then be tested every 3 months for as long as you are taking the medicine. Pregnancy  If you are about to stop having your period (premenopausal) and  you may become pregnant, seek counseling before you get pregnant.  Take 400 to 800 micrograms (mcg) of folic acid every day if you become pregnant.  Ask for birth control (contraception) if you want to prevent pregnancy. Osteoporosis and menopause Osteoporosis is a disease in which the bones lose minerals and strength with aging. This can result in bone fractures. If you are 65 years old or older, or if you are at risk for osteoporosis and fractures, ask your health care provider if you should:  Be screened for bone loss.  Take a calcium or vitamin D supplement to lower your risk of fractures.  Be given hormone replacement therapy (HRT) to treat symptoms of menopause. Follow these instructions at home: Lifestyle  Do not use any products that contain nicotine or tobacco, such as cigarettes, e-cigarettes, and chewing tobacco. If you need help quitting, ask your health care provider.  Do not use street drugs.  Do not share needles.  Ask your health care provider for help if you need support or information about quitting drugs. Alcohol use  Do not drink alcohol if: ? Your health care provider tells you not to drink. ? You are pregnant, may be pregnant, or are planning to become pregnant.  If you drink alcohol: ? Limit how much you use to 0-1 drink a day. ? Limit intake if you are breastfeeding.  Be aware of how much alcohol is in your drink. In the [place] one drink equals one 12 oz bottle of beer (355 mL), one 5 oz glass of wine (148 mL), or one 1 oz glass of hard liquor (44 mL). General instructions  Schedule regular health, dental, and eye exams.  Stay current with your vaccines.  Tell your health care provider if: ? You often feel depressed. ? You have ever been abused or do not feel safe at home. Summary  Adopting a healthy lifestyle and getting preventive care are important in promoting health and wellness.  Follow your health care provider's instructions about healthy  diet, exercising, and getting tested or screened for diseases.  Follow your health care provider's instructions on monitoring your cholesterol and blood pressure. This information is not intended to replace advice given to you by your health care provider. Make sure you discuss any questions you have with your health care provider. Document Revised: 01/12/2018 Document Reviewed: 01/12/2018 Elsevier Patient Education  2020 [employer]  

## 2019-06-20 NOTE — Progress Notes (Signed)
Subjective:  Patient ID: Heather Crawford, female    DOB: 12/31/1945  Age: 74 y.o. MRN: [id number]  CC: Annual Exam, Hyperlipidemia, Gastroesophageal Reflux, Hypertension, and Coronary Artery Disease  This visit occurred during the SARS-CoV-2 public health emergency.  Safety protocols were in place, including screening questions prior to the visit, additional usage of staff PPE, and extensive cleaning of exam room while observing appropriate contact time as indicated for disinfecting solutions.   HPI Heather Crawford presents for a CPX.  She complains of a 1 year history of dyspnea on exertion.  She recently had a cardiac evaluation and was found to have no evidence of ischemia.  Her recent echocardiogram revealed grade 2 diastolic dysfunction.  She denies chest pain, diaphoresis, dizziness, lightheadedness, palpitations, or edema.  She does not monitor her blood pressure.  She has a history of skin cancer and is concerned about a lesion on the right side adjacent to the bridge of her nose that is developed over the last few months.  Outpatient Medications Prior to Visit  Medication Sig Dispense Refill  . aspirin 325 MG tablet Take 325 mg by mouth daily.    Marland Kitchen CALCIUM CITRATE PO Take 1 tablet by mouth daily.    . carvedilol (COREG) 6.25 MG tablet TAKE 1 TABLET TWICE DAILY WITH MEALS 180 tablet 3  . Cholecalciferol 2000 units TABS Take 1 tablet (2,000 Units total) by mouth daily. 90 tablet 1  . fluticasone (FLONASE) 50 MCG/ACT nasal spray USE 1 SPRAY IN EACH NOSTRIL EVERY DAY 48 g 1  . furosemide (LASIX) 20 MG tablet TAKE 1 TABLET DAILY AS NEEDED FOR EDEMA OR WEIGHT GAIN. TAKE POTASSIUM 10MEQ WHEN YOU TAKE LASIX 90 tablet 3  . meclizine (ANTIVERT) 12.5 MG tablet Take 1 tablet (12.5 mg total) by mouth 3 (three) times daily as needed for dizziness. 90 tablet 3  . potassium chloride (K-DUR,KLOR-CON) 10 MEQ tablet Take 1 tablet by mouth on the days you take lasix (furosemide) 90 tablet 3  .  simvastatin (ZOCOR) 40 MG tablet TAKE 1 TABLET AT BEDTIME 90 tablet 2  . albuterol (PROVENTIL HFA;VENTOLIN HFA) 108 (90 Base) MCG/ACT inhaler Inhale 1-2 puffs into the lungs as needed for wheezing.     . cetirizine (ZYRTEC) 10 MG tablet Take 10 mg by mouth daily. Alternates every 3 months with Claritin. Rotates every 3 months    . loratadine (CLARITIN) 10 MG tablet Take 10 mg by mouth daily. Alternates every 3 months with Zyrtec.    Marland Kitchen azithromycin (ZITHROMAX) 500 MG tablet Use as directed one (1) hour prior to dental procedures.    Marland Kitchen omeprazole (PRILOSEC) 40 MG capsule TAKE 1 CAPSULE EVERY DAY (Patient not taking: Reported on 06/20/2019) 90 capsule 1   No facility-administered medications prior to visit.    ROS Review of Systems  Constitutional: Negative for appetite change, diaphoresis, fatigue and unexpected weight change.  HENT: Negative.  Negative for trouble swallowing.   Eyes: Negative.   Respiratory: Positive for shortness of breath. Negative for cough, chest tightness and wheezing.        ++DOE when she climbs a hill  Cardiovascular: Negative for chest pain, palpitations and leg swelling.  Gastrointestinal: Negative for abdominal pain, blood in stool, constipation, diarrhea, nausea and vomiting.  Endocrine: Negative.   Genitourinary: Negative.  Negative for difficulty urinating and dysuria.  Musculoskeletal: Negative.  Negative for myalgias.  Skin: Negative.   Neurological: Negative.  Negative for dizziness, weakness, light-headedness and numbness.  Hematological: Negative  for adenopathy. Does not bruise/bleed easily.  Psychiatric/Behavioral: Negative.     Objective:  BP (!) 158/68 (BP Location: Left Arm, Patient Position: Sitting, Cuff Size: Large)   Pulse (!) 55   Temp 97.8 F (36.6 C) (Oral)   Resp 16   Ht 5\' 2"  (1.575 m)   Wt 176 lb (79.8 kg)   SpO2 94%   BMI 32.19 kg/m   BP Readings from Last 3 Encounters:  06/20/19 (!) 158/68  05/04/19 132/70  04/17/19 (!)  160/82    Wt Readings from Last 3 Encounters:  06/20/19 176 lb (79.8 kg)  05/04/19 180 lb (81.6 kg)  05/03/19 178 lb (80.7 kg)    Physical Exam Vitals reviewed.  Constitutional:      Appearance: She is obese.  HENT:     Head:      Nose: Nose normal.     Mouth/Throat:     Mouth: Mucous membranes are moist.  Eyes:     General: No scleral icterus.    Conjunctiva/sclera: Conjunctivae normal.  Cardiovascular:     Rate and Rhythm: Normal rate and regular rhythm.     Heart sounds: Murmur present. No gallop.   Pulmonary:     Effort: Pulmonary effort is normal.     Breath sounds: No stridor. No wheezing, rhonchi or rales.  Abdominal:     General: Abdomen is protuberant. Bowel sounds are normal.     Palpations: There is no hepatomegaly, splenomegaly or mass.     Tenderness: There is no abdominal tenderness. There is no guarding.  Musculoskeletal:        General: Normal range of motion.     Cervical back: Neck supple.     Right lower leg: No edema.     Left lower leg: No edema.  Lymphadenopathy:     Cervical: No cervical adenopathy.  Skin:    General: Skin is warm and dry.     Findings: No rash.  Neurological:     General: No focal deficit present.     Mental Status: She is alert and oriented to person, place, and time. Mental status is at baseline.  Psychiatric:        Behavior: Behavior normal.     Lab Results  Component Value Date   WBC 5.7 06/20/2019   HGB 12.2 06/20/2019   HCT 35.9 (L) 06/20/2019   PLT 213.0 06/20/2019   GLUCOSE 127 (H) 06/20/2019   CHOL 134 06/20/2019   TRIG 216.0 (H) 06/20/2019   HDL 36.40 (L) 06/20/2019   LDLDIRECT 69.0 06/20/2019   LDLCALC 61 01/06/2018   ALT 20 06/20/2019   AST 20 06/20/2019   NA 139 06/20/2019   K 4.2 06/20/2019   CL 102 06/20/2019   CREATININE 0.68 06/20/2019   BUN 15 06/20/2019   CO2 29 06/20/2019   TSH 5.33 (H) 06/20/2019   INR 3.1 06/18/2010   HGBA1C 6.9 (H) 06/20/2019   MICROALBUR <0.7 06/20/2019    CT  Abdomen Pelvis W Contrast  Result Date: 10/12/2018 CLINICAL DATA:  74 year old female with history of right upper quadrant abdominal pain and epigastric pain. Bloating. Intermittent nausea since 09/12/2018. EXAM: CT ABDOMEN AND PELVIS WITH CONTRAST TECHNIQUE: Multidetector CT imaging of the abdomen and pelvis was performed using the standard protocol following bolus administration of intravenous contrast. CONTRAST:  164mL OMNIPAQUE IOHEXOL 300 MG/ML  SOLN COMPARISON:  No priors. FINDINGS: Lower chest: Severe calcifications of the mitral valve. Atherosclerotic calcifications in the descending thoracic aorta as well as the  right coronary artery. Hepatobiliary: Severe diffuse low attenuation throughout the visualized hepatic parenchyma, indicative of hepatic steatosis. No suspicious cystic or solid hepatic lesions. No intra or extrahepatic biliary ductal dilatation. Gallbladder is normal in appearance. Pancreas: No pancreatic mass. No pancreatic ductal dilatation. No pancreatic or peripancreatic fluid collections or inflammatory changes. Spleen: Unremarkable. Adrenals/Urinary Tract: 1.6 cm low-attenuation lesion in the upper pole the right kidney is compatible with a simple cyst. Other subcentimeter low-attenuation lesions in the right kidney, too small to characterize, but statistically likely to represent tiny cysts. Left kidney and bilateral adrenal glands are normal in appearance. No hydroureteronephrosis. Urinary bladder is normal in appearance. Stomach/Bowel: Normal appearance of the stomach. Tiny duodenal diverticulum extending anteriorly from the third portion of the duodenum. No surrounding inflammatory changes to suggest duodenal diverticulitis. No pathologic dilatation of small bowel or colon. A few scattered colonic diverticulae are noted, without surrounding inflammatory changes to suggest an acute diverticulitis at this time. Vascular/Lymphatic: Aortic atherosclerosis, without evidence of aneurysm or  dissection in the abdominal or pelvic vasculature. No lymphadenopathy noted in the abdomen or pelvis. Reproductive: Status post hysterectomy.  Ovaries are a trophic. Other: No significant volume of ascites.  No pneumoperitoneum. Musculoskeletal: There are no aggressive appearing lytic or blastic lesions noted in the visualized portions of the skeleton. IMPRESSION: 1. No acute findings are noted in the abdomen or pelvis to account for the patient's symptoms. 2. Hepatic steatosis. 3. Colonic diverticulosis without evidence of acute diverticulitis at this time. 4. Aortic atherosclerosis, in addition to at least right coronary artery disease. Assessment for potential risk factor modification, dietary therapy or pharmacologic therapy may be warranted, if clinically indicated. 5. There are calcifications of the mitral valve. Echocardiographic correlation for evaluation of potential valvular dysfunction may be warranted if clinically indicated. 6. Additional incidental findings, as above. Electronically Signed   By: Vinnie Langton M.D.   On: 10/12/2018 10:40    MARCH 2021  The left ventricular ejection fraction is hyperdynamic (>65%).  Nuclear stress EF: 68%.  1 mm ST segment depressions in inferior leads in recovery, returning to baseline 4 minutes into recovery.  This is a overall low risk study.  Defect 1: There is a small defect of mild severity present in the apical anterior, apical septal and apex location.   Small fixed perfusion defect in the apex, anteroapex and apical septum of mild severity with no reversibility. Compared to prior study, a similar distribution is noted however no ischemia is seen on today's study. Wall motion mildly hypokinetic in anteroapex and apical septum.   Findings consistent with infarction.    Assessment & Plan:   Yaneira was seen today for annual exam, hyperlipidemia, gastroesophageal reflux, hypertension and coronary artery disease.  Diagnoses and all  orders for this visit:  Coronary artery disease involving native coronary artery of native heart without angina pectoris- I feel pretty certain that the shortness of breath is not related to CAD.  I think it is multifactorial including new onset anemia, diastolic dysfunction, obesity, and poor conditioning. -     irbesartan (AVAPRO) 150 MG tablet; Take 1 tablet (150 mg total) by mouth daily. -     Lipid panel; Future -     Lipid panel  Essential hypertension- Her blood pressure is not adequately well controlled.  I recommended that she add an ARB to her current regimen. -     irbesartan (AVAPRO) 150 MG tablet; Take 1 tablet (150 mg total) by mouth daily. -  CBC with Differential/Platelet; Future -     Basic metabolic panel; Future -     TSH; Future -     Urinalysis, Routine w reflex microscopic; Future -     Urinalysis, Routine w reflex microscopic -     TSH -     Basic metabolic panel -     CBC with Differential/Platelet  Type II diabetes mellitus with manifestations ([place])- Her A1c is at 6.9%.  Her blood sugar is adequately well controlled. -     HM Diabetes Foot Exam -     Ambulatory referral to Ophthalmology -     irbesartan (AVAPRO) 150 MG tablet; Take 1 tablet (150 mg total) by mouth daily. -     Basic metabolic panel; Future -     Microalbumin / creatinine urine ratio; Future -     Urinalysis, Routine w reflex microscopic; Future -     Hemoglobin A1c; Future -     Hemoglobin A1c -     Urinalysis, Routine w reflex microscopic -     Microalbumin / creatinine urine ratio -     Basic metabolic panel  Vitamin D deficient osteomalacia- Her vitamin D level is normal now. -     VITAMIN D 25 Hydroxy (Vit-D Deficiency, Fractures); Future -     VITAMIN D 25 Hydroxy (Vit-D Deficiency, Fractures)  Hyperlipidemia LDL goal <70- She has achieved her LDL goal and is doing well on the statin. -     Lipid panel; Future -     TSH; Future -     Hepatic function panel; Future -     Hepatic  function panel -     TSH -     Lipid panel  Hypertriglyceridemia- Her triglycerides remain mildly elevated.  In addition to lifestyle modifications I have asked her to take icosapent ethyl for CV risk reduction. -     Lipid panel; Future -     Lipid panel  Routine health maintenance- Exam completed, labs reviewed, vaccines reviewed and updated, cancer screenings are up-to-date, patient education was given.  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  I have asked her to see dermatology. -     Ambulatory referral to Dermatology  Chronic diastolic heart failure ([place])- Her only symptom is DOE.  She has a normal volume status.  Will attempt to get better control of the blood pressure and will continue the current dose of the loop diuretic.  Other orders -     LDL cholesterol, direct   I have discontinued Murvin Natal. Barradas's loratadine, cetirizine, and albuterol. I am also having her start on irbesartan. Additionally, I am having her maintain her Cholecalciferol, azithromycin, potassium chloride, meclizine, simvastatin, aspirin, CALCIUM CITRATE PO, furosemide, fluticasone, omeprazole, and carvedilol.  Meds ordered this encounter  Medications  . irbesartan (AVAPRO) 150 MG tablet    Sig: Take 1 tablet (150 mg total) by mouth daily.    Dispense:  90 tablet    Refill:  1   In addition to time spent on CPE, I spent 60 minutes in preparing to see the patient by review of recent labs, imaging and procedures, obtaining and reviewing separately obtained history, communicating with the patient and family or caregiver, ordering medications, tests or procedures, and documenting clinical information in the EHR including the differential Dx, treatment, and any further evaluation and other management of 1. Coronary artery disease involving native coronary artery of native heart without  angina pectoris 2. Essential hypertension 3. Type II diabetes  mellitus with manifestations ([place]) 4. Vitamin D deficient osteomalacia 5. Hyperlipidemia LDL goal <70 6. Hypertriglyceridemia 7. 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 Chronic diastolic heart failure ([place])    Follow-up: Return in about 6 months (around 12/21/2019).  Scarlette Calico, MD

## 2019-06-21 ENCOUNTER — Encounter: Payer: Self-pay | Admitting: Internal Medicine

## 2019-06-21 LAB — VITAMIN D 25 HYDROXY (VIT D DEFICIENCY, FRACTURES): VITD: 33.01 ng/mL (ref 30.00–100.00)

## 2019-06-21 LAB — TSH: TSH: 5.33 u[IU]/mL — ABNORMAL HIGH (ref 0.35–4.50)

## 2019-06-22 MED ORDER — ICOSAPENT ETHYL 1 G PO CAPS: 2.0000 g | ORAL_CAPSULE | Freq: Two times a day (BID) | ORAL | 1 refills | Status: DC

## 2019-06-27 ENCOUNTER — Encounter: Payer: Self-pay | Admitting: Internal Medicine

## 2019-07-29 ENCOUNTER — Other Ambulatory Visit: Payer: Self-pay | Admitting: Physician Assistant

## 2019-07-29 DIAGNOSIS — E785 Hyperlipidemia, unspecified: Secondary | ICD-10-CM

## 2019-08-03 DIAGNOSIS — R05 Cough: Secondary | ICD-10-CM | POA: Diagnosis not present

## 2019-08-03 DIAGNOSIS — J0100 Acute maxillary sinusitis, unspecified: Secondary | ICD-10-CM | POA: Diagnosis not present

## 2019-08-03 DIAGNOSIS — J040 Acute laryngitis: Secondary | ICD-10-CM | POA: Diagnosis not present

## 2019-08-08 ENCOUNTER — Other Ambulatory Visit: Payer: Self-pay | Admitting: Gastroenterology

## 2019-09-01 ENCOUNTER — Other Ambulatory Visit: Payer: Self-pay | Admitting: Internal Medicine

## 2019-09-01 DIAGNOSIS — J309 Allergic rhinitis, unspecified: Secondary | ICD-10-CM

## 2019-09-01 DIAGNOSIS — Z952 Presence of prosthetic heart valve: Secondary | ICD-10-CM

## 2019-09-01 NOTE — Telephone Encounter (Signed)
Refill Request.  

## 2019-09-12 ENCOUNTER — Other Ambulatory Visit: Payer: Self-pay

## 2019-09-12 ENCOUNTER — Encounter: Payer: Self-pay | Admitting: Internal Medicine

## 2019-09-12 ENCOUNTER — Ambulatory Visit (INDEPENDENT_AMBULATORY_CARE_PROVIDER_SITE_OTHER): Payer: Medicare HMO | Admitting: Internal Medicine

## 2019-09-12 VITALS — BP 148/76 | HR 64 | Temp 97.8°F | Resp 16 | Ht 62.0 in | Wt 173.0 lb

## 2019-09-12 DIAGNOSIS — Z23 Encounter for immunization: Secondary | ICD-10-CM

## 2019-09-12 DIAGNOSIS — E118 Type 2 diabetes mellitus with unspecified complications: Secondary | ICD-10-CM

## 2019-09-12 DIAGNOSIS — D539 Nutritional anemia, unspecified: Secondary | ICD-10-CM | POA: Insufficient documentation

## 2019-09-12 NOTE — Progress Notes (Signed)
Subjective:  Patient ID: Heather Crawford, female    DOB: [birth year]  Age: 74 y.o. MRN: [id number]  CC: Hypertension and Anemia  This visit occurred during the SARS-CoV-2 public health emergency.  Safety protocols were in place, including screening questions prior to the visit, additional usage of staff PPE, and extensive cleaning of exam room while observing appropriate contact time as indicated for disinfecting solutions.    HPI Heather Crawford presents for f/up - She is very active in her garden and denies any recent episodes of headache, blurred vision, chest pain, shortness of breath, palpitations, edema, or fatigue.  She comes in today to recheck her anemia.  She is not aware of any sources of blood loss and she denies paresthesias.  Outpatient Medications Prior to Visit  Medication Sig Dispense Refill  . aspirin 325 MG tablet Take 325 mg by mouth daily.    Marland Kitchen azithromycin (ZITHROMAX) 500 MG tablet Use as directed one (1) hour prior to dental procedures.    Marland Kitchen CALCIUM CITRATE PO Take 1 tablet by mouth daily.    . carvedilol (COREG) 6.25 MG tablet TAKE 1 TABLET TWICE DAILY WITH MEALS 180 tablet 3  . Cholecalciferol 2000 units TABS Take 1 tablet (2,000 Units total) by mouth daily. 90 tablet 1  . fluticasone (FLONASE) 50 MCG/ACT nasal spray USE 1 SPRAY IN EACH NOSTRIL EVERY DAY 32 g 11  . furosemide (LASIX) 20 MG tablet TAKE 1 TABLET DAILY AS NEEDED FOR EDEMA OR WEIGHT GAIN. TAKE POTASSIUM 10MEQ WHEN YOU TAKE LASIX 90 tablet 2  . icosapent Ethyl (VASCEPA) 1 g capsule Take 2 capsules (2 g total) by mouth 2 (two) times daily. 360 capsule 1  . irbesartan (AVAPRO) 150 MG tablet Take 1 tablet (150 mg total) by mouth daily. 90 tablet 1  . meclizine (ANTIVERT) 12.5 MG tablet Take 1 tablet (12.5 mg total) by mouth 3 (three) times daily as needed for dizziness. 90 tablet 3  . omeprazole (PRILOSEC) 40 MG capsule TAKE 1 CAPSULE EVERY DAY 90 capsule 1  . potassium chloride (K-DUR,KLOR-CON) 10 MEQ  tablet Take 1 tablet by mouth on the days you take lasix (furosemide) 90 tablet 3  . simvastatin (ZOCOR) 40 MG tablet TAKE 1 TABLET AT BEDTIME 90 tablet 2   No facility-administered medications prior to visit.    ROS Review of Systems  Constitutional: Negative for appetite change, diaphoresis, fatigue and fever.  HENT: Negative.   Eyes: Negative for visual disturbance.  Respiratory: Negative for cough, chest tightness, shortness of breath and wheezing.   Cardiovascular: Negative for chest pain, palpitations and leg swelling.  Gastrointestinal: Negative for abdominal pain, constipation, diarrhea, nausea and vomiting.  Endocrine: Negative.   Genitourinary: Negative.  Negative for difficulty urinating, dysuria and hematuria.  Musculoskeletal: Negative.  Negative for arthralgias and myalgias.  Skin: Negative.  Negative for color change and pallor.  Neurological: Negative.  Negative for dizziness, weakness, light-headedness, numbness and headaches.  Hematological: Negative for adenopathy. Does not bruise/bleed easily.  Psychiatric/Behavioral: Negative.     Objective:  BP (!) 148/76 (BP Location: Left Arm, Patient Position: Sitting, Cuff Size: Large)   Pulse 64   Temp 97.8 F (36.6 C) (Oral)   Resp 16   Ht 5\' 2"  (1.575 m)   Wt 173 lb (78.5 kg)   SpO2 96%   BMI 31.64 kg/m   BP Readings from Last 3 Encounters:  09/12/19 (!) 148/76  06/20/19 (!) 158/68  05/04/19 132/70    Wt Readings from  Last 3 Encounters:  09/12/19 173 lb (78.5 kg)  06/20/19 176 lb (79.8 kg)  05/04/19 180 lb (81.6 kg)    Physical Exam Vitals reviewed.  Constitutional:      Appearance: She is obese.  HENT:     Nose: Nose normal.     Mouth/Throat:     Mouth: Mucous membranes are moist.  Eyes:     General: No scleral icterus.    Conjunctiva/sclera: Conjunctivae normal.  Cardiovascular:     Rate and Rhythm: Normal rate and regular rhythm.     Heart sounds: S1 normal and S2 normal. Murmur heard.   Systolic murmur is present with a grade of 2/6.  No diastolic murmur is present.  No gallop.   Pulmonary:     Breath sounds: No stridor. No wheezing, rhonchi or rales.  Abdominal:     General: Abdomen is protuberant. Bowel sounds are normal. There is no distension.     Palpations: Abdomen is soft. There is no hepatomegaly, splenomegaly or mass.     Tenderness: There is no abdominal tenderness.  Musculoskeletal:     Cervical back: Neck supple.     Right lower leg: No edema.     Left lower leg: No edema.  Lymphadenopathy:     Cervical: No cervical adenopathy.  Skin:    General: Skin is warm and dry.     Coloration: Skin is not pale.  Neurological:     General: No focal deficit present.     Mental Status: She is alert and oriented to person, place, and time. Mental status is at baseline.  Psychiatric:        Mood and Affect: Mood normal.        Behavior: Behavior normal.     Lab Results  Component Value Date   WBC 6.3 09/12/2019   HGB 12.4 09/12/2019   HCT 37.1 09/12/2019   PLT 226 09/12/2019   GLUCOSE 127 (H) 06/20/2019   CHOL 134 06/20/2019   TRIG 216.0 (H) 06/20/2019   HDL 36.40 (L) 06/20/2019   LDLDIRECT 69.0 06/20/2019   LDLCALC 61 01/06/2018   ALT 20 06/20/2019   AST 20 06/20/2019   NA 139 06/20/2019   K 4.2 06/20/2019   CL 102 06/20/2019   CREATININE 0.68 06/20/2019   BUN 15 06/20/2019   CO2 29 06/20/2019   TSH 5.33 (H) 06/20/2019   INR 3.1 06/18/2010   HGBA1C 7.3 (H) 09/12/2019   MICROALBUR <0.7 06/20/2019    CT Abdomen Pelvis W Contrast  Result Date: 10/12/2018 CLINICAL DATA:  74 year old female with history of right upper quadrant abdominal pain and epigastric pain. Bloating. Intermittent nausea since 09/12/2018. EXAM: CT ABDOMEN AND PELVIS WITH CONTRAST TECHNIQUE: Multidetector CT imaging of the abdomen and pelvis was performed using the standard protocol following bolus administration of intravenous contrast. CONTRAST:  138mL OMNIPAQUE IOHEXOL 300 MG/ML   SOLN COMPARISON:  No priors. FINDINGS: Lower chest: Severe calcifications of the mitral valve. Atherosclerotic calcifications in the descending thoracic aorta as well as the right coronary artery. Hepatobiliary: Severe diffuse low attenuation throughout the visualized hepatic parenchyma, indicative of hepatic steatosis. No suspicious cystic or solid hepatic lesions. No intra or extrahepatic biliary ductal dilatation. Gallbladder is normal in appearance. Pancreas: No pancreatic mass. No pancreatic ductal dilatation. No pancreatic or peripancreatic fluid collections or inflammatory changes. Spleen: Unremarkable. Adrenals/Urinary Tract: 1.6 cm low-attenuation lesion in the upper pole the right kidney is compatible with a simple cyst. Other subcentimeter low-attenuation lesions in the right kidney,  too small to characterize, but statistically likely to represent tiny cysts. Left kidney and bilateral adrenal glands are normal in appearance. No hydroureteronephrosis. Urinary bladder is normal inappearance. Stomach/Bowel: Normal appearance of the stomach. Tiny duodenal diverticulum extending anteriorly from the third portion of the duodenum. No surrounding inflammatory changes to suggest duodenal diverticulitis. No pathologic dilatation of small bowel or colon. A few scattered colonic diverticulae are noted, without surrounding inflammatory changes to suggest an acute diverticulitis at this  time. Vascular/Lymphatic: Aortic atherosclerosis, without evidence of aneurysm or dissection in the abdominal or pelvic vasculature. No lymphadenopathy noted in the abdomen or pelvis. Reproductive: Status post hysterectomy.  Ovaries are a trophic. Other: No significant volume of ascites.  No pneumoperitoneum. Musculoskeletal: There are no aggressive appearing lytic or blastic lesions noted in the visualized portions of the skeleton. IMPRESSION: 1. No acute findings are noted in the abdomen or pelvis to account for the patient's  symptoms. 2. Hepatic steatosis. 3. Colonic diverticulosis without evidence of acute diverticulitis at this time. 4. Aortic atherosclerosis, in addition to at least right coronary artery disease. Assessment for potential risk factor modification, dietary therapy or pharmacologic therapy may be warranted, if clinically indicated. 5. There are calcifications of the mitral valve. Echocardiographic correlation for evaluation of potential valvular dysfunction may be warranted if clinically indicated. 6. Additional incidental findings, as above. Electronically Signed   By: Vinnie Langton M.D.   On: 10/12/2018 10:40    Assessment & Plan:   [place] was seen today for hypertension and anemia.  Diagnoses and all orders for this visit:  Deficiency anemia- She is no longer anemic and her vitamin levels are normal. -     CBC with Differential/Platelet; Future -     Iron; Future -     Vitamin B12; Future -     Reticulocytes; Future -     Ferritin; Future -     Folate; Future -     Vitamin B1; Future -     Vitamin B1 -     Folate -     Ferritin -     Reticulocytes -     Vitamin B12 -     Iron -     CBC with Differential/Platelet  Type II diabetes mellitus with manifestations ([place])- Her recent A1c was 7.3%.  I recommended that she start taking an SGLT2 inhibitor. -     Hemoglobin A1c; Future -     Hemoglobin A1c -     dapagliflozin propanediol (FARXIGA) 10 MG TABS tablet; Take 1 tablet (10 mg total) by mouth daily before breakfast.  Need for pneumococcal vaccination -     Pneumococcal polysaccharide vaccine 23-valent greater than or equal to 2yo subcutaneous/IM   I am having Murvin Natal. Grisby start on dapagliflozin propanediol. I am also having her maintain her Cholecalciferol, azithromycin, potassium chloride, meclizine, aspirin, CALCIUM CITRATE PO, carvedilol, irbesartan, icosapent Ethyl, simvastatin, omeprazole, furosemide, and fluticasone.  Meds ordered this encounter  Medications  .  dapagliflozin propanediol (FARXIGA) 10 MG TABS tablet    Sig: Take 1 tablet (10 mg total) by mouth daily before breakfast.    Dispense:  90 tablet    Refill:  1     Follow-up: Return in about 4 months (around 01/12/2020).  Scarlette Calico, MD

## 2019-09-12 NOTE — Patient Instructions (Signed)
Anemia  Anemia is a condition in which you do not have enough red blood cells or hemoglobin. Hemoglobin is a substance in red blood cells that carries oxygen. When you do not have enough red blood cells or hemoglobin (are anemic), your body cannot get enough oxygen and your organs may not work properly. As a result, you may feel very tired or have other problems. What are the causes? Common causes of anemia include:  Excessive bleeding. Anemia can be caused by excessive bleeding inside or outside the body, including bleeding from the intestine or from periods in women.  Poor nutrition.  Long-lasting (chronic) kidney, thyroid, and liver disease.  Bone marrow disorders.  Cancer and treatments for cancer.  HIV (human immunodeficiency virus) and AIDS (acquired immunodeficiency syndrome).  Treatments for HIV and AIDS.  Spleen problems.  Blood disorders.  Infections, medicines, and autoimmune disorders that destroy red blood cells. What are the signs or symptoms? Symptoms of this condition include:  Minor weakness.  Dizziness.  Headache.  Feeling heartbeats that are irregular or faster than normal (palpitations).  Shortness of breath, especially with exercise.  Paleness.  Cold sensitivity.  Indigestion.  Nausea.  Difficulty sleeping.  Difficulty concentrating. Symptoms may occur suddenly or develop slowly. If your anemia is mild, you may not have symptoms. How is this diagnosed? This condition is diagnosed based on:  Blood tests.  Your medical history.  A physical exam.  Bone marrow biopsy. Your health care provider may also check your stool (feces) for blood and may do additional testing to look for the cause of your bleeding. You may also have other tests, including:  Imaging tests, such as a CT scan or MRI.  Endoscopy.  Colonoscopy. How is this treated? Treatment for this condition depends on the cause. If you continue to lose a lot of blood, you may  need to be treated at a hospital. Treatment may include:  Taking supplements of iron, vitamin S31, or folic acid.  Taking a hormone medicine (erythropoietin) that can help to stimulate red blood cell growth.  Having a blood transfusion. This may be needed if you lose a lot of blood.  Making changes to your diet.  Having surgery to remove your spleen. Follow these instructions at home:  Take over-the-counter and prescription medicines only as told by your health care provider.  Take supplements only as told by your health care provider.  Follow any diet instructions that you were given.  Keep all follow-up visits as told by your health care provider. This is important. Contact a health care provider if:  You develop new bleeding anywhere in the body. Get help right away if:  You are very weak.  You are short of breath.  You have pain in your abdomen or chest.  You are dizzy or feel faint.  You have trouble concentrating.  You have bloody or black, tarry stools.  You vomit repeatedly or you vomit up blood. Summary  Anemia is a condition in which you do not have enough red blood cells or enough of a substance in your red blood cells that carries oxygen (hemoglobin).  Symptoms may occur suddenly or develop slowly.  If your anemia is mild, you may not have symptoms.  This condition is diagnosed with blood tests as well as a medical history and physical exam. Other tests may be needed.  Treatment for this condition depends on the cause of the anemia. This information is not intended to replace advice given to you by  your health care provider. Make sure you discuss any questions you have with your health care provider. Document Revised: 01/01/2017 Document Reviewed: 02/21/2016 Elsevier Patient Education  [place].

## 2019-09-15 ENCOUNTER — Encounter: Payer: Self-pay | Admitting: Physician Assistant

## 2019-09-15 ENCOUNTER — Ambulatory Visit: Payer: Medicare HMO | Admitting: Physician Assistant

## 2019-09-15 ENCOUNTER — Other Ambulatory Visit: Payer: Self-pay

## 2019-09-15 DIAGNOSIS — L570 Actinic keratosis: Secondary | ICD-10-CM

## 2019-09-15 DIAGNOSIS — L814 Other melanin hyperpigmentation: Secondary | ICD-10-CM

## 2019-09-15 DIAGNOSIS — D229 Melanocytic nevi, unspecified: Secondary | ICD-10-CM | POA: Diagnosis not present

## 2019-09-15 DIAGNOSIS — L820 Inflamed seborrheic keratosis: Secondary | ICD-10-CM | POA: Diagnosis not present

## 2019-09-15 DIAGNOSIS — L821 Other seborrheic keratosis: Secondary | ICD-10-CM | POA: Diagnosis not present

## 2019-09-15 DIAGNOSIS — D1800 Hemangioma unspecified site: Secondary | ICD-10-CM | POA: Diagnosis not present

## 2019-09-15 DIAGNOSIS — Z85828 Personal history of other malignant neoplasm of skin: Secondary | ICD-10-CM

## 2019-09-15 DIAGNOSIS — D0439 Carcinoma in situ of skin of other parts of face: Secondary | ICD-10-CM | POA: Diagnosis not present

## 2019-09-15 DIAGNOSIS — D485 Neoplasm of uncertain behavior of skin: Secondary | ICD-10-CM | POA: Diagnosis not present

## 2019-09-15 DIAGNOSIS — Z1283 Encounter for screening for malignant neoplasm of skin: Secondary | ICD-10-CM

## 2019-09-15 DIAGNOSIS — L578 Other skin changes due to chronic exposure to nonionizing radiation: Secondary | ICD-10-CM

## 2019-09-15 LAB — CBC WITH DIFFERENTIAL/PLATELET
Absolute Monocytes: 498 cells/uL (ref 200–950)
Basophils Absolute: 69 cells/uL (ref 0–200)
Basophils Relative: 1.1 %
Eosinophils Absolute: 107 cells/uL (ref 15–500)
Eosinophils Relative: 1.7 %
HCT: 37.1 % (ref 35.0–45.0)
Hemoglobin: 12.4 g/dL (ref 11.7–15.5)
Lymphs Abs: 2117 cells/uL (ref 850–3900)
MCH: 30.5 pg (ref 27.0–33.0)
MCHC: 33.4 g/dL (ref 32.0–36.0)
MCV: 91.2 fL (ref 80.0–100.0)
MPV: 10.1 fL (ref 7.5–12.5)
Monocytes Relative: 7.9 %
Neutro Abs: 3509 cells/uL (ref 1500–7800)
Neutrophils Relative %: 55.7 %
Platelets: 226 10*3/uL (ref 140–400)
RBC: 4.07 10*6/uL (ref 3.80–5.10)
RDW: 13 % (ref 11.0–15.0)
Total Lymphocyte: 33.6 %
WBC: 6.3 10*3/uL (ref 3.8–10.8)

## 2019-09-15 LAB — IRON: Iron: 56 ug/dL (ref 45–160)

## 2019-09-15 LAB — HEMOGLOBIN A1C
Hgb A1c MFr Bld: 7.3 % of total Hgb — ABNORMAL HIGH (ref <5.7)
Mean Plasma Glucose: 163 (calc)
eAG (mmol/L): 9 (calc)

## 2019-09-15 LAB — RETICULOCYTES
ABS Retic: 89540 cells/uL — ABNORMAL HIGH (ref 20000–8000)
Retic Ct Pct: 2.2 %

## 2019-09-15 LAB — VITAMIN B12: Vitamin B-12: 353 pg/mL (ref 200–1100)

## 2019-09-15 LAB — FERRITIN: Ferritin: 88 ng/mL (ref 16–288)

## 2019-09-15 LAB — FOLATE: Folate: 20.7 ng/mL

## 2019-09-15 LAB — VITAMIN B1: Vitamin B1 (Thiamine): 44 nmol/L — ABNORMAL HIGH (ref 8–30)

## 2019-09-15 MED ORDER — DAPAGLIFLOZIN PROPANEDIOL 10 MG PO TABS: 10.0000 mg | ORAL_TABLET | Freq: Every day | ORAL | 1 refills | Status: DC

## 2019-09-15 NOTE — Progress Notes (Addendum)
New Patient   Subjective  Heather Crawford is a 74 y.o. female who presents for the following: Annual Exam (new patient skin check, history of squamous cell on face).    The following portions of the chart were reviewed this encounter and updated as appropriate: Tobacco  Allergies  Meds  Problems  Med Hx  Surg Hx  Fam Hx      Objective  Well appearing patient in no apparent distress; mood and affect are within normal limits.  A full examination was performed including scalp, head, eyes, ears, nose, lips, neck, chest, axillae, abdomen, back, buttocks, bilateral upper extremities, bilateral lower extremities, hands, feet, fingers, toes, fingernails, and toenails. All findings within normal limits unless otherwise noted below.  Objective  Head - to toe: No atypical nevi   Objective  Right Nasal Sidewall: Peduculated skin tag      Objective  Left Eyebrow, Left Forehead, Left Parotid Area, Left Thigh - Anterior (2), Right Eyebrow, Right Zygomatic Area: Erythematous patches with gritty scale.  Objective  Dorsum of Nose: Scar clear  Right Buccal Cheek   Objective  Right Buccal Cheek : Hyperkeratotic scale with pink base   Images    Assessment & Plan  Skin exam for malignant neoplasm Head - to toe  Yearly skin exam  Neoplasm of uncertain behavior of skin Right Nasal Sidewall  Epidermal / dermal shaving  Lesion diameter (cm):  1.2 Informed consent: discussed and consent obtained   Timeout: patient name, date of birth, surgical site, and procedure verified   Procedure prep:  Patient was prepped and draped in usual sterile fashion Prep type:  Chlorhexidine Anesthesia: the lesion was anesthetized in a standard fashion   Anesthetic:  1% lidocaine w/ epinephrine 1-100,000 local infiltration Instrument used: flexible razor blade   Hemostasis achieved with: aluminum chloride   Outcome: patient tolerated procedure well   Post-procedure details: sterile  dressing applied and wound care instructions given   Dressing type: petrolatum gauze, petrolatum and bandage    Specimen 2 - Surgical pathology Differential Diagnosis: irritated skin tag Check Margins: yes  Actinic keratosis (7) Left Thigh - Anterior (2); Left Eyebrow; Right Eyebrow; Right Zygomatic Area; Left Parotid Area; Left Forehead  Destruction of lesion - Left Eyebrow, Left Forehead, Left Parotid Area, Left Thigh - Anterior (2), Right Eyebrow, Right Zygomatic Area Complexity: simple   Destruction method: cryotherapy   Informed consent: discussed and consent obtained   Timeout:  patient name, date of birth, surgical site, and procedure verified Lesion destroyed using liquid nitrogen: Yes   Cryotherapy cycles:  1 Outcome: patient tolerated procedure well with no complications   Post-procedure details: wound care instructions given    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   Skin / nail biopsy - Right Buccal Cheek  Type of biopsy: tangential   Informed consent: discussed and consent obtained   Timeout: patient name, date of birth, surgical site, and procedure verified   Anesthesia: the lesion was anesthetized in a standard fashion   Anesthetic:  1% lidocaine w/ epinephrine 1-100,000 local infiltration Instrument used: flexible razor blade   Hemostasis achieved with: aluminum chloride and electrodesiccation   Outcome: patient tolerated procedure well   Post-procedure details: wound care instructions given    Destruction of lesion - Right Buccal Cheek  Complexity: simple   Destruction method: electrodesiccation and curettage   Informed consent: discussed and  consent obtained   Timeout:  patient name, date of birth, surgical site, and procedure verified Anesthesia: the lesion was anesthetized in a standard fashion   Anesthetic:  1% lidocaine w/ epinephrine 1-100,000 local  infiltration Curettage performed in three different directions: Yes   Electrodesiccation performed over the curetted area: Yes   Curettage cycles:  3 Lesion length (cm):  1 Lesion width (cm):  1 Margin per side (cm):  0.1 Final wound size (cm):  1.2 Hemostasis achieved with:  aluminum chloride Outcome: patient tolerated procedure well with no complications   Post-procedure details: wound care instructions given    Specimen 1 - Surgical pathology Differential Diagnosis: SCC Check Margins: yes CX3 Lentigines - Scattered tan macules - Discussed due to sun exposure - Benign, observe - Call for any changes  Seborrheic Keratoses - Stuck-on, waxy, tan-brown papules and plaques  - Discussed benign etiology and prognosis. - Observe - Call for any changes  Melanocytic Nevi - Tan-brown and/or pink-flesh-colored symmetric macules and papules - Benign appearing on exam today - Observation - Call clinic for new or changing moles - Recommend daily use of broad spectrum spf 30+ sunscreen to sun-exposed areas.   Hemangiomas - Red papules - Discussed benign nature - Observe - Call for any changes  Actinic Damage - diffuse scaly erythematous macules with underlying dyspigmentation - Recommend daily broad spectrum sunscreen SPF 30+ to sun-exposed areas, reapply every 2 hours as needed.  - Call for new or changing lesions.  Skin cancer screening performed today. Heather Crawford, Heather Dedrick, PA-C, have reviewed all documentation for this visit. The documentation on 10/02/19 for the exam, diagnosis, procedures, and orders are all accurate and complete.

## 2019-09-15 NOTE — Patient Instructions (Signed)
Biopsy, Surgery (Curettage) & Surgery (Excision) Aftercare Instructions  1. Okay to remove bandage in 24 hours  2. Wash area with soap and water  3. Apply Vaseline to area twice daily until healed (Not Neosporin)  4. Okay to cover with a Band-Aid to decrease the chance of infection or prevent irritation from clothing; also it's okay to uncover lesion at home.  5. Suture instructions: return to our office in 7-10 or 10-14 days for a nurse visit for suture removal. Variable healing with sutures, if pain or itching occurs call our office. It's okay to shower or bathe 24 hours after sutures are given.  6. The following risks may occur after a biopsy, curettage or excision: bleeding, scarring, discoloration, recurrence, infection (redness, yellow drainage, pain or swelling).  7. If your results are positive we will contact you, if you do not hear from [place] review your MyChart.  Stay Well

## 2019-09-25 ENCOUNTER — Telehealth: Payer: Self-pay | Admitting: *Deleted

## 2019-09-25 NOTE — Telephone Encounter (Signed)
-----   Message from Warren Danes, [place] sent at 09/20/2019 10:00 AM EDT -----  CIS- R cheek---RTC if recurs. Yearly skin exams

## 2019-09-25 NOTE — Telephone Encounter (Signed)
Path to patient  

## 2019-10-16 ENCOUNTER — Telehealth: Payer: Medicare HMO | Admitting: Physician Assistant

## 2019-10-23 ENCOUNTER — Other Ambulatory Visit: Payer: Self-pay | Admitting: Internal Medicine

## 2019-10-23 DIAGNOSIS — I10 Essential (primary) hypertension: Secondary | ICD-10-CM

## 2019-10-23 DIAGNOSIS — E118 Type 2 diabetes mellitus with unspecified complications: Secondary | ICD-10-CM

## 2019-10-23 DIAGNOSIS — I2510 Atherosclerotic heart disease of native coronary artery without angina pectoris: Secondary | ICD-10-CM

## 2019-11-07 ENCOUNTER — Encounter: Payer: Self-pay | Admitting: Physician Assistant

## 2019-11-07 ENCOUNTER — Other Ambulatory Visit: Payer: Self-pay

## 2019-11-07 ENCOUNTER — Ambulatory Visit: Payer: Medicare HMO | Admitting: Physician Assistant

## 2019-11-07 VITALS — BP 160/70 | HR 62 | Ht 62.0 in | Wt 174.0 lb

## 2019-11-07 DIAGNOSIS — Z952 Presence of prosthetic heart valve: Secondary | ICD-10-CM

## 2019-11-07 DIAGNOSIS — I10 Essential (primary) hypertension: Secondary | ICD-10-CM | POA: Diagnosis not present

## 2019-11-07 DIAGNOSIS — R55 Syncope and collapse: Secondary | ICD-10-CM | POA: Diagnosis not present

## 2019-11-07 DIAGNOSIS — I5032 Chronic diastolic (congestive) heart failure: Secondary | ICD-10-CM

## 2019-11-07 DIAGNOSIS — I060 Rheumatic aortic stenosis: Secondary | ICD-10-CM

## 2019-11-07 DIAGNOSIS — I25119 Atherosclerotic heart disease of native coronary artery with unspecified angina pectoris: Secondary | ICD-10-CM

## 2019-11-07 DIAGNOSIS — E785 Hyperlipidemia, unspecified: Secondary | ICD-10-CM

## 2019-11-07 DIAGNOSIS — I779 Disorder of arteries and arterioles, unspecified: Secondary | ICD-10-CM | POA: Diagnosis not present

## 2019-11-07 NOTE — Patient Instructions (Addendum)
Medication Instructions:  Your physician recommends that you continue on your current medications as directed. Please refer to the Current Medication list given to you today.  *If you need a refill on your cardiac medications before your next appointment, please call your pharmacy*  Lab Work: None ordered today  Testing/Procedures: Your physician has requested that you have a carotid duplex. This test is an ultrasound of the carotid arteries in your neck. It looks at blood flow through these arteries that supply the brain with blood. Allow one hour for this exam. There are no restrictions or special instructions.  Your physician has recommended that you wear an event monitor. Event monitors are medical devices that record the heart's electrical activity. Doctors most often [place] these monitors to diagnose arrhythmias. Arrhythmias are problems with the speed or rhythm of the heartbeat. The monitor is a small, portable device. You can wear one while you do your normal daily activities. This is usually used to diagnose what is causing palpitations/syncope (passing out).  Follow-Up: On 02/07/2020 at 8:15AM with Richardson Dopp, PA-C  Other Instructions Check blood pressure 1-2 times a day and send readings through my chart or call readings in

## 2019-11-07 NOTE — Progress Notes (Signed)
Cardiology Office Note:    Date:  11/07/2019   ID:  Heather Crawford, DOB Nov 17, 1945, MRN [id number]  PCP:  Janith Lima, MD  [facility] Cardiologist:  Sherren Mocha, MD   [facility] Electrophysiologist:  None   Referring MD: Janith Lima, MD   Chief Complaint:  Follow-up (CAD, valvular heart disease)    Patient Profile:    Heather Crawford is a 74 y.o. female with:   Valvular heart disease, Rheumatic Fever as a child  Aortic stenosis, s/p bioprosthetic AVR in 2012  Mitral regurgitation  Echo 3/21: Mild-moderate MR  Coronary artery disease   S/p CABG x 1 in 2012 (RIMA-RCA)  Diastolic CHF  Echo 7/82: EF 78-70, GRII DD  Carotid artery disease  LBBB  Vertigo 2/2 labyrinthitis   GERD  Impaired glucose tolerance   Prior CV studies: Myoview 05/03/2019 EF 68, small fixed apical defect, no ischemia, low risk  Echocardiogram 05/03/2019 EF 16-70, no RWMA, GRII DD, normal RVSF, RVSP 28.8, mild-moderate MR, s/p AVR -normally functioning, no PVL  Echocardiogram 10/14/2016 Mild LVH, EF 60-65, Normally functioning bioprosthetic aortic valve replacement (mean 14, peak 26), mild MR, moderate LAE  Event monitor 07/21/2016 Sinus rhythm, average heart rate 69, no significant arrhythmia; intermittent IVCD  Echocardiogram 10/10/2015 EF 60-65, normal wall motion, grade 1 diastolic dysfunction, normally functioning bioprosthetic aortic valve replacement, mild MR, normal RV SF, PASP 27  Carotid US 10/20/2015 Bilateral ICA 1-39  Cardiac catheterization 04/03/2010 EF 55 RCA ostial 40-50, distal 90-95 LM normal LCx normal LAD mid 30 >> CABG   History of Present Illness:    [name] was last seen by Dr. Burt Knack in 3/21.  She had symptoms consistent with CCS class II angina.  A nuclear stress test was low risk without significant ischemia.  An echocardiogram demonstrated normal LV function, moderate diastolic dysfunction and normally functioning AVR.  There  was mild-moderate mitral regurgitation.  At her appointment in 3/21, the patient declined long-acting nitrates.  She returns for routine follow-up.    She is here alone.  She notes episodes of dizziness and near syncope over the past several years.  She states that she feels like she is "dying" when this happens.  She also thinks that she is having a heart attack when this happens.  She gets diaphoretic but does not have chest pain.  She describes a trip to the emergency room in the past and says her troponin levels were elevated.  I reviewed her chart in the room with her.  I do not see any elevated troponin levels in her chart.  She notes an episode of syncope.  This was back in 2018.  She was evaluated by Dr. Saunders Revel at that time and a cardiac monitor did not demonstrate any arrhythmias.  She does have a history of chronic vertigo.  She has not had chest pain.  She has shortness of breath with mild to moderate activities.  This is largely unchanged.  She sleeps on one pillow.  She does not have significant leg swelling.  Her last episode of near syncope was this past weekend.  She notes that this was the worst has been quite some time.   Past Medical History:  Diagnosis Date  . Allergic rhinitis, cause unspecified   . Aortic stenosis    a.  echo 1/12 w/mildly dilated LV, EF 45-50% w/paradoxical septal motion consistent w/ LBBB, moderate aortic stenosis w/mean gradient 27 mmHg, trivial AI, mild to moderate MR w/calcified mitral  valve;   b. s/p AVR with 21 mm Magna Ease valve  . Aortic valve replaced 2012  . Arthritis    joint pain  . CAD (coronary artery disease)    a. s/p CABG 4/12: RIMA-RCA  . CAD, multiple vessel    sees Dr Aundra Dubin every 3-4 months  . Cancer ([place])    skin sarcomas removed  . CHF (congestive heart failure) (HCC)    chronic diastolic CHF  . Chronic back pain    H/O BACK SURGERY  . Coronary artery disease    adenosine myoview 2/12 w/EF 56%, ISCHEMIA W/SCAR IN THE MID TO APICAL  ANTERIO WALL, SEPTAL WALL, AND APEX. LHC 3/12 W/EF 55%, 40% OSTIAL RCA, 60% MID RCA, 95% DISTAL RC A, INTERVENTION  ATTEMPTED BUT UNABLE TO ADEQUADATELY SEAT  CATHETER.  . Diverticulitis of colon (without mention of hemorrhage)(562.11)   . Diverticulosis   . Esophageal reflux   . Glucose intolerance (impaired glucose tolerance)    A1C 6.3 (4/12)  . H/O hiatal hernia   . H/O vertigo    with fluid  in ears  . Heart murmur   . Hemorrhoids   . Hemorrhoids   . History of rheumatic fever   . Hx of hysterectomy   . Hx: UTI (urinary tract infection)   . Hyperlipidemia   . LBBB (left bundle branch block)   . Myocardial infarction ([place]) 2002  . Pneumonia   . PONV (postoperative nausea and vomiting)   . 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 Snoring   . Squamous cell carcinoma of skin    nose  . Urinary frequency     Current Medications: Current Meds  Medication Sig  . aspirin 325 MG tablet Take 325 mg by mouth daily.  Marland Kitchen azithromycin (ZITHROMAX) 500 MG tablet Use as directed one (1) hour prior to dental procedures.  Marland Kitchen CALCIUM CITRATE PO Take 1 tablet by mouth daily.  . carvedilol (COREG) 6.25 MG tablet TAKE 1 TABLET TWICE DAILY WITH MEALS  . Cholecalciferol 2000 units TABS Take 1 tablet (2,000 Units total) by mouth daily.  . dapagliflozin propanediol (FARXIGA) 10 MG TABS tablet Take 1 tablet (10 mg total) by mouth daily before breakfast.  . fluticasone (FLONASE) 50 MCG/ACT nasal spray USE 1 SPRAY IN EACH NOSTRIL EVERY DAY  . furosemide (LASIX) 20 MG tablet TAKE 1 TABLET DAILY AS NEEDED FOR EDEMA OR WEIGHT GAIN. TAKE POTASSIUM 10MEQ WHEN YOU TAKE LASIX  . icosapent Ethyl (VASCEPA) 1 g capsule Take 2 capsules (2 g total) by mouth 2 (two) times daily.  . irbesartan (AVAPRO) 150 MG tablet TAKE 1 TABLET EVERY DAY  . meclizine (ANTIVERT) 12.5 MG tablet Take 1 tablet (12.5 mg total) by mouth 3 (three) times daily as needed for dizziness.  Marland Kitchen omeprazole (PRILOSEC) 40 MG capsule TAKE 1 CAPSULE EVERY DAY   . potassium chloride (K-DUR,KLOR-CON) 10 MEQ tablet Take 1 tablet by mouth on the days you take lasix (furosemide)  . simvastatin (ZOCOR) 40 MG tablet TAKE 1 TABLET AT BEDTIME     Allergies:   Amoxicillin, Crestor [rosuvastatin], Lipitor [atorvastatin], Neomycin, Sulfamethoxazole-trimethoprim, Tape, Neosporin [neomycin-bacitracin zn-polymyx], and Polysporin [bacitracin-polymyxin b]   Social History   Tobacco Use  . Smoking status: Former Smoker    Packs/day: 1.00    Years: 40.00    Pack years: 40.00    Types: Cigarettes    Quit date: 02/03/2000    Years since quitting: 19.7  . Smokeless tobacco: Former Systems developer  Types: Chew  . Tobacco comment: discussed LDCT   Vaping Use  . Vaping Use: Never used  Substance Use Topics  . Alcohol use: No    Alcohol/week: 0.0 standard drinks  . Drug use: No     Family Hx: The patient's family history includes Breast cancer in her maternal aunt, mother, paternal grandmother, and sister; CAD in her father; Colon cancer in her maternal aunt and paternal uncle; Diabetes type II in her mother; Heart attack in her father; Heart disease in her mother; Hypertension in her mother; Hypothyroidism in her mother; Pancreatic cancer in an other family member. There is no history of Stomach cancer, Throat cancer, or Esophageal cancer.  ROS   EKGs/Labs/Other Test Reviewed:    EKG:  EKG is   ordered today.  The ekg ordered today demonstrates normal sinus rhythm, heart rate 62, left bundle branch block, similar to prior tracings  Recent Labs: 06/20/2019: ALT 20; BUN 15; Creatinine, Ser 0.68; Potassium 4.2; Sodium 139; TSH 5.33 09/12/2019: Hemoglobin 12.4; Platelets 226   Recent Lipid Panel Lab Results  Component Value Date/Time   CHOL 134 06/20/2019 09:27 AM   CHOL 150 01/06/2018 11:13 AM   TRIG 216.0 (H) 06/20/2019 09:27 AM   HDL 36.40 (L) 06/20/2019 09:27 AM   HDL 42 01/06/2018 11:13 AM   CHOLHDL 4 06/20/2019 09:27 AM   LDLCALC 61 01/06/2018 11:13 AM    LDLDIRECT 69.0 06/20/2019 09:27 AM    Physical Exam:    VS:  BP (!) 160/70   Pulse 62   Ht 5\' 2"  (1.575 m)   Wt 174 lb (78.9 kg)   SpO2 95%   BMI 31.83 kg/m     Wt Readings from Last 3 Encounters:  11/07/19 174 lb (78.9 kg)  09/12/19 173 lb (78.5 kg)  06/20/19 176 lb (79.8 kg)     Constitutional:      Appearance: Healthy appearance. Not in distress.  Neck:     Vascular: Carotid bruit (vs radiating systolic murmur) present.  Pulmonary:     Effort: Pulmonary effort is normal.     Breath sounds: No wheezing. No rales.  Cardiovascular:     Normal rate. Regular rhythm. Normal S1. Normal S2.     Murmurs: There is a grade 1/6 systolic murmur at the URSB.  Edema:    Peripheral edema absent.  Abdominal:     General: There is no distension.     Palpations: Abdomen is soft.  Musculoskeletal:     Cervical back: Neck supple. Skin:    General: Skin is warm and dry.  Neurological:     Mental Status: Alert and oriented to person, place and time.     Cranial Nerves: Cranial nerves are intact.       ASSESSMENT & PLAN:    1. Near syncope She has long history of dizziness and near syncope.  I suspect that most of this is related to vertigo.  She did have an episode of syncope back in 2018.  Her work-up was negative at that time.  Recent echocardiogram demonstrated normal LV function.  She has not had true syncope for several years.  I did check her blood pressure sitting and standing today.  There was no change.  She does have a left bundle branch block on electrocardiogram.  I have recommended proceeding with a 30-day event monitor to rule out arrhythmia as a cause for her symptoms.  She is also due for follow-up carotid Dopplers.  If testing does not  demonstrate any significant abnormality, I have recommended that we refer her to neurology for further evaluation.  She may ultimately need vestibular rehabilitation for her vertigo.  2. Coronary artery disease involving native coronary  artery of native heart with angina pectoris ([place]) History of CABG x1 in 2012.  Recent Myoview low risk.  She does have exertional jaw pain.  We discussed starting on low-dose nitrate therapy.  She would like to hold off on this for now.  Continue aspirin, simvastatin carvedilol.  She knows to contact me if she changes her mind about long acting nitrates.    3. Rheumatic aortic stenosis 4. S/P AVR (aortic valve replacement) Normally functioning AVR by echocardiogram in 3/21.  Continue SBE prophylaxis.  5. Chronic diastolic heart failure (HCC) Volume status currently stable.  Continue current management.  6. Essential (primary) hypertension Blood pressures in the office are typically elevated.  She notes normal blood pressures at home.  She also notes these are sometimes low.  I have asked her to monitor blood pressure and send [place] those readings for review.  If she has several low readings, consider reducing her hypertensive regimen.    7. Bilateral carotid artery disease, unspecified type ([place]) Arrange follow-up carotid Dopplers.  8. Hyperlipidemia with target LDL less than 70 LDL optimal on most recent lab work.  Continue current Rx.       Dispo:  Return in about 3 months (around 02/07/2020) for Routine Follow Up, w/ Dr. Burt Knack, or Richardson Dopp, PA-C, in person.   Medication Adjustments/Labs and Tests Ordered: Current medicines are reviewed at length with the patient today.  Concerns regarding medicines are outlined above.  Tests Ordered: Orders Placed This Encounter  Procedures  . CARDIAC EVENT MONITOR  . EKG 12-Lead  . VAS US CAROTID   Medication Changes: No orders of the defined types were placed in this encounter.   Signed, Richardson Dopp, PA-C  11/07/2019 5:11 PM    [facility] [place], [place],   [address] Phone: [phone]; Fax: [phone]

## 2019-11-15 ENCOUNTER — Encounter: Payer: Self-pay | Admitting: Cardiovascular Disease

## 2019-11-15 ENCOUNTER — Encounter (INDEPENDENT_AMBULATORY_CARE_PROVIDER_SITE_OTHER): Payer: Medicare HMO

## 2019-11-15 DIAGNOSIS — R55 Syncope and collapse: Secondary | ICD-10-CM | POA: Diagnosis not present

## 2019-11-16 ENCOUNTER — Other Ambulatory Visit (HOSPITAL_COMMUNITY): Payer: Self-pay | Admitting: Physician Assistant

## 2019-11-16 ENCOUNTER — Other Ambulatory Visit: Payer: Self-pay

## 2019-11-16 ENCOUNTER — Ambulatory Visit (HOSPITAL_COMMUNITY)
Admission: RE | Admit: 2019-11-16 | Discharge: 2019-11-16 | Disposition: A | Discharge status: Home / Self Care | Payer: Medicare HMO | Source: Ambulatory Visit | Attending: Internal Medicine | Admitting: Internal Medicine

## 2019-11-16 DIAGNOSIS — I779 Disorder of arteries and arterioles, unspecified: Secondary | ICD-10-CM | POA: Diagnosis not present

## 2019-11-16 DIAGNOSIS — R55 Syncope and collapse: Secondary | ICD-10-CM | POA: Diagnosis not present

## 2019-11-16 DIAGNOSIS — I6523 Occlusion and stenosis of bilateral carotid arteries: Secondary | ICD-10-CM

## 2019-11-16 DIAGNOSIS — R42 Dizziness and giddiness: Secondary | ICD-10-CM | POA: Diagnosis not present

## 2019-11-17 ENCOUNTER — Encounter: Payer: Self-pay | Admitting: Physician Assistant

## 2019-11-20 ENCOUNTER — Telehealth: Payer: Self-pay | Admitting: *Deleted

## 2019-11-20 DIAGNOSIS — I779 Disorder of arteries and arterioles, unspecified: Secondary | ICD-10-CM

## 2019-11-20 NOTE — Telephone Encounter (Signed)
-----   Message from Nuala Alpha, LPN sent at 41/95/4248 11:45 AM EDT -----  ----- Message ----- From: Liliane Shi, PA-C Sent: 11/17/2019   2:01 PM EDT To: [address] Triage  Please call patient The carotid US shows mild bilat ICA stenosis.  There is some blockage in the L subclavian artery.   PLAN:  - Continue current medications - Ask patient if she has any dizziness associated with using her L arm.  - Send copy to PCP - Repeat in 1 year   Richardson Dopp, PA-C    11/17/2019 1:56 PM

## 2019-11-23 ENCOUNTER — Telehealth: Payer: Self-pay | Admitting: Cardiology

## 2019-11-23 NOTE — Telephone Encounter (Signed)
Pt contacted and instructed to hold coreg until further directed by Dr. Burt Knack or Richardson Dopp, PA

## 2019-11-23 NOTE — Telephone Encounter (Signed)
Notified by [employer] re: significant event. 4-second sinus pause noted around 8pm ET tonight. Pt contacted and stated she felt "lightheaded" for a few moments but did not pass out.  Event preceded by NSR in the 70s, and NSR in 70s resumed following the pause.

## 2019-11-24 ENCOUNTER — Telehealth: Payer: Self-pay

## 2019-11-24 ENCOUNTER — Telehealth: Payer: Self-pay | Admitting: *Deleted

## 2019-11-24 NOTE — Telephone Encounter (Signed)
Received a [employer] fax for a critical event.   Day 9 of 30. 4 second pause recordered on 11/23/2019 at 06:54 pm.   On call MD addressed and made changes to treatment plan yesterday, patient aware.  Ordering MD Burt Knack agreed with plan. (please see phone note from yesterday)  DOD to sign Dr. Burt Knack

## 2019-11-24 NOTE — Addendum Note (Signed)
Addended by: Harland German A on: 11/24/2019 12:14 PM   Modules accepted: Orders

## 2019-11-24 NOTE — Telephone Encounter (Signed)
Change Irbesartan to 75 mg q 12 hours (twice daily). She can break the 150 mg tabs in 1/2. Monitor BP.  Call if consistently > 130/80. Richardson Dopp, PA-C    11/24/2019 1:35 PM

## 2019-11-24 NOTE — Telephone Encounter (Signed)
I called and spoke with patient, she is aware to start cutting Irbesartan in half, she will take 0.5 tablet (75 mg) in the morning and the other half tablet 12 hours later. Patient will continue to monitor blood pressure and call if blood pressure is consistently 130/80 or higher.

## 2019-11-24 NOTE — Telephone Encounter (Signed)
Reiterated to the patient to [place]. She will continue to wear monitor and all if she has any problems.  Coreg 6.25 mg BID removed from med list.

## 2019-11-24 NOTE — Telephone Encounter (Signed)
Agree. Hold carvedilol and continue to monitor. thanks

## 2019-12-05 ENCOUNTER — Telehealth: Payer: Self-pay | Admitting: Physician Assistant

## 2019-12-05 NOTE — Telephone Encounter (Signed)
Heather Crawford reports she felt a little dizzy this AM. She took her BP and it was 160/92 and HR 102. This was only minutes after taking her morning medications. She drank coffee with caffeine this AM (she usually drinks decaf) and she has felt "jittery inside" ever since. She checked her BP again a couple hours later and it was 153/93 and HR 101. She doesn't feel as jittery as she did this morning. She is taking her Avapro 75 mg BID. She is still holding carvedilol 6.25 mg BID. She is still wearing her monitor. No monitor notifications have been received since 10/21 when the patient had a 4 s pause and Coreg was stopped.  She was instructed to call if BP consistently over 130/80 so she called today. She understands she will be called with Scott's recommendations.

## 2019-12-05 NOTE — Telephone Encounter (Signed)
Try to avoid caffeine. Agree with monitoring BP. If BP consistently > 130/80, we can try to increase the Irbesartan. Richardson Dopp, PA-C    12/05/2019 9:04 PM

## 2019-12-05 NOTE — Telephone Encounter (Signed)
Pt c/o BP issue: STAT if pt c/o blurred vision, one-sided weakness or slurred speech  1. What are your last 5 BP readings?  150/92 HR 102  2. Are you having any other symptoms (ex. Dizziness, headache, blurred vision, passed out)? Pt feels like she is shaking inside   3. What is your BP issue? Pt was told that while she is wearing her monitor if her BP went over 130/80 to call the office. This is also the highest her HR has ever been and she is worried

## 2019-12-06 NOTE — Telephone Encounter (Signed)
Confirmed with the patient she feels better today. Instructed her to avoid caffeine. She will continue to monitor BP and will call Friday with readings. (she has not checked her VS today) She was grateful for assistance.

## 2019-12-14 ENCOUNTER — Telehealth: Payer: Self-pay | Admitting: Cardiovascular Disease

## 2019-12-14 NOTE — Telephone Encounter (Signed)
Crystal from [employer] calling to critical EKG results.

## 2019-12-14 NOTE — Telephone Encounter (Addendum)
Spoke with Crystal from [employer]  and pt had episode of 3rd degree AVB with HR of 13 then converted to sinus at a rate of 85 Per [employer] pt hit button that stated passed out but pt did not pass out was  Lightheaded while  home sitting ./cy

## 2019-12-14 NOTE — Telephone Encounter (Signed)
Spoke with pt and does not feel poorly everyday Pt does note having  elevated HR Per pt HR is up and down along with a low  B/P Appt made with Dr Curt Bears for 12/19/19 at 11:00 am .Adonis Housekeeper

## 2019-12-14 NOTE — Telephone Encounter (Signed)
Per Dr Gasper Sells pt needs to see EP for possible PPM and if pt is symptomatic then should seek care at nearest ED .Adonis Housekeeper

## 2019-12-19 ENCOUNTER — Other Ambulatory Visit: Payer: Self-pay

## 2019-12-19 ENCOUNTER — Encounter: Payer: Self-pay | Admitting: Cardiology

## 2019-12-19 ENCOUNTER — Ambulatory Visit: Payer: Medicare HMO | Admitting: Cardiology

## 2019-12-19 VITALS — BP 122/88 | HR 71 | Ht 63.0 in | Wt 174.0 lb

## 2019-12-19 DIAGNOSIS — R55 Syncope and collapse: Secondary | ICD-10-CM

## 2019-12-19 DIAGNOSIS — Z01810 Encounter for preprocedural cardiovascular examination: Secondary | ICD-10-CM

## 2019-12-19 DIAGNOSIS — I495 Sick sinus syndrome: Secondary | ICD-10-CM | POA: Diagnosis not present

## 2019-12-19 DIAGNOSIS — Z01812 Encounter for preprocedural laboratory examination: Secondary | ICD-10-CM

## 2019-12-19 NOTE — Patient Instructions (Addendum)
Medication Instructions:  Your physician recommends that you continue on your current medications as directed. Please refer to the Current Medication list given to you today.  *If you need a refill on your cardiac medications before your next appointment, please call your pharmacy*   Lab Work: None ordered If you have labs (blood work) drawn today and your tests are completely normal, you will receive your results only by: Marland Kitchen MyChart Message (if you have MyChart) OR . A paper copy in the mail If you have any lab test that is abnormal or we need to change your treatment, we will call you to review the results.   Testing/Procedures: Your physician has recommended that you have a pacemaker inserted. A pacemaker is a small device that is placed under the skin of your chest or abdomen to help control abnormal heart rhythms. This device uses electrical pulses to prompt the heart to beat at a normal rate. Pacemakers are used to treat heart rhythms that are too slow. Wire (leads) are attached to the pacemaker that goes into the chambers of you heart. This is done in the hospital and usually requires and overnight stay. Please see the instruction sheet given to you today for more information.   Follow-Up: At [facility], you and your health needs are our priority.  As part of our continuing mission to provide you with exceptional heart care, we have created designated Provider Care Teams.  These Care Teams include your primary Cardiologist (physician) and Advanced Practice Providers (APPs -  Physician Assistants and Nurse Practitioners) who all work together to provide you with the care you need, when you need it.  Your next appointment:   2 week(s) after your pacemaker implant on 12/15  The format for your next appointment:   In Person  Provider:   Device clinic for a wound check    Thank you for choosing [facility]!!   Trinidad Curet, RN [phone]   Other Instructions    Implantable Device Instructions  You are scheduled for: Pacemaker implant on 01/17/20 with Dr. Curt Bears.  1.   Pre procedure testing-             A.  LAB WORK--- between 11/22 - 12/10 in the [place] office. You do not need to be fasting.  Avoid going between 12 pm - 2 pm              B. COVID TEST-- On 01/15/20 @ 11:30 am - This is a Drive Up Visit at [address] [place], [place] [address].  Someone will direct you to the appropriate testing line. Stay in your car and someone will be with you shortly.   After you are tested please go home and self quarantine until the day of your procedure.    2. On the day of your procedure 01/17/20 you will go to [facility] [phone]) at 12:30 pm.  Dennis Bast will go to the main entrance [address] [employer]) and enter where the [employer] are.  You will check in at ADMITTING.  You may have one support person come in to the hospital with you.  They will be asked to wait in the waiting room.   3.   Do not eat or drink after midnight prior to your procedure.   4.   On the morning of your procedure do NOT take any medication.  5.  The night before your procedure and the morning of your procedure  scrub your neck/chest with surgical scrub.  An instruction letter is included below.   5.  Plan for an overnight stay.  If you use your phone frequently bring your phone charger.  When you are discharged you will need someone to drive you home.   6.  You will follow up with the [place] clinic 10-14 days after your procedure. You will follow up with Dr. Curt Bears 91 days after your procedure.  These appointments will be made for you.   * If you have ANY questions after you get home, please call the office [phone] and ask for Shaleigh Laubscher RN or send a MyChart message.    [place] - Preparing For Surgery (surgical scrub)  Before surgery, you can play an important role. Because skin is not sterile, your skin needs to be as free of germs  as possible. You can reduce the number of germs on your skin by washing with CHG (chlorahexidine gluconate) Soap before surgery.  CHG is an antiseptic cleaner which kills germs and bonds with the skin to continue killing germs even after washing.   Please do not use if you have an allergy to CHG or antibacterial soaps.  If your skin becomes reddened/irritated stop using the CHG.   Do not shave (including legs and underarms) for at least 48 hours prior to first CHG shower.  It is OK to shave your face.  Please follow these instructions carefully:  1.  Shower the night before surgery and the morning of surgery with CHG.  2.  If you choose to wash your hair, wash your hair first as usual with your normal shampoo.  3.  After you shampoo, rinse your hair and body thoroughly to remove the shampoo.  4.  Use CHG as you would any other liquid soap.  You can apply CHG directly to the skin and wash gently with a clean washcloth. 5.  Apply the CHG Soap to your body ONLY FROM THE NECK DOWN.  Do not use on open wounds or open sores.  Avoid contact with your eyes, ears, mouth and genitals (private parts).  Wash genitals (private parts) with your normal soap.  6.  Wash thoroughly, paying special attention to the area where your surgery will be performed.  7.  Thoroughly rinse your body with warm water from the neck down.   8.  DO NOT shower/wash with your normal soap after using and rinsing off the CHG soap.  9.  Pat yourself dry with a clean towel.           10.  Wear clean pajamas.           11.  Place clean sheets on your bed the night of your first shower and do not sleep with pets.  Day of Surgery: Do not apply any deodorants/lotions.  Please wear clean clothes to the hospital/surgery center.    Pacemaker Implantation, Adult Pacemaker implantation is a procedure to place a pacemaker inside your chest. A pacemaker is a small computer that sends electrical signals to the heart and helps your heart beat  normally. A pacemaker also stores information about your heart rhythms. You may need pacemaker implantation if you:  Have a slow heartbeat (bradycardia).  Faint (syncope).  Have shortness of breath (dyspnea) due to heart problems. The pacemaker attaches to your heart through a wire, called a lead. Sometimes just one lead is needed. Other times, there will be two leads. There are two types of pacemakers:  Transvenous pacemaker. This type is placed under the skin or muscle of your chest. The lead goes through a vein in the chest area to reach the inside of the heart.  Epicardial pacemaker. This type is placed under the skin or muscle of your chest or belly. The lead goes through your chest to the outside of the heart. Tell a health care provider about:  Any allergies you have.  All medicines you are taking, including vitamins, herbs, eye drops, creams, and over-the-counter medicines.  Any problems you or family members have had with anesthetic medicines.  Any blood or bone disorders you have.  Any surgeries you have had.  Any medical conditions you have.  Whether you are pregnant or may be pregnant. What are the risks? Generally, this is a safe procedure. However, problems may occur, including:  Infection.  Bleeding.  Failure of the pacemaker or the lead.  Collapse of a lung or bleeding into a lung.  Blood clot inside a blood vessel with a lead.  Damage to the heart.  Infection inside the heart (endocarditis).  Allergic reactions to medicines. What happens before the procedure? Staying hydrated Follow instructions from your health care provider about hydration, which may include:  Up to 2 hours before the procedure - you may continue to drink clear liquids, such as water, clear fruit juice, black coffee, and plain tea. Eating and drinking restrictions Follow instructions from your health care provider about eating and drinking, which may include:  8 hours before the  procedure - stop eating heavy meals or foods such as meat, fried foods, or fatty foods.  6 hours before the procedure - stop eating light meals or foods, such as toast or cereal.  6 hours before the procedure - stop drinking milk or drinks that contain milk.  2 hours before the procedure - stop drinking clear liquids. Medicines  Ask your health care provider about: ? Changing or stopping your regular medicines. This is especially important if you are taking diabetes medicines or blood thinners. ? Taking medicines such as aspirin and ibuprofen. These medicines can thin your blood. Do not take these medicines before your procedure if your health care provider instructs you not to.  You may be given antibiotic medicine to help prevent infection. General instructions  You will have a heart evaluation. This may include an electrocardiogram (ECG), chest X-ray, and heart imaging (echocardiogram,  or echo) tests.  You will have blood tests.  Do not use any products that contain nicotine or tobacco, such as cigarettes and e-cigarettes. If you need help quitting, ask your health care provider.  Plan to have someone take you home from the hospital or clinic.  If you will be going home right after the procedure, plan to have someone with you for 24 hours.  Ask your health care provider how your surgical site will be marked or identified. What happens during the procedure?  To reduce your risk of infection: ? Your health care team will wash or sanitize their hands. ? Your skin will be washed with soap. ? Hair may be removed from the surgical area.  An IV tube will be inserted into one of your veins.  You will be given one or more of the following: ? A medicine to help you relax (sedative). ? A medicine to numb the area (local anesthetic). ? A medicine to make you fall asleep (general anesthetic).  If you are getting a transvenous pacemaker: ? An incision will be made in  your upper  chest. ? A pocket will be made for the pacemaker. It may be placed under the skin or between layers of muscle. ? The lead will be inserted into a blood vessel that returns to the heart. ? While X-rays are taken by an imaging machine (fluoroscopy), the lead will be advanced through the vein to the inside of your heart. ? The other end of the lead will be tunneled under the skin and attached to the pacemaker.  If you are getting an epicardial pacemaker: ? An incision will be made near your ribs or breastbone (sternum) for the lead. ? The lead will be attached to the outside of your heart. ? Another incision will be made in your chest or upper belly to create a pocket for the pacemaker. ? The free end of the lead will be tunneled under the skin and attached to the pacemaker.  The transvenous or epicardial pacemaker will be tested. Imaging studies may be done to check the lead position.  The incisions will be closed with stitches (sutures), adhesive strips, or skin glue.  Bandages (dressing) will be placed over the incisions. The procedure may vary among health care providers and hospitals. What happens after the procedure?  Your blood pressure, heart rate, breathing rate, and blood oxygen level will be monitored until the medicines you were given have worn off.  You will be given antibiotics and pain medicine.  ECG and chest x-rays will be done.  You will wear a continuous type of ECG (Holter monitor) to check your heart rhythm.  Your health care provider will program the pacemaker.  Do not drive for 24 hours if you received a sedative. This information is not intended to replace advice given to you by your health care provider. Make sure you discuss any questions you have with your health care provider. Document Revised: 10/08/2017 Document Reviewed: 07/03/2015 Elsevier Patient Education  [place].

## 2019-12-19 NOTE — Progress Notes (Signed)
Electrophysiology Office Note   Date:  12/19/2019   ID:  Heather, Crawford 1945-12-08, MRN [id number]  PCP:  Janith Lima, MD  Cardiologist:  Burt Knack Primary Electrophysiologist:  Tannen Vandezande Meredith Leeds, MD    Chief Complaint: Near syncope   History of Present Illness: Heather Crawford is a 74 y.o. female who is being seen today for the evaluation of near syncope at the request of Janith Lima, MD. Presenting today for electrophysiology evaluation.  She has a history of aortic stenosis status post AVR, coronary artery disease status post CABG in 6073, diastolic heart failure, left bundle branch block.  Over the past few years, she has had episodes of dizziness and near syncope.  She feels that she is "dying" when this happens.  She gets diaphoretic but does not have chest pain.  She did have an episode of syncope in 2018.  Cardiac monitor did not show any arrhythmia.  Today, she denies symptoms of palpitations, chest pain, shortness of breath, orthopnea, PND, lower extremity edema, claudication, dizziness, presyncope, syncope, bleeding, or neurologic sequela. The patient is tolerating medications without difficulties.  She continues to have episodes of near syncope and lightheadedness.  These have correlated at times with phone calls based on pauses on her monitor.  She did stop her carvedilol which somewhat improved her symptoms, but they have continued with less frequency.   Past Medical History:  Diagnosis Date  . Allergic rhinitis, cause unspecified   . Aortic stenosis    a.  echo 1/12 w/mildly dilated LV, EF 45-50% w/paradoxical septal motion consistent w/ LBBB, moderate aortic stenosis w/mean gradient 27 mmHg, trivial AI, mild to moderate MR w/calcified mitral valve;   b. s/p AVR with 21 mm Magna Ease valve  . Aortic valve replaced 2012  . Arthritis    joint pain  . CAD (coronary artery disease)    a. s/p CABG 4/12: RIMA-RCA  . CAD, multiple vessel    sees Dr Aundra Dubin  every 3-4 months  . Cancer ([facility])    skin sarcomas removed  . Carotid artery disease ([place])    Carotid US 10/21: Bilateral ICA 1-39; left subclavian stenosis  . CHF (congestive heart failure) (H[facility])    chronic diastolic CHF  . Chronic back pain    H/O BACK SURGERY  . Coronary artery disease    adenosine myoview 2/12 w/EF 56%, ISCHEMIA W/SCAR IN THE MID TO APICAL ANTERIO WALL, SEPTAL WALL, AND APEX. LHC 3/12 W/EF 55%, 40% OSTIAL RCA, 60% MID RCA, 95% DISTAL RC A, INTERVENTION  ATTEMPTED BUT UNABLE TO ADEQUADATELY SEAT  CATHETER.  . Diverticulitis of colon (without mention of hemorrhage)(562.11)   . Diverticulosis   . Esophageal reflux   . Glucose intolerance (impaired glucose tolerance)    A1C 6.3 (4/12)  . H/O hiatal hernia   . H/O vertigo    with fluid  in ears  . Heart murmur   . Hemorrhoids   . Hemorrhoids   . History of rheumatic fever   . Hx of hysterectomy   . Hx: UTI (urinary tract infection)   . Hyperlipidemia   . LBBB (left bundle branch block)   . Myocardial infarction ([place]) 2002  . Pneumonia   . PONV (postoperative nausea and vomiting)   . 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 Snoring   . Squamous cell carcinoma of skin    nose  . Urinary frequency    Past Surgical History:  Procedure Laterality Date  .  ABDOMINAL HYSTERECTOMY    . APPENDECTOMY  1973  . BACK SURGERY     Lumbar Lam and discectomy x 2  . BREAST BIOPSY Right 10/2011  . BREAST LUMPECTOMY Right   . CARDIAC CATHETERIZATION  2012  . CARDIAC VALVE REPLACEMENT  2012   Aortic  . CORONARY ARTERY BYPASS GRAFT  2012  . DILATION AND CURETTAGE OF UTERUS    . ENDOMETRIAL ABLATION    . LAPAROSCOPIC LYSIS INTESTINAL ADHESIONS    . LUMBAR [place]  . SKIN SURGERY     skin sarcomas removed  . TONSILLECTOMY     1955  . TONSILLECTOMY       Current Outpatient Medications  Medication Sig Dispense Refill  . aspirin 325 MG tablet Take 325 mg by mouth daily.    Marland Kitchen azithromycin  (ZITHROMAX) 500 MG tablet Use as directed one (1) hour prior to dental procedures.    Marland Kitchen CALCIUM CITRATE PO Take 1 tablet by mouth daily.    . Cholecalciferol 2000 units TABS Take 1 tablet (2,000 Units total) by mouth daily. 90 tablet 1  . fluticasone (FLONASE) 50 MCG/ACT nasal spray USE 1 SPRAY IN EACH NOSTRIL EVERY DAY 32 g 11  . furosemide (LASIX) 20 MG tablet TAKE 1 TABLET DAILY AS NEEDED FOR EDEMA OR WEIGHT GAIN. TAKE POTASSIUM 10MEQ WHEN YOU TAKE LASIX 90 tablet 2  . icosapent Ethyl (VASCEPA) 1 g capsule Take 2 capsules (2 g total) by mouth 2 (two) times daily. 360 capsule 1  . irbesartan (AVAPRO) 150 MG tablet Take 75 mg by mouth in the morning and at bedtime.    . meclizine (ANTIVERT) 12.5 MG tablet Take 1 tablet (12.5 mg total) by mouth 3 (three) times daily as needed for dizziness. 90 tablet 3  . omeprazole (PRILOSEC) 40 MG capsule TAKE 1 CAPSULE EVERY DAY 90 capsule 1  . potassium chloride (K-DUR,KLOR-CON) 10 MEQ tablet Take 1 tablet by mouth on the days you take lasix (furosemide) 90 tablet 3  . simvastatin (ZOCOR) 40 MG tablet TAKE 1 TABLET AT BEDTIME 90 tablet 2  . dapagliflozin propanediol (FARXIGA) 10 MG TABS tablet Take 1 tablet (10 mg total) by mouth daily before breakfast. (Patient not taking: Reported on 12/19/2019) 90 tablet 1   No current facility-administered medications for this visit.    Allergies:   Amoxicillin, Crestor [rosuvastatin], Lipitor [atorvastatin], Neomycin, Sulfamethoxazole-trimethoprim, Tape, Neosporin [neomycin-bacitracin zn-polymyx], and Polysporin [bacitracin-polymyxin b]   Social History:  The patient  reports that she quit smoking about 19 years ago. Her smoking use included cigarettes. She has a 40.00 pack-year smoking history. She has quit using smokeless tobacco.  Her smokeless tobacco use included chew. She reports that she does not drink alcohol and does not use drugs.   Family History:  The patient's family history includes Breast cancer in her  maternal aunt, mother, paternal grandmother, and sister; CAD in her father; Colon cancer in her maternal aunt and paternal uncle; Diabetes type II in her mother; Heart attack in her father; Heart disease in her mother; Hypertension in her mother; Hypothyroidism in her mother; Pancreatic cancer in an other family member.    ROS:  Please see the history of present illness.   Otherwise, review of systems is positive for none.   All other systems are reviewed and negative.    PHYSICAL EXAM: VS:  BP 122/88   Pulse 71   Ht 5\' 3"  (1.6 m)   Wt 174  lb (78.9 kg)   SpO2 99%   BMI 30.82 kg/m  , BMI Body mass index is 30.82 kg/m. GEN: Well nourished, well developed, in no acute distress  HEENT: normal  Neck: no JVD, carotid bruits, or masses Cardiac: RRR; no murmurs, rubs, or gallops,no edema  Respiratory:  clear to auscultation bilaterally, normal work of breathing GI: soft, nontender, nondistended, + BS MS: no deformity or atrophy  Skin: warm and dry Neuro:  Strength and sensation are intact Psych: euthymic mood, full affect  EKG:  EKG is not ordered today. Personal review of the ekg ordered 11/07/2019 shows sinus rhythm, left bundle branch block  Recent Labs: 06/20/2019: ALT 20; BUN 15; Creatinine, Ser 0.68; Potassium 4.2; Sodium 139; TSH 5.33 09/12/2019: Hemoglobin 12.4; Platelets 226    Lipid Panel     Component Value Date/Time   CHOL 134 06/20/2019 0927   CHOL 150 01/06/2018 1113   TRIG 216.0 (H) 06/20/2019 0927   HDL 36.40 (L) 06/20/2019 0927   HDL 42 01/06/2018 1113   CHOLHDL 4 06/20/2019 0927   VLDL 43.2 (H) 06/20/2019 0927   LDLCALC 61 01/06/2018 1113   LDLDIRECT 69.0 06/20/2019 0927     Wt Readings from Last 3 Encounters:  12/19/19 174 lb (78.9 kg)  11/07/19 174 lb (78.9 kg)  09/12/19 173 lb (78.5 kg)      Other studies Reviewed: Additional studies/ records that were reviewed today include: TTE 05/03/19  Review of the above records today demonstrates:  1. Left  ventricular ejection fraction, by estimation, is 65 to 70%. The  left ventricle has hyperdynamic function. The left ventricle has no  regional wall motion abnormalities. Left ventricular diastolic parameters  are consistent with Grade II diastolic  dysfunction (pseudonormalization). Elevated left atrial pressure.  2. Right ventricular systolic function is normal. The right ventricular  size is normal. There is normal pulmonary artery systolic pressure. The  estimated right ventricular systolic pressure is 27.7 mmHg.  3. The mitral valve is degenerative. Mild to moderate mitral valve  regurgitation. No evidence of mitral stenosis.  4. The aortic valve has been repaired/replaced. Aortic valve  regurgitation is not visualized. No aortic stenosis is present. There is a  21 mm Magna Ease valve present in the aortic position. Procedure Date:  2012. Echo findings are consistent with normal  structure and function of the aortic valve prosthesis.    ASSESSMENT AND PLAN:  1.  Near syncope: Feeling well today but has had episodes of near syncope.  These have correlated at times with pauses on her cardiac monitor.  Due to that, it appears that pacemaker implant would be beneficial.  Risks and benefits have been discussed with the patient,, husband, and daughter and include bleeding, tamponade, infection, pneumothorax.  They understand the risks and has agreed to the procedure.  2.  Coronary artery disease status post CABG x1 in 2012: Low risk Myoview recently.  Plan per primary cardiology.  3.  Aortic stenosis status post AVR: Normally functioning by most recent echo.  Case discussed with primary cardiology  Current medicines are reviewed at length with the patient today.   The patient does not have concerns regarding her medicines.  The following changes were made today:  none  Labs/ tests ordered today include:  Orders Placed This Encounter  Procedures  . Basic metabolic panel  . CBC  .  EKG 12-Lead     Disposition:   FU with Jeselle Hiser 3 months  Signed, Forest Pruden Meredith Leeds, MD  12/19/2019 12:23 PM     [address] [place] [id number] [phone] (office) [phone] (fax)

## 2019-12-19 NOTE — H&P (View-Only) (Signed)
Electrophysiology Office Note   Date:  12/19/2019   ID:  Komal, Stangelo [birth year], MRN [id number]  PCP:  Janith Lima, MD  Cardiologist:  Burt Knack Primary Electrophysiologist:  Terez Freimark Meredith Leeds, MD    Chief Complaint: Near syncope   History of Present Illness: Heather Crawford is a 74 y.o. female who is being seen today for the evaluation of near syncope at the request of Janith Lima, MD. Presenting today for electrophysiology evaluation.  She has a history of aortic stenosis status post AVR, coronary artery disease status post CABG in 0347, diastolic heart failure, left bundle branch block.  Over the past few years, she has had episodes of dizziness and near syncope.  She feels that she is "dying" when this happens.  She gets diaphoretic but does not have chest pain.  She did have an episode of syncope in 2018.  Cardiac monitor did not show any arrhythmia.  Today, she denies symptoms of palpitations, chest pain, shortness of breath, orthopnea, PND, lower extremity edema, claudication, dizziness, presyncope, syncope, bleeding, or neurologic sequela. The patient is tolerating medications without difficulties.  She continues to have episodes of near syncope and lightheadedness.  These have correlated at times with phone calls based on pauses on her monitor.  She did stop her carvedilol which somewhat improved her symptoms, but they have continued with less frequency.   Past Medical History:  Diagnosis Date  . Allergic rhinitis, cause unspecified   . Aortic stenosis    a.  echo 1/12 w/mildly dilated LV, EF 45-50% w/paradoxical septal motion consistent w/ LBBB, moderate aortic stenosis w/mean gradient 27 mmHg, trivial AI, mild to moderate MR w/calcified mitral valve;   b. s/p AVR with 21 mm Magna Ease valve  . Aortic valve replaced 2012  . Arthritis    joint pain  . CAD (coronary artery disease)    a. s/p CABG 4/12: RIMA-RCA  . CAD, multiple vessel    sees Dr Aundra Dubin  every 3-4 months  . Cancer ([place])    skin sarcomas removed  . Carotid artery disease ([place])    Carotid US 10/21: Bilateral ICA 1-39; left subclavian stenosis  . CHF (congestive heart failure) (H[facility])    chronic diastolic CHF  . Chronic back pain    H/O BACK SURGERY  . Coronary artery disease    adenosine myoview 2/12 w/EF 56%, ISCHEMIA W/SCAR IN THE MID TO APICAL ANTERIO WALL, SEPTAL WALL, AND APEX. LHC 3/12 W/EF 55%, 40% OSTIAL RCA, 60% MID RCA, 95% DISTAL RC A, INTERVENTION  ATTEMPTED BUT UNABLE TO ADEQUADATELY SEAT  CATHETER.  . Diverticulitis of colon (without mention of hemorrhage)(562.11)   . Diverticulosis   . Esophageal reflux   . Glucose intolerance (impaired glucose tolerance)    A1C 6.3 (4/12)  . H/O hiatal hernia   . H/O vertigo    with fluid  in ears  . Heart murmur   . Hemorrhoids   . Hemorrhoids   . History of rheumatic fever   . Hx of hysterectomy   . Hx: UTI (urinary tract infection)   . Hyperlipidemia   . LBBB (left bundle branch block)   . Myocardial infarction ([place]) 2002  . Pneumonia   . PONV (postoperative nausea and vomiting)   . 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 Snoring   . Squamous cell carcinoma of skin    nose  . Urinary frequency    Past Surgical History:  Procedure Laterality Date  .  ABDOMINAL HYSTERECTOMY    . APPENDECTOMY  1973  . BACK SURGERY     Lumbar Lam and discectomy x 2  . BREAST BIOPSY Right 10/2011  . BREAST LUMPECTOMY Right   . CARDIAC CATHETERIZATION  2012  . CARDIAC VALVE REPLACEMENT  2012   Aortic  . CORONARY ARTERY BYPASS GRAFT  2012  . DILATION AND CURETTAGE OF UTERUS    . ENDOMETRIAL ABLATION    . LAPAROSCOPIC LYSIS INTESTINAL ADHESIONS    . LUMBAR [place]  . SKIN SURGERY     skin sarcomas removed  . TONSILLECTOMY     1955  . TONSILLECTOMY       Current Outpatient Medications  Medication Sig Dispense Refill  . aspirin 325 MG tablet Take 325 mg by mouth daily.    Marland Kitchen azithromycin  (ZITHROMAX) 500 MG tablet Use as directed one (1) hour prior to dental procedures.    Marland Kitchen CALCIUM CITRATE PO Take 1 tablet by mouth daily.    . Cholecalciferol 2000 units TABS Take 1 tablet (2,000 Units total) by mouth daily. 90 tablet 1  . fluticasone (FLONASE) 50 MCG/ACT nasal spray USE 1 SPRAY IN EACH NOSTRIL EVERY DAY 32 g 11  . furosemide (LASIX) 20 MG tablet TAKE 1 TABLET DAILY AS NEEDED FOR EDEMA OR WEIGHT GAIN. TAKE POTASSIUM 10MEQ WHEN YOU TAKE LASIX 90 tablet 2  . icosapent Ethyl (VASCEPA) 1 g capsule Take 2 capsules (2 g total) by mouth 2 (two) times daily. 360 capsule 1  . irbesartan (AVAPRO) 150 MG tablet Take 75 mg by mouth in the morning and at bedtime.    . meclizine (ANTIVERT) 12.5 MG tablet Take 1 tablet (12.5 mg total) by mouth 3 (three) times daily as needed for dizziness. 90 tablet 3  . omeprazole (PRILOSEC) 40 MG capsule TAKE 1 CAPSULE EVERY DAY 90 capsule 1  . potassium chloride (K-DUR,KLOR-CON) 10 MEQ tablet Take 1 tablet by mouth on the days you take lasix (furosemide) 90 tablet 3  . simvastatin (ZOCOR) 40 MG tablet TAKE 1 TABLET AT BEDTIME 90 tablet 2  . dapagliflozin propanediol (FARXIGA) 10 MG TABS tablet Take 1 tablet (10 mg total) by mouth daily before breakfast. (Patient not taking: Reported on 12/19/2019) 90 tablet 1   No current facility-administered medications for this visit.    Allergies:   Amoxicillin, Crestor [rosuvastatin], Lipitor [atorvastatin], Neomycin, Sulfamethoxazole-trimethoprim, Tape, Neosporin [neomycin-bacitracin zn-polymyx], and Polysporin [bacitracin-polymyxin b]   Social History:  The patient  reports that she quit smoking about 19 years ago. Her smoking use included cigarettes. She has a 40.00 pack-year smoking history. She has quit using smokeless tobacco.  Her smokeless tobacco use included chew. She reports that she does not drink alcohol and does not use drugs.   Family History:  The patient's family history includes Breast cancer in her  maternal aunt, mother, paternal grandmother, and sister; CAD in her father; Colon cancer in her maternal aunt and paternal uncle; Diabetes type II in her mother; Heart attack in her father; Heart disease in her mother; Hypertension in her mother; Hypothyroidism in her mother; Pancreatic cancer in an other family member.    ROS:  Please see the history of present illness.   Otherwise, review of systems is positive for none.   All other systems are reviewed and negative.    PHYSICAL EXAM: VS:  BP 122/88   Pulse 71   Ht 5\' 3"  (1.6 m)   Wt 174  lb (78.9 kg)   SpO2 99%   BMI 30.82 kg/m  , BMI Body mass index is 30.82 kg/m. GEN: Well nourished, well developed, in no acute distress  HEENT: normal  Neck: no JVD, carotid bruits, or masses Cardiac: RRR; no murmurs, rubs, or gallops,no edema  Respiratory:  clear to auscultation bilaterally, normal work of breathing GI: soft, nontender, nondistended, + BS MS: no deformity or atrophy  Skin: warm and dry Neuro:  Strength and sensation are intact Psych: euthymic mood, full affect  EKG:  EKG is not ordered today. Personal review of the ekg ordered 11/07/2019 shows sinus rhythm, left bundle branch block  Recent Labs: 06/20/2019: ALT 20; BUN 15; Creatinine, Ser 0.68; Potassium 4.2; Sodium 139; TSH 5.33 09/12/2019: Hemoglobin 12.4; Platelets 226    Lipid Panel     Component Value Date/Time   CHOL 134 06/20/2019 0927   CHOL 150 01/06/2018 1113   TRIG 216.0 (H) 06/20/2019 0927   HDL 36.40 (L) 06/20/2019 0927   HDL 42 01/06/2018 1113   CHOLHDL 4 06/20/2019 0927   VLDL 43.2 (H) 06/20/2019 0927   LDLCALC 61 01/06/2018 1113   LDLDIRECT 69.0 06/20/2019 0927     Wt Readings from Last 3 Encounters:  12/19/19 174 lb (78.9 kg)  11/07/19 174 lb (78.9 kg)  09/12/19 173 lb (78.5 kg)      Other studies Reviewed: Additional studies/ records that were reviewed today include: TTE 05/03/19  Review of the above records today demonstrates:  1. Left  ventricular ejection fraction, by estimation, is 65 to 70%. The  left ventricle has hyperdynamic function. The left ventricle has no  regional wall motion abnormalities. Left ventricular diastolic parameters  are consistent with Grade II diastolic  dysfunction (pseudonormalization). Elevated left atrial pressure.  2. Right ventricular systolic function is normal. The right ventricular  size is normal. There is normal pulmonary artery systolic pressure. The  estimated right ventricular systolic pressure is 46.8 mmHg.  3. The mitral valve is degenerative. Mild to moderate mitral valve  regurgitation. No evidence of mitral stenosis.  4. The aortic valve has been repaired/replaced. Aortic valve  regurgitation is not visualized. No aortic stenosis is present. There is a  21 mm Magna Ease valve present in the aortic position. Procedure Date:  2012. Echo findings are consistent with normal  structure and function of the aortic valve prosthesis.    ASSESSMENT AND PLAN:  1.  Near syncope: Feeling well today but has had episodes of near syncope.  These have correlated at times with pauses on her cardiac monitor.  Due to that, it appears that pacemaker implant would be beneficial.  Risks and benefits have been discussed with the patient,, husband, and daughter and include bleeding, tamponade, infection, pneumothorax.  They understand the risks and has agreed to the procedure.  2.  Coronary artery disease status post CABG x1 in 2012: Low risk Myoview recently.  Plan per primary cardiology.  3.  Aortic stenosis status post AVR: Normally functioning by most recent echo.  Case discussed with primary cardiology  Current medicines are reviewed at length with the patient today.   The patient does not have concerns regarding her medicines.  The following changes were made today:  none  Labs/ tests ordered today include:  Orders Placed This Encounter  Procedures  . Basic metabolic panel  . CBC  .  EKG 12-Lead     Disposition:   FU with Ilijah Doucet 3 months  Signed, Blane Worthington Meredith Leeds, MD  12/19/2019 12:23 PM     [address] [place] [address] [phone] (office) [phone] (fax)

## 2019-12-23 ENCOUNTER — Other Ambulatory Visit: Payer: Self-pay | Admitting: Gastroenterology

## 2019-12-25 ENCOUNTER — Other Ambulatory Visit: Payer: Self-pay | Admitting: Cardiovascular Disease

## 2019-12-25 DIAGNOSIS — E785 Hyperlipidemia, unspecified: Secondary | ICD-10-CM

## 2019-12-25 NOTE — Telephone Encounter (Signed)
Patient needs office visit.  

## 2019-12-25 NOTE — Telephone Encounter (Signed)
rx refill

## 2020-01-04 DIAGNOSIS — J019 Acute sinusitis, unspecified: Secondary | ICD-10-CM | POA: Diagnosis not present

## 2020-01-04 DIAGNOSIS — R051 Acute cough: Secondary | ICD-10-CM | POA: Diagnosis not present

## 2020-01-04 DIAGNOSIS — Z20828 Contact with and (suspected) exposure to other viral communicable diseases: Secondary | ICD-10-CM | POA: Diagnosis not present

## 2020-01-09 ENCOUNTER — Telehealth: Payer: Self-pay | Admitting: Internal Medicine

## 2020-01-09 NOTE — Progress Notes (Signed)
  Chronic Care Management   Note  01/09/2020 Name: Heather Crawford MRN: [id number] DOB: 01/19/1946  Heather Crawford is a 74 y.o. year old female who is a primary care patient of Janith Lima, MD. I reached out to [name] by phone today in response to a referral sent by Heather Crawford's PCP, Janith Lima, MD.   Heather Crawford was given information about Chronic Care Management services today including:  1. CCM service includes personalized support from designated clinical staff supervised by her physician, including individualized plan of care and coordination with other care providers 2. 24/7 contact phone numbers for assistance for urgent and routine care needs. 3. Service will only be billed when office clinical staff spend 20 minutes or more in a month to coordinate care. 4. Only one practitioner may furnish and bill the service in a calendar month. 5. The patient may stop CCM services at any time (effective at the end of the month) by phone call to the office staff.   Patient wishes to consider information provided and/or speak with a member of the care team before deciding about enrollment in care management services.   Follow up plan:   Carley Perdue [employer] Scheduler

## 2020-01-10 DIAGNOSIS — R55 Syncope and collapse: Secondary | ICD-10-CM | POA: Diagnosis not present

## 2020-01-10 DIAGNOSIS — Z01812 Encounter for preprocedural laboratory examination: Secondary | ICD-10-CM | POA: Diagnosis not present

## 2020-01-10 LAB — BASIC METABOLIC PANEL
BUN/Creatinine Ratio: 17 (ref 12–28)
BUN: 14 mg/dL (ref 8–27)
CO2: 24 mmol/L (ref 20–29)
Calcium: 9.1 mg/dL (ref 8.7–10.3)
Chloride: 99 mmol/L (ref 96–106)
Creatinine, Ser: 0.84 mg/dL (ref 0.57–1.00)
GFR calc Af Amer: 78 mL/min/{1.73_m2} (ref >59)
GFR calc non Af Amer: 68 mL/min/{1.73_m2} (ref >59)
Glucose: 244 mg/dL — ABNORMAL HIGH (ref 65–99)
Potassium: 5.1 mmol/L (ref 3.5–5.2)
Sodium: 137 mmol/L (ref 134–144)

## 2020-01-10 LAB — CBC
Hematocrit: 38.3 % (ref 34.0–46.6)
Hemoglobin: 12.7 g/dL (ref 11.1–15.9)
MCH: 29.9 pg (ref 26.6–33.0)
MCHC: 33.2 g/dL (ref 31.5–35.7)
MCV: 90 fL (ref 79–97)
Platelets: 214 10*3/uL (ref 150–450)
RBC: 4.25 x10E6/uL (ref 3.77–5.28)
RDW: 11.8 % (ref 11.7–15.4)
WBC: 5 10*3/uL (ref 3.4–10.8)

## 2020-01-11 DIAGNOSIS — Z1231 Encounter for screening mammogram for malignant neoplasm of breast: Secondary | ICD-10-CM | POA: Diagnosis not present

## 2020-01-15 ENCOUNTER — Telehealth: Payer: Self-pay | Admitting: *Deleted

## 2020-01-15 ENCOUNTER — Other Ambulatory Visit (HOSPITAL_COMMUNITY)
Admission: RE | Admit: 2020-01-15 | Discharge: 2020-01-15 | Disposition: A | Discharge status: Home / Self Care | Payer: Medicare HMO | Source: Ambulatory Visit | Attending: Cardiology | Admitting: Cardiology

## 2020-01-15 DIAGNOSIS — Z01812 Encounter for preprocedural laboratory examination: Secondary | ICD-10-CM | POA: Insufficient documentation

## 2020-01-15 DIAGNOSIS — Z20822 Contact with and (suspected) exposure to covid-19: Secondary | ICD-10-CM | POA: Diagnosis not present

## 2020-01-15 LAB — SARS CORONAVIRUS 2 (TAT 6-24 HRS): SARS Coronavirus 2: NEGATIVE

## 2020-01-15 NOTE — Telephone Encounter (Signed)
Called patient to inform her of scheduling change for Wednesday for her PPM implant. Pt made aware to arrange at 2:30 pm for procedure, not 12:30 as originally instructed. Pt asking to stay overnight being that procedure is so late and she does not live close to a hospital. Aware I will discuss w/ Camnitz and let her know tomorrow. She is agreeable to plan.

## 2020-01-16 NOTE — Telephone Encounter (Signed)
Pt made aware she will stay overnight after procedure tomorrow. Advised she may have a light breakfast by 8 am tomorrow morning, NPO after 8am.  Due to dry mouth, she has been advised she may take sip of water to help, she also has biotene. Patient verbalized understanding and agreeable to plan.

## 2020-01-16 NOTE — Progress Notes (Signed)
Instructed patient on the following items: Arrival time 1430 Nothing to eat or drink after midnight No meds AM of procedure Responsible person to drive you home and stay with you for 24 hrs Wash with special soap night before and morning of procedure

## 2020-01-17 ENCOUNTER — Other Ambulatory Visit: Payer: Self-pay

## 2020-01-17 ENCOUNTER — Ambulatory Visit (HOSPITAL_COMMUNITY)
Admission: RE | Admit: 2020-01-17 | Discharge: 2020-01-18 | Disposition: A | Discharge status: Home / Self Care | Payer: Medicare HMO | Attending: Cardiology | Admitting: Cardiology

## 2020-01-17 ENCOUNTER — Ambulatory Visit (HOSPITAL_COMMUNITY)
Admission: RE | Disposition: A | Discharge status: Home / Self Care | Payer: Self-pay | Source: Home / Self Care | Attending: Cardiology

## 2020-01-17 DIAGNOSIS — Z951 Presence of aortocoronary bypass graft: Secondary | ICD-10-CM | POA: Diagnosis not present

## 2020-01-17 DIAGNOSIS — R55 Syncope and collapse: Secondary | ICD-10-CM | POA: Insufficient documentation

## 2020-01-17 DIAGNOSIS — Z880 Allergy status to penicillin: Secondary | ICD-10-CM | POA: Insufficient documentation

## 2020-01-17 DIAGNOSIS — I350 Nonrheumatic aortic (valve) stenosis: Secondary | ICD-10-CM | POA: Diagnosis not present

## 2020-01-17 DIAGNOSIS — Z85828 Personal history of other malignant neoplasm of skin: Secondary | ICD-10-CM | POA: Insufficient documentation

## 2020-01-17 DIAGNOSIS — I700 Atherosclerosis of aorta: Secondary | ICD-10-CM | POA: Insufficient documentation

## 2020-01-17 DIAGNOSIS — I442 Atrioventricular block, complete: Secondary | ICD-10-CM

## 2020-01-17 DIAGNOSIS — Z952 Presence of prosthetic heart valve: Secondary | ICD-10-CM | POA: Insufficient documentation

## 2020-01-17 DIAGNOSIS — Z888 Allergy status to other drugs, medicaments and biological substances status: Secondary | ICD-10-CM | POA: Diagnosis not present

## 2020-01-17 DIAGNOSIS — Z7982 Long term (current) use of aspirin: Secondary | ICD-10-CM | POA: Insufficient documentation

## 2020-01-17 DIAGNOSIS — Z87891 Personal history of nicotine dependence: Secondary | ICD-10-CM | POA: Diagnosis not present

## 2020-01-17 DIAGNOSIS — Z881 Allergy status to other antibiotic agents status: Secondary | ICD-10-CM | POA: Insufficient documentation

## 2020-01-17 DIAGNOSIS — Z79899 Other long term (current) drug therapy: Secondary | ICD-10-CM | POA: Insufficient documentation

## 2020-01-17 DIAGNOSIS — Z95818 Presence of other cardiac implants and grafts: Secondary | ICD-10-CM

## 2020-01-17 DIAGNOSIS — Z7984 Long term (current) use of oral hypoglycemic drugs: Secondary | ICD-10-CM | POA: Diagnosis not present

## 2020-01-17 DIAGNOSIS — Z8249 Family history of ischemic heart disease and other diseases of the circulatory system: Secondary | ICD-10-CM | POA: Diagnosis not present

## 2020-01-17 HISTORY — PX: PACEMAKER IMPLANT: EP1218

## 2020-01-17 SURGERY — PACEMAKER IMPLANT

## 2020-01-17 MED ORDER — CHLORHEXIDINE GLUCONATE 4 % EX LIQD
4.0000 "application " | Freq: Once | CUTANEOUS | Status: DC
  Filled 2020-01-17: qty 60

## 2020-01-17 MED ORDER — LIDOCAINE HCL 1 % IJ SOLN
INTRAMUSCULAR | Status: AC
  Filled 2020-01-17: qty 60

## 2020-01-17 MED ORDER — FENTANYL CITRATE (PF) 100 MCG/2ML IJ SOLN
INTRAMUSCULAR | Status: AC
  Filled 2020-01-17: qty 2

## 2020-01-17 MED ORDER — VANCOMYCIN HCL IN DEXTROSE 1-5 GM/200ML-% IV SOLN
INTRAVENOUS | Status: AC
  Filled 2020-01-17: qty 200

## 2020-01-17 MED ORDER — ASPIRIN EC 325 MG PO TBEC
325.0000 mg | DELAYED_RELEASE_TABLET | Freq: Every day | ORAL | Status: DC
  Administered 2020-01-17 – 2020-01-18 (×2): 325 mg via ORAL
  Filled 2020-01-17 (×2): qty 1

## 2020-01-17 MED ORDER — HYDRALAZINE HCL 20 MG/ML IJ SOLN
INTRAMUSCULAR | Status: DC | PRN
  Administered 2020-01-17: 10 mg via INTRAVENOUS

## 2020-01-17 MED ORDER — LIDOCAINE HCL (PF) 1 % IJ SOLN
INTRAMUSCULAR | Status: DC | PRN
  Administered 2020-01-17: 20 mL

## 2020-01-17 MED ORDER — MECLIZINE HCL 12.5 MG PO TABS
12.5000 mg | ORAL_TABLET | Freq: Three times a day (TID) | ORAL | Status: DC | PRN
  Filled 2020-01-17: qty 1

## 2020-01-17 MED ORDER — PANTOPRAZOLE SODIUM 40 MG PO TBEC
40.0000 mg | DELAYED_RELEASE_TABLET | Freq: Every day | ORAL | Status: DC
  Administered 2020-01-17 – 2020-01-18 (×2): 40 mg via ORAL
  Filled 2020-01-17 (×2): qty 1

## 2020-01-17 MED ORDER — MIDAZOLAM HCL 5 MG/5ML IJ SOLN
INTRAMUSCULAR | Status: DC | PRN
  Administered 2020-01-17: 1 mg via INTRAVENOUS

## 2020-01-17 MED ORDER — FISH OIL 1000 MG PO CAPS
1000.0000 mg | ORAL_CAPSULE | Freq: Every day | ORAL | Status: DC
  Filled 2020-01-17: qty 1

## 2020-01-17 MED ORDER — MIDAZOLAM HCL 5 MG/5ML IJ SOLN
INTRAMUSCULAR | Status: AC
  Filled 2020-01-17: qty 5

## 2020-01-17 MED ORDER — SODIUM CHLORIDE 0.9 % IR SOLN: 500.0000 mL | Freq: Once | Status: DC

## 2020-01-17 MED ORDER — IRBESARTAN 75 MG PO TABS
75.0000 mg | ORAL_TABLET | Freq: Every day | ORAL | Status: DC
  Administered 2020-01-17 – 2020-01-18 (×2): 75 mg via ORAL
  Filled 2020-01-17 (×2): qty 1

## 2020-01-17 MED ORDER — OMEGA-3-ACID ETHYL ESTERS 1 G PO CAPS
1.0000 g | ORAL_CAPSULE | Freq: Every day | ORAL | Status: DC
  Administered 2020-01-18: 09:00:00 1 g via ORAL
  Filled 2020-01-17: qty 1

## 2020-01-17 MED ORDER — VITAMIN D 25 MCG (1000 UNIT) PO TABS
2000.0000 [IU] | ORAL_TABLET | Freq: Every day | ORAL | Status: DC
  Administered 2020-01-17 – 2020-01-18 (×2): 2000 [IU] via ORAL
  Filled 2020-01-17 (×2): qty 2

## 2020-01-17 MED ORDER — VANCOMYCIN HCL IN DEXTROSE 1-5 GM/200ML-% IV SOLN
1000.0000 mg | Freq: Two times a day (BID) | INTRAVENOUS | Status: AC
Start: 2020-01-18 — End: 2020-01-18
  Administered 2020-01-18: 08:00:00 1000 mg via INTRAVENOUS
  Filled 2020-01-17: qty 200

## 2020-01-17 MED ORDER — HYDRALAZINE HCL 20 MG/ML IJ SOLN
INTRAMUSCULAR | Status: AC
  Filled 2020-01-17: qty 1

## 2020-01-17 MED ORDER — ONDANSETRON HCL 4 MG/2ML IJ SOLN: 4.0000 mg | Freq: Four times a day (QID) | INTRAMUSCULAR | Status: DC | PRN

## 2020-01-17 MED ORDER — FLUTICASONE PROPIONATE 50 MCG/ACT NA SUSP
1.0000 | Freq: Every day | NASAL | Status: DC
  Filled 2020-01-17: qty 16

## 2020-01-17 MED ORDER — AZITHROMYCIN 500 MG PO TABS: 500.0000 mg | ORAL_TABLET | ORAL | Status: DC

## 2020-01-17 MED ORDER — VANCOMYCIN HCL IN DEXTROSE 1-5 GM/200ML-% IV SOLN
1000.0000 mg | INTRAVENOUS | Status: AC
  Administered 2020-01-17: 17:00:00 1000 mg via INTRAVENOUS
  Filled 2020-01-17: qty 200

## 2020-01-17 MED ORDER — FENTANYL CITRATE (PF) 100 MCG/2ML IJ SOLN
INTRAMUSCULAR | Status: DC | PRN
  Administered 2020-01-17: 25 ug via INTRAVENOUS

## 2020-01-17 MED ORDER — ACETAMINOPHEN 325 MG PO TABS: 325.0000 mg | ORAL_TABLET | ORAL | Status: DC | PRN

## 2020-01-17 MED ORDER — HEPARIN (PORCINE) IN NACL 1000-0.9 UT/500ML-% IV SOLN
INTRAVENOUS | Status: DC | PRN
  Administered 2020-01-17: 500 mL

## 2020-01-17 MED ORDER — SODIUM CHLORIDE 0.9 % IV SOLN: INTRAVENOUS | Status: DC

## 2020-01-17 SURGICAL SUPPLY — 9 items
CABLE SURGICAL S-101-97-12 (CABLE) ×3 IMPLANT
IPG PACE AZUR XT DR MRI W1DR01 (Pacemaker) IMPLANT
LEAD CAPSURE NOVUS 45CM (Lead) ×2 IMPLANT
LEAD CAPSURE NOVUS 5076-52CM (Lead) ×2 IMPLANT
PACE AZURE XT DR MRI W1DR01 (Pacemaker) ×3 IMPLANT
PAD PRO RADIOLUCENT 2001M-C (PAD) ×3 IMPLANT
SHEATH 7FR PRELUDE SNAP 13 (SHEATH) ×4 IMPLANT
SHEATH PROBE COVER 6X72 (BAG) ×2 IMPLANT
TRAY PACEMAKER INSERTION (PACKS) ×3 IMPLANT

## 2020-01-17 NOTE — Progress Notes (Signed)
Admission history started but not completed, night shift notified admission history needs to be completed.

## 2020-01-17 NOTE — Progress Notes (Signed)
Patient transferred from cath lab at 1840hrs.  Left arm in sling, dressing CDI.  Given post pacemaker instructions, verbalized understanding.

## 2020-01-17 NOTE — Interval H&P Note (Signed)
History and Physical Interval Note:  01/17/2020 2:59 PM  Heather Crawford  has presented today for surgery, with the diagnosis of bradycardia.  The various methods of treatment have been discussed with the patient and family. After consideration of risks, benefits and other options for treatment, the patient has consented to  Procedure(s): PACEMAKER IMPLANT (N/A) as a surgical intervention.  The patient's history has been reviewed, patient examined, no change in status, stable for surgery.  I have reviewed the patient's chart and labs.  Questions were answered to the patient's satisfaction.     Sevilla Murtagh [employer]

## 2020-01-18 ENCOUNTER — Encounter (HOSPITAL_COMMUNITY): Payer: Self-pay | Admitting: Cardiology

## 2020-01-18 ENCOUNTER — Ambulatory Visit (HOSPITAL_COMMUNITY): Payer: Medicare HMO

## 2020-01-18 DIAGNOSIS — I442 Atrioventricular block, complete: Secondary | ICD-10-CM

## 2020-01-18 DIAGNOSIS — I350 Nonrheumatic aortic (valve) stenosis: Secondary | ICD-10-CM | POA: Diagnosis not present

## 2020-01-18 DIAGNOSIS — R55 Syncope and collapse: Secondary | ICD-10-CM | POA: Diagnosis not present

## 2020-01-18 DIAGNOSIS — Z87891 Personal history of nicotine dependence: Secondary | ICD-10-CM | POA: Diagnosis not present

## 2020-01-18 DIAGNOSIS — Z888 Allergy status to other drugs, medicaments and biological substances status: Secondary | ICD-10-CM | POA: Diagnosis not present

## 2020-01-18 DIAGNOSIS — I700 Atherosclerosis of aorta: Secondary | ICD-10-CM | POA: Diagnosis not present

## 2020-01-18 DIAGNOSIS — Z880 Allergy status to penicillin: Secondary | ICD-10-CM | POA: Diagnosis not present

## 2020-01-18 DIAGNOSIS — Z881 Allergy status to other antibiotic agents status: Secondary | ICD-10-CM | POA: Diagnosis not present

## 2020-01-18 DIAGNOSIS — Z951 Presence of aortocoronary bypass graft: Secondary | ICD-10-CM | POA: Diagnosis not present

## 2020-01-18 DIAGNOSIS — J90 Pleural effusion, not elsewhere classified: Secondary | ICD-10-CM | POA: Diagnosis not present

## 2020-01-18 MED ORDER — ASPIRIN 325 MG PO TBEC: 325.0000 mg | DELAYED_RELEASE_TABLET | Freq: Every day | ORAL | Status: DC

## 2020-01-18 MED ORDER — ACETAMINOPHEN 325 MG PO TABS: 325.0000 mg | ORAL_TABLET | ORAL | Status: DC | PRN

## 2020-01-18 MED FILL — Lidocaine HCl Local Inj 1%: INTRAMUSCULAR | Qty: 20 | Status: AC

## 2020-01-18 MED FILL — Lidocaine HCl Local Inj 1%: INTRAMUSCULAR | Qty: 40 | Status: AC

## 2020-01-18 NOTE — Discharge Summary (Addendum)
ELECTROPHYSIOLOGY PROCEDURE DISCHARGE SUMMARY    Patient ID: Heather Crawford,  MRN: [id number], DOB/AGE: 1945/02/27 74 y.o.  Admit date: 01/17/2020 Discharge date: 01/18/2020  Primary Care Physician: Janith Lima, MD  Primary Cardiologist: Sherren Mocha, MD  Electrophysiologist: Constance Haw, MD   Primary Discharge Diagnosis:  Intermittent heart block status post pacemaker implantation this admission Near syncope  Secondary Discharge Diagnosis:  HTN HLD CAD s/p CABG 8527 Chronic diastolic CHF  Allergies  Allergen Reactions   Crestor [Rosuvastatin] Other (See Comments)    myalgia   Lipitor [Atorvastatin] Other (See Comments)    myalgia   Sulfamethoxazole-Trimethoprim Hives   Tape Hives    Breakouts from the adhesive in paper tape and patches   Amoxicillin Itching    Did it involve swelling of the face/tongue/throat, SOB, or low BP? No Did it involve sudden or severe rash/hives, skin peeling, or any reaction on the inside of your mouth or nose? No Did you need to seek medical attention at a hospital or doctor's office? No When did it last happen?   per pt long time ago    If all above answers are "NO", may proceed with cephalosporin use.    Neomycin Itching    Topical neomycin    Neosporin [Neomycin-Bacitracin Zn-Polymyx] Other (See Comments)    Caused blistered   Polysporin [Bacitracin-Polymyxin B] Rash     Procedures This Admission:  1.  Implantation of a Medtronic dual chamber PPM on 01/17/2020 by Dr. Curt Bears. The patient received a Medtronic model number [id number] PPM with model number 5076 right atrial lead and 5076 right ventricular lead. There were no immediate post procedure complications. 2.  CXR on 01/18/20 demonstrated no pneumothorax status post device implantation.   Brief HPI: Heather Crawford is a 74 y.o. female was referred to electrophysiology in the outpatient setting for  consideration of PPM implantation.  Past medical history  includes above.  The patient has had symptomatic bradycardia without reversible causes identified.  Risks, benefits, and alternatives to PPM implantation were reviewed with the patient who wished to proceed.   Hospital Course:  The patient was admitted and underwent implantation of a Medtronic dual chamber PPM with details as outlined above.  She was monitored on telemetry overnight which demonstrated appropriate pacing.  Left chest was without hematoma or ecchymosis.  The device was interrogated and found to be functioning normally.  CXR was obtained and demonstrated no pneumothorax status post device implantation.  Wound care, arm mobility, and restrictions were reviewed with the patient.  The patient was examined and considered stable for discharge to home.    Regarding blood thinner therapy, they were instructed to resume their ASA (medication name) on Saturday (date/time).   Physical Exam: Vitals:   01/18/20 0200 01/18/20 0300 01/18/20 0400 01/18/20 0500  BP: 123/60 (!) 127/92 (!) 142/50   Pulse: 90 89 75   Resp: 17 18 18    Temp:    98.7 F (37.1 C)  TempSrc:    Oral  SpO2: 95% 93% 96%   Weight:      Height:        GEN- The patient is well appearing, alert and oriented x 3 today.   HEENT: normocephalic, atraumatic; sclera clear, conjunctiva pink; hearing intact; oropharynx clear; neck supple, no JVP Lymph- no cervical lymphadenopathy Lungs- Clear to ausculation bilaterally, normal work of breathing.  No wheezes, rales, rhonchi Heart- Regular rate and rhythm, no murmurs, rubs or gallops, PMI not laterally displaced  GI- soft, non-tender, non-distended, bowel sounds present, no hepatosplenomegaly Extremities- no clubbing, cyanosis, or edema; DP/PT/radial pulses 2+ bilaterally MS- no significant deformity or atrophy Skin- warm and dry, no rash or lesion, left chest without hematoma/ecchymosis Psych- euthymic mood, full affect Neuro- strength and sensation are intact   Labs:   Lab  Results  Component Value Date   WBC 5.0 01/10/2020   HGB 12.7 01/10/2020   HCT 38.3 01/10/2020   MCV 90 01/10/2020   PLT 214 01/10/2020   No results for input(s): NA, K, CL, CO2, BUN, CREATININE, CALCIUM, PROT, BILITOT, ALKPHOS, ALT, AST, GLUCOSE in the last 168 hours.  Invalid input(s): LABALBU  Discharge Medications:  Allergies as of 01/18/2020       Reactions   Crestor [rosuvastatin] Other (See Comments)   myalgia   Lipitor [atorvastatin] Other (See Comments)   myalgia   Sulfamethoxazole-trimethoprim Hives   Tape Hives   Breakouts from the adhesive in paper tape and patches   Amoxicillin Itching   Did it involve swelling of the face/tongue/throat, SOB, or low BP? No Did it involve sudden or severe rash/hives, skin peeling, or any reaction on the inside of your mouth or nose? No Did you need to seek medical attention at a hospital or doctor's office? No When did it last happen?   per pt long time ago    If all above answers are "NO", may proceed with cephalosporin use.   Neomycin Itching   Topical neomycin   Neosporin [neomycin-bacitracin Zn-polymyx] Other (See Comments)   Caused blistered   Polysporin [bacitracin-polymyxin B] Rash        Medication List     TAKE these medications    acetaminophen 325 MG tablet Commonly known as: TYLENOL Take 1-2 tablets (325-650 mg total) by mouth every 4 (four) hours as needed for mild pain.   aspirin 325 MG EC tablet Take 1 tablet (325 mg total) by mouth daily. Ok to resume Friday per Dr. Curt Bears Start taking on: January 19, 2020 What changed: additional instructions   azithromycin 500 MG tablet Commonly known as: ZITHROMAX Take 500 mg by mouth See admin instructions. Use as directed one (1) hour prior to dental procedures.   Cholecalciferol 50 MCG (2000 UT) Tabs Take 1 tablet (2,000 Units total) by mouth daily.   Fish Oil 1000 MG Caps Take 1,000 mg by mouth daily.   fluticasone 50 MCG/ACT nasal spray Commonly known  as: FLONASE USE 1 SPRAY IN EACH NOSTRIL EVERY DAY What changed: See the new instructions.   furosemide 20 MG tablet Commonly known as: LASIX TAKE 1 TABLET DAILY AS NEEDED FOR EDEMA OR WEIGHT GAIN. TAKE POTASSIUM 10MEQ WHEN YOU TAKE LASIX What changed: See the new instructions.   irbesartan 150 MG tablet Commonly known as: AVAPRO Take 75 mg by mouth in the morning and at bedtime.   meclizine 12.5 MG tablet Commonly known as: ANTIVERT Take 1 tablet (12.5 mg total) by mouth 3 (three) times daily as needed for dizziness.   omeprazole 40 MG capsule Commonly known as: PRILOSEC TAKE 1 CAPSULE EVERY DAY   potassium chloride 10 MEQ tablet Commonly known as: KLOR-CON Take 1 tablet by mouth on the days you take lasix (furosemide)   simvastatin 40 MG tablet Commonly known as: ZOCOR TAKE 1 TABLET AT BEDTIME What changed: when to take this        Disposition:     Follow-up Information     Constance Haw, MD Follow up on 04/22/2020.  Specialty: Cardiology Why: at 145 pm for 3 month pacemaker check Contact information: [place] [address] [address] Follow up on 01/30/2020.   Why: at 12 noon for post pacemaker wound check Contact information: [place] [id number] [phone]                Duration of Discharge Encounter: Greater than 30 minutes including physician time.  Signed, Shirley Friar, PA-C  01/18/2020 8:55 AM  I have seen and examined this patient with Oda Kilts.  Agree with above, note added to reflect my findings.  On exam, RRR, no murmurs.  She is now status post Medtronic dual-chamber pacemaker for syncope and intermittent complete heart block.  Device functioning appropriately.  Chest x-ray and interrogation without issue.  Plan for discharge today with follow-up in device clinic.  Harue Pribble M. Maverick Patman  MD 01/18/2020 8:57 AM

## 2020-01-18 NOTE — Plan of Care (Signed)

## 2020-01-22 ENCOUNTER — Telehealth: Payer: Self-pay | Admitting: Internal Medicine

## 2020-01-22 NOTE — Progress Notes (Signed)
  Chronic Care Management   Note  01/22/2020 Name: Heather Crawford MRN: [id number] DOB: 1945/08/26  Heather Crawford is a 74 y.o. year old female who is a primary care patient of Janith Lima, MD. I reached out to [name] by phone today in response to a referral sent by [name]'s PCP, Janith Lima, MD.   [name] was given information about Chronic Care Management services today including:  1. CCM service includes personalized support from designated clinical staff supervised by her physician, including individualized plan of care and coordination with other care providers 2. 24/7 contact phone numbers for assistance for urgent and routine care needs. 3. Service will only be billed when office clinical staff spend 20 minutes or more in a month to coordinate care. 4. Only one practitioner may furnish and bill the service in a calendar month. 5. The patient may stop CCM services at any time (effective at the end of the month) by phone call to the office staff.   Patient wishes to consider information provided and/or speak with a member of the care team before deciding about enrollment in care management services.   Follow up plan:   Carley Perdue [employer] Scheduler

## 2020-01-30 ENCOUNTER — Ambulatory Visit (INDEPENDENT_AMBULATORY_CARE_PROVIDER_SITE_OTHER): Payer: Medicare HMO | Admitting: Emergency Medicine

## 2020-01-30 ENCOUNTER — Other Ambulatory Visit: Payer: Self-pay

## 2020-01-30 DIAGNOSIS — Z950 Presence of cardiac pacemaker: Secondary | ICD-10-CM

## 2020-01-30 DIAGNOSIS — I442 Atrioventricular block, complete: Secondary | ICD-10-CM | POA: Diagnosis not present

## 2020-01-31 LAB — CUP PACEART INCLINIC DEVICE CHECK
Battery Remaining Longevity: 181 mo
Battery Voltage: 3.23 V
Brady Statistic AP VP Percent: 0.05 %
Brady Statistic AP VS Percent: 5.02 %
Brady Statistic AS VP Percent: 0.05 %
Brady Statistic AS VS Percent: 94.88 %
Brady Statistic RA Percent Paced: 5.77 %
Brady Statistic RV Percent Paced: 0.1 %
Date Time Interrogation Session: 20211228122600
Implantable Lead Implant Date: 20211215
Implantable Lead Implant Date: 20211215
Implantable Lead Location: 753859
Implantable Lead Location: 753860
Implantable Lead Model: 5076
Implantable Lead Model: 5076
Implantable Pulse Generator Implant Date: 20211215
Lead Channel Impedance Value: 323 Ohm
Lead Channel Impedance Value: 380 Ohm
Lead Channel Impedance Value: 589 Ohm
Lead Channel Impedance Value: 646 Ohm
Lead Channel Pacing Threshold Amplitude: 0.5 V
Lead Channel Pacing Threshold Amplitude: 0.75 V
Lead Channel Pacing Threshold Pulse Width: 0.4 ms
Lead Channel Pacing Threshold Pulse Width: 0.4 ms
Lead Channel Sensing Intrinsic Amplitude: 18.625 mV
Lead Channel Sensing Intrinsic Amplitude: 2.5 mV
Lead Channel Setting Pacing Amplitude: 3.5 V
Lead Channel Setting Pacing Amplitude: 3.5 V
Lead Channel Setting Pacing Pulse Width: 0.4 ms
Lead Channel Setting Sensing Sensitivity: 1.2 mV

## 2020-01-31 NOTE — Progress Notes (Signed)
Wound check appointment. Steri-strips removed. Wound without redness or edema. Incision edges approximated, wound well healed. Normal device function. Thresholds, sensing, and impedances consistent with implant measurements. Device programmed at 3.5V/auto capture programmed on for extra safety margin until 3 month visit. Histogram distribution appropriate for patient and level of activity. No mode switches or high ventricular rates noted. Patient educated about wound care, arm mobility, lifting restrictions. ROV with Dr Elberta Fortis 04/23/19. Enrolled in remote follow-up and next remote 04/18/20.

## 2020-02-06 NOTE — Progress Notes (Unsigned)
Cardiology Office Note:    Date:  02/07/2020   ID:  Heather Crawford 11/17/1945, MRN [id number]  PCP:  Janith Lima, MD  [facility] Cardiologist:  Sherren Mocha, MD   [facility] Electrophysiologist:  Will Meredith Leeds, MD   Referring MD: Janith Lima, MD   Chief Complaint:  Follow-up (CAD, history of AVR, CHF)    Patient Profile:    Heather Crawford is a 75 y.o. female with:   Valvular heart disease, Rheumatic Fever as a child ? Aortic stenosis, s/p bioprosthetic AVR in 2012 ? Echo 3/21: EF 65-70, Gr 2 DD, normally functioning AVR  Mitral regurgitation ? Echo 3/21: Mild-moderate MR  Coronary artery disease  ? S/p CABG x 1 in 2012 [facility]) ? Myoview 3/21: No ischemia, EF 68; low risk  Diastolic CHF ? Echo 3/21: EF 65-70, GRII DD  Carotid artery disease  LBBB  Symptomatic bradycardia  S/p Pacemaker in 12/21  Vertigo 2/2 labyrinthitis   GERD  Impaired glucose tolerance   Prior CV studies: Event monitor 12/19/2019  1. The basic rhythm is normal sinus 2. There is no atrial fibrillation or flutter 3. There are sinus pauses and AV block with ventricular escape, 3.3 - 4 second pauses associated with symptoms of near-syncope  Carotid US 11/16/2019 Bilateral ICA 1-39; left subclavian stenosis  Myoview 05/03/2019 EF 68, small fixed apical defect, no ischemia, low risk  Echocardiogram 05/03/2019 EF 65-70, no RWMA, GRII DD, normal RVSF, RVSP 28.8, mild-moderate MR, s/p AVR -normally functioning, no PVL  Echocardiogram 10/14/2016 Mild LVH, EF 60-65,Normally functioning bioprosthetic aortic valve replacement(mean 14, peak 26), mild MR, moderate LAE  Event monitor 07/21/2016 Sinus rhythm, average heart rate 69, no significant arrhythmia; intermittent IVCD  Echocardiogram 10/10/2015 EF 60-65, normal wall motion, grade 1 diastolic dysfunction, normally functioning bioprosthetic aortic valve replacement, mild MR, normal RV SF, PASP  27  Carotid US 10/20/2015 Bilateral ICA 1-39  Cardiac catheterization 04/03/2010 EF 55 RCA ostial 40-50, distal 90-95 LM normal LCx normal LAD mid 30 >>CABG  History of Present Illness:    Heather Crawford was last seen in 10/21 with symptoms of near syncope.  An event monitor demonstrated intermittent heart block.  She was seen by Dr. Curt Bears recommended proceeding with pacemaker implantation she underwent dual-chamber pacemaker implantation 01/17/2020.  She returns for general cardiology follow-up.  She is still sore from her pacemaker implantation.  She has not had syncope.  She has had a couple of episodes of dizziness that quickly resolved.  She continues to have shortness of breath with exertion with associated jaw discomfort.  She has had this for a long time without significant change.  We did a Myoview in March 2021 that was low risk and negative for ischemia.  She had single-vessel disease prior to her one-vessel bypass.  She has noted 3 pound weight gain recently as well as needed to sleep on an incline.  She only takes furosemide as needed.  Past Medical History:  Diagnosis Date  . Allergic rhinitis, cause unspecified   . Aortic stenosis    a.  echo 1/12 w/mildly dilated LV, EF 45-50% w/paradoxical septal motion consistent w/ LBBB, moderate aortic stenosis w/mean gradient 27 mmHg, trivial AI, mild to moderate MR w/calcified mitral valve;   b. s/p AVR with 21 mm Magna Ease valve  . Aortic valve replaced 2012  . Arthritis    joint pain  . CAD (coronary artery disease)    a. s/p CABG 4/12: RIMA-RCA  .  CAD, multiple vessel    sees Dr Shirlee Latch every 3-4 months  . Cancer (HCC)    skin sarcomas removed  . Carotid artery disease (HCC)    Carotid US 10/21: Bilateral ICA 1-39; left subclavian stenosis  . CHF (congestive heart failure) (HCC)    chronic diastolic CHF  . Chronic back pain    H/O BACK SURGERY  . Coronary artery disease    adenosine myoview 2/12 w/EF 56%, ISCHEMIA W/SCAR  IN THE MID TO APICAL ANTERIO WALL, SEPTAL WALL, AND APEX. LHC 3/12 W/EF 55%, 40% OSTIAL RCA, 60% MID RCA, 95% DISTAL RC A, INTERVENTION  ATTEMPTED BUT UNABLE TO ADEQUADATELY SEAT  CATHETER.  . Diverticulitis of colon (without mention of hemorrhage)(562.11)   . Diverticulosis   . Esophageal reflux   . Glucose intolerance (impaired glucose tolerance)    A1C 6.3 (4/12)  . H/O hiatal hernia   . H/O vertigo    with fluid  in ears  . Heart murmur   . Hemorrhoids   . Hemorrhoids   . History of rheumatic fever   . Hx of hysterectomy   . Hx: UTI (urinary tract infection)   . Hyperlipidemia   . LBBB (left bundle branch block)   . Myocardial infarction (HCC) 2002  . Pneumonia   . PONV (postoperative nausea and vomiting)   . 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 Snoring   . Squamous cell carcinoma of skin    nose  . Urinary frequency     Current Medications: Current Meds  Medication Sig  . acetaminophen (TYLENOL) 325 MG tablet Take 1-2 tablets (325-650 mg total) by mouth every 4 (four) hours as needed for mild pain.  Marland Kitchen aspirin 325 MG EC tablet Take 1 tablet (325 mg total) by mouth daily. Ok to resume Friday per Dr. Elberta Fortis  . azithromycin (ZITHROMAX) 500 MG tablet Take 500 mg by mouth See admin instructions. Use as directed one (1) hour prior to dental procedures.  . Cholecalciferol 2000 units TABS Take 1 tablet (2,000 Units total) by mouth daily.  . fluticasone (FLONASE) 50 MCG/ACT nasal spray USE 1 SPRAY IN EACH NOSTRIL EVERY DAY  . furosemide (LASIX) 20 MG tablet TAKE 1 TABLET DAILY AS NEEDED FOR EDEMA OR WEIGHT GAIN. TAKE POTASSIUM WHEN YOU TAKE LASIX  . irbesartan (AVAPRO) 150 MG tablet Take 75 mg by mouth in the morning and at bedtime.  . isosorbide mononitrate (IMDUR) 30 MG 24 hr tablet Take 1 tablet (30 mg total) by mouth daily.  . meclizine (ANTIVERT) 12.5 MG tablet Take 1 tablet (12.5 mg total) by mouth 3 (three) times daily as needed for dizziness.  . Omega-3 Fatty Acids (FISH  OIL) 1000 MG CAPS Take 1,000 mg by mouth daily.  Marland Kitchen omeprazole (PRILOSEC) 40 MG capsule TAKE 1 CAPSULE EVERY DAY  . potassium chloride (K-DUR,KLOR-CON) 10 MEQ tablet Take 1 tablet by mouth on the days you take lasix (furosemide)  . simvastatin (ZOCOR) 40 MG tablet TAKE 1 TABLET AT BEDTIME     Allergies:   Crestor [rosuvastatin], Lipitor [atorvastatin], Sulfamethoxazole-trimethoprim, Tape, Amoxicillin, Neomycin, Neosporin [neomycin-bacitracin zn-polymyx], and Polysporin [bacitracin-polymyxin b]   Social History   Tobacco Use  . Smoking status: Former Smoker    Packs/day: 1.00    Years: 40.00    Pack years: 40.00    Types: Cigarettes    Quit date: 02/03/2000    Years since quitting: 20.0  . Smokeless tobacco: Former Neurosurgeon    Types: Chew  .  Tobacco comment: discussed LDCT   Vaping Use  . Vaping Use: Never used  Substance Use Topics  . Alcohol use: No    Alcohol/week: 0.0 standard drinks  . Drug use: No     Family Hx: The patient's family history includes Breast cancer in her maternal aunt, mother, paternal grandmother, and sister; CAD in her father; Colon cancer in her maternal aunt and paternal uncle; Diabetes type II in her mother; Heart attack in her father; Heart disease in her mother; Hypertension in her mother; Hypothyroidism in her mother; Pancreatic cancer in an other family member. There is no history of Stomach cancer, Throat cancer, or Esophageal cancer.  ROS   EKGs/Labs/Other Test Reviewed:    EKG:  EKG is  not ordered today.  The ekg ordered today demonstrates n/a  Recent Labs: 06/20/2019: ALT 20; TSH 5.33 01/10/2020: BUN 14; Creatinine, Ser 0.84; Hemoglobin 12.7; Platelets 214; Potassium 5.1; Sodium 137   Recent Lipid Panel Lab Results  Component Value Date/Time   CHOL 134 06/20/2019 09:27 AM   CHOL 150 01/06/2018 11:13 AM   TRIG 216.0 (H) 06/20/2019 09:27 AM   HDL 36.40 (L) 06/20/2019 09:27 AM   HDL 42 01/06/2018 11:13 AM   CHOLHDL 4 06/20/2019 09:27 AM    LDLCALC 61 01/06/2018 11:13 AM   LDLDIRECT 69.0 06/20/2019 09:27 AM      Risk Assessment/Calculations:      Physical Exam:    VS:  BP 132/72   Pulse 70   Ht 5\' 3"  (1.6 m)   Wt 177 lb 3.2 oz (80.4 kg)   SpO2 97%   BMI 31.39 kg/m     Wt Readings from Last 3 Encounters:  02/07/20 177 lb 3.2 oz (80.4 kg)  01/17/20 176 lb 5.9 oz (80 kg)  12/19/19 174 lb (78.9 kg)     Constitutional:      Appearance: Healthy appearance. Not in distress.  Neck:     Vascular: No JVR. JVD normal.  Pulmonary:     Effort: Pulmonary effort is normal.     Breath sounds: No wheezing. No rales.  Cardiovascular:     Normal rate. Regular rhythm. Normal S1. Normal S2.     Murmurs: There is a grade 2/6 systolic murmur at the URSB.  Edema:    Pretibial: bilateral trace edema of the pretibial area. Abdominal:     Palpations: Abdomen is soft. There is no hepatomegaly.  Skin:    General: Skin is warm and dry.  Neurological:     Mental Status: Alert and oriented to person, place and time.     Cranial Nerves: Cranial nerves are intact.       ASSESSMENT & PLAN:    1. Coronary artery disease involving native coronary artery of native heart with angina pectoris ([place]) S/p CABG x1 in 2012.  Myoview 3/21 low risk and negative for ischemia.  She continues to have exertional shortness of breath and jaw discomfort.  I suspect she may have microvascular ischemia.  Therefore, I recommend starting on isosorbide 30 mg daily.  Continue aspirin, statin.  Follow-up in 3 months.  2. Chronic heart failure with preserved ejection fraction ([place]) She has noted a 3 pound weight gain recently as well as some swelling and increased shortness of breath with lying down.  I have asked her to go ahead and take furosemide as directed.  3. S/P AVR (aortic valve replacement) Normally functioning AVR by echocardiogram March 2021.  Continue SBE prophylaxis.  4. Essential hypertension Blood  pressure somewhat borderline.  I am  starting isosorbide as outlined above.  This should help bring her pressure to goal.  5. Bilateral carotid artery stenosis Mild bilateral ICA stenosis by recent carotid Dopplers.  She does have left subclavian stenosis.  She does not have any symptoms of steal.  6. Pacemaker Follow-up with EP as planned.     Dispo:  Return in about 3 months (around 05/07/2020) for Routine Follow Up, w/ Dr. Burt Knack, or Richardson Dopp, PA-C, in person.   Medication Adjustments/Labs and Tests Ordered: Current medicines are reviewed at length with the patient today.  Concerns regarding medicines are outlined above.  Tests Ordered: No orders of the defined types were placed in this encounter.  Medication Changes: Meds ordered this encounter  Medications  . isosorbide mononitrate (IMDUR) 30 MG 24 hr tablet    Sig: Take 1 tablet (30 mg total) by mouth daily.    Dispense:  90 tablet    Refill:  3    Signed, Richardson Dopp, PA-C  02/07/2020 8:47 AM    [facility] [address], [place], [place]  [address] Phone: [phone]; Fax: [phone]

## 2020-02-07 ENCOUNTER — Other Ambulatory Visit: Payer: Self-pay

## 2020-02-07 ENCOUNTER — Encounter: Payer: Self-pay | Admitting: Physician Assistant

## 2020-02-07 ENCOUNTER — Ambulatory Visit: Payer: Medicare HMO | Admitting: Physician Assistant

## 2020-02-07 VITALS — BP 132/72 | HR 70 | Ht 63.0 in | Wt 177.2 lb

## 2020-02-07 DIAGNOSIS — I5032 Chronic diastolic (congestive) heart failure: Secondary | ICD-10-CM | POA: Diagnosis not present

## 2020-02-07 DIAGNOSIS — Z950 Presence of cardiac pacemaker: Secondary | ICD-10-CM

## 2020-02-07 DIAGNOSIS — I6523 Occlusion and stenosis of bilateral carotid arteries: Secondary | ICD-10-CM

## 2020-02-07 DIAGNOSIS — I10 Essential (primary) hypertension: Secondary | ICD-10-CM | POA: Diagnosis not present

## 2020-02-07 DIAGNOSIS — Z952 Presence of prosthetic heart valve: Secondary | ICD-10-CM

## 2020-02-07 DIAGNOSIS — I25119 Atherosclerotic heart disease of native coronary artery with unspecified angina pectoris: Secondary | ICD-10-CM

## 2020-02-07 MED ORDER — ISOSORBIDE MONONITRATE ER 30 MG PO TB24: 30.0000 mg | ORAL_TABLET | Freq: Every day | ORAL | 3 refills | Status: DC

## 2020-02-07 NOTE — Addendum Note (Signed)
Addended by: Lajoyce Corners on: 02/07/2020 03:21 PM   Modules accepted: Orders

## 2020-02-07 NOTE — Patient Instructions (Signed)
Medication Instructions:  Your physician has recommended you make the following change in your medication:   1) Start Isosorbide 30 mg, 1 tablet by mouth once a day  *If you need a refill on your cardiac medications before your next appointment, please call your pharmacy*  Lab Work: None ordered today  Testing/Procedures: None ordered today  Follow-Up: At [facility], you and your health needs are our priority.  As part of our continuing mission to provide you with exceptional heart care, we have created designated Provider Care Teams.  These Care Teams include your primary Cardiologist (physician) and Advanced Practice Providers (APPs -  Physician Assistants and Nurse Practitioners) who all work together to provide you with the care you need, when you need it.  Your next appointment:   3 month(s) on 05/08/20 at 2:45PM   The format for your next appointment:   In Person  Provider:   Tereso Newcomer, PA-C

## 2020-02-22 ENCOUNTER — Telehealth: Payer: Self-pay | Admitting: Internal Medicine

## 2020-02-22 NOTE — Progress Notes (Signed)
  Chronic Care Management   Note  02/22/2020 Name: [name] MRN: [id number] DOB: May 14, 1945  Heather Crawford is a 75 y.o. year old female who is a primary care patient of Janith Lima, MD. I reached out to [name] by phone today in response to a referral sent by [name]'s PCP, Janith Lima, MD.   [name] was given information about Chronic Care Management services today including:  1. CCM service includes personalized support from designated clinical staff supervised by her physician, including individualized plan of care and coordination with other care providers 2. 24/7 contact phone numbers for assistance for urgent and routine care needs. 3. Service will only be billed when office clinical staff spend 20 minutes or more in a month to coordinate care. 4. Only one practitioner may furnish and bill the service in a calendar month. 5. The patient may stop CCM services at any time (effective at the end of the month) by phone call to the office staff.   Patient did not agree to enrollment in care management services and does not wish to consider at this time.  Follow up plan:   Carley Perdue [employer] Scheduler

## 2020-02-27 DIAGNOSIS — Z780 Asymptomatic menopausal state: Secondary | ICD-10-CM | POA: Diagnosis not present

## 2020-02-27 DIAGNOSIS — M85851 Other specified disorders of bone density and structure, right thigh: Secondary | ICD-10-CM | POA: Diagnosis not present

## 2020-02-27 LAB — HM DEXA SCAN
HM Dexa Scan: -1.3
HM Dexa Scan: -1.3

## 2020-03-07 ENCOUNTER — Other Ambulatory Visit: Payer: Self-pay | Admitting: Gastroenterology

## 2020-03-22 ENCOUNTER — Other Ambulatory Visit: Payer: Self-pay | Admitting: Internal Medicine

## 2020-04-18 ENCOUNTER — Ambulatory Visit (INDEPENDENT_AMBULATORY_CARE_PROVIDER_SITE_OTHER): Payer: Medicare HMO

## 2020-04-18 DIAGNOSIS — I442 Atrioventricular block, complete: Secondary | ICD-10-CM

## 2020-04-18 LAB — CUP PACEART REMOTE DEVICE CHECK
Battery Remaining Longevity: 182 mo
Battery Voltage: 3.22 V
Brady Statistic AP VP Percent: 0.02 %
Brady Statistic AP VS Percent: 5.45 %
Brady Statistic AS VP Percent: 0.05 %
Brady Statistic AS VS Percent: 94.48 %
Brady Statistic RA Percent Paced: 5.52 %
Brady Statistic RV Percent Paced: 0.07 %
Date Time Interrogation Session: 20220316194009
Implantable Lead Implant Date: 20211215
Implantable Lead Implant Date: 20211215
Implantable Lead Location: 753859
Implantable Lead Location: 753860
Implantable Lead Model: 5076
Implantable Lead Model: 5076
Implantable Pulse Generator Implant Date: 20211215
Lead Channel Impedance Value: 285 Ohm
Lead Channel Impedance Value: 342 Ohm
Lead Channel Impedance Value: 570 Ohm
Lead Channel Impedance Value: 665 Ohm
Lead Channel Pacing Threshold Amplitude: 0.5 V
Lead Channel Pacing Threshold Amplitude: 0.5 V
Lead Channel Pacing Threshold Pulse Width: 0.4 ms
Lead Channel Pacing Threshold Pulse Width: 0.4 ms
Lead Channel Sensing Intrinsic Amplitude: 0.875 mV
Lead Channel Sensing Intrinsic Amplitude: 0.875 mV
Lead Channel Sensing Intrinsic Amplitude: 17.875 mV
Lead Channel Sensing Intrinsic Amplitude: 17.875 mV
Lead Channel Setting Pacing Amplitude: 1.5 V
Lead Channel Setting Pacing Amplitude: 2 V
Lead Channel Setting Pacing Pulse Width: 0.4 ms
Lead Channel Setting Sensing Sensitivity: 1.2 mV

## 2020-04-22 ENCOUNTER — Ambulatory Visit (INDEPENDENT_AMBULATORY_CARE_PROVIDER_SITE_OTHER): Payer: Medicare HMO | Admitting: Cardiology

## 2020-04-22 ENCOUNTER — Other Ambulatory Visit: Payer: Self-pay

## 2020-04-22 ENCOUNTER — Encounter: Payer: Self-pay | Admitting: Cardiology

## 2020-04-22 VITALS — BP 128/68 | HR 84 | Ht 63.0 in | Wt 176.0 lb

## 2020-04-22 DIAGNOSIS — R55 Syncope and collapse: Secondary | ICD-10-CM

## 2020-04-22 NOTE — Patient Instructions (Signed)
Medication Instructions:  Your physician recommends that you continue on your current medications as directed. Please refer to the Current Medication list given to you today.  *If you need a refill on your cardiac medications before your next appointment, please call your pharmacy*   Lab Work: None ordered   Testing/Procedures: None ordered   Follow-Up: At [facility], you and your health needs are our priority.  As part of our continuing mission to provide you with exceptional heart care, we have created designated Provider Care Teams.  These Care Teams include your primary Cardiologist (physician) and Advanced Practice Providers (APPs -  Physician Assistants and Nurse Practitioners) who all work together to provide you with the care you need, when you need it.  Remote monitoring is used to monitor your Pacemaker or ICD from home. This monitoring reduces the number of office visits required to check your device to one time per year. It allows [place] to keep an eye on the functioning of your device to ensure it is working properly. You are scheduled for a device check from home on 07/18/20. You may send your transmission at any time that day. If you have a wireless device, the transmission will be sent automatically. After your physician reviews your transmission, you will receive a postcard with your next transmission date.  Your next appointment:   9 month(s)  The format for your next appointment:   In Person  Provider:   Allegra Lai, MD   Thank you for choosing [facility]!!   Trinidad Curet, RN [phone]

## 2020-04-22 NOTE — Progress Notes (Signed)
Electrophysiology Office Note   Date:  04/22/2020   ID:  Tekla, Malachowski 1945-03-18, MRN [id number]  PCP:  Janith Lima, MD  Cardiologist:  Burt Knack Primary Electrophysiologist:  Kezia Benevides Meredith Leeds, MD    Chief Complaint: Near syncope   History of Present Illness: ARISBETH PURRINGTON is a 75 y.o. female who is being seen today for the evaluation of near syncope at the request of Janith Lima, MD. Presenting today for electrophysiology evaluation.  History of aortic stenosis status post AVR, coronary artery disease status post CABG in 7829, diastolic heart failure, left bundle branch block.  She had been having episodes of dizziness and near syncope.  She wore a cardiac monitor that showed multiple daytime pauses.  She is now status post Medtronic dual-chamber pacemaker implanted 01/17/2020.  Today, denies symptoms of palpitations, chest pain, shortness of breath, orthopnea, PND, lower extremity edema, claudication, dizziness, presyncope, syncope, bleeding, or neurologic sequela. The patient is tolerating medications without difficulties.  Since her pacemaker was implanted she has done well.  She has no chest pain or shortness of breath.  She is able to do all her daily activities.  She has had no further episodes of syncope or near syncope.  Her only issue is her vertigo.   Past Medical History:  Diagnosis Date  . Allergic rhinitis, cause unspecified   . Aortic stenosis    a.  echo 1/12 w/mildly dilated LV, EF 45-50% w/paradoxical septal motion consistent w/ LBBB, moderate aortic stenosis w/mean gradient 27 mmHg, trivial AI, mild to moderate MR w/calcified mitral valve;   b. s/p AVR with 21 mm Magna Ease valve  . Aortic valve replaced 2012  . Arthritis    joint pain  . CAD (coronary artery disease)    a. s/p CABG 4/12: RIMA-RCA  . CAD, multiple vessel    sees Dr Aundra Dubin every 3-4 months  . Cancer ([place])    skin sarcomas removed  . Carotid artery disease ([place])     Carotid US 10/21: Bilateral ICA 1-39; left subclavian stenosis  . CHF (congestive heart failure) ([facility])    chronic diastolic CHF  . Chronic back pain    H/O BACK SURGERY  . Coronary artery disease    adenosine myoview 2/12 w/EF 56%, ISCHEMIA W/SCAR IN THE MID TO APICAL ANTERIO WALL, SEPTAL WALL, AND APEX. LHC 3/12 W/EF 55%, 40% OSTIAL RCA, 60% MID RCA, 95% DISTAL RC A, INTERVENTION  ATTEMPTED BUT UNABLE TO ADEQUADATELY SEAT  CATHETER.  . Diverticulitis of colon (without mention of hemorrhage)(562.11)   . Diverticulosis   . Esophageal reflux   . Glucose intolerance (impaired glucose tolerance)    A1C 6.3 (4/12)  . H/O hiatal hernia   . H/O vertigo    with fluid  in ears  . Heart murmur   . Hemorrhoids   . Hemorrhoids   . History of rheumatic fever   . Hx of hysterectomy   . Hx: UTI (urinary tract infection)   . Hyperlipidemia   . LBBB (left bundle branch block)   . Myocardial infarction ([place]) 2002  . Pneumonia   . PONV (postoperative nausea and vomiting)   . 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 Snoring   . Squamous cell carcinoma of skin    nose  . Urinary frequency    Past Surgical History:  Procedure Laterality Date  . ABDOMINAL HYSTERECTOMY    . APPENDECTOMY  1973  . BACK SURGERY  Lumbar Lam and discectomy x 2  . BREAST BIOPSY Right 10/2011  . BREAST LUMPECTOMY Right   . CARDIAC CATHETERIZATION  2012  . CARDIAC VALVE REPLACEMENT  2012   Aortic  . CORONARY ARTERY BYPASS GRAFT  2012  . DILATION AND CURETTAGE OF UTERUS    . ENDOMETRIAL ABLATION    . LAPAROSCOPIC LYSIS INTESTINAL ADHESIONS    . LUMBAR [place]  . PACEMAKER IMPLANT N/A 01/17/2020   Procedure: PACEMAKER IMPLANT;  Surgeon: Constance Haw, MD;  Location: [facility];  Service: Cardiovascular;  Laterality: N/A;  . SKIN SURGERY     skin sarcomas removed  . TONSILLECTOMY     1955  . TONSILLECTOMY       Current Outpatient Medications  Medication Sig Dispense  Refill  . acetaminophen (TYLENOL) 325 MG tablet Take 1-2 tablets (325-650 mg total) by mouth every 4 (four) hours as needed for mild pain.    Marland Kitchen aspirin 325 MG EC tablet Take 1 tablet (325 mg total) by mouth daily. Ok to resume Friday per Dr. Curt Bears    . azithromycin (ZITHROMAX) 500 MG tablet Take 500 mg by mouth See admin instructions. Use as directed one (1) hour prior to dental procedures.    . Cholecalciferol 2000 units TABS Take 1 tablet (2,000 Units total) by mouth daily. 90 tablet 1  . fluticasone (FLONASE) 50 MCG/ACT nasal spray USE 1 SPRAY IN EACH NOSTRIL EVERY DAY 32 g 11  . furosemide (LASIX) 20 MG tablet TAKE 1 TABLET DAILY AS NEEDED FOR EDEMA OR WEIGHT GAIN. TAKE POTASSIUM 10MEQ WHEN YOU TAKE LASIX 90 tablet 2  . irbesartan (AVAPRO) 150 MG tablet Take 75 mg by mouth in the morning and at bedtime.    . isosorbide mononitrate (IMDUR) 30 MG 24 hr tablet Take 1 tablet (30 mg total) by mouth daily. 90 tablet 3  . meclizine (ANTIVERT) 12.5 MG tablet Take 1 tablet (12.5 mg total) by mouth 3 (three) times daily as needed for dizziness. 90 tablet 3  . Omega-3 Fatty Acids (FISH OIL) 1000 MG CAPS Take 1,000 mg by mouth daily.    Marland Kitchen omeprazole (PRILOSEC) 40 MG capsule TAKE 1 CAPSULE EVERY DAY 90 capsule 0  . potassium chloride (K-DUR,KLOR-CON) 10 MEQ tablet Take 1 tablet by mouth on the days you take lasix (furosemide) 90 tablet 3  . simvastatin (ZOCOR) 40 MG tablet TAKE 1 TABLET AT BEDTIME 90 tablet 2   No current facility-administered medications for this visit.    Allergies:   Crestor [rosuvastatin], Lipitor [atorvastatin], Sulfamethoxazole-trimethoprim, Tape, Amoxicillin, Neomycin, Neosporin [neomycin-bacitracin zn-polymyx], and Polysporin [bacitracin-polymyxin b]   Social History:  The patient  reports that she quit smoking about 20 years ago. Her smoking use included cigarettes. She has a 40.00 pack-year smoking history. She has quit using smokeless tobacco.  Her smokeless tobacco use  included chew. She reports that she does not drink alcohol and does not use drugs.   Family History:  The patient's family history includes Breast cancer in her maternal aunt, mother, paternal grandmother, and sister; CAD in her father; Colon cancer in her maternal aunt and paternal uncle; Diabetes type II in her mother; Heart attack in her father; Heart disease in her mother; Hypertension in her mother; Hypothyroidism in her mother; Pancreatic cancer in an other family member.   ROS:  Please see the history of present illness.   Otherwise, review of systems is positive for none.  All other systems are reviewed and negative.   PHYSICAL EXAM: VS:  BP 128/68   Pulse 84   Ht 5\' 3"  (1.6 m)   Wt 176 lb (79.8 kg)   SpO2 96%   BMI 31.18 kg/m  , BMI Body mass index is 31.18 kg/m. GEN: Well nourished, well developed, in no acute distress  HEENT: normal  Neck: no JVD, carotid bruits, or masses Cardiac: RRR; no murmurs, rubs, or gallops,no edema  Respiratory:  clear to auscultation bilaterally, normal work of breathing GI: soft, nontender, nondistended, + BS MS: no deformity or atrophy  Skin: warm and dry, device site well healed Neuro:  Strength and sensation are intact Psych: euthymic mood, full affect  EKG:  EKG is ordered today. Personal review of the ekg ordered shows sinus rhythm, left bundle branch block  Personal review of the device interrogation today. Results in [facility]: 06/20/2019: ALT 20; TSH 5.33 01/10/2020: BUN 14; Creatinine, Ser 0.84; Hemoglobin 12.7; Platelets 214; Potassium 5.1; Sodium 137    Lipid Panel     Component Value Date/Time   CHOL 134 06/20/2019 0927   CHOL 150 01/06/2018 1113   TRIG 216.0 (H) 06/20/2019 0927   HDL 36.40 (L) 06/20/2019 0927   HDL 42 01/06/2018 1113   CHOLHDL 4 06/20/2019 0927   VLDL 43.2 (H) 06/20/2019 0927   LDLCALC 61 01/06/2018 1113   LDLDIRECT 69.0 06/20/2019 0927     Wt Readings from Last 3 Encounters:  04/22/20  176 lb (79.8 kg)  02/07/20 177 lb 3.2 oz (80.4 kg)  01/17/20 176 lb 5.9 oz (80 kg)      Other studies Reviewed: Additional studies/ records that were reviewed today include: TTE 05/03/19  Review of the above records today demonstrates:  1. Left ventricular ejection fraction, by estimation, is 65 to 70%. The  left ventricle has hyperdynamic function. The left ventricle has no  regional wall motion abnormalities. Left ventricular diastolic parameters  are consistent with Grade II diastolic  dysfunction (pseudonormalization). Elevated left atrial pressure.  2. Right ventricular systolic function is normal. The right ventricular  size is normal. There is normal pulmonary artery systolic pressure. The  estimated right ventricular systolic pressure is 35.3 mmHg.  3. The mitral valve is degenerative. Mild to moderate mitral valve  regurgitation. No evidence of mitral stenosis.  4. The aortic valve has been repaired/replaced. Aortic valve  regurgitation is not visualized. No aortic stenosis is present. There is a  21 mm Magna Ease valve present in the aortic position. Procedure Date:  2012. Echo findings are consistent with normal  structure and function of the aortic valve prosthesis.    ASSESSMENT AND PLAN:  1.  Near syncope with pauses on cardiac monitor: Status post Medtronic dual-chamber pacemaker implanted 01/17/2020.  Device functioning appropriately.  No changes at this time.    2.  Coronary artery disease status post CABG x1: Low risk Myoview.  No current chest pain.  No changes.  3.  Aortic stenosis status post AVR: Normally function by most recent echo.   Current medicines are reviewed at length with the patient today.   The patient does not have concerns regarding her medicines.  The following changes were made today: None  Labs/ tests ordered today include:  Orders Placed This Encounter  Procedures  . EKG 12-Lead     Disposition:   FU with Iris Hairston 9  months  Signed, Naina Sleeper Meredith Leeds, MD  04/22/2020 2:00 PM  [address] [place] [address] [phone] (office) [phone] (fax)

## 2020-04-26 NOTE — Progress Notes (Signed)
Remote pacemaker transmission.   

## 2020-04-30 DIAGNOSIS — H52223 Regular astigmatism, bilateral: Secondary | ICD-10-CM | POA: Diagnosis not present

## 2020-05-01 ENCOUNTER — Other Ambulatory Visit: Payer: Self-pay | Admitting: Cardiovascular Disease

## 2020-05-01 DIAGNOSIS — Z952 Presence of prosthetic heart valve: Secondary | ICD-10-CM

## 2020-05-08 ENCOUNTER — Ambulatory Visit: Payer: Medicare HMO | Admitting: Physician Assistant

## 2020-05-16 ENCOUNTER — Other Ambulatory Visit: Payer: Self-pay | Admitting: Internal Medicine

## 2020-05-16 ENCOUNTER — Telehealth: Payer: Self-pay | Admitting: Internal Medicine

## 2020-05-16 DIAGNOSIS — H40013 Open angle with borderline findings, low risk, bilateral: Secondary | ICD-10-CM | POA: Diagnosis not present

## 2020-05-16 DIAGNOSIS — E119 Type 2 diabetes mellitus without complications: Secondary | ICD-10-CM | POA: Diagnosis not present

## 2020-05-16 DIAGNOSIS — Z298 Encounter for other specified prophylactic measures: Secondary | ICD-10-CM

## 2020-05-16 DIAGNOSIS — Z2989 Encounter for other specified prophylactic measures: Secondary | ICD-10-CM

## 2020-05-16 LAB — HM DIABETES EYE EXAM

## 2020-05-16 MED ORDER — AZITHROMYCIN 500 MG PO TABS: 500.0000 mg | ORAL_TABLET | Freq: Once | ORAL | 1 refills | Status: AC

## 2020-05-16 NOTE — Telephone Encounter (Signed)
  Patient calling to request antibiotic prior to dental procedure on 04/19  She is having 3 extractions

## 2020-06-19 ENCOUNTER — Other Ambulatory Visit: Payer: Self-pay | Admitting: Internal Medicine

## 2020-06-19 ENCOUNTER — Other Ambulatory Visit: Payer: Self-pay

## 2020-06-19 ENCOUNTER — Ambulatory Visit (INDEPENDENT_AMBULATORY_CARE_PROVIDER_SITE_OTHER): Payer: Medicare HMO

## 2020-06-19 VITALS — BP 120/60 | HR 60 | Temp 98.0°F | Ht 63.0 in | Wt 163.6 lb

## 2020-06-19 DIAGNOSIS — Z0000 Encounter for general adult medical examination without abnormal findings: Secondary | ICD-10-CM | POA: Diagnosis not present

## 2020-06-19 DIAGNOSIS — K2100 Gastro-esophageal reflux disease with esophagitis, without bleeding: Secondary | ICD-10-CM

## 2020-06-19 MED ORDER — OMEPRAZOLE 40 MG PO CPDR: 1.0000 | DELAYED_RELEASE_CAPSULE | Freq: Every day | ORAL | 1 refills | Status: DC

## 2020-06-19 NOTE — Progress Notes (Signed)
Subjective:   Heather Crawford is a 75 y.o. female who presents for Medicare Annual (Subsequent) preventive examination.  Review of Systems    No ROS. Medicare Wellness Visit. Additional risk factors are reflected in social history. Cardiac Risk Factors include: advanced age (>36men, >50 women);dyslipidemia;family history of premature cardiovascular disease;hypertension     Objective:    Today's Vitals   06/19/20 1024  BP: 120/60  Pulse: 60  Temp: 98 F (36.7 C)  SpO2: 95%  Weight: 163 lb 9.6 oz (74.2 kg)  Height: 5\' 3"  (1.6 m)  PainSc: 0-No pain   Body mass index is 28.98 kg/m.  Advanced Directives 06/19/2020 01/18/2020 01/17/2020 01/17/2020 05/04/2019 09/12/2018 04/04/2018  Does Patient Have a Medical Advance Directive? Yes Yes - Yes Yes No Yes  Type of Advance Directive Living will;Healthcare Power of [name];Living will [name];Living will [name];Living will [name];Living will - [name];Living will  Does patient want to make changes to medical advance directive? No - Patient declined No - Patient declined - - No - Patient declined - -  Copy of [place] in Chart? No - copy requested No - copy requested No - copy requested - No - copy requested - No - copy requested  Would patient like information on creating a medical advance directive? - - - - - No - Patient declined -  Pre-existing out of facility DNR order (yellow form or pink MOST form) - - - - - - -    Current Medications (verified) Outpatient Encounter Medications as of 06/19/2020  Medication Sig  . acetaminophen (TYLENOL) 325 MG tablet Take 1-2 tablets (325-650 mg total) by mouth every 4 (four) hours as needed for mild pain.  Marland Kitchen aspirin 325 MG EC tablet Take 1 tablet (325 mg total) by mouth daily. Ok to resume Friday per Dr. Curt Bears  . Cholecalciferol 2000 units TABS Take 1 tablet  (2,000 Units total) by mouth daily.  . fluticasone (FLONASE) 50 MCG/ACT nasal spray USE 1 SPRAY IN EACH NOSTRIL EVERY DAY  . furosemide (LASIX) 20 MG tablet TAKE 1 TABLET DAILY AS NEEDED FOR EDEMA OR WEIGHT GAIN. TAKE POTASSIUM 10MEQ WHEN YOU TAKE LASIX  . irbesartan (AVAPRO) 150 MG tablet Take 75 mg by mouth in the morning and at bedtime.  . isosorbide mononitrate (IMDUR) 30 MG 24 hr tablet Take 1 tablet (30 mg total) by mouth daily.  . meclizine (ANTIVERT) 12.5 MG tablet Take 1 tablet (12.5 mg total) by mouth 3 (three) times daily as needed for dizziness.  . Omega-3 Fatty Acids (FISH OIL) 1000 MG CAPS Take 1,000 mg by mouth daily.  Marland Kitchen omeprazole (PRILOSEC) 40 MG capsule TAKE 1 CAPSULE EVERY DAY  . potassium chloride (K-DUR,KLOR-CON) 10 MEQ tablet Take 1 tablet by mouth on the days you take lasix (furosemide)  . simvastatin (ZOCOR) 40 MG tablet TAKE 1 TABLET AT BEDTIME   No facility-administered encounter medications on file as of 06/19/2020.    Allergies (verified) Crestor [rosuvastatin], Lipitor [atorvastatin], Sulfamethoxazole-trimethoprim, Tape, Amoxicillin, Neomycin, Neosporin [neomycin-bacitracin zn-polymyx], and Polysporin [bacitracin-polymyxin b]   History: Past Medical History:  Diagnosis Date  . Allergic rhinitis, cause unspecified   . Aortic stenosis    a.  echo 1/12 w/mildly dilated LV, EF 45-50% w/paradoxical septal motion consistent w/ LBBB, moderate aortic stenosis w/mean gradient 27 mmHg, trivial AI, mild to moderate MR w/calcified mitral valve;   b. s/p AVR with  21 mm Magna Ease valve  . Aortic valve replaced 2012  . Arthritis    joint pain  . CAD (coronary artery disease)    a. s/p CABG 4/12: RIMA-RCA  . CAD, multiple vessel    sees Dr Shirlee Latch every 3-4 months  . Cancer (HCC)    skin sarcomas removed  . Carotid artery disease (HCC)    Carotid US 10/21: Bilateral ICA 1-39; left subclavian stenosis  . CHF (congestive heart failure) (HCC)    chronic diastolic CHF  .  Chronic back pain    H/O BACK SURGERY  . Coronary artery disease    adenosine myoview 2/12 w/EF 56%, ISCHEMIA W/SCAR IN THE MID TO APICAL ANTERIO WALL, SEPTAL WALL, AND APEX. LHC 3/12 W/EF 55%, 40% OSTIAL RCA, 60% MID RCA, 95% DISTAL RC A, INTERVENTION  ATTEMPTED BUT UNABLE TO ADEQUADATELY SEAT  CATHETER.  . Diverticulitis of colon (without mention of hemorrhage)(562.11)   . Diverticulosis   . Esophageal reflux   . Glucose intolerance (impaired glucose tolerance)    A1C 6.3 (4/12)  . H/O hiatal hernia   . H/O vertigo    with fluid  in ears  . Heart murmur   . Hemorrhoids   . Hemorrhoids   . History of rheumatic fever   . Hx of hysterectomy   . Hx: UTI (urinary tract infection)   . Hyperlipidemia   . LBBB (left bundle branch block)   . Myocardial infarction (HCC) 2002  . Pneumonia   . PONV (postoperative nausea and vomiting)   . 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 Snoring   . Squamous cell carcinoma of skin    nose  . Urinary frequency    Past Surgical History:  Procedure Laterality Date  . ABDOMINAL HYSTERECTOMY    . APPENDECTOMY  1973  . BACK SURGERY     Lumbar Lam and discectomy x 2  . BREAST BIOPSY Right 10/2011  . BREAST LUMPECTOMY Right   . CARDIAC CATHETERIZATION  2012  . CARDIAC VALVE REPLACEMENT  2012   Aortic  . CORONARY ARTERY BYPASS GRAFT  2012  . DILATION AND CURETTAGE OF UTERUS    . ENDOMETRIAL ABLATION    . LAPAROSCOPIC LYSIS INTESTINAL ADHESIONS    . LUMBAR DISC SURGERY     DR. Deneen Harts- [place]  . PACEMAKER IMPLANT N/A 01/17/2020   Procedure: PACEMAKER IMPLANT;  Surgeon: Regan Lemming, MD;  Location: [facility];  Service: Cardiovascular;  Laterality: N/A;  . SKIN SURGERY     skin sarcomas removed  . TONSILLECTOMY     1955  . TONSILLECTOMY     Family History  Problem Relation Age of Onset  . Hypertension Mother   . Breast cancer Mother   . Hypothyroidism Mother   . Diabetes type II Mother   . Heart disease Mother   . Heart attack  Father        CABG @ 67  . CAD Father   . Breast cancer Sister        x 2  . Colon cancer Maternal Aunt   . Breast cancer Maternal Aunt   . Colon cancer Paternal Uncle   . Pancreatic cancer Other        PGA  . Breast cancer Paternal Grandmother   . Stomach cancer Neg Hx   . Throat cancer Neg Hx   . Esophageal cancer Neg Hx    Social History   Socioeconomic History  . Marital  status: Married    Spouse name: Not on file  . Number of children: 2  . Years of education: Not on file  . Highest education level: Not on file  Occupational History  . Occupation: retired    Fish farm manager: RETIRED    Comment: [employer], TEFL teacher, CERTIFIED AS SPECIAL ED TEACHER  Tobacco Use  . Smoking status: Former Smoker    Packs/day: 1.00    Years: 40.00    Pack years: 40.00    Types: Cigarettes    Quit date: 02/03/2000    Years since quitting: 20.3  . Smokeless tobacco: Former Systems developer    Types: Chew  . Tobacco comment: discussed LDCT   Vaping Use  . Vaping Use: Never used  Substance and Sexual Activity  . Alcohol use: No    Alcohol/week: 0.0 standard drinks  . Drug use: No  . Sexual activity: Yes  Other Topics Concern  . Not on file  Social History Narrative   MARRIED   1 SON 1 DAUGHTER 2 GRANDCHILDREN   Music therapist, NOW FULL TIME CAREGIVER   LIVES IN [place], GREW UP IN [place].   Social Determinants of Health   Financial Resource Strain: Low Risk   . Difficulty of Paying Living Expenses: Not hard at all  Food Insecurity: No Food Insecurity  . Worried About Charity fundraiser in the Last Year: Never true  . Ran Out of Food in the Last Year: Never true  Transportation Needs: No Transportation Needs  . Lack of Transportation (Medical): No  . Lack of Transportation (Non-Medical): No  Physical Activity: Sufficiently Active  . Days of Exercise per Week: 5 days  . Minutes of Exercise per Session: 30 min  Stress: No Stress Concern Present  . Feeling of Stress : Not at all  Social  Connections: Socially Integrated  . Frequency of Communication with Friends and Family: More than three times a week  . Frequency of Social Gatherings with Friends and Family: More than three times a week  . Attends Religious Services: More than 4 times per year  . Active Member of Clubs or Organizations: Yes  . Attends Archivist Meetings: More than 4 times per year  . 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 no  Interpreter Needed?: No  Information entered by :: Lisette Abu, LPN   Activities of Daily Living In your present state of health, do you have any difficulty performing the following activities: 06/19/2020 01/17/2020  Hearing? N N  Vision? N N  Difficulty concentrating or making decisions? N N  Walking or climbing stairs? N N  Dressing or bathing? N N  Doing errands, shopping? N N  Preparing Food and eating ? N -  Using the Toilet? N -  In the past six months, have you accidently leaked urine? N -  Do you have problems with loss of bowel control? N -  Managing your Medications? N -  Managing your Finances? N -  Housekeeping or  managing your Housekeeping? N -  Some recent data might be hidden    Patient Care Team: Janith Lima, MD as PCP - General (Internal Medicine) Sherren Mocha, MD as PCP - Cardiology (Cardiology) Constance Haw, MD as PCP - Electrophysiology (Cardiology) Melrose Nakayama, MD (Cardiothoracic  Surgery) Latanya Maudlin, MD as Consulting Physician (Orthopedic Surgery) Warren Danes, PA-C as Physician Assistant (Dermatology)  Indicate any recent Medical Services you may have received from other than [facility] providers in the past year (date may be approximate).     Assessment:   This is a routine wellness examination for Heather Crawford.  Hearing/Vision screen No exam data present  Dietary issues and exercise activities discussed: Current Exercise Habits: Home exercise routine, Time (Minutes): 30, Frequency (Times/Week): 5, Weekly Exercise (Minutes/Week): 150, Intensity: Mild, Exercise limited by: cardiac condition(s)  Goals Addressed   None    Depression Screen PHQ 2/9 Scores 06/19/2020 05/04/2019 04/04/2018 02/08/2017 11/16/2015 10/22/2015 10/17/2014  PHQ - 2 Score 0 0 0 0 0 0 0  PHQ- 9 Score - - - 1 - - -    Fall Risk Fall Risk  06/19/2020 05/04/2019 04/04/2018 02/08/2017 11/16/2015  Falls in the past year? 1 0 0 Yes No  Number falls in past yr: 0 0 0 1 -  Injury with Fall? 1 0 - - -  Risk for fall due to : - No Fall Risks - - -  Follow up Falls evaluation completed Falls evaluation completed;Education provided;Falls prevention discussed - - -    FALL RISK PREVENTION PERTAINING TO THE HOME:  Any stairs in or around the home? No  If so, are there any without handrails? No  Home free of loose throw rugs in walkways, pet beds, electrical cords, etc? Yes  Adequate lighting in your home to reduce risk of falls? Yes   ASSISTIVE DEVICES UTILIZED TO PREVENT FALLS:  Life alert? No  Use of a cane, walker or w/c? No  Grab bars in the bathroom? Yes  Shower chair or bench in shower? Yes  Elevated toilet seat or a handicapped toilet? Yes   TIMED UP AND GO:  Was the test performed? No .  Length of time to ambulate 10 feet: 0 sec.   Gait steady and fast without use of assistive device  Cognitive Function: Normal cognitive status assessed by direct observation by this Nurse Health  Advisor. No abnormalities found.   MMSE - Mini Mental State Exam 02/08/2017 10/22/2015 10/11/2014  Not completed: - (No Data) Unable to complete  Orientation to time 5 - -  Orientation to Place 5 - -  Registration 3 - -  Attention/ Calculation 5 - -  Recall 2 - -  Language- name 2 objects 2 - -  Language- repeat 1 - -  Language- follow 3 step command 3 - -  Language- read & follow direction 1 - -  Write a sentence 1 - -  Copy design 1 - -  Total score 29 - -     6CIT Screen 05/04/2019  What Year? 0 points  What month? 0 points  What time? 0 points  Count back from 20 0 points  Months in reverse 0 points  Repeat phrase 0 points  Total Score 0    Immunizations Immunization History  Administered Date(s) Administered  . Hep A / Hep B 10/17/2014, 04/16/2015  . Hepatitis B, adult 11/16/2014  . PFIZER(Purple Top)SARS-COV-2 Vaccination 02/14/2019, 03/07/2019  . Pneumococcal Conjugate-13 08/16/2013  . Pneumococcal Polysaccharide-23 08/12/2011,  09/12/2019  . Td 02/21/2010  . Tdap 02/21/2010    TDAP status: Due, Education has been provided regarding the importance of this vaccine. Advised may receive this vaccine at local pharmacy or Health Dept. Aware to provide a copy of the vaccination record if obtained from local pharmacy or Health Dept. Verbalized acceptance and understanding.  Flu Vaccine status: Declined, Education has been provided regarding the importance of this vaccine but patient still declined. Advised may receive this vaccine at local pharmacy or Health Dept. Aware to provide a copy of the vaccination record if obtained from local pharmacy or Health Dept. Verbalized acceptance and understanding.  Pneumococcal vaccine status: Up to date  Covid-19 vaccine status: Completed vaccines  Qualifies for Shingles Vaccine? Yes   Zostavax completed No   Shingrix Completed?: No.    Education has been provided regarding the importance of this vaccine. Patient has been advised to call  insurance company to determine out of pocket expense if they have not yet received this vaccine. Advised may also receive vaccine at local pharmacy or Health Dept. Verbalized acceptance and understanding.  Screening Tests Health Maintenance  Topic Date Due  . OPHTHALMOLOGY EXAM  11/18/2018  . COVID-19 Vaccine (3 - Pfizer risk 4-dose series) 04/04/2019  . TETANUS/TDAP  02/22/2020  . HEMOGLOBIN A1C  03/14/2020  . COLONOSCOPY (Pts 45-38yrs Insurance coverage will need to be confirmed)  04/29/2020  . FOOT EXAM  06/19/2020  . INFLUENZA VACCINE  09/02/2020  . DEXA SCAN  Completed  . Hepatitis C Screening  Completed  . PNA vac Low Risk Adult  Completed  . HPV VACCINES  Aged Out    Health Maintenance  Health Maintenance Due  Topic Date Due  . OPHTHALMOLOGY EXAM  11/18/2018  . COVID-19 Vaccine (3 - Pfizer risk 4-dose series) 04/04/2019  . TETANUS/TDAP  02/22/2020  . HEMOGLOBIN A1C  03/14/2020  . COLONOSCOPY (Pts 45-61yrs Insurance coverage will need to be confirmed)  04/29/2020  . FOOT EXAM  06/19/2020    Colorectal cancer screening: Type of screening: Colonoscopy. Completed 04/30/2015. Repeat every 5 years  Mammogram status: Completed 01/11/2020. Repeat every year  Bone Density status: Completed 02/27/2020. Results reflect: Bone density results: OSTEOPENIA. Repeat every 2-3 years.  Lung Cancer Screening: (Low Dose CT Chest recommended if Age 56-80 years, 30 pack-year currently smoking OR have quit w/in 15years.) does not qualify.   Lung Cancer Screening Referral: no  Additional Screening:  Hepatitis C Screening: does qualify; Completed yes  Vision Screening: Recommended annual ophthalmology exams for early detection of glaucoma and other disorders of the eye. Is the patient up to date with their annual eye exam?  Yes  Who is the provider or what is the name of the office in which the patient attends annual eye exams? [facility] If pt is not established with a  provider, would they like to be referred to a provider to establish care? No .   Dental Screening: Recommended annual dental exams for proper oral hygiene  Community Resource Referral / Chronic Care Management: CRR required this visit?  No   CCM required this visit?  No      Plan:     I have personally reviewed and noted the following in the patient's chart:   . Medical and social history . Use of alcohol, tobacco or illicit drugs  . Current medications and supplements including opioid prescriptions.  . Functional ability and status . Nutritional status . Physical activity . Advanced directives . List  of other physicians . Hospitalizations, surgeries, and ER visits in previous 12 months . Vitals . Screenings to include cognitive, depression, and falls . Referrals and appointments  In addition, I have reviewed and discussed with patient certain preventive protocols, quality metrics, and best practice recommendations. A written personalized care plan for preventive services as well as general preventive health recommendations were provided to patient.     Sheral Flow, LPN   9/39/0300   Nurse Notes: n/a

## 2020-06-19 NOTE — Patient Instructions (Addendum)
[name] , Thank you for taking time to come for your Medicare Wellness Visit. I appreciate your ongoing commitment to your health goals. Please review the following plan we discussed and let me know if I can assist you in the future.   Screening recommendations/referrals: Colonoscopy: 04/30/2015; due every 5 years  Mammogram: 01/11/2020 Bone Density: 02/27/2020 Recommended yearly ophthalmology/optometry visit for glaucoma screening and checkup Recommended yearly dental visit for hygiene and checkup  Vaccinations: Influenza vaccine: declined Pneumococcal vaccine: 08/16/2013, 09/12/2019 Tdap vaccine: 02/21/2010; due every 10 years Shingles vaccine:Please call your insurance company to determine your out of pocket expense for the Shingrix vaccine. You may receive this vaccine at your local pharmacy.  Covid-19: 02/14/2019, 03/07/2019, 11/15/2020  Advanced directives: Please bring a copy of your health care power of attorney and living will to the office at your convenience.  Conditions/risks identified: Yes; Reviewed health maintenance screenings with patient today and relevant education, vaccines, and/or referrals were provided. Please continue to do your personal lifestyle choices by: daily care of teeth and gums, regular physical activity (goal should be 5 days a week for 30 minutes), eat a healthy diet, avoid tobacco and drug use, limiting any alcohol intake, taking a low-dose aspirin (if not allergic or have been advised by your provider otherwise) and taking vitamins and minerals as recommended by your provider. Continue doing brain stimulating activities (puzzles, reading, adult coloring books, staying active) to keep memory sharp. Continue to eat heart healthy diet (full of fruits, vegetables, whole grains, lean protein, water--limit salt, fat, and sugar intake) and increase physical activity as tolerated.  Next appointment: Please schedule your next Medicare Wellness Visit with your Nurse Health  Advisor in 1 year by calling [phone].   Preventive Care 77 Years and Older, Female Preventive care refers to lifestyle choices and visits with your health care provider that can promote health and wellness. What does preventive care include?  A yearly physical exam. This is also called an annual well check.  Dental exams once or twice a year.  Routine eye exams. Ask your health care provider how often you should have your eyes checked.  Personal lifestyle choices, including:  Daily care of your teeth and gums.  Regular physical activity.  Eating a healthy diet.  Avoiding tobacco and drug use.  Limiting alcohol use.  Practicing safe sex.  Taking low-dose aspirin every day.  Taking vitamin and mineral supplements as recommended by your health care provider. What happens during an annual well check? The services and screenings done by your health care provider during your annual well check will depend on your age, overall health, lifestyle risk factors, and family history of disease. Counseling  Your health care provider may ask you questions about your:  Alcohol use.  Tobacco use.  Drug use.  Emotional well-being.  Home and relationship well-being.  Sexual activity.  Eating habits.  History of falls.  Memory and ability to understand (cognition).  Work and work Statistician.  Reproductive health. Screening  You may have the following tests or measurements:  Height, weight, and BMI.  Blood pressure.  Lipid and cholesterol levels. These may be checked every 5 years, or more frequently if you are over 53 years old.  Skin check.  Lung cancer screening. You may have this screening every year starting at age 64 if you have a 30-pack-year history of smoking and currently smoke or have quit within the past 15 years.  Fecal occult blood test (FOBT) of the stool. You may  have this test every year starting at age 48.  Flexible sigmoidoscopy or  colonoscopy. You may have a sigmoidoscopy every 5 years or a colonoscopy every 10 years starting at age 8.  Hepatitis C blood test.  Hepatitis B blood test.  Sexually transmitted disease (STD) testing.  Diabetes screening. This is done by checking your blood sugar (glucose) after you have not eaten for a while (fasting). You may have this done every 1-3 years.  Bone density scan. This is done to screen for osteoporosis. You may have this done starting at age 84.  Mammogram. This may be done every 1-2 years. Talk to your health care provider about how often you should have regular mammograms. Talk with your health care provider about your test results, treatment options, and if necessary, the need for more tests. Vaccines  Your health care provider may recommend certain vaccines, such as:  Influenza vaccine. This is recommended every year.  Tetanus, diphtheria, and acellular pertussis (Tdap, Td) vaccine. You may need a Td booster every 10 years.  Zoster vaccine. You may need this after age 79.  Pneumococcal 13-valent conjugate (PCV13) vaccine. One dose is recommended after age 73.  Pneumococcal polysaccharide (PPSV23) vaccine. One dose is recommended after age 15. Talk to your health care provider about which screenings and vaccines you need and how often you need them. This information is not intended to replace advice given to you by your health care provider. Make sure you discuss any questions you have with your health care provider. Document Released: 02/15/2015 Document Revised: 10/09/2015 Document Reviewed: 11/20/2014 Elsevier Interactive Patient Education  2017 [place] Prevention in the Home Falls can cause injuries. They can happen to people of all ages. There are many things you can do to make your home safe and to help prevent falls. What can I do on the outside of my home?  Regularly fix the edges of walkways and driveways and fix any cracks.  Remove  anything that might make you trip as you walk through a door, such as a raised step or threshold.  Trim any bushes or trees on the path to your home.  Use bright outdoor lighting.  Clear any walking paths of anything that might make someone trip, such as rocks or tools.  Regularly check to see if handrails are loose or broken. Make sure that both sides of any steps have handrails.  Any raised decks and porches should have guardrails on the edges.  Have any leaves, snow, or ice cleared regularly.  Use sand or salt on walking paths during winter.  Clean up any spills in your garage right away. This includes oil or grease spills. What can I do in the bathroom?  Use night lights.  Install grab bars by the toilet and in the tub and shower. Do not use towel bars as grab bars.  Use non-skid mats or decals in the tub or shower.  If you need to sit down in the shower, use a plastic, non-slip stool.  Keep the floor dry. Clean up any water that spills on the floor as soon as it happens.  Remove soap buildup in the tub or shower regularly.  Attach bath mats securely with double-sided non-slip rug tape.  Do not have throw rugs and other things on the floor that can make you trip. What can I do in the bedroom?  Use night lights.  Make sure that you have a light by your bed that is easy  to reach.  Do not use any sheets or blankets that are too big for your bed. They should not hang down onto the floor.  Have a firm chair that has side arms. You can use this for support while you get dressed.  Do not have throw rugs and other things on the floor that can make you trip. What can I do in the kitchen?  Clean up any spills right away.  Avoid walking on wet floors.  Keep items that you use a lot in easy-to-reach places.  If you need to reach something above you, use a strong step stool that has a grab bar.  Keep electrical cords out of the way.  Do not use floor polish or wax that  makes floors slippery. If you must use wax, use non-skid floor wax.  Do not have throw rugs and other things on the floor that can make you trip. What can I do with my stairs?  Do not leave any items on the stairs.  Make sure that there are handrails on both sides of the stairs and use them. Fix handrails that are broken or loose. Make sure that handrails are as long as the stairways.  Check any carpeting to make sure that it is firmly attached to the stairs. Fix any carpet that is loose or worn.  Avoid having throw rugs at the top or bottom of the stairs. If you do have throw rugs, attach them to the floor with carpet tape.  Make sure that you have a light switch at the top of the stairs and the bottom of the stairs. If you do not have them, ask someone to add them for you. What else can I do to help prevent falls?  Wear shoes that:  Do not have high heels.  Have rubber bottoms.  Are comfortable and fit you well.  Are closed at the toe. Do not wear sandals.  If you use a stepladder:  Make sure that it is fully opened. Do not climb a closed stepladder.  Make sure that both sides of the stepladder are locked into place.  Ask someone to hold it for you, if possible.  Clearly mark and make sure that you can see:  Any grab bars or handrails.  First and last steps.  Where the edge of each step is.  Use tools that help you move around (mobility aids) if they are needed. These include:  Canes.  Walkers.  Scooters.  Crutches.  Turn on the lights when you go into a dark area. Replace any light bulbs as soon as they burn out.  Set up your furniture so you have a clear path. Avoid moving your furniture around.  If any of your floors are uneven, fix them.  If there are any pets around you, be aware of where they are.  Review your medicines with your doctor. Some medicines can make you feel dizzy. This can increase your chance of falling. Ask your doctor what other  things that you can do to help prevent falls. This information is not intended to replace advice given to you by your health care provider. Make sure you discuss any questions you have with your health care provider. Document Released: 11/15/2008 Document Revised: 06/27/2015 Document Reviewed: 02/23/2014 Elsevier Interactive Patient Education  2017 [employer].

## 2020-06-24 ENCOUNTER — Encounter: Payer: Self-pay | Admitting: Internal Medicine

## 2020-06-24 ENCOUNTER — Ambulatory Visit (INDEPENDENT_AMBULATORY_CARE_PROVIDER_SITE_OTHER): Payer: Medicare HMO | Admitting: Internal Medicine

## 2020-06-24 ENCOUNTER — Other Ambulatory Visit: Payer: Self-pay

## 2020-06-24 VITALS — BP 142/74 | HR 64 | Temp 97.9°F | Resp 16 | Ht 63.0 in | Wt 163.0 lb

## 2020-06-24 DIAGNOSIS — Z23 Encounter for immunization: Secondary | ICD-10-CM | POA: Diagnosis not present

## 2020-06-24 DIAGNOSIS — I5032 Chronic diastolic (congestive) heart failure: Secondary | ICD-10-CM | POA: Diagnosis not present

## 2020-06-24 DIAGNOSIS — I10 Essential (primary) hypertension: Secondary | ICD-10-CM | POA: Diagnosis not present

## 2020-06-24 DIAGNOSIS — E118 Type 2 diabetes mellitus with unspecified complications: Secondary | ICD-10-CM

## 2020-06-24 DIAGNOSIS — Z0000 Encounter for general adult medical examination without abnormal findings: Secondary | ICD-10-CM

## 2020-06-24 DIAGNOSIS — E781 Pure hyperglyceridemia: Secondary | ICD-10-CM | POA: Diagnosis not present

## 2020-06-24 DIAGNOSIS — E785 Hyperlipidemia, unspecified: Secondary | ICD-10-CM | POA: Diagnosis not present

## 2020-06-24 DIAGNOSIS — M839 Adult osteomalacia, unspecified: Secondary | ICD-10-CM

## 2020-06-24 DIAGNOSIS — I700 Atherosclerosis of aorta: Secondary | ICD-10-CM | POA: Diagnosis not present

## 2020-06-24 LAB — URINALYSIS, ROUTINE W REFLEX MICROSCOPIC
Bilirubin Urine: NEGATIVE
Hgb urine dipstick: NEGATIVE
Ketones, ur: NEGATIVE
Leukocytes,Ua: NEGATIVE
Nitrite: NEGATIVE
RBC / HPF: NONE SEEN (ref 0–?)
Specific Gravity, Urine: 1.01 (ref 1.000–1.030)
Total Protein, Urine: NEGATIVE
Urine Glucose: NEGATIVE
Urobilinogen, UA: 0.2 (ref 0.0–1.0)
WBC, UA: NONE SEEN (ref 0–?)
pH: 5.5 (ref 5.0–8.0)

## 2020-06-24 LAB — CBC WITH DIFFERENTIAL/PLATELET
Basophils Absolute: 0.1 10*3/uL (ref 0.0–0.1)
Basophils Relative: 0.9 % (ref 0.0–3.0)
Eosinophils Absolute: 0.1 10*3/uL (ref 0.0–0.7)
Eosinophils Relative: 0.9 % (ref 0.0–5.0)
HCT: 36.9 % (ref 36.0–46.0)
Hemoglobin: 12.1 g/dL (ref 12.0–15.0)
Lymphocytes Relative: 30 % (ref 12.0–46.0)
Lymphs Abs: 2.2 10*3/uL (ref 0.7–4.0)
MCHC: 32.9 g/dL (ref 30.0–36.0)
MCV: 88.7 fl (ref 78.0–100.0)
Monocytes Absolute: 0.5 10*3/uL (ref 0.1–1.0)
Monocytes Relative: 6.6 % (ref 3.0–12.0)
Neutro Abs: 4.4 10*3/uL (ref 1.4–7.7)
Neutrophils Relative %: 61.6 % (ref 43.0–77.0)
Platelets: 227 10*3/uL (ref 150.0–400.0)
RBC: 4.16 Mil/uL (ref 3.87–5.11)
RDW: 13.5 % (ref 11.5–15.5)
WBC: 7.2 10*3/uL (ref 4.0–10.5)

## 2020-06-24 LAB — MICROALBUMIN / CREATININE URINE RATIO
Creatinine,U: 9.8 mg/dL
Microalb Creat Ratio: 7.1 mg/g (ref 0.0–30.0)
Microalb, Ur: <0.7 mg/dL (ref 0.0–1.9)

## 2020-06-24 LAB — TSH: TSH: 3.78 u[IU]/mL (ref 0.35–4.50)

## 2020-06-24 LAB — HEMOGLOBIN A1C: Hgb A1c MFr Bld: 6.9 % — ABNORMAL HIGH (ref 4.6–6.5)

## 2020-06-24 NOTE — Progress Notes (Signed)
Subjective:  Patient ID: Heather Crawford, female    DOB: Sep 07, 1945  Age: 75 y.o. MRN: [id number]  CC: Annual Exam, Hypertension, Diabetes, and Hyperlipidemia  This visit occurred during the SARS-CoV-2 public health emergency.  Safety protocols were in place, including screening questions prior to the visit, additional usage of staff PPE, and extensive cleaning of exam room while observing appropriate contact time as indicated for disinfecting solutions.    HPI Heather Crawford presents for a CPX and f/up.  She is status post PPM and tells me she feels much better.  She has rare dizzy spells and vertigo.  These dizzy spells are exacerbated by changes in position.  She is active and denies any recent episodes of chest pain, shortness of breath, edema, diaphoresis, or palpitations.  Outpatient Medications Prior to Visit  Medication Sig Dispense Refill  . acetaminophen (TYLENOL) 325 MG tablet Take 1-2 tablets (325-650 mg total) by mouth every 4 (four) hours as needed for mild pain.    Marland Kitchen aspirin 325 MG EC tablet Take 1 tablet (325 mg total) by mouth daily. Ok to resume Friday per Dr. Curt Bears    . Cholecalciferol 2000 units TABS Take 1 tablet (2,000 Units total) by mouth daily. 90 tablet 1  . fluticasone (FLONASE) 50 MCG/ACT nasal spray USE 1 SPRAY IN EACH NOSTRIL EVERY DAY 32 g 11  . furosemide (LASIX) 20 MG tablet TAKE 1 TABLET DAILY AS NEEDED FOR EDEMA OR WEIGHT GAIN. TAKE POTASSIUM 10MEQ WHEN YOU TAKE LASIX 90 tablet 3  . isosorbide mononitrate (IMDUR) 30 MG 24 hr tablet Take 1 tablet (30 mg total) by mouth daily. 90 tablet 3  . meclizine (ANTIVERT) 12.5 MG tablet Take 1 tablet (12.5 mg total) by mouth 3 (three) times daily as needed for dizziness. 90 tablet 3  . Omega-3 Fatty Acids (FISH OIL) 1000 MG CAPS Take 1,000 mg by mouth daily.    Marland Kitchen omeprazole (PRILOSEC) 40 MG capsule Take 1 capsule (40 mg total) by mouth daily. 90 capsule 1  . simvastatin (ZOCOR) 40 MG tablet TAKE 1 TABLET AT  BEDTIME 90 tablet 2  . irbesartan (AVAPRO) 150 MG tablet Take 75 mg by mouth in the morning and at bedtime.    . potassium chloride (K-DUR,KLOR-CON) 10 MEQ tablet Take 1 tablet by mouth on the days you take lasix (furosemide) 90 tablet 3   No facility-administered medications prior to visit.    ROS Review of Systems  Constitutional: Negative for chills, diaphoresis, fatigue and fever.  HENT: Negative.   Eyes: Negative.   Respiratory: Negative for cough, chest tightness, shortness of breath and wheezing.   Cardiovascular: Negative for chest pain, palpitations and leg swelling.  Gastrointestinal: Negative for abdominal pain, constipation, diarrhea, nausea and vomiting.  Endocrine: Negative.   Genitourinary: Negative.  Negative for difficulty urinating.  Musculoskeletal: Negative for arthralgias and myalgias.  Skin: Negative.  Negative for color change and pallor.  Neurological: Positive for dizziness. Negative for syncope, weakness, light-headedness, numbness and headaches.  Hematological: Negative for adenopathy. Does not bruise/bleed easily.  Psychiatric/Behavioral: Negative.     Objective:  BP (!) 142/74 (BP Location: Left Arm, Patient Position: Sitting, Cuff Size: Large)   Pulse 64   Temp 97.9 F (36.6 C) (Oral)   Resp 16   Ht 5\' 3"  (1.6 m)   Wt 163 lb (73.9 kg)   SpO2 94%   BMI 28.87 kg/m   BP Readings from Last 3 Encounters:  06/24/20 (!) 142/74  06/19/20 120/60  04/22/20 128/68    Wt Readings from Last 3 Encounters:  06/24/20 163 lb (73.9 kg)  06/19/20 163 lb 9.6 oz (74.2 kg)  04/22/20 176 lb (79.8 kg)    Physical Exam Vitals reviewed.  Constitutional:      Appearance: Normal appearance.  HENT:     Nose: Nose normal.     Mouth/Throat:     Mouth: Mucous membranes are moist.  Eyes:     General: No scleral icterus.    Conjunctiva/sclera: Conjunctivae normal.  Cardiovascular:     Rate and Rhythm: Normal rate and regular rhythm.     Heart sounds: Murmur  heard.   Systolic murmur is present with a grade of 2/6.  No diastolic murmur is present. No friction rub. No gallop.   Pulmonary:     Effort: Pulmonary effort is normal.     Breath sounds: No stridor. No wheezing, rhonchi or rales.  Abdominal:     General: Abdomen is flat.     Palpations: There is no mass.     Tenderness: There is no abdominal tenderness. There is no guarding.  Musculoskeletal:        General: Normal range of motion.     Cervical back: Neck supple.     Right lower leg: No edema.     Left lower leg: No edema.  Lymphadenopathy:     Cervical: No cervical adenopathy.  Skin:    General: Skin is warm and dry.     Coloration: Skin is not pale.  Neurological:     General: No focal deficit present.     Mental Status: She is alert.  Psychiatric:        Mood and Affect: Mood normal.        Behavior: Behavior normal.     Lab Results  Component Value Date   WBC 7.2 06/24/2020   HGB 12.1 06/24/2020   HCT 36.9 06/24/2020   PLT 227.0 06/24/2020   GLUCOSE 94 06/24/2020   CHOL 140 06/24/2020   TRIG 201.0 (H) 06/24/2020   HDL 42.00 06/24/2020   LDLDIRECT 76.0 06/24/2020   LDLCALC 61 01/06/2018   ALT 12 06/24/2020   AST 16 06/24/2020   NA 140 06/24/2020   K 4.7 06/24/2020   CL 102 06/24/2020   CREATININE 0.85 06/24/2020   BUN 23 06/24/2020   CO2 28 06/24/2020   TSH 3.78 06/24/2020   INR 3.1 06/18/2010   HGBA1C 6.9 (H) 06/24/2020   MICROALBUR <0.7 06/24/2020    No results found.  Assessment & Plan:   Heather Crawford was seen today for annual exam, hypertension, diabetes and hyperlipidemia.  Diagnoses and all orders for this visit:  Type II diabetes mellitus with manifestations ([place])- I recommended that she start taking an SGLT2 inhibitor. -     Basic metabolic panel; Future -     Microalbumin / creatinine urine ratio; Future -     Hemoglobin A1c; Future -     Hemoglobin A1c -     Microalbumin / creatinine urine ratio -     Basic metabolic panel -     HM  Diabetes Foot Exam -     irbesartan (AVAPRO) 150 MG tablet; Take 0.5 tablets (75 mg total) by mouth in the morning and at bedtime. -     empagliflozin (JARDIANCE) 10 MG TABS tablet; Take 1 tablet (10 mg total) by mouth daily.  Vitamin D deficient osteomalacia  Hyperlipidemia LDL goal <70- She has achieved her LDL goal is doing well  on the statin. -     Lipid panel; Future -     Hepatic function panel; Future -     TSH; Future -     TSH -     Hepatic function panel -     Lipid panel  Hypertriglyceridemia- Improvement noted. -     Lipid panel; Future -     Lipid panel  Routine health maintenance- Exam completed, labs reviewed, vaccines reviewed and updated, no cancer screenings are indicated, patient education was given.  Essential hypertension- Her blood pressure is adequately well controlled. -     CBC with Differential/Platelet; Future -     Basic metabolic panel; Future -     Urinalysis, Routine w reflex microscopic; Future -     TSH; Future -     TSH -     Urinalysis, Routine w reflex microscopic -     Basic metabolic panel -     CBC with Differential/Platelet -     irbesartan (AVAPRO) 150 MG tablet; Take 0.5 tablets (75 mg total) by mouth in the morning and at bedtime.  Atherosclerosis of aorta ([place])- Risk factor modifications addressed.  Chronic diastolic heart failure ([place])- Will start empagliflozin for risk reduction. -     empagliflozin (JARDIANCE) 10 MG TABS tablet; Take 1 tablet (10 mg total) by mouth daily.  Other orders -     Tdap vaccine greater than or equal to 7yo IM -     LDL cholesterol, direct   I have discontinued Murvin Natal. Sesma's potassium chloride. I have also changed her irbesartan. Additionally, I am having her start on empagliflozin. Lastly, I am having her maintain her Cholecalciferol, meclizine, fluticasone, simvastatin, Fish Oil, acetaminophen, aspirin, isosorbide mononitrate, furosemide, and omeprazole.  Meds ordered this encounter   Medications  . irbesartan (AVAPRO) 150 MG tablet    Sig: Take 0.5 tablets (75 mg total) by mouth in the morning and at bedtime.    Dispense:  90 tablet    Refill:  1  . empagliflozin (JARDIANCE) 10 MG TABS tablet    Sig: Take 1 tablet (10 mg total) by mouth daily.    Dispense:  90 tablet    Refill:  0     Follow-up: Return in about 6 months (around 12/25/2020).  Scarlette Calico, MD

## 2020-06-24 NOTE — Patient Instructions (Signed)
Health Maintenance, Female Adopting a healthy lifestyle and getting preventive care are important in promoting health and wellness. Ask your health care provider about:  The right schedule for you to have regular tests and exams.  Things you can do on your own to prevent diseases and keep yourself healthy. What should I know about diet, weight, and exercise? Eat a healthy diet  Eat a diet that includes plenty of vegetables, fruits, low-fat dairy products, and lean protein.  Do not eat a lot of foods that are high in solid fats, added sugars, or sodium.   Maintain a healthy weight Body mass index (BMI) is used to identify weight problems. It estimates body fat based on height and weight. Your health care provider can help determine your BMI and help you achieve or maintain a healthy weight. Get regular exercise Get regular exercise. This is one of the most important things you can do for your health. Most adults should:  Exercise for at least 150 minutes each week. The exercise should increase your heart rate and make you sweat (moderate-intensity exercise).  Do strengthening exercises at least twice a week. This is in addition to the moderate-intensity exercise.  Spend less time sitting. Even light physical activity can be beneficial. Watch cholesterol and blood lipids Have your blood tested for lipids and cholesterol at 77 years of age, then have this test every 5 years. Have your cholesterol levels checked more often if:  Your lipid or cholesterol levels are high.  You are older than 77 years of age.  You are at high risk for heart disease. What should I know about cancer screening? Depending on your health history and family history, you may need to have cancer screening at various ages. This may include screening for:  Breast cancer.  Cervical cancer.  Colorectal cancer.  Skin cancer.  Lung cancer. What should I know about heart disease, diabetes, and high blood  pressure? Blood pressure and heart disease  High blood pressure causes heart disease and increases the risk of stroke. This is more likely to develop in people who have high blood pressure readings, are of African descent, or are overweight.  Have your blood pressure checked: ? Every 3-5 years if you are 18-39 years of age. ? Every year if you are 40 years old or older. Diabetes Have regular diabetes screenings. This checks your fasting blood sugar level. Have the screening done:  Once every three years after age 40 if you are at a normal weight and have a low risk for diabetes.  More often and at a younger age if you are overweight or have a high risk for diabetes. What should I know about preventing infection? Hepatitis B If you have a higher risk for hepatitis B, you should be screened for this virus. Talk with your health care provider to find out if you are at risk for hepatitis B infection. Hepatitis C Testing is recommended for:  Everyone born from 1945 through 1965.  Anyone with known risk factors for hepatitis C. Sexually transmitted infections (STIs)  Get screened for STIs, including gonorrhea and chlamydia, if: ? You are sexually active and are younger than 77 years of age. ? You are older than 77 years of age and your health care provider tells you that you are at risk for this type of infection. ? Your sexual activity has changed since you were last screened, and you are at increased risk for chlamydia or gonorrhea. Ask your health care provider   if you are at risk.  Ask your health care provider about whether you are at high risk for HIV. Your health care provider may recommend a prescription medicine to help prevent HIV infection. If you choose to take medicine to prevent HIV, you should first get tested for HIV. You should then be tested every 3 months for as long as you are taking the medicine. Pregnancy  If you are about to stop having your period (premenopausal) and  you may become pregnant, seek counseling before you get pregnant.  Take 400 to 800 micrograms (mcg) of folic acid every day if you become pregnant.  Ask for birth control (contraception) if you want to prevent pregnancy. Osteoporosis and menopause Osteoporosis is a disease in which the bones lose minerals and strength with aging. This can result in bone fractures. If you are 65 years old or older, or if you are at risk for osteoporosis and fractures, ask your health care provider if you should:  Be screened for bone loss.  Take a calcium or vitamin D supplement to lower your risk of fractures.  Be given hormone replacement therapy (HRT) to treat symptoms of menopause. Follow these instructions at home: Lifestyle  Do not use any products that contain nicotine or tobacco, such as cigarettes, e-cigarettes, and chewing tobacco. If you need help quitting, ask your health care provider.  Do not use street drugs.  Do not share needles.  Ask your health care provider for help if you need support or information about quitting drugs. Alcohol use  Do not drink alcohol if: ? Your health care provider tells you not to drink. ? You are pregnant, may be pregnant, or are planning to become pregnant.  If you drink alcohol: ? Limit how much you use to 0-1 drink a day. ? Limit intake if you are breastfeeding.  Be aware of how much alcohol is in your drink. In the [place] one drink equals one 12 oz bottle of beer (355 mL), one 5 oz glass of wine (148 mL), or one 1 oz glass of hard liquor (44 mL). General instructions  Schedule regular health, dental, and eye exams.  Stay current with your vaccines.  Tell your health care provider if: ? You often feel depressed. ? You have ever been abused or do not feel safe at home. Summary  Adopting a healthy lifestyle and getting preventive care are important in promoting health and wellness.  Follow your health care provider's instructions about healthy  diet, exercising, and getting tested or screened for diseases.  Follow your health care provider's instructions on monitoring your cholesterol and blood pressure. This information is not intended to replace advice given to you by your health care provider. Make sure you discuss any questions you have with your health care provider. Document Revised: 01/12/2018 Document Reviewed: 01/12/2018 Elsevier Patient Education  2021 Elsevier Inc.  

## 2020-06-24 NOTE — Progress Notes (Signed)
Pt has been informed that the recommended vaccine TDAP, may not be covered under their current Medicare insurance. Discussed that they will possibly incur a bill for the TDAP vaccine & administration fee.  Pt understands that if they want the TDAP vaccine, Medicare will be billed for an official decision on payment, which will be sent to them in a Medicare Summary Notice (MSN). They understand that if Medicare does not pay, they are responsible for payment.     

## 2020-06-25 LAB — LDL CHOLESTEROL, DIRECT: Direct LDL: 76 mg/dL

## 2020-06-25 LAB — BASIC METABOLIC PANEL
BUN: 23 mg/dL (ref 6–23)
CO2: 28 mEq/L (ref 19–32)
Calcium: 9.7 mg/dL (ref 8.4–10.5)
Chloride: 102 mEq/L (ref 96–112)
Creatinine, Ser: 0.85 mg/dL (ref 0.40–1.20)
GFR: 66.32 mL/min (ref >60.00)
Glucose, Bld: 94 mg/dL (ref 70–99)
Potassium: 4.7 mEq/L (ref 3.5–5.1)
Sodium: 140 mEq/L (ref 135–145)

## 2020-06-25 LAB — LIPID PANEL
Cholesterol: 140 mg/dL (ref 0–200)
HDL: 42 mg/dL (ref >39.00)
NonHDL: 98
Total CHOL/HDL Ratio: 3
Triglycerides: 201 mg/dL — ABNORMAL HIGH (ref 0.0–149.0)
VLDL: 40.2 mg/dL — ABNORMAL HIGH (ref 0.0–40.0)

## 2020-06-25 LAB — HEPATIC FUNCTION PANEL
ALT: 12 U/L (ref 0–35)
AST: 16 U/L (ref 0–37)
Albumin: 4.6 g/dL (ref 3.5–5.2)
Alkaline Phosphatase: 42 U/L (ref 39–117)
Bilirubin, Direct: 0.1 mg/dL (ref 0.0–0.3)
Total Bilirubin: 0.5 mg/dL (ref 0.2–1.2)
Total Protein: 7.4 g/dL (ref 6.0–8.3)

## 2020-06-25 MED ORDER — EMPAGLIFLOZIN 10 MG PO TABS: 10.0000 mg | ORAL_TABLET | Freq: Every day | ORAL | 0 refills | Status: DC

## 2020-06-25 MED ORDER — IRBESARTAN 150 MG PO TABS: 75.0000 mg | ORAL_TABLET | Freq: Two times a day (BID) | ORAL | 1 refills | Status: DC

## 2020-07-02 NOTE — Progress Notes (Signed)
Cardiology Office Note:    Date:  07/03/2020   ID:  Heather, Crawford 03-02-1945, MRN [id number]  PCP:  Heather Lima, MD   [facility] Providers Cardiologist:  Heather Mocha, MD Cardiology APP:  Heather Shi, PA-C  Electrophysiologist:  Heather Meredith Leeds, MD      Referring MD: Heather Lima, MD   Chief Complaint:  Follow-up (Hx of AVR, CAD, CHF)    Patient Profile:    Heather Crawford is a 75 y.o. female with:   Valvular heart disease, Rheumatic Fever as a child ? Aortic stenosis, s/p bioprosthetic AVR in 2012 ? Echo 3/21: EF 65-70, Gr 2 DD, normally functioning AVR  Mitral regurgitation ? Echo 3/21: Mild-moderate MR  Coronary artery disease  ? S/p CABG x 1 in 2012(RIMA-RCA) ? Myoview 3/21: No ischemia, EF 68; low risk  Diastolic CHF ? 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 The basic rhythm is normal sinus 2. There is no atrial fibrillation or flutter 3. There are sinus pauses and AV block with ventricular escape, 3.3 - 4 second pauses associated with symptoms of near-syncope  Carotid US 11/16/2019 Bilateral ICA 1-39; left subclavian stenosis  Myoview 05/03/2019 EF 68, small fixed apical defect, no ischemia, low risk  Echocardiogram 05/03/2019 EF 65-70, no RWMA, GRII DD, normal RVSF, RVSP 28.8, mild-moderate MR, s/p AVR-normally functioning, no PVL  Echocardiogram 10/14/2016 Mild LVH, EF 60-65,Normally functioning bioprosthetic aortic valve replacement(mean 14, peak 26), mild MR, moderate LAE  Event monitor 07/21/2016 Sinus rhythm, average heart rate 69, no significant arrhythmia; intermittent IVCD  Echocardiogram 10/10/2015 EF 60-65, normal wall motion, grade 1 diastolic dysfunction, normally functioning bioprosthetic aortic valve  replacement, mild MR, normal RV SF, PASP 27  Carotid US 10/20/2015 Bilateral ICA 1-39  Cardiac catheterization 04/03/2010 EF 55 RCA ostial 40-50, distal 90-95 LM normal LCx normal LAD mid 30 >>CABG   History of Present Illness: [name] was last seen in January 2022.  She continued to have exertional shortness of breath and jaw discomfort.  A Myoview in 3/21 was low risk.  I added Isosorbide to her medical regimen.  She returns for follow-up.  She is here alone.  She has not had any further jaw pain with exertion since starting on isosorbide.  She continues to have shortness of breath with certain activities.  This is not really gotten any worse.  She has not had orthopnea or significant leg edema.  She has not taken furosemide in quite some time.  She has not had syncope.  She was started on empagliflozin by primary care.  However, she could not afford this.        Past Medical History:  Diagnosis Date  . Allergic rhinitis, cause unspecified   . Aortic stenosis    a.  echo 1/12 w/mildly dilated LV, EF 45-50% w/paradoxical septal motion consistent w/ LBBB, moderate aortic stenosis w/mean gradient 27 mmHg, trivial AI, mild to moderate MR w/calcified mitral valve;   b. s/p AVR with 21 mm Magna Ease valve  . Aortic valve replaced 2012  . Arthritis    joint pain  . CAD (coronary artery disease)    a. s/p CABG 4/12: RIMA-RCA  . CAD, multiple vessel    sees Dr Heather Crawford every 3-4 months  . Cancer ([place])  skin sarcomas removed  . Carotid artery disease ([place])    Carotid US 10/21: Bilateral ICA 1-39; left subclavian stenosis  . CHF (congestive heart failure) (H[facility])    chronic diastolic CHF  . Chronic back pain    H/O BACK SURGERY  . Coronary artery disease    adenosine myoview 2/12 w/EF 56%, ISCHEMIA W/SCAR IN THE MID TO APICAL ANTERIO WALL, SEPTAL WALL, AND APEX. LHC 3/12 W/EF 55%, 40% OSTIAL RCA, 60% MID RCA, 95% DISTAL RC A, INTERVENTION  ATTEMPTED BUT UNABLE TO ADEQUADATELY SEAT   CATHETER.  . Diverticulitis of colon (without mention of hemorrhage)(562.11)   . Diverticulosis   . Esophageal reflux   . Glucose intolerance (impaired glucose tolerance)    A1C 6.3 (4/12)  . H/O hiatal hernia   . H/O vertigo    with fluid  in ears  . Heart murmur   . Hemorrhoids   . Hemorrhoids   . History of rheumatic fever   . Hx of hysterectomy   . Hx: UTI (urinary tract infection)   . Hyperlipidemia   . LBBB (left bundle branch block)   . Myocardial infarction ([place]) 2002  . Pneumonia   . PONV (postoperative nausea and vomiting)   . 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 Snoring   . Squamous cell carcinoma of skin    nose  . Urinary frequency     Current Medications: Current Meds  Medication Sig  . acetaminophen (TYLENOL) 325 MG tablet Take 1-2 tablets (325-650 mg total) by mouth every 4 (four) hours as needed for mild pain.  Marland Kitchen aspirin EC 81 MG tablet Take 1 tablet (81 mg total) by mouth daily. Swallow whole.  . Cholecalciferol 2000 units TABS Take 1 tablet (2,000 Units total) by mouth daily.  . fluticasone (FLONASE) 50 MCG/ACT nasal spray USE 1 SPRAY IN EACH NOSTRIL EVERY DAY  . furosemide (LASIX) 20 MG tablet TAKE 1 TABLET DAILY AS NEEDED FOR EDEMA OR WEIGHT GAIN. TAKE POTASSIUM 10MEQ WHEN YOU TAKE LASIX  . irbesartan (AVAPRO) 150 MG tablet Take 0.5 tablets (75 mg total) by mouth in the morning and at bedtime.  . isosorbide mononitrate (IMDUR) 30 MG 24 hr tablet Take 1 tablet (30 mg total) by mouth daily.  . meclizine (ANTIVERT) 12.5 MG tablet Take 1 tablet (12.5 mg total) by mouth 3 (three) times daily as needed for dizziness.  . Omega-3 Fatty Acids (FISH OIL) 1000 MG CAPS Take 1,000 mg by mouth daily.  Marland Kitchen omeprazole (PRILOSEC) 40 MG capsule Take 40 mg by mouth as needed (for heartburn).  . simvastatin (ZOCOR) 40 MG tablet TAKE 1 TABLET AT BEDTIME  . [DISCONTINUED] aspirin 325 MG EC tablet Take 1 tablet (325 mg total) by mouth daily. Ok to resume Friday per Dr. Curt Crawford      Allergies:   Crestor [rosuvastatin], Lipitor [atorvastatin], Sulfamethoxazole-trimethoprim, Tape, Amoxicillin, Neomycin, Neosporin [neomycin-bacitracin zn-polymyx], and Polysporin [bacitracin-polymyxin b]   Social History   Tobacco Use  . Smoking status: Former Smoker    Packs/day: 1.00    Years: 40.00    Pack years: 40.00    Types: Cigarettes    Quit date: 02/03/2000    Years since quitting: 20.4  . Smokeless tobacco: Former Systems developer    Types: Chew  . Tobacco comment: discussed LDCT   Vaping Use  . Vaping Use: Never used  Substance Use Topics  . Alcohol use: No    Alcohol/week: 0.0 standard drinks  . Drug use: No  Family Hx: The patient's family history includes Breast cancer in her maternal aunt, mother, paternal grandmother, and sister; CAD in her father; Colon cancer in her maternal aunt and paternal uncle; Diabetes type II in her mother; Heart attack in her father; Heart disease in her mother; Hypertension in her mother; Hypothyroidism in her mother; Pancreatic cancer in an other family member. There is no history of Stomach cancer, Throat cancer, or Esophageal cancer.  Review of Systems  Musculoskeletal: Positive for joint pain.     EKGs/Labs/Other Test Reviewed:    EKG:  EKG is not ordered today.  The ekg ordered today demonstrates n/a  Recent Labs: 06/24/2020: ALT 12; BUN 23; Creatinine, Ser 0.85; Hemoglobin 12.1; Platelets 227.0; Potassium 4.7; Sodium 140; TSH 3.78   Recent Lipid Panel Lab Results  Component Value Date/Time   CHOL 140 06/24/2020 01:46 PM   CHOL 150 01/06/2018 11:13 AM   TRIG 201.0 (H) 06/24/2020 01:46 PM   HDL 42.00 06/24/2020 01:46 PM   HDL 42 01/06/2018 11:13 AM   LDLCALC 61 01/06/2018 11:13 AM   LDLDIRECT 76.0 06/24/2020 01:46 PM      Risk Assessment/Calculations:      Physical Exam:    VS:  BP 140/60   Pulse 64   Ht 5\' 3"  (1.6 m)   Wt 162 lb 12.8 oz (73.8 kg)   SpO2 96%   BMI 28.84 kg/m     Wt Readings from Last 3  Encounters:  07/03/20 162 lb 12.8 oz (73.8 kg)  06/24/20 163 lb (73.9 kg)  06/19/20 163 lb 9.6 oz (74.2 kg)     Constitutional:      Appearance: Healthy appearance. Not in distress.  Neck:     Vascular: No JVR. JVD normal.  Pulmonary:     Effort: Pulmonary effort is normal.     Breath sounds: No wheezing. No rales.  Cardiovascular:     Normal rate. Regular rhythm. Normal S1. Normal S2.     Murmurs: There is a grade 2/6 systolic murmur at the URSB.  Edema:    Peripheral edema absent.  Abdominal:     Palpations: Abdomen is soft. There is no hepatomegaly.  Skin:    General: Skin is warm and dry.  Neurological:     Mental Status: Alert and oriented to person, place and time.     Cranial Nerves: Cranial nerves are intact.         ASSESSMENT & PLAN:    1. Coronary artery disease involving native coronary artery of native heart with angina pectoris ([place]) S/p CABG x1 in 2012.  Myoview 3/21 low risk and negative for ischemia.  She is doing well without further anginal symptoms on her current medical regimen.  Continue aspirin, statin, isosorbide.  Follow-up in 6 months.  2. Chronic heart failure with preserved ejection fraction ([place]) 3. Shortness of breath She has chronic shortness of breath with exertion.  She does not really have any evidence of volume excess on exam.  She does not take furosemide on a daily basis.  Obtain BNP today.  If this is significantly elevated, I Heather adjust her furosemide or consider adding spironolactone, SGLT2 inhibitor, Entresto.  As noted above, she did have difficulty affording empagliflozin.  If needed, we can get her into our pharmacy clinic to see if we can get coverage.  4. S/P AVR (aortic valve replacement) Normally functioning aortic valve prosthesis by echocardiogram in 3/21.  Continue SBE prophylaxis.  5. Essential hypertension Blood pressure is optimal at  home.  It tends to run higher in the clinic.  Continue current management with   irbesartan, isosorbide.  6. Hyperlipidemia with target LDL less than 70 Recent LDL above goal.  Triglycerides also above goal.  I have asked her to continue to monitor her diet and repeat lipids in 6 months.  Of note, she has a lot of joint pain.  She has been intolerant of rosuvastatin and atorvastatin in the past.  I have asked her to stop her simvastatin for 2 to 3 weeks.  If her symptoms resolve, I Heather get her set up with our pharmacy clinic to consider PCSK9 inhibitor therapy.  7. Pacemaker Follow-up with EP as planned     Dispo:  Return in about 6 months (around 01/02/2021) for Routine Follow Up, w/ Dr. Burt Knack, or Richardson Dopp, PA-C.   Medication Adjustments/Labs and Tests Ordered: Current medicines are reviewed at length with the patient today.  Concerns regarding medicines are outlined above.  Tests Ordered: Orders Placed This Encounter  Procedures  . Pro b natriuretic peptide  . Lipid Profile   Medication Changes: Meds ordered this encounter  Medications  . aspirin EC 81 MG tablet    Sig: Take 1 tablet (81 mg total) by mouth daily. Swallow whole.    Dispense:  90 tablet    Refill:  3    Signed, Richardson Dopp, PA-C  07/03/2020 10:28 AM    [facility] [address], [place], [place]  [address] Phone: [phone]; Fax: [phone]

## 2020-07-03 ENCOUNTER — Other Ambulatory Visit: Payer: Self-pay

## 2020-07-03 ENCOUNTER — Ambulatory Visit: Payer: Medicare HMO | Admitting: Physician Assistant

## 2020-07-03 ENCOUNTER — Encounter: Payer: Self-pay | Admitting: Physician Assistant

## 2020-07-03 VITALS — BP 140/60 | HR 64 | Ht 63.0 in | Wt 162.8 lb

## 2020-07-03 DIAGNOSIS — Z952 Presence of prosthetic heart valve: Secondary | ICD-10-CM

## 2020-07-03 DIAGNOSIS — I25119 Atherosclerotic heart disease of native coronary artery with unspecified angina pectoris: Secondary | ICD-10-CM | POA: Diagnosis not present

## 2020-07-03 DIAGNOSIS — E785 Hyperlipidemia, unspecified: Secondary | ICD-10-CM | POA: Diagnosis not present

## 2020-07-03 DIAGNOSIS — R0602 Shortness of breath: Secondary | ICD-10-CM | POA: Diagnosis not present

## 2020-07-03 DIAGNOSIS — Z950 Presence of cardiac pacemaker: Secondary | ICD-10-CM

## 2020-07-03 DIAGNOSIS — I6523 Occlusion and stenosis of bilateral carotid arteries: Secondary | ICD-10-CM

## 2020-07-03 DIAGNOSIS — I5032 Chronic diastolic (congestive) heart failure: Secondary | ICD-10-CM

## 2020-07-03 DIAGNOSIS — I10 Essential (primary) hypertension: Secondary | ICD-10-CM | POA: Diagnosis not present

## 2020-07-03 MED ORDER — ASPIRIN EC 81 MG PO TBEC: 81.0000 mg | DELAYED_RELEASE_TABLET | Freq: Every day | ORAL | 3 refills | Status: DC

## 2020-07-03 NOTE — Patient Instructions (Signed)
Medication Instructions:  Your physician has recommended you make the following change in your medication:   1.  Hold Simvastatin 2-3 weeks, if body aches resolve call office @ [phone] or send mychart message. If body      aches DO NOT resolve resume Simvastatin.   2.  Decrease Asprin one tablet by mouth ( 81 mg) daily.    *If you need a refill on your cardiac medications before your next appointment, please call your pharmacy*   Lab Work: TODAY!!!! PRO BNP  If you have labs (blood work) drawn today and your tests are completely normal, you will receive your results only by: Marland Kitchen MyChart Message (if you have MyChart) OR . A paper copy in the mail If you have any lab test that is abnormal or we need to change your treatment, we will call you to review the results.   Testing/Procedures: -NONE   Follow-Up: At [facility], you and your health needs are our priority.  As part of our continuing mission to provide you with exceptional heart care, we have created designated Provider Care Teams.  These Care Teams include your primary Cardiologist (physician) and Advanced Practice Providers (APPs -  Physician Assistants and Nurse Practitioners) who all work together to provide you with the care you need, when you need it.  We recommend signing up for the patient portal called "MyChart".  Sign up information is provided on this After Visit Summary.  MyChart is used to connect with patients for Virtual Visits (Telemedicine).  Patients are able to view lab/test results, encounter notes, upcoming appointments, etc.  Non-urgent messages can be sent to your provider as well.   To learn more about what you can do with MyChart, go to [email or web address].    Your next appointment:   6 month(s)  The format for your next appointment:   In Person  Provider:   You may see Sherren Mocha, MD or one of the following Advanced Practice Providers on your designated Care Team:    Richardson Dopp,  PA-C with fasting lipid.      Other Instructionsting from midnight the Your physician wants you to follow-up in: 6 months with Dr. Burt Knack or Richardson Dopp, PA, fasting from the night before.  You will receive a reminder letter in the mail two months in advance. If you don't receive a letter, please call our office to schedule the follow-up appointment.

## 2020-07-04 LAB — PRO B NATRIURETIC PEPTIDE: NT-Pro BNP: 364 pg/mL (ref 0–738)

## 2020-07-18 ENCOUNTER — Ambulatory Visit (INDEPENDENT_AMBULATORY_CARE_PROVIDER_SITE_OTHER): Payer: Medicare HMO

## 2020-07-18 DIAGNOSIS — I442 Atrioventricular block, complete: Secondary | ICD-10-CM

## 2020-07-18 LAB — CUP PACEART REMOTE DEVICE CHECK
Battery Remaining Longevity: 177 mo
Battery Voltage: 3.2 V
Brady Statistic AP VP Percent: 0.03 %
Brady Statistic AP VS Percent: 20.7 %
Brady Statistic AS VP Percent: 0.04 %
Brady Statistic AS VS Percent: 79.23 %
Brady Statistic RA Percent Paced: 20.8 %
Brady Statistic RV Percent Paced: 0.07 %
Date Time Interrogation Session: 20220615210837
Implantable Lead Implant Date: 20211215
Implantable Lead Implant Date: 20211215
Implantable Lead Location: 753859
Implantable Lead Location: 753860
Implantable Lead Model: 5076
Implantable Lead Model: 5076
Implantable Pulse Generator Implant Date: 20211215
Lead Channel Impedance Value: 285 Ohm
Lead Channel Impedance Value: 437 Ohm
Lead Channel Impedance Value: 627 Ohm
Lead Channel Impedance Value: 741 Ohm
Lead Channel Pacing Threshold Amplitude: 0.625 V
Lead Channel Pacing Threshold Amplitude: 0.625 V
Lead Channel Pacing Threshold Pulse Width: 0.4 ms
Lead Channel Pacing Threshold Pulse Width: 0.4 ms
Lead Channel Sensing Intrinsic Amplitude: 1.75 mV
Lead Channel Sensing Intrinsic Amplitude: 1.75 mV
Lead Channel Sensing Intrinsic Amplitude: 17.25 mV
Lead Channel Sensing Intrinsic Amplitude: 17.25 mV
Lead Channel Setting Pacing Amplitude: 1.5 V
Lead Channel Setting Pacing Amplitude: 2 V
Lead Channel Setting Pacing Pulse Width: 0.4 ms
Lead Channel Setting Sensing Sensitivity: 1.2 mV

## 2020-08-08 NOTE — Progress Notes (Signed)
Remote pacemaker transmission.   

## 2020-09-15 ENCOUNTER — Encounter: Payer: Self-pay | Admitting: Internal Medicine

## 2020-09-17 ENCOUNTER — Encounter: Payer: Self-pay | Admitting: Physician Assistant

## 2020-09-17 ENCOUNTER — Ambulatory Visit (INDEPENDENT_AMBULATORY_CARE_PROVIDER_SITE_OTHER): Payer: Medicare HMO | Admitting: Physician Assistant

## 2020-09-17 ENCOUNTER — Other Ambulatory Visit: Payer: Self-pay

## 2020-09-17 DIAGNOSIS — L570 Actinic keratosis: Secondary | ICD-10-CM | POA: Diagnosis not present

## 2020-09-17 DIAGNOSIS — L905 Scar conditions and fibrosis of skin: Secondary | ICD-10-CM | POA: Diagnosis not present

## 2020-09-17 DIAGNOSIS — L304 Erythema intertrigo: Secondary | ICD-10-CM

## 2020-09-17 DIAGNOSIS — Z1283 Encounter for screening for malignant neoplasm of skin: Secondary | ICD-10-CM

## 2020-09-17 DIAGNOSIS — D485 Neoplasm of uncertain behavior of skin: Secondary | ICD-10-CM

## 2020-09-17 MED ORDER — KLISYRI 1 % EX OINT: TOPICAL_OINTMENT | CUTANEOUS | 0 refills | Status: DC

## 2020-09-17 NOTE — Progress Notes (Signed)
   Follow-Up Visit   Subjective  Heather Crawford is a 75 y.o. female who presents for the following: Annual Exam (Here for annual skin exam. Concerns right check. 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 History of non mole skin cancers. ).   The following portions of the chart were reviewed this encounter and updated as appropriate:  Tobacco  Allergies  Meds  Problems  Med Hx  Surg Hx  Fam Hx      Objective  Well appearing patient in no apparent distress; mood and affect are within normal limits.  A full examination was performed including scalp, head, eyes, ears, nose, lips, neck, chest, axillae, abdomen, back, buttocks, bilateral upper extremities, bilateral lower extremities, hands, feet, fingers, toes, fingernails, and toenails. All findings within normal limits unless otherwise noted below.  Right Buccal Cheek White macule with surrounding pink scale       Mid Forehead Erythematous patches with gritty scale.  Pubic, Right Inframammary Fold Erythema with slight scale   Assessment & Plan  Neoplasm of uncertain behavior of skin Right Buccal Cheek  Skin / nail biopsy Type of biopsy: tangential   Informed consent: discussed and consent obtained   Timeout: patient name, date of birth, surgical site, and procedure verified   Procedure prep:  Patient was prepped and draped in usual sterile fashion (Non sterile) Prep type:  Chlorhexidine Anesthesia: the lesion was anesthetized in a standard fashion   Anesthetic:  1% lidocaine w/ epinephrine 1-100,000 local infiltration Instrument used: flexible razor blade   Outcome: patient tolerated procedure well   Post-procedure details: wound care instructions given    Destruction of lesion Complexity: simple   Destruction method: electrodesiccation and curettage   Informed consent: discussed and consent obtained   Timeout:  patient name, date of birth, surgical site, and procedure verified Anesthesia: the lesion  was anesthetized in a standard fashion   Anesthetic:  1% lidocaine w/ epinephrine 1-100,000 local infiltration Curettage performed in three different directions: Yes   Curettage cycles:  1 Margin per side (cm):  0.1 Final wound size (cm):  2 Hemostasis achieved with:  aluminum chloride Outcome: patient tolerated procedure well with no complications   Post-procedure details: wound care instructions given    Specimen 1 - Surgical pathology Differential Diagnosis: bcc vs [id number]  Check Margins: No  AK (actinic keratosis) Mid Forehead  Tirbanibulin (KLISYRI) 1 % OINT - Mid Forehead Apply as directed  Erythema intertrigo Pubic; Right Inframammary Fold  Otc clotrimazole & hydrocortisone    I, Reverie Vaquera, PA-C, have reviewed all documentation's for this visit.  The documentation on 10/01/20 for the exam, diagnosis, procedures and orders are all accurate and complete.

## 2020-09-17 NOTE — Patient Instructions (Signed)
Over the counter- Clotrimazole & hydrocortisone  Biopsy, Surgery (Curettage) & Surgery (Excision) Aftercare Instructions  1. Okay to remove bandage in 24 hours  2. Wash area with soap and water  3. Apply Vaseline to area twice daily until healed (Not Neosporin)  4. Okay to cover with a Band-Aid to decrease the chance of infection or prevent irritation from clothing; also it's okay to uncover lesion at home.  5. Suture instructions: return to our office in 7-10 or 10-14 days for a nurse visit for suture removal. Variable healing with sutures, if pain or itching occurs call our office. It's okay to shower or bathe 24 hours after sutures are given.  6. The following risks may occur after a biopsy, curettage or excision: bleeding, scarring, discoloration, recurrence, infection (redness, yellow drainage, pain or swelling).  7. For questions, concerns and results call our office at [place] before 4pm & Friday before 3pm. Biopsy results will be available in 1 week.

## 2020-10-05 ENCOUNTER — Other Ambulatory Visit: Payer: Self-pay | Admitting: Cardiovascular Disease

## 2020-10-05 DIAGNOSIS — E785 Hyperlipidemia, unspecified: Secondary | ICD-10-CM

## 2020-10-17 ENCOUNTER — Ambulatory Visit (INDEPENDENT_AMBULATORY_CARE_PROVIDER_SITE_OTHER): Payer: Medicare HMO

## 2020-10-17 DIAGNOSIS — I442 Atrioventricular block, complete: Secondary | ICD-10-CM

## 2020-10-19 LAB — CUP PACEART REMOTE DEVICE CHECK
Battery Remaining Longevity: 173 mo
Battery Voltage: 3.18 V
Brady Statistic AP VP Percent: 0.02 %
Brady Statistic AP VS Percent: 16.65 %
Brady Statistic AS VP Percent: 0.04 %
Brady Statistic AS VS Percent: 83.29 %
Brady Statistic RA Percent Paced: 16.76 %
Brady Statistic RV Percent Paced: 0.06 %
Date Time Interrogation Session: 20220915082107
Implantable Lead Implant Date: 20211215
Implantable Lead Implant Date: 20211215
Implantable Lead Location: 753859
Implantable Lead Location: 753860
Implantable Lead Model: 5076
Implantable Lead Model: 5076
Implantable Pulse Generator Implant Date: 20211215
Lead Channel Impedance Value: 285 Ohm
Lead Channel Impedance Value: 437 Ohm
Lead Channel Impedance Value: 608 Ohm
Lead Channel Impedance Value: 703 Ohm
Lead Channel Pacing Threshold Amplitude: 0.5 V
Lead Channel Pacing Threshold Amplitude: 0.75 V
Lead Channel Pacing Threshold Pulse Width: 0.4 ms
Lead Channel Pacing Threshold Pulse Width: 0.4 ms
Lead Channel Sensing Intrinsic Amplitude: 16.75 mV
Lead Channel Sensing Intrinsic Amplitude: 16.75 mV
Lead Channel Sensing Intrinsic Amplitude: 2 mV
Lead Channel Sensing Intrinsic Amplitude: 2 mV
Lead Channel Setting Pacing Amplitude: 1.75 V
Lead Channel Setting Pacing Amplitude: 2 V
Lead Channel Setting Pacing Pulse Width: 0.4 ms
Lead Channel Setting Sensing Sensitivity: 1.2 mV

## 2020-10-23 ENCOUNTER — Other Ambulatory Visit: Payer: Self-pay | Admitting: Internal Medicine

## 2020-10-23 DIAGNOSIS — J309 Allergic rhinitis, unspecified: Secondary | ICD-10-CM

## 2020-10-24 NOTE — Progress Notes (Signed)
Remote pacemaker transmission.   

## 2020-11-15 ENCOUNTER — Other Ambulatory Visit: Payer: Self-pay

## 2020-11-15 ENCOUNTER — Ambulatory Visit (HOSPITAL_COMMUNITY)
Admission: RE | Admit: 2020-11-15 | Discharge: 2020-11-15 | Disposition: A | Discharge status: Home / Self Care | Payer: Medicare HMO | Source: Ambulatory Visit | Attending: Internal Medicine | Admitting: Internal Medicine

## 2020-11-15 DIAGNOSIS — I779 Disorder of arteries and arterioles, unspecified: Secondary | ICD-10-CM | POA: Insufficient documentation

## 2020-11-15 DIAGNOSIS — I6523 Occlusion and stenosis of bilateral carotid arteries: Secondary | ICD-10-CM | POA: Diagnosis not present

## 2020-11-18 ENCOUNTER — Other Ambulatory Visit: Payer: Self-pay | Admitting: *Deleted

## 2020-11-18 ENCOUNTER — Encounter: Payer: Self-pay | Admitting: Physician Assistant

## 2020-11-18 DIAGNOSIS — I771 Stricture of artery: Secondary | ICD-10-CM | POA: Insufficient documentation

## 2020-11-18 DIAGNOSIS — I779 Disorder of arteries and arterioles, unspecified: Secondary | ICD-10-CM

## 2020-12-09 ENCOUNTER — Other Ambulatory Visit: Payer: Self-pay | Admitting: Internal Medicine

## 2020-12-13 ENCOUNTER — Other Ambulatory Visit: Payer: Self-pay | Admitting: Physician Assistant

## 2021-01-02 DIAGNOSIS — Z9011 Acquired absence of right breast and nipple: Secondary | ICD-10-CM | POA: Diagnosis not present

## 2021-01-16 ENCOUNTER — Ambulatory Visit (INDEPENDENT_AMBULATORY_CARE_PROVIDER_SITE_OTHER): Payer: Medicare HMO

## 2021-01-16 DIAGNOSIS — I442 Atrioventricular block, complete: Secondary | ICD-10-CM

## 2021-01-16 LAB — CUP PACEART REMOTE DEVICE CHECK
Battery Remaining Longevity: 171 mo
Battery Voltage: 3.14 V
Brady Statistic AP VP Percent: 0.02 %
Brady Statistic AP VS Percent: 10.75 %
Brady Statistic AS VP Percent: 0.04 %
Brady Statistic AS VS Percent: 89.2 %
Brady Statistic RA Percent Paced: 10.82 %
Brady Statistic RV Percent Paced: 0.05 %
Date Time Interrogation Session: 20221215095947
Implantable Lead Implant Date: 20211215
Implantable Lead Implant Date: 20211215
Implantable Lead Location: 753859
Implantable Lead Location: 753860
Implantable Lead Model: 5076
Implantable Lead Model: 5076
Implantable Pulse Generator Implant Date: 20211215
Lead Channel Impedance Value: 285 Ohm
Lead Channel Impedance Value: 361 Ohm
Lead Channel Impedance Value: 608 Ohm
Lead Channel Impedance Value: 665 Ohm
Lead Channel Pacing Threshold Amplitude: 0.5 V
Lead Channel Pacing Threshold Amplitude: 0.625 V
Lead Channel Pacing Threshold Pulse Width: 0.4 ms
Lead Channel Pacing Threshold Pulse Width: 0.4 ms
Lead Channel Sensing Intrinsic Amplitude: 0.875 mV
Lead Channel Sensing Intrinsic Amplitude: 0.875 mV
Lead Channel Sensing Intrinsic Amplitude: 16.5 mV
Lead Channel Sensing Intrinsic Amplitude: 16.5 mV
Lead Channel Setting Pacing Amplitude: 1.5 V
Lead Channel Setting Pacing Amplitude: 2 V
Lead Channel Setting Pacing Pulse Width: 0.4 ms
Lead Channel Setting Sensing Sensitivity: 1.2 mV

## 2021-01-17 ENCOUNTER — Telehealth: Payer: Self-pay | Admitting: Internal Medicine

## 2021-01-17 NOTE — Telephone Encounter (Signed)
1.Medication Requested: Irbesartan  2. Pharmacy (Name, Street, [place]): [facility] [place]  3. On Med List: yes   4. Last Visit with PCP: 5.23.22  5. Next visit date with PCP: 10. 5.31.23   Agent: Please be advised that RX refills may take up to 3 business days. We ask that you follow-up with your pharmacy.   Patient is completely out of medicine. Needs short fill

## 2021-01-18 ENCOUNTER — Other Ambulatory Visit: Payer: Self-pay | Admitting: Internal Medicine

## 2021-01-18 DIAGNOSIS — I10 Essential (primary) hypertension: Secondary | ICD-10-CM

## 2021-01-18 DIAGNOSIS — E118 Type 2 diabetes mellitus with unspecified complications: Secondary | ICD-10-CM

## 2021-01-18 MED ORDER — IRBESARTAN 150 MG PO TABS: 75.0000 mg | ORAL_TABLET | Freq: Two times a day (BID) | ORAL | 1 refills | Status: DC

## 2021-01-20 ENCOUNTER — Other Ambulatory Visit: Payer: Self-pay | Admitting: Internal Medicine

## 2021-01-20 DIAGNOSIS — E118 Type 2 diabetes mellitus with unspecified complications: Secondary | ICD-10-CM

## 2021-01-20 DIAGNOSIS — I10 Essential (primary) hypertension: Secondary | ICD-10-CM

## 2021-01-28 NOTE — Progress Notes (Signed)
Remote pacemaker transmission.   

## 2021-02-09 DIAGNOSIS — J9621 Acute and chronic respiratory failure with hypoxia: Secondary | ICD-10-CM | POA: Diagnosis not present

## 2021-02-09 DIAGNOSIS — J209 Acute bronchitis, unspecified: Secondary | ICD-10-CM | POA: Diagnosis not present

## 2021-02-09 DIAGNOSIS — M791 Myalgia, unspecified site: Secondary | ICD-10-CM | POA: Diagnosis not present

## 2021-02-09 DIAGNOSIS — J9801 Acute bronchospasm: Secondary | ICD-10-CM | POA: Diagnosis not present

## 2021-02-09 DIAGNOSIS — Z20828 Contact with and (suspected) exposure to other viral communicable diseases: Secondary | ICD-10-CM | POA: Diagnosis not present

## 2021-03-12 ENCOUNTER — Other Ambulatory Visit: Payer: Self-pay | Admitting: Cardiology

## 2021-03-12 DIAGNOSIS — Z952 Presence of prosthetic heart valve: Secondary | ICD-10-CM

## 2021-03-13 DIAGNOSIS — I5030 Unspecified diastolic (congestive) heart failure: Secondary | ICD-10-CM | POA: Insufficient documentation

## 2021-03-13 NOTE — Progress Notes (Signed)
Cardiology Office Note:    Date:  03/14/2021   ID:  Doll, Frazee 1945-09-05, MRN [id number]  PCP:  Janith Lima, MD  [facility] Providers Cardiologist:  Sherren Mocha, MD Cardiology APP:  Liliane Shi, PA-C  Electrophysiologist:  Will Meredith Leeds, MD    Referring MD: Janith Lima, MD   Chief Complaint:  Shortness of Breath    Patient Profile: Valvular heart disease, Rheumatic Fever as a child Aortic stenosis, s/p bioprosthetic AVR in 2012 Echo 3/21: EF 68-70, Gr 2 DD, normally functioning AVR Mitral regurgitation Echo 3/21: Mild-moderate MR Coronary artery disease  S/p CABG x 1 in 2012 (RIMA-RCA) Myoview 3/21: No ischemia, EF 68; low risk Chest pain improved on nitrates  (HFpEF) heart failure with preserved ejection fraction  Echo 3/21: EF 25-70, GRII DD Carotid artery disease [place] 10/22: Bilateral ICA 1-39 Left subclavian stenosis LBBB Symptomatic bradycardia S/p Pacemaker in 12/21 Vertigo 2/2 labyrinthitis  GERD Impaired glucose tolerance  Prior CV Studies: Carotid US 11/15/2020 Bilateral ICA 1-39; left subclavian stenosis  Myoview 05/03/2019 EF 68, small fixed apical defect, no ischemia, low risk   Echocardiogram 05/03/2019 EF 65-70, no RWMA, GRII DD, normal RVSF, RVSP 28.8, mild-moderate MR, s/p AVR -normally functioning, no PVL   Echocardiogram 10/14/2016 Mild LVH, EF 60-65, Normally functioning bioprosthetic aortic valve replacement (mean 14, peak 26), mild MR, moderate LAE   Cardiac catheterization 04/03/2010 EF 55 RCA ostial 40-50, distal 90-95 LM normal LCx normal LAD mid 30 >> CABG  History of Present Illness:   Heather Crawford is a 76 y.o. female with the above problem list.  She was last seen on 6/22.  She had been started on long-acting nitrates for exertional jaw pain.  Her symptoms were improved.  She returns for follow-up.    She is here alone.  She notes that she recently went to urgent care in [facility] for worsening  shortness of breath.  She has 2 daughters that are nurses and, upon their evaluation, her O2 sats were 88%.  She was placed on prednisone, azithromycin and an inhaler.  Her symptoms improved somewhat.  However, she continues to have shortness of breath with just minimal exertion.  She also notes that her throat discomfort with exertion has returned.  She has not had syncope.  She sleeps on an incline chronically.  She has not had significant lower extremity edema.    Past Medical History:  Diagnosis Date   Allergic rhinitis, cause unspecified    Aortic stenosis    a.  echo 1/12 w/mildly dilated LV, EF 45-50% w/paradoxical septal motion consistent w/ LBBB, moderate aortic stenosis w/mean gradient 27 mmHg, trivial AI, mild to moderate MR w/calcified mitral valve;   b. s/p AVR with 21 mm Magna Ease valve   Aortic valve replaced 2012   Arthritis    joint pain   CAD (coronary artery disease)    a. s/p CABG 4/12: RIMA-RCA   CAD, multiple vessel    sees Dr Aundra Dubin every 3-4 months   Cancer [facility])    skin sarcomas removed   Carotid artery disease ([place])    Carotid US 10/21: Bilateral ICA 1-39; left subclavian stenosis   CHF (congestive heart failure) (HCC)    chronic diastolic CHF   Chronic back pain    H/O BACK SURGERY   Coronary artery disease    adenosine myoview 2/12 w/EF 56%, ISCHEMIA W/SCAR IN THE MID TO APICAL ANTERIO WALL, SEPTAL WALL, AND APEX. [place] 3/12 W/EF  55%, 40% OSTIAL RCA, 60% MID RCA, 95% DISTAL RC A, INTERVENTION  ATTEMPTED BUT UNABLE TO ADEQUADATELY SEAT  CATHETER.   Diverticulitis of colon (without mention of hemorrhage)(562.11)    Diverticulosis    Esophageal reflux    Glucose intolerance (impaired glucose tolerance)    A1C 6.3 (4/12)   H/O hiatal hernia    H/O vertigo    with fluid  in ears   Heart murmur    Hemorrhoids    Hemorrhoids    History of rheumatic fever    Hx of hysterectomy    Hx: UTI (urinary tract infection)    Hyperlipidemia    LBBB (left bundle branch  block)    Myocardial infarction ([place]) 2002   Pneumonia    PONV (postoperative nausea and vomiting)    PPD positive    in the past   Snoring    Squamous cell carcinoma of skin    nose   Stenosis of left subclavian artery ([place])    Carotid US 10/22: Bilateral ICA 1-39; L subclavian stenosis   Urinary frequency    Current Medications: Current Meds  Medication Sig   acetaminophen (TYLENOL) 325 MG tablet Take 1-2 tablets (325-650 mg total) by mouth every 4 (four) hours as needed for mild pain.   albuterol (VENTOLIN HFA) 108 (90 Base) MCG/ACT inhaler Inhale 2 puffs into the lungs 2 (two) times daily.   amLODipine (NORVASC) 2.5 MG tablet Take 1 tablet (2.5 mg total) by mouth daily.   aspirin EC 81 MG tablet Take 1 tablet (81 mg total) by mouth daily. Swallow whole.   Cholecalciferol 2000 units TABS Take 1 tablet (2,000 Units total) by mouth daily.   fluticasone (FLONASE) 50 MCG/ACT nasal spray USE 1 SPRAY IN EACH NOSTRIL EVERY DAY   furosemide (LASIX) 20 MG tablet TAKE 1 TABLET DAILY AS NEEDED FOR EDEMA OR WEIGHT GAIN. TAKE POTASSIUM 10MEQ WHEN YOU TAKE LASIX   irbesartan (AVAPRO) 150 MG tablet Take 0.5 tablets (75 mg total) by mouth in the morning and at bedtime.   isosorbide mononitrate (IMDUR) 30 MG 24 hr tablet TAKE 1 TABLET EVERY DAY   meclizine (ANTIVERT) 12.5 MG tablet Take 1 tablet (12.5 mg total) by mouth 3 (three) times daily as needed for dizziness.   nitroGLYCERIN (NITROSTAT) 0.4 MG SL tablet Place 1 tablet (0.4 mg total) under the tongue every 5 (five) minutes as needed for chest pain.   Omega-3 Fatty Acids (FISH OIL) 1000 MG CAPS Take 1,000 mg by mouth daily.   omeprazole (PRILOSEC) 40 MG capsule TAKE 1 CAPSULE (40 MG TOTAL) BY MOUTH DAILY.   potassium chloride (KLOR-CON) 10 MEQ tablet Take 1 tablet (10 mEq total) by mouth as needed. Take with lasix only.   Tirbanibulin (KLISYRI) 1 % OINT Apply as directed   [DISCONTINUED] simvastatin (ZOCOR) 40 MG tablet Patient taking 1/2  tablet (20 mg Total) by mouth at bedtime    Allergies:   Crestor [rosuvastatin], Lipitor [atorvastatin], Sulfamethoxazole-trimethoprim, Tape, Amoxicillin, Neomycin, Neosporin [neomycin-bacitracin zn-polymyx], and Polysporin [bacitracin-polymyxin b]   Social History   Tobacco Use   Smoking status: Former    Packs/day: 1.00    Years: 40.00    Pack years: 40.00    Types: Cigarettes    Quit date: 02/03/2000    Years since quitting: 21.1   Smokeless tobacco: Former    Types: Chew   Tobacco comments:    discussed LDCT   Vaping Use   Vaping Use: Never used  Substance Use Topics  Alcohol use: No    Alcohol/week: 0.0 standard drinks   Drug use: No    Family Hx: The patient's family history includes Breast cancer in her maternal aunt, mother, paternal grandmother, and sister; CAD in her father; Colon cancer in her maternal aunt and paternal uncle; Diabetes type II in her mother; Heart attack in her father; Heart disease in her mother; Hypertension in her mother; Hypothyroidism in her mother; Pancreatic cancer in an other family member. There is no history of Stomach cancer, Throat cancer, or Esophageal cancer.  Review of Systems  Constitutional: Negative for fever.  Respiratory:  Positive for cough.   Skin:  Negative for rash.  Gastrointestinal:  Negative for hematochezia, melena and vomiting.  Genitourinary:  Negative for hematuria.  Neurological:  Positive for dizziness (with bending over).    EKGs/Labs/Other Test Reviewed:    EKG:  EKG is  ordered today.  The ekg ordered today demonstrates NSR, HR 89, normal axis, left bundle branch block, no change from prior x-rays  Recent Labs: 06/24/2020: ALT 12; BUN 23; Creatinine, Ser 0.85; Hemoglobin 12.1; Platelets 227.0; Potassium 4.7; Sodium 140; TSH 3.78 07/03/2020: NT-Pro BNP 364   Recent Lipid Panel Recent Labs    06/24/20 1346  CHOL 140  TRIG 201.0*  HDL 42.00  VLDL 40.2*  LDLDIRECT 76.0     Risk Assessment/Calculations:          Physical Exam:    VS:  BP 136/60 (BP Location: Right Arm, Patient Position: Sitting, Cuff Size: Large)    Pulse 96    Ht 5\' 3"  (1.6 m)    Wt 162 lb 3.2 oz (73.6 kg)    SpO2 96%    BMI 28.73 kg/m     Wt Readings from Last 3 Encounters:  03/14/21 162 lb 3.2 oz (73.6 kg)  07/03/20 162 lb 12.8 oz (73.8 kg)  06/24/20 163 lb (73.9 kg)    Constitutional:      Appearance: Healthy appearance. Not in distress.  Neck:     Vascular: No JVR. JVD normal.  Pulmonary:     Effort: Pulmonary effort is normal.     Breath sounds: No wheezing. No rales.  Cardiovascular:     Normal rate. Regular rhythm. Normal S1. Normal S2.      Murmurs: There is a grade 2/6 systolic murmur at the URSB.  Edema:    Peripheral edema absent.  Abdominal:     Palpations: Abdomen is soft.  Skin:    General: Skin is warm and dry.  Neurological:     Mental Status: Alert and oriented to person, place and time.     Cranial Nerves: Cranial nerves are intact.        ASSESSMENT & PLAN:   CAD (coronary artery disease) S/p CABG in 2012 (RIMA-RCA).  Myoview in March 2021 was low risk.  She did have improved symptoms with long-acting nitrates.  However, she has had recurrent exertional throat discomfort.  She also has fairly chronic shortness of breath with exertion.  This has gotten worse recently.  She also had what sounds like a COPD exacerbation several weeks ago.  Given the recurrence of her anginal equivalent and worsening shortness of breath, I have recommended that we proceed with right and left heart catheterization.  I discussed this with Dr. Ali Lowe (attending MD) who agreed.  She has to help her daughter for the next couple of weeks and cannot schedule her heart catheterization until the beginning of March.  I will also obtain  a follow-up echocardiogram.  If her cardiac work-up is fairly unremarkable, she will need referral to pulmonology for further evaluation of her dyspnea. Continue aspirin 81 mg daily, isosorbide  mononitrate 30 mg daily She has difficulty tolerating statins Start amlodipine 2.5 mg daily, NTG as needed Schedule right and left heart catheterization Follow-up post catheterization  (HFpEF) heart failure with preserved ejection fraction (HCC) EF normal by echocardiogram in March 2021.  I will obtain a follow-up echocardiogram as noted.  Volume status appears to be stable on exam.  She is NYHA III.  Proceed with right and left heart catheterization as noted.  Continue current dose of furosemide.  Based upon results of right heart catheterization, we could consider spironolactone, SGLT2 inhibitor.  Stenosis of left subclavian artery (HCC) Repeat blood pressures by me: Right 154/70; Left 150/68.  She does not really have classic steal symptoms.  She has some discomfort in her left arm when she holds her grandson.  At this point, I do not think she has significant left subclavian stenosis.  We can certainly revisit this at follow-up.  Essential hypertension Blood pressure is well controlled at home.  She typically has higher readings in the office.  I do think her blood pressure can tolerate the addition of amlodipine for antianginal therapy.  Continue irbesartan 75 mg daily.  Hyperlipidemia LDL goal <70 She has difficulty tolerating statins.  She has a lot of joint pains related to simvastatin.  She could not tolerate rosuvastatin or atorvastatin.  I have asked her to stop simvastatin.  We can arrange referral to the [facility] lipid clinic for consideration of PCSK9 inhibitor at follow-up.  Valvular heart disease s/p AVR in 2012 Echocardiogram in March 2021 demonstrated normally functioning AVR and mild to moderate MR.  Obtain follow-up echocardiogram as noted.  Continue SBE prophylaxis.  Shortness of breath She has a significant history of smoking.  She notes a lot of shortness of breath.  She recently had an illness that sounds like a COPD exacerbation.  She does admit to wheezing at times.   We will proceed with right and left heart catheterization as noted to rule out ischemia as well as to assess filling pressures and pulmonary pressures.  If she has a fairly unremarkable work-up, she will need referral to pulmonology for further evaluation of her symptoms.      Shared Decision Making/Informed Consent The risks [stroke (1 in 1000), death (1 in 1000), kidney failure [usually temporary] (1 in 500), bleeding (1 in 200), allergic reaction [possibly serious] (1 in 200)], benefits (diagnostic support and management of coronary artery disease) and alternatives of a cardiac catheterization were discussed in detail with Ms. Heiden and she is willing to proceed.   Dispo:  Return in about 5 weeks (around 04/18/2021) for Post Cath follow up with Richardson Dopp, PA-C..   Medication Adjustments/Labs and Tests Ordered: Current medicines are reviewed at length with the patient today.  Concerns regarding medicines are outlined above.  Tests Ordered: Orders Placed This Encounter  Procedures   Basic Metabolic Panel (BMET)   CBC   EKG 12-Lead   ECHOCARDIOGRAM COMPLETE   Medication Changes: Meds ordered this encounter  Medications   amLODipine (NORVASC) 2.5 MG tablet    Sig: Take 1 tablet (2.5 mg total) by mouth daily.    Dispense:  30 tablet    Refill:  11   nitroGLYCERIN (NITROSTAT) 0.4 MG SL tablet    Sig: Place 1 tablet (0.4 mg total) under the tongue every 5 (  five) minutes as needed for chest pain.    Dispense:  25 tablet    Refill:  3   potassium chloride (KLOR-CON) 10 MEQ tablet    Sig: Take 1 tablet (10 mEq total) by mouth as needed. Take with lasix only.    Dispense:  90 tablet    Refill:  3   Signed, Richardson Dopp, PA-C  03/14/2021 10:46 AM    [facility] [place], [place], [place]  [address] Phone: [phone]; Fax: [phone]

## 2021-03-13 NOTE — H&P (View-Only) (Signed)
Cardiology Office Note:    Date:  03/14/2021   ID:  Heather Crawford, Heather Crawford 1945-09-20, MRN [id number]  PCP:  Janith Lima, MD  [facility] Providers Cardiologist:  Sherren Mocha, MD Cardiology APP:  Liliane Shi, PA-C  Electrophysiologist:  Will Meredith Leeds, MD    Referring MD: Janith Lima, MD   Chief Complaint:  Shortness of Breath    Patient Profile: Valvular heart disease, Rheumatic Fever as a child Aortic stenosis, s/p bioprosthetic AVR in 2012 Echo 3/21: EF 84-70, Gr 2 DD, normally functioning AVR Mitral regurgitation Echo 3/21: Mild-moderate MR Coronary artery disease  S/p CABG x 1 in 2012 (RIMA-RCA) Myoview 3/21: No ischemia, EF 68; low risk Chest pain improved on nitrates  (HFpEF) heart failure with preserved ejection fraction  Echo 3/21: EF 29-70, GRII DD Carotid artery disease [place] 10/22: Bilateral ICA 1-39 Left subclavian stenosis LBBB Symptomatic bradycardia S/p Pacemaker in 12/21 Vertigo 2/2 labyrinthitis  GERD Impaired glucose tolerance  Prior CV Studies: Carotid US 11/15/2020 Bilateral ICA 1-39; left subclavian stenosis  Myoview 05/03/2019 EF 68, small fixed apical defect, no ischemia, low risk   Echocardiogram 05/03/2019 EF 65-70, no RWMA, GRII DD, normal RVSF, RVSP 28.8, mild-moderate MR, s/p AVR -normally functioning, no PVL   Echocardiogram 10/14/2016 Mild LVH, EF 60-65, Normally functioning bioprosthetic aortic valve replacement (mean 14, peak 26), mild MR, moderate LAE   Cardiac catheterization 04/03/2010 EF 55 RCA ostial 40-50, distal 90-95 LM normal LCx normal LAD mid 30 >> CABG  History of Present Illness:   Heather Crawford is a 76 y.o. female with the above problem list.  She was last seen on 6/22.  She had been started on long-acting nitrates for exertional jaw pain.  Her symptoms were improved.  She returns for follow-up.    She is here alone.  She notes that she recently went to urgent care in [facility] for worsening  shortness of breath.  She has 2 daughters that are nurses and, upon their evaluation, her O2 sats were 88%.  She was placed on prednisone, azithromycin and an inhaler.  Her symptoms improved somewhat.  However, she continues to have shortness of breath with just minimal exertion.  She also notes that her throat discomfort with exertion has returned.  She has not had syncope.  She sleeps on an incline chronically.  She has not had significant lower extremity edema.    Past Medical History:  Diagnosis Date   Allergic rhinitis, cause unspecified    Aortic stenosis    a.  echo 1/12 w/mildly dilated LV, EF 45-50% w/paradoxical septal motion consistent w/ LBBB, moderate aortic stenosis w/mean gradient 27 mmHg, trivial AI, mild to moderate MR w/calcified mitral valve;   b. s/p AVR with 21 mm Magna Ease valve   Aortic valve replaced 2012   Arthritis    joint pain   CAD (coronary artery disease)    a. s/p CABG 4/12: RIMA-RCA   CAD, multiple vessel    sees Dr Aundra Dubin every 3-4 months   Cancer [facility])    skin sarcomas removed   Carotid artery disease ([place])    Carotid US 10/21: Bilateral ICA 1-39; left subclavian stenosis   CHF (congestive heart failure) (HCC)    chronic diastolic CHF   Chronic back pain    H/O BACK SURGERY   Coronary artery disease    adenosine myoview 2/12 w/EF 56%, ISCHEMIA W/SCAR IN THE MID TO APICAL ANTERIO WALL, SEPTAL WALL, AND APEX. [place] 3/12 W/EF  55%, 40% OSTIAL RCA, 60% MID RCA, 95% DISTAL RC A, INTERVENTION  ATTEMPTED BUT UNABLE TO ADEQUADATELY SEAT  CATHETER.   Diverticulitis of colon (without mention of hemorrhage)(562.11)    Diverticulosis    Esophageal reflux    Glucose intolerance (impaired glucose tolerance)    A1C 6.3 (4/12)   H/O hiatal hernia    H/O vertigo    with fluid  in ears   Heart murmur    Hemorrhoids    Hemorrhoids    History of rheumatic fever    Hx of hysterectomy    Hx: UTI (urinary tract infection)    Hyperlipidemia    LBBB (left bundle branch  block)    Myocardial infarction ([name]) 2002   Pneumonia    PONV (postoperative nausea and vomiting)    PPD positive    in the past   Snoring    Squamous cell carcinoma of skin    nose   Stenosis of left subclavian artery ([place])    Carotid US 10/22: Bilateral ICA 1-39; L subclavian stenosis   Urinary frequency    Current Medications: Current Meds  Medication Sig   acetaminophen (TYLENOL) 325 MG tablet Take 1-2 tablets (325-650 mg total) by mouth every 4 (four) hours as needed for mild pain.   albuterol (VENTOLIN HFA) 108 (90 Base) MCG/ACT inhaler Inhale 2 puffs into the lungs 2 (two) times daily.   amLODipine (NORVASC) 2.5 MG tablet Take 1 tablet (2.5 mg total) by mouth daily.   aspirin EC 81 MG tablet Take 1 tablet (81 mg total) by mouth daily. Swallow whole.   Cholecalciferol 2000 units TABS Take 1 tablet (2,000 Units total) by mouth daily.   fluticasone (FLONASE) 50 MCG/ACT nasal spray USE 1 SPRAY IN EACH NOSTRIL EVERY DAY   furosemide (LASIX) 20 MG tablet TAKE 1 TABLET DAILY AS NEEDED FOR EDEMA OR WEIGHT GAIN. TAKE POTASSIUM 10MEQ WHEN YOU TAKE LASIX   irbesartan (AVAPRO) 150 MG tablet Take 0.5 tablets (75 mg total) by mouth in the morning and at bedtime.   isosorbide mononitrate (IMDUR) 30 MG 24 hr tablet TAKE 1 TABLET EVERY DAY   meclizine (ANTIVERT) 12.5 MG tablet Take 1 tablet (12.5 mg total) by mouth 3 (three) times daily as needed for dizziness.   nitroGLYCERIN (NITROSTAT) 0.4 MG SL tablet Place 1 tablet (0.4 mg total) under the tongue every 5 (five) minutes as needed for chest pain.   Omega-3 Fatty Acids (FISH OIL) 1000 MG CAPS Take 1,000 mg by mouth daily.   omeprazole (PRILOSEC) 40 MG capsule TAKE 1 CAPSULE (40 MG TOTAL) BY MOUTH DAILY.   potassium chloride (KLOR-CON) 10 MEQ tablet Take 1 tablet (10 mEq total) by mouth as needed. Take with lasix only.   Tirbanibulin (KLISYRI) 1 % OINT Apply as directed   [DISCONTINUED] simvastatin (ZOCOR) 40 MG tablet Patient taking 1/2  tablet (20 mg Total) by mouth at bedtime    Allergies:   Crestor [rosuvastatin], Lipitor [atorvastatin], Sulfamethoxazole-trimethoprim, Tape, Amoxicillin, Neomycin, Neosporin [neomycin-bacitracin zn-polymyx], and Polysporin [bacitracin-polymyxin b]   Social History   Tobacco Use   Smoking status: Former    Packs/day: 1.00    Years: 40.00    Pack years: 40.00    Types: Cigarettes    Quit date: 02/03/2000    Years since quitting: 21.1   Smokeless tobacco: Former    Types: Chew   Tobacco comments:    discussed LDCT   Vaping Use   Vaping Use: Never used  Substance Use Topics  Alcohol use: No    Alcohol/week: 0.0 standard drinks   Drug use: No    Family Hx: The patient's family history includes Breast cancer in her maternal aunt, mother, paternal grandmother, and sister; CAD in her father; Colon cancer in her maternal aunt and paternal uncle; Diabetes type II in her mother; Heart attack in her father; Heart disease in her mother; Hypertension in her mother; Hypothyroidism in her mother; Pancreatic cancer in an other family member. There is no history of Stomach cancer, Throat cancer, or Esophageal cancer.  Review of Systems  Constitutional: Negative for fever.  Respiratory:  Positive for cough.   Skin:  Negative for rash.  Gastrointestinal:  Negative for hematochezia, melena and vomiting.  Genitourinary:  Negative for hematuria.  Neurological:  Positive for dizziness (with bending over).    EKGs/Labs/Other Test Reviewed:    EKG:  EKG is  ordered today.  The ekg ordered today demonstrates NSR, HR 89, normal axis, left bundle branch block, no change from prior x-rays  Recent Labs: 06/24/2020: ALT 12; BUN 23; Creatinine, Ser 0.85; Hemoglobin 12.1; Platelets 227.0; Potassium 4.7; Sodium 140; TSH 3.78 07/03/2020: NT-Pro BNP 364   Recent Lipid Panel Recent Labs    06/24/20 1346  CHOL 140  TRIG 201.0*  HDL 42.00  VLDL 40.2*  LDLDIRECT 76.0     Risk Assessment/Calculations:          Physical Exam:    VS:  BP 136/60 (BP Location: Right Arm, Patient Position: Sitting, Cuff Size: Large)    Pulse 96    Ht 5\' 3"  (1.6 m)    Wt 162 lb 3.2 oz (73.6 kg)    SpO2 96%    BMI 28.73 kg/m     Wt Readings from Last 3 Encounters:  03/14/21 162 lb 3.2 oz (73.6 kg)  07/03/20 162 lb 12.8 oz (73.8 kg)  06/24/20 163 lb (73.9 kg)    Constitutional:      Appearance: Healthy appearance. Not in distress.  Neck:     Vascular: No JVR. JVD normal.  Pulmonary:     Effort: Pulmonary effort is normal.     Breath sounds: No wheezing. No rales.  Cardiovascular:     Normal rate. Regular rhythm. Normal S1. Normal S2.      Murmurs: There is a grade 2/6 systolic murmur at the URSB.  Edema:    Peripheral edema absent.  Abdominal:     Palpations: Abdomen is soft.  Skin:    General: Skin is warm and dry.  Neurological:     Mental Status: Alert and oriented to person, place and time.     Cranial Nerves: Cranial nerves are intact.        ASSESSMENT & PLAN:   CAD (coronary artery disease) S/p CABG in 2012 (RIMA-RCA).  Myoview in March 2021 was low risk.  She did have improved symptoms with long-acting nitrates.  However, she has had recurrent exertional throat discomfort.  She also has fairly chronic shortness of breath with exertion.  This has gotten worse recently.  She also had what sounds like a COPD exacerbation several weeks ago.  Given the recurrence of her anginal equivalent and worsening shortness of breath, I have recommended that we proceed with right and left heart catheterization.  I discussed this with Dr. Ali Lowe (attending MD) who agreed.  She has to help her daughter for the next couple of weeks and cannot schedule her heart catheterization until the beginning of March.  I will also obtain  a follow-up echocardiogram.  If her cardiac work-up is fairly unremarkable, she will need referral to pulmonology for further evaluation of her dyspnea. Continue aspirin 81 mg daily, isosorbide  mononitrate 30 mg daily She has difficulty tolerating statins Start amlodipine 2.5 mg daily, NTG as needed Schedule right and left heart catheterization Follow-up post catheterization  (HFpEF) heart failure with preserved ejection fraction (HCC) EF normal by echocardiogram in March 2021.  I will obtain a follow-up echocardiogram as noted.  Volume status appears to be stable on exam.  She is NYHA III.  Proceed with right and left heart catheterization as noted.  Continue current dose of furosemide.  Based upon results of right heart catheterization, we could consider spironolactone, SGLT2 inhibitor.  Stenosis of left subclavian artery (HCC) Repeat blood pressures by me: Right 154/70; Left 150/68.  She does not really have classic steal symptoms.  She has some discomfort in her left arm when she holds her grandson.  At this point, I do not think she has significant left subclavian stenosis.  We can certainly revisit this at follow-up.  Essential hypertension Blood pressure is well controlled at home.  She typically has higher readings in the office.  I do think her blood pressure can tolerate the addition of amlodipine for antianginal therapy.  Continue irbesartan 75 mg daily.  Hyperlipidemia LDL goal <70 She has difficulty tolerating statins.  She has a lot of joint pains related to simvastatin.  She could not tolerate rosuvastatin or atorvastatin.  I have asked her to stop simvastatin.  We can arrange referral to the Pharm.D. lipid clinic for consideration of PCSK9 inhibitor at follow-up.  Valvular heart disease s/p AVR in 2012 Echocardiogram in March 2021 demonstrated normally functioning AVR and mild to moderate MR.  Obtain follow-up echocardiogram as noted.  Continue SBE prophylaxis.  Shortness of breath She has a significant history of smoking.  She notes a lot of shortness of breath.  She recently had an illness that sounds like a COPD exacerbation.  She does admit to wheezing at times.   We will proceed with right and left heart catheterization as noted to rule out ischemia as well as to assess filling pressures and pulmonary pressures.  If she has a fairly unremarkable work-up, she will need referral to pulmonology for further evaluation of her symptoms.      Shared Decision Making/Informed Consent The risks [stroke (1 in 1000), death (1 in 1000), kidney failure [usually temporary] (1 in 500), bleeding (1 in 200), allergic reaction [possibly serious] (1 in 200)], benefits (diagnostic support and management of coronary artery disease) and alternatives of a cardiac catheterization were discussed in detail with Heather Crawford and she is willing to proceed.   Dispo:  Return in about 5 weeks (around 04/18/2021) for Post Cath follow up with Richardson Dopp, PA-C..   Medication Adjustments/Labs and Tests Ordered: Current medicines are reviewed at length with the patient today.  Concerns regarding medicines are outlined above.  Tests Ordered: Orders Placed This Encounter  Procedures   Basic Metabolic Panel (BMET)   CBC   EKG 12-Lead   ECHOCARDIOGRAM COMPLETE   Medication Changes: Meds ordered this encounter  Medications   amLODipine (NORVASC) 2.5 MG tablet    Sig: Take 1 tablet (2.5 mg total) by mouth daily.    Dispense:  30 tablet    Refill:  11   nitroGLYCERIN (NITROSTAT) 0.4 MG SL tablet    Sig: Place 1 tablet (0.4 mg total) under the tongue every 5 (  five) minutes as needed for chest pain.    Dispense:  25 tablet    Refill:  3   potassium chloride (KLOR-CON) 10 MEQ tablet    Sig: Take 1 tablet (10 mEq total) by mouth as needed. Take with lasix only.    Dispense:  90 tablet    Refill:  3   Signed, Richardson Dopp, PA-C  03/14/2021 10:46 AM    [facility] [address] [place], [place], [place]  [address] Phone: [phone]; Fax: [phone]

## 2021-03-14 ENCOUNTER — Ambulatory Visit: Payer: Medicare HMO | Admitting: Physician Assistant

## 2021-03-14 ENCOUNTER — Other Ambulatory Visit: Payer: Self-pay

## 2021-03-14 ENCOUNTER — Encounter: Payer: Self-pay | Admitting: Physician Assistant

## 2021-03-14 VITALS — BP 136/60 | HR 96 | Ht 63.0 in | Wt 162.2 lb

## 2021-03-14 DIAGNOSIS — E785 Hyperlipidemia, unspecified: Secondary | ICD-10-CM

## 2021-03-14 DIAGNOSIS — I38 Endocarditis, valve unspecified: Secondary | ICD-10-CM | POA: Diagnosis not present

## 2021-03-14 DIAGNOSIS — I10 Essential (primary) hypertension: Secondary | ICD-10-CM

## 2021-03-14 DIAGNOSIS — R0602 Shortness of breath: Secondary | ICD-10-CM

## 2021-03-14 DIAGNOSIS — I25119 Atherosclerotic heart disease of native coronary artery with unspecified angina pectoris: Secondary | ICD-10-CM | POA: Diagnosis not present

## 2021-03-14 DIAGNOSIS — I5020 Unspecified systolic (congestive) heart failure: Secondary | ICD-10-CM

## 2021-03-14 DIAGNOSIS — I2510 Atherosclerotic heart disease of native coronary artery without angina pectoris: Secondary | ICD-10-CM

## 2021-03-14 DIAGNOSIS — I5032 Chronic diastolic (congestive) heart failure: Secondary | ICD-10-CM | POA: Diagnosis not present

## 2021-03-14 DIAGNOSIS — I771 Stricture of artery: Secondary | ICD-10-CM | POA: Diagnosis not present

## 2021-03-14 MED ORDER — AMLODIPINE BESYLATE 2.5 MG PO TABS: 2.5000 mg | ORAL_TABLET | Freq: Every day | ORAL | 11 refills | Status: DC

## 2021-03-14 MED ORDER — NITROGLYCERIN 0.4 MG SL SUBL: 0.4000 mg | SUBLINGUAL_TABLET | SUBLINGUAL | 3 refills | Status: DC | PRN

## 2021-03-14 MED ORDER — POTASSIUM CHLORIDE ER 10 MEQ PO TBCR: 10.0000 meq | EXTENDED_RELEASE_TABLET | ORAL | 3 refills | Status: DC | PRN

## 2021-03-14 NOTE — Assessment & Plan Note (Signed)
EF normal by echocardiogram in March 2021.  I will obtain a follow-up echocardiogram as noted.  Volume status appears to be stable on exam.  She is NYHA III.  Proceed with right and left heart catheterization as noted.  Continue current dose of furosemide.  Based upon results of right heart catheterization, we could consider spironolactone, SGLT2 inhibitor.

## 2021-03-14 NOTE — Patient Instructions (Signed)
Medication Instructions:   START  Nitroglycerin ( 0.4 mg) Sublingual.  If a single episode of chest pain is not relieved by one tablet, the patient will try another within 5 minutes; and if this doesn't relieve the pain, the patient will try another within 5 minutes up to X 3. If this doesn't relieve the pain the patient is instructed to call 911 for transportation to an emergency department.  DISCONTINUE Simvastatin  START Amlodipine one ( 1 ) tablet by mouth ( 2.5 mg ) daily.   *If you need a refill on your cardiac medications before your next appointment, please call your pharmacy*   Lab Work:  Your physician recommends that you return for lab work in [place] on March 1st. You can come in on the day of your appointment anytime between 8:00-4:30. BMET/CBC.     If you have labs (blood work) drawn today and your tests are completely normal, you will receive your results only by: [place] (if you have MyChart) OR A paper copy in the mail If you have any lab test that is abnormal or we need to change your treatment, we will call you to review the results.   Testing/Procedures:  Your physician has requested that you have an echocardiogram. Echocardiography is a painless test that uses sound waves to create images of your heart. It provides your doctor with information about the size and shape of your heart and how well your hearts chambers and valves are working. This procedure takes approximately one hour. There are no restrictions for this procedure. IN [place] ON Tuesday, February 14 @ 1:45 PM.    [place] OFFICE [address], [address] [place] [address] Dept: [phone] Loc: [place]  03/14/2021  You are scheduled for a Cardiac Catheterization on Monday, March 6 with Dr. Harrell Gave End.  1. Please arrive at the [facility] (Main Entrance A) at [facility]: [place], [place] [address] at 7:00 AM (This time is two hours before your procedure to ensure your preparation). Free valet parking service is available.   Special note: Every effort is made to have your procedure done on time. Please understand that emergencies sometimes delay scheduled procedures.  2. Diet: Do not eat solid foods after midnight.  The patient may have clear liquids until 5am upon the day of the procedure.  4. Medication instructions in preparation for your procedure:   Contrast Allergy: No  HOLD Lasix the am of test.       On the morning of your procedure, take your Aspirin and any morning medicines NOT listed above.  You may use sips of water.  5. Plan for one night stay--bring personal belongings. 6. Bring a current list of your medications and current insurance cards. 7. You MUST have a responsible person to drive you home. 8. Someone MUST be with you the first 24 hours after you arrive home or your discharge will be delayed. 9. Please wear clothes that are easy to get on and off and wear slip-on shoes.  Thank you for allowing [place] to care for you!   -- [facility]     Follow-Up: At [facility], you and your health needs are our priority.  As part of our continuing mission to provide you with exceptional heart care, we have created designated Provider Care Teams.  These Care Teams include your primary Cardiologist (  physician) and Advanced Practice Providers (APPs -  Physician Assistants and Nurse Practitioners) who all work together to provide you with the care you need, when you need it.  We recommend signing up for the patient portal called "MyChart".  Sign up information is provided on this After Visit Summary.  MyChart is used to connect with patients for Virtual Visits (Telemedicine).  Patients are able to view lab/test results, encounter notes, upcoming appointments, etc.  Non-urgent messages can be  sent to your provider as well.   To learn more about what you can do with MyChart, go to [email or web address].    Your next appointment:   5 week(s)  The format for your next appointment:   In Person  Provider:   Richardson Dopp, PA-C

## 2021-03-14 NOTE — Assessment & Plan Note (Signed)
S/p CABG in 2012 [facility]).  Myoview in March 2021 was low risk.  She did have improved symptoms with long-acting nitrates.  However, she has had recurrent exertional throat discomfort.  She also has fairly chronic shortness of breath with exertion.  This has gotten worse recently.  She also had what sounds like a COPD exacerbation several weeks ago.  Given the recurrence of her anginal equivalent and worsening shortness of breath, I have recommended that we proceed with right and left heart catheterization.  I discussed this with Dr. Ali Lowe (attending MD) who agreed.  She has to help her daughter for the next couple of weeks and cannot schedule her heart catheterization until the beginning of March.  I will also obtain a follow-up echocardiogram.  If her cardiac work-up is fairly unremarkable, she will need referral to pulmonology for further evaluation of her dyspnea.  Continue aspirin 81 mg daily, isosorbide mononitrate 30 mg daily  She has difficulty tolerating statins  Start amlodipine 2.5 mg daily, NTG as needed  Schedule right and left heart catheterization  Follow-up post catheterization

## 2021-03-14 NOTE — Assessment & Plan Note (Signed)
Repeat blood pressures by me: Right 154/70; Left 150/68.  She does not really have classic steal symptoms.  She has some discomfort in her left arm when she holds her grandson.  At this point, I do not think she has significant left subclavian stenosis.  We can certainly revisit this at follow-up.

## 2021-03-14 NOTE — Assessment & Plan Note (Signed)
Blood pressure is well controlled at home.  She typically has higher readings in the office.  I do think her blood pressure can tolerate the addition of amlodipine for antianginal therapy.  Continue irbesartan 75 mg daily.

## 2021-03-14 NOTE — Assessment & Plan Note (Signed)
Echocardiogram in March 2021 demonstrated normally functioning AVR and mild to moderate MR.  Obtain follow-up echocardiogram as noted.  Continue SBE prophylaxis.

## 2021-03-14 NOTE — Assessment & Plan Note (Signed)
She has a significant history of smoking.  She notes a lot of shortness of breath.  She recently had an illness that sounds like a COPD exacerbation.  She does admit to wheezing at times.  We will proceed with right and left heart catheterization as noted to rule out ischemia as well as to assess filling pressures and pulmonary pressures.  If she has a fairly unremarkable work-up, she will need referral to pulmonology for further evaluation of her symptoms.

## 2021-03-14 NOTE — Assessment & Plan Note (Signed)
She has difficulty tolerating statins.  She has a lot of joint pains related to simvastatin.  She could not tolerate rosuvastatin or atorvastatin.  I have asked her to stop simvastatin.  We can arrange referral to the Pharm.D. lipid clinic for consideration of PCSK9 inhibitor at follow-up.

## 2021-03-18 ENCOUNTER — Encounter: Payer: Self-pay | Admitting: Physician Assistant

## 2021-03-18 ENCOUNTER — Ambulatory Visit (INDEPENDENT_AMBULATORY_CARE_PROVIDER_SITE_OTHER): Payer: Medicare HMO

## 2021-03-18 ENCOUNTER — Other Ambulatory Visit: Payer: Self-pay

## 2021-03-18 DIAGNOSIS — I10 Essential (primary) hypertension: Secondary | ICD-10-CM

## 2021-03-18 DIAGNOSIS — I5020 Unspecified systolic (congestive) heart failure: Secondary | ICD-10-CM | POA: Diagnosis not present

## 2021-03-18 DIAGNOSIS — R0602 Shortness of breath: Secondary | ICD-10-CM

## 2021-03-18 DIAGNOSIS — E785 Hyperlipidemia, unspecified: Secondary | ICD-10-CM

## 2021-03-18 DIAGNOSIS — I771 Stricture of artery: Secondary | ICD-10-CM | POA: Diagnosis not present

## 2021-03-18 DIAGNOSIS — I38 Endocarditis, valve unspecified: Secondary | ICD-10-CM | POA: Diagnosis not present

## 2021-03-18 DIAGNOSIS — I25119 Atherosclerotic heart disease of native coronary artery with unspecified angina pectoris: Secondary | ICD-10-CM

## 2021-03-18 DIAGNOSIS — I5032 Chronic diastolic (congestive) heart failure: Secondary | ICD-10-CM | POA: Diagnosis not present

## 2021-03-18 LAB — ECHOCARDIOGRAM COMPLETE
AR max vel: 1.2 cm2
AV Area VTI: 1.26 cm2
AV Area mean vel: 1.11 cm2
AV Mean grad: 22 mmHg
AV Peak grad: 36.5 mmHg
Ao pk vel: 3.02 m/s
Area-P 1/2: 2.45 cm2
S' Lateral: 2.3 cm

## 2021-04-02 DIAGNOSIS — R0602 Shortness of breath: Secondary | ICD-10-CM | POA: Diagnosis not present

## 2021-04-02 DIAGNOSIS — E785 Hyperlipidemia, unspecified: Secondary | ICD-10-CM | POA: Diagnosis not present

## 2021-04-02 DIAGNOSIS — I771 Stricture of artery: Secondary | ICD-10-CM | POA: Diagnosis not present

## 2021-04-02 DIAGNOSIS — I5032 Chronic diastolic (congestive) heart failure: Secondary | ICD-10-CM | POA: Diagnosis not present

## 2021-04-02 DIAGNOSIS — I38 Endocarditis, valve unspecified: Secondary | ICD-10-CM | POA: Diagnosis not present

## 2021-04-02 DIAGNOSIS — I5020 Unspecified systolic (congestive) heart failure: Secondary | ICD-10-CM | POA: Diagnosis not present

## 2021-04-02 DIAGNOSIS — I25119 Atherosclerotic heart disease of native coronary artery with unspecified angina pectoris: Secondary | ICD-10-CM | POA: Diagnosis not present

## 2021-04-02 DIAGNOSIS — I10 Essential (primary) hypertension: Secondary | ICD-10-CM | POA: Diagnosis not present

## 2021-04-03 ENCOUNTER — Telehealth: Payer: Self-pay | Admitting: *Deleted

## 2021-04-03 LAB — CBC
Hematocrit: 34.1 % (ref 34.0–46.6)
Hemoglobin: 11.6 g/dL (ref 11.1–15.9)
MCH: 29.4 pg (ref 26.6–33.0)
MCHC: 34 g/dL (ref 31.5–35.7)
MCV: 87 fL (ref 79–97)
Platelets: 230 10*3/uL (ref 150–450)
RBC: 3.94 x10E6/uL (ref 3.77–5.28)
RDW: 13.3 % (ref 11.7–15.4)
WBC: 5.7 10*3/uL (ref 3.4–10.8)

## 2021-04-03 LAB — BASIC METABOLIC PANEL
BUN/Creatinine Ratio: 26 (ref 12–28)
BUN: 21 mg/dL (ref 8–27)
CO2: 18 mmol/L — ABNORMAL LOW (ref 20–29)
Calcium: 9.1 mg/dL (ref 8.7–10.3)
Chloride: 102 mmol/L (ref 96–106)
Creatinine, Ser: 0.81 mg/dL (ref 0.57–1.00)
Glucose: 131 mg/dL — ABNORMAL HIGH (ref 70–99)
Potassium: 4.8 mmol/L (ref 3.5–5.2)
Sodium: 136 mmol/L (ref 134–144)
eGFR: 75 mL/min/{1.73_m2} (ref >59)

## 2021-04-03 NOTE — Telephone Encounter (Signed)
Cardiac catheterization scheduled at [facility] for: Monday April 04, 2021 9 AM ?[facility] Main Entrance A [facility]) at: 7 AM ? ? ?Diet-no solid food after midnight prior to cath, clear liquids until 5 AM day of procedure. ? ?Medication instructions for procedure: ?-Hold: ? Lasix/KCl-AM of procedure ?-Except hold medications usual morning medications can be taken pre-cath with sips of water including aspirin 81 mg. ?   ?Must have responsible adult to drive home post procedure and be with patient first 24 hours after arriving home. ? ?[facility] does allow one visitor to wait in the waiting room during the time you are there. ? ? ?Patient reports does not currently have any new symptoms concerning for COVID-19 and no household members with COVID-19 like illness.  ? ? ? ?Reviewed procedure instructions with patient.  ?   ? ? ? ? ?

## 2021-04-07 ENCOUNTER — Other Ambulatory Visit: Payer: Self-pay

## 2021-04-07 ENCOUNTER — Encounter (HOSPITAL_COMMUNITY)
Admission: RE | Disposition: A | Discharge status: Home / Self Care | Payer: Medicare HMO | Source: Home / Self Care | Attending: Internal Medicine

## 2021-04-07 ENCOUNTER — Ambulatory Visit (HOSPITAL_COMMUNITY)
Admission: RE | Admit: 2021-04-07 | Discharge: 2021-04-07 | Disposition: A | Discharge status: Home / Self Care | Payer: Medicare HMO | Attending: Internal Medicine | Admitting: Internal Medicine

## 2021-04-07 DIAGNOSIS — Z953 Presence of xenogenic heart valve: Secondary | ICD-10-CM | POA: Diagnosis not present

## 2021-04-07 DIAGNOSIS — Z79899 Other long term (current) drug therapy: Secondary | ICD-10-CM | POA: Diagnosis not present

## 2021-04-07 DIAGNOSIS — R0602 Shortness of breath: Secondary | ICD-10-CM | POA: Diagnosis not present

## 2021-04-07 DIAGNOSIS — Z7982 Long term (current) use of aspirin: Secondary | ICD-10-CM | POA: Diagnosis not present

## 2021-04-07 DIAGNOSIS — I080 Rheumatic disorders of both mitral and aortic valves: Secondary | ICD-10-CM | POA: Insufficient documentation

## 2021-04-07 DIAGNOSIS — I5032 Chronic diastolic (congestive) heart failure: Secondary | ICD-10-CM | POA: Insufficient documentation

## 2021-04-07 DIAGNOSIS — I25119 Atherosclerotic heart disease of native coronary artery with unspecified angina pectoris: Secondary | ICD-10-CM

## 2021-04-07 DIAGNOSIS — R7302 Impaired glucose tolerance (oral): Secondary | ICD-10-CM | POA: Insufficient documentation

## 2021-04-07 DIAGNOSIS — I2582 Chronic total occlusion of coronary artery: Secondary | ICD-10-CM | POA: Diagnosis not present

## 2021-04-07 DIAGNOSIS — Z951 Presence of aortocoronary bypass graft: Secondary | ICD-10-CM | POA: Insufficient documentation

## 2021-04-07 DIAGNOSIS — K219 Gastro-esophageal reflux disease without esophagitis: Secondary | ICD-10-CM | POA: Insufficient documentation

## 2021-04-07 DIAGNOSIS — Z87891 Personal history of nicotine dependence: Secondary | ICD-10-CM | POA: Diagnosis not present

## 2021-04-07 DIAGNOSIS — I5020 Unspecified systolic (congestive) heart failure: Secondary | ICD-10-CM

## 2021-04-07 DIAGNOSIS — I110 Hypertensive heart disease with heart failure: Secondary | ICD-10-CM | POA: Insufficient documentation

## 2021-04-07 DIAGNOSIS — E785 Hyperlipidemia, unspecified: Secondary | ICD-10-CM | POA: Diagnosis not present

## 2021-04-07 DIAGNOSIS — I25118 Atherosclerotic heart disease of native coronary artery with other forms of angina pectoris: Secondary | ICD-10-CM | POA: Diagnosis not present

## 2021-04-07 DIAGNOSIS — I447 Left bundle-branch block, unspecified: Secondary | ICD-10-CM | POA: Diagnosis not present

## 2021-04-07 DIAGNOSIS — J449 Chronic obstructive pulmonary disease, unspecified: Secondary | ICD-10-CM | POA: Diagnosis not present

## 2021-04-07 DIAGNOSIS — I252 Old myocardial infarction: Secondary | ICD-10-CM | POA: Diagnosis not present

## 2021-04-07 HISTORY — PX: RIGHT/LEFT HEART CATH AND CORONARY/GRAFT ANGIOGRAPHY: CATH118267

## 2021-04-07 LAB — POCT I-STAT EG7
Acid-base deficit: 2 mmol/L (ref 0.0–2.0)
Bicarbonate: 23.8 mmol/L (ref 20.0–28.0)
Calcium, Ion: 1.16 mmol/L (ref 1.15–1.40)
HCT: 31 % — ABNORMAL LOW (ref 36.0–46.0)
Hemoglobin: 10.5 g/dL — ABNORMAL LOW (ref 12.0–15.0)
O2 Saturation: 73 %
Potassium: 3.9 mmol/L (ref 3.5–5.1)
Sodium: 141 mmol/L (ref 135–145)
TCO2: 25 mmol/L (ref 22–32)
pCO2, Ven: 45.5 mmHg (ref 44–60)
pH, Ven: 7.326 (ref 7.25–7.43)
pO2, Ven: 41 mmHg (ref 32–45)

## 2021-04-07 LAB — POCT I-STAT 7, (LYTES, BLD GAS, ICA,H+H)
Acid-base deficit: 2 mmol/L (ref 0.0–2.0)
Bicarbonate: 23.5 mmol/L (ref 20.0–28.0)
Calcium, Ion: 1.22 mmol/L (ref 1.15–1.40)
HCT: 32 % — ABNORMAL LOW (ref 36.0–46.0)
Hemoglobin: 10.9 g/dL — ABNORMAL LOW (ref 12.0–15.0)
O2 Saturation: 100 %
Potassium: 4.1 mmol/L (ref 3.5–5.1)
Sodium: 140 mmol/L (ref 135–145)
TCO2: 25 mmol/L (ref 22–32)
pCO2 arterial: 41.9 mmHg (ref 32–48)
pH, Arterial: 7.356 (ref 7.35–7.45)
pO2, Arterial: 206 mmHg — ABNORMAL HIGH (ref 83–108)

## 2021-04-07 SURGERY — RIGHT/LEFT HEART CATH AND CORONARY/GRAFT ANGIOGRAPHY
Anesthesia: LOCAL

## 2021-04-07 MED ORDER — HEPARIN SODIUM (PORCINE) 1000 UNIT/ML IJ SOLN
INTRAMUSCULAR | Status: DC | PRN
  Administered 2021-04-07: 3500 [IU] via INTRAVENOUS

## 2021-04-07 MED ORDER — SODIUM CHLORIDE 0.9 % IV SOLN: INTRAVENOUS | Status: DC

## 2021-04-07 MED ORDER — LABETALOL HCL 5 MG/ML IV SOLN: 10.0000 mg | INTRAVENOUS | Status: DC | PRN

## 2021-04-07 MED ORDER — HEPARIN SODIUM (PORCINE) 1000 UNIT/ML IJ SOLN
INTRAMUSCULAR | Status: AC
  Filled 2021-04-07: qty 10

## 2021-04-07 MED ORDER — SODIUM CHLORIDE 0.9 % WEIGHT BASED INFUSION: 1.0000 mL/kg/h | INTRAVENOUS | Status: DC

## 2021-04-07 MED ORDER — HEPARIN (PORCINE) IN NACL 1000-0.9 UT/500ML-% IV SOLN
INTRAVENOUS | Status: DC | PRN
  Administered 2021-04-07 (×2): 500 mL

## 2021-04-07 MED ORDER — FENTANYL CITRATE (PF) 100 MCG/2ML IJ SOLN
INTRAMUSCULAR | Status: AC
  Filled 2021-04-07: qty 2

## 2021-04-07 MED ORDER — ACETAMINOPHEN 325 MG PO TABS: 650.0000 mg | ORAL_TABLET | ORAL | Status: DC | PRN

## 2021-04-07 MED ORDER — IOHEXOL 350 MG/ML SOLN
INTRAVENOUS | Status: DC | PRN
Start: 2021-04-07 — End: 2021-04-07
  Administered 2021-04-07: 65 mL

## 2021-04-07 MED ORDER — HEPARIN (PORCINE) IN NACL 2-0.9 UNITS/ML
INTRAMUSCULAR | Status: DC | PRN
  Administered 2021-04-07: 10 mL via INTRA_ARTERIAL

## 2021-04-07 MED ORDER — LIDOCAINE HCL (PF) 1 % IJ SOLN
INTRAMUSCULAR | Status: DC | PRN
  Administered 2021-04-07 (×2): 2 mL

## 2021-04-07 MED ORDER — SODIUM CHLORIDE 0.9 % WEIGHT BASED INFUSION
3.0000 mL/kg/h | INTRAVENOUS | Status: AC
  Administered 2021-04-07: 3 mL/kg/h via INTRAVENOUS

## 2021-04-07 MED ORDER — HEPARIN (PORCINE) IN NACL 1000-0.9 UT/500ML-% IV SOLN
INTRAVENOUS | Status: AC
  Filled 2021-04-07: qty 1000

## 2021-04-07 MED ORDER — SODIUM CHLORIDE 0.9 % IV SOLN: 250.0000 mL | INTRAVENOUS | Status: DC | PRN

## 2021-04-07 MED ORDER — MIDAZOLAM HCL 2 MG/2ML IJ SOLN
INTRAMUSCULAR | Status: DC | PRN
  Administered 2021-04-07: 1 mg via INTRAVENOUS

## 2021-04-07 MED ORDER — VERAPAMIL HCL 2.5 MG/ML IV SOLN
INTRAVENOUS | Status: AC
  Filled 2021-04-07: qty 2

## 2021-04-07 MED ORDER — ONDANSETRON HCL 4 MG/2ML IJ SOLN: 4.0000 mg | Freq: Four times a day (QID) | INTRAMUSCULAR | Status: DC | PRN

## 2021-04-07 MED ORDER — SODIUM CHLORIDE 0.9% FLUSH: 3.0000 mL | INTRAVENOUS | Status: DC | PRN

## 2021-04-07 MED ORDER — HYDRALAZINE HCL 20 MG/ML IJ SOLN: 10.0000 mg | INTRAMUSCULAR | Status: DC | PRN

## 2021-04-07 MED ORDER — MIDAZOLAM HCL 2 MG/2ML IJ SOLN
INTRAMUSCULAR | Status: AC
  Filled 2021-04-07: qty 2

## 2021-04-07 MED ORDER — LIDOCAINE HCL (PF) 1 % IJ SOLN
INTRAMUSCULAR | Status: AC
  Filled 2021-04-07: qty 30

## 2021-04-07 MED ORDER — ASPIRIN 81 MG PO CHEW: 81.0000 mg | CHEWABLE_TABLET | ORAL | Status: DC

## 2021-04-07 MED ORDER — FENTANYL CITRATE (PF) 100 MCG/2ML IJ SOLN
INTRAMUSCULAR | Status: DC | PRN
  Administered 2021-04-07: 25 ug via INTRAVENOUS

## 2021-04-07 MED ORDER — SODIUM CHLORIDE 0.9% FLUSH: 3.0000 mL | Freq: Two times a day (BID) | INTRAVENOUS | Status: DC

## 2021-04-07 SURGICAL SUPPLY — 17 items
CATH 5FR JL3.5 JR4 ANG PIG MP (CATHETERS) ×1 IMPLANT
CATH BALLN WEDGE 5F 110CM (CATHETERS) ×1 IMPLANT
CATH INFINITI 5 FR IM (CATHETERS) ×1 IMPLANT
CATH INFINITI 5 FR MPA2 (CATHETERS) ×1 IMPLANT
CATH LANGSTON DUAL LUM PIG 6FR (CATHETERS) ×1 IMPLANT
DEVICE RAD COMP TR BAND LRG (VASCULAR PRODUCTS) ×1 IMPLANT
GLIDESHEATH SLEND SS 6F .021 (SHEATH) ×1 IMPLANT
GUIDEWIRE INQWIRE 1.5J.035X260 (WIRE) IMPLANT
INQWIRE 1.5J .035X260CM (WIRE) ×2
KIT HEART LEFT (KITS) ×2 IMPLANT
PACK CARDIAC CATHETERIZATION (CUSTOM PROCEDURE TRAY) ×2 IMPLANT
SHEATH GLIDE SLENDER 4/5FR (SHEATH) ×1 IMPLANT
TRANSDUCER W/STOPCOCK (MISCELLANEOUS) ×3 IMPLANT
TUBING ART PRESS 72  MALE/FEM (TUBING) ×2
TUBING ART PRESS 72 MALE/FEM (TUBING) IMPLANT
TUBING CIL FLEX 10 FLL-RA (TUBING) ×2 IMPLANT
WIRE EMERALD ST .035X260CM (WIRE) ×1 IMPLANT

## 2021-04-07 NOTE — Interval H&P Note (Signed)
History and Physical Interval Note: ? ?04/07/2021 ?8:48 AM ? ?Heather Crawford  has presented today for surgery, with the diagnosis of shortness of breath and coronary artery disease with stable angina.  The various methods of treatment have been discussed with the patient and family. After consideration of risks, benefits and other options for treatment, the patient has consented to  Procedure(s): ?RIGHT/LEFT HEART CATH AND CORONARY/GRAFT ANGIOGRAPHY (N/A) as a surgical intervention.  The patient's history has been reviewed, patient examined, no change in status, stable for surgery.  I have reviewed the patient's chart and labs.  Questions were answered to the patient's satisfaction.   ? ?Cath Lab Visit (complete for each Cath Lab visit) ? ?Clinical Evaluation Leading to the Procedure:  ? ?ACS: No. ? ?Non-ACS:   ? ?Anginal Classification: CCS III ? ?Anti-ischemic medical therapy: Maximal Therapy (2 or more classes of medications) ? ?Non-Invasive Test Results: No non-invasive testing performed ? ?Prior CABG: Previous CABG ? ?Heather Crawford ? ? ?

## 2021-04-08 ENCOUNTER — Encounter (HOSPITAL_COMMUNITY): Payer: Self-pay | Admitting: Internal Medicine

## 2021-04-08 DIAGNOSIS — R300 Dysuria: Secondary | ICD-10-CM | POA: Diagnosis not present

## 2021-04-08 DIAGNOSIS — N3000 Acute cystitis without hematuria: Secondary | ICD-10-CM | POA: Diagnosis not present

## 2021-04-11 DIAGNOSIS — R21 Rash and other nonspecific skin eruption: Secondary | ICD-10-CM | POA: Diagnosis not present

## 2021-04-11 DIAGNOSIS — B029 Zoster without complications: Secondary | ICD-10-CM | POA: Diagnosis not present

## 2021-04-14 ENCOUNTER — Ambulatory Visit (INDEPENDENT_AMBULATORY_CARE_PROVIDER_SITE_OTHER): Payer: Medicare HMO | Admitting: Cardiology

## 2021-04-14 ENCOUNTER — Other Ambulatory Visit: Payer: Self-pay

## 2021-04-14 ENCOUNTER — Encounter: Payer: Self-pay | Admitting: Cardiology

## 2021-04-14 DIAGNOSIS — I442 Atrioventricular block, complete: Secondary | ICD-10-CM | POA: Diagnosis not present

## 2021-04-14 NOTE — Patient Instructions (Signed)
Medication Instructions:  ?NO CHANGES ?*If you need a refill on your cardiac medications before your next appointment, please call your pharmacy* ? ? ?Lab Work: ?NONE ?If you have labs (blood work) drawn today and your tests are completely normal, you will receive your results only by: ?MyChart Message (if you have MyChart) OR ?A paper copy in the mail ?If you have any lab test that is abnormal or we need to change your treatment, we will call you to review the results. ? ? ?Testing/Procedures: ?NONE ? ? ?Follow-Up: ?At [facility], you and your health needs are our priority.  As part of our continuing mission to provide you with exceptional heart care, we have created designated Provider Care Teams.  These Care Teams include your primary Cardiologist (physician) and Advanced Practice Providers (APPs -  Physician Assistants and Nurse Practitioners) who all work together to provide you with the care you need, when you need it. ? ?We recommend signing up for the patient portal called "MyChart".  Sign up information is provided on this After Visit Summary.  MyChart is used to connect with patients for Virtual Visits (Telemedicine).  Patients are able to view lab/test results, encounter notes, upcoming appointments, etc.  Non-urgent messages can be sent to your provider as well.   ?To learn more about what you can do with MyChart, go to [email or web address].   ? ?Your next appointment:   ?1 year(s) ? ?The format for your next appointment:   ?In Person ? ?Provider:   ?DR Curt Bears   ? ? ?Other Instructions ?NONE  ?

## 2021-04-14 NOTE — Progress Notes (Signed)
Electrophysiology Office Note   Date:  04/14/2021   ID:  Heather Crawford 29-Jun-1945, MRN [id number]  PCP:  Heather Lima, MD  Cardiologist:  Heather Crawford Primary Electrophysiologist:  Heather Leth Meredith Leeds, MD    Chief Complaint: Near syncope   History of Present Illness: Heather Crawford is a 76 y.o. female who is being seen today for the evaluation of near syncope at the request of Heather Lima, MD. Presenting today for electrophysiology evaluation.  She has a history significant for aortic stenosis status post AVR, coronary artery disease status post CABG in 2505, diastolic heart failure, left bundle branch block.  She had been having episodes of dizziness and near syncope.  She wore a cardiac monitor that showed multiple daytime pauses.  She is now status post Medtronic dual-chamber pacemaker implanted 01/17/2020.  Today, denies symptoms of palpitations, chest pain, shortness of breath, orthopnea, PND, lower extremity edema, claudication, dizziness, presyncope, syncope, bleeding, or neurologic sequela. The patient is tolerating medications without difficulties.  She currently feels well.  She has no chest pain or shortness of breath.  She had a cardiac catheterization 1 week ago that showed chronic occlusion of the mid RCA and nonobstructive disease in the LAD.  Her RIMA to RCA was widely patent.  She currently feels well and has no complaints at this time.   Past Medical History:  Diagnosis Date   Allergic rhinitis, cause unspecified    Aortic stenosis    a.  echo 1/12 w/mildly dilated LV, EF 45-50% w/paradoxical septal motion consistent w/ LBBB, moderate aortic stenosis w/mean gradient 27 mmHg, trivial AI, mild to moderate MR w/calcified mitral valve;   b. s/p AVR with 21 mm Magna Ease valve   Aortic valve replaced 2012   Arthritis    joint pain   CAD (coronary artery disease)    a. s/p CABG 4/12: RIMA-RCA   CAD, multiple vessel    sees Dr Heather Crawford every 3-4 months    Cancer Heather Crawford)    skin sarcomas removed   Carotid artery disease ([place])    Carotid US 10/21: Bilateral ICA 1-39; left subclavian stenosis   CHF (congestive heart failure) (HCC)    chronic diastolic CHF   Chronic back pain    H/O BACK SURGERY   Coronary artery disease    adenosine myoview 2/12 w/EF 56%, ISCHEMIA W/SCAR IN THE MID TO APICAL ANTERIO WALL, SEPTAL WALL, AND APEX. LHC 3/12 W/EF 55%, 40% OSTIAL RCA, 60% MID RCA, 95% DISTAL RC A, INTERVENTION  ATTEMPTED BUT UNABLE TO ADEQUADATELY SEAT  CATHETER.   Diverticulitis of colon (without mention of hemorrhage)(562.11)    Diverticulosis    Esophageal reflux    Glucose intolerance (impaired glucose tolerance)    A1C 6.3 (4/12)   H/O hiatal hernia    H/O vertigo    with fluid  in ears   Heart murmur    Hemorrhoids    Hemorrhoids    History of rheumatic fever    Hx of hysterectomy    Hx: UTI (urinary tract infection)    Hyperlipidemia    LBBB (left bundle branch block)    Myocardial infarction (Heather Crawford) 2002   Pneumonia    PONV (postoperative nausea and vomiting)    PPD positive    in the past   Snoring    Squamous cell carcinoma of skin    nose   Stenosis of left subclavian artery (Heather Crawford)    Carotid US 10/22: Bilateral ICA 1-39; L subclavian stenosis  Urinary frequency    Valvular heart disease s/p AVR in 2012 02/07/2010   AoV Replacement with Bovine prosthesis ' 12 Echo 2/23: EF 60-65, no RWMA, GR 1 DD, normal RVSF, normal PASP, AVR without aortic stenosis or AI (mean gradient 22 mmHg)   Past Surgical History:  Procedure Laterality Date   ABDOMINAL HYSTERECTOMY     APPENDECTOMY  1973   BACK SURGERY     Lumbar Lam and discectomy x 2   BREAST BIOPSY Right 10/2011   BREAST LUMPECTOMY Right    CARDIAC CATHETERIZATION  2012   CARDIAC VALVE REPLACEMENT  2012   Aortic   CORONARY ARTERY BYPASS GRAFT  2012   DILATION AND CURETTAGE OF UTERUS     ENDOMETRIAL ABLATION     LAPAROSCOPIC LYSIS INTESTINAL ADHESIONS     LUMBAR [place]      DR. Red Christians- [place]   PACEMAKER IMPLANT N/A 01/17/2020   Procedure: PACEMAKER IMPLANT;  Surgeon: Constance Haw, MD;  Location: [facility];  Service: Cardiovascular;  Laterality: N/A;   RIGHT/LEFT HEART CATH AND CORONARY/GRAFT ANGIOGRAPHY N/A 04/07/2021   Procedure: RIGHT/LEFT HEART CATH AND CORONARY/GRAFT ANGIOGRAPHY;  Surgeon: Nelva Bush, MD;  Location: [place] [facility];  Service: Cardiovascular;  Laterality: N/A;   SKIN SURGERY     skin sarcomas removed   TONSILLECTOMY     1955   TONSILLECTOMY       Current Outpatient Medications  Medication Sig Dispense Refill   acetaminophen (TYLENOL) 325 MG tablet Take 1-2 tablets (325-650 mg total) by mouth every 4 (four) hours as needed for mild pain.     albuterol (VENTOLIN HFA) 108 (90 Base) MCG/ACT inhaler Inhale 2 puffs into the lungs every 6 (six) hours as needed (Congestion).     amLODipine (NORVASC) 2.5 MG tablet Take 1 tablet (2.5 mg total) by mouth daily. 30 tablet 11   aspirin EC 81 MG tablet Take 1 tablet (81 mg total) by mouth daily. Swallow whole. (Patient taking differently: Take 81 mg by mouth at bedtime. Swallow whole.) 90 tablet 3   Cholecalciferol 2000 units TABS Take 1 tablet (2,000 Units total) by mouth daily. 90 tablet 1   fluticasone (FLONASE) 50 MCG/ACT nasal spray USE 1 SPRAY IN EACH NOSTRIL EVERY DAY 32 g 11   furosemide (LASIX) 20 MG tablet TAKE 1 TABLET DAILY AS NEEDED FOR EDEMA OR WEIGHT GAIN. TAKE POTASSIUM 10MEQ WHEN YOU TAKE LASIX 90 tablet 0   irbesartan (AVAPRO) 150 MG tablet Take 0.5 tablets (75 mg total) by mouth in the morning and at bedtime. 90 tablet 1   isosorbide mononitrate (IMDUR) 30 MG 24 hr tablet TAKE 1 TABLET EVERY DAY 90 tablet 2   meclizine (ANTIVERT) 12.5 MG tablet Take 1 tablet (12.5 mg total) by mouth 3 (three) times daily as needed for dizziness. 90 tablet 3   Nitrofurantoin Monohyd Macro (MACROBID PO) Take by mouth as directed.     nitroGLYCERIN (NITROSTAT) 0.4 MG SL  tablet Place 1 tablet (0.4 mg total) under the tongue every 5 (five) minutes as needed for chest pain. 25 tablet 3   Omega-3 Fatty Acids (FISH OIL) 1000 MG CAPS Take 1,000 mg by mouth daily.     omeprazole (PRILOSEC) 40 MG capsule TAKE 1 CAPSULE (40 MG TOTAL) BY MOUTH DAILY. (Patient taking differently: 40 mg daily as needed (indigestion).) 90 capsule 1   potassium chloride (KLOR-CON) 10 MEQ tablet Take 1 tablet (10 mEq total) by mouth as needed. Take with lasix  only. 90 tablet 3   valACYclovir HCl (VALTREX PO) Take by mouth as directed.     No current facility-administered medications for this visit.    Allergies:   Crestor [rosuvastatin], Lipitor [atorvastatin], Sulfamethoxazole-trimethoprim, Tape, Amoxicillin, Neomycin, Neosporin [neomycin-bacitracin zn-polymyx], and Polysporin [bacitracin-polymyxin b]   Social History:  The patient  reports that she quit smoking about 21 years ago. Her smoking use included cigarettes. She has a 40.00 pack-year smoking history. She has quit using smokeless tobacco.  Her smokeless tobacco use included chew. She reports that she does not drink alcohol and does not use drugs.   Family History:  The patient's family history includes Breast cancer in her maternal aunt, mother, paternal grandmother, and sister; CAD in her father; Colon cancer in her maternal aunt and paternal uncle; Diabetes type II in her mother; Heart attack in her father; Heart disease in her mother; Hypertension in her mother; Hypothyroidism in her mother; Pancreatic cancer in an other family member.   ROS:  Please see the history of present illness.   Otherwise, review of systems is positive for none.   All other systems are reviewed and negative.   PHYSICAL EXAM: VS:  BP 124/68    Pulse 64    Ht '5\' 3"'$  (1.6 m)    Wt 163 lb 12.8 oz (74.3 kg)    SpO2 98%    BMI 29.02 kg/m  , BMI Body mass index is 29.02 kg/m. GEN: Well nourished, well developed, in no acute distress  HEENT: normal  Neck: no  JVD, carotid bruits, or masses Cardiac: RRR; no murmurs, rubs, or gallops,no edema  Respiratory:  clear to auscultation bilaterally, normal work of breathing GI: soft, nontender, nondistended, + BS MS: no deformity or atrophy  Skin: warm and dry, device site well healed Neuro:  Strength and sensation are intact Psych: euthymic mood, full affect  EKG:  EKG is not ordered today. Personal review of the ekg ordered 03/14/21 shows sinus rhythm, LBBB  Personal review of the device interrogation today. Results in [facility]: 06/24/2020: ALT 12; TSH 3.78 07/03/2020: NT-Pro BNP 364 04/02/2021: BUN 21; Creatinine, Ser 0.81; Platelets 230 04/07/2021: Hemoglobin 10.9; Potassium 4.1; Sodium 140    Lipid Panel     Component Value Date/Time   CHOL 140 06/24/2020 1346   CHOL 150 01/06/2018 1113   TRIG 201.0 (H) 06/24/2020 1346   HDL 42.00 06/24/2020 1346   HDL 42 01/06/2018 1113   CHOLHDL 3 06/24/2020 1346   VLDL 40.2 (H) 06/24/2020 1346   LDLCALC 61 01/06/2018 1113   LDLDIRECT 76.0 06/24/2020 1346     Wt Readings from Last 3 Encounters:  04/14/21 163 lb 12.8 oz (74.3 kg)  04/07/21 159 lb (72.1 kg)  03/14/21 162 lb 3.2 oz (73.6 kg)      Other studies Reviewed: Additional studies/ records that were reviewed today include: TTE 03/18/21  Review of the above records today demonstrates:   1. Left ventricular ejection fraction, by estimation, is 60 to 65%. The  left ventricle has normal function. The left ventricle has no regional  wall motion abnormalities. Left ventricular diastolic parameters are  consistent with Grade I diastolic  dysfunction (impaired relaxation).   2. Right ventricular systolic function is normal. The right ventricular  size is normal. There is normal pulmonary artery systolic pressure.   3. The mitral valve is normal in structure. No evidence of mitral valve  regurgitation. No evidence of mitral stenosis.   4. Prosthetic calve with  satisfactory function.  Please note gradients..  The aortic valve was not well visualized. Aortic valve regurgitation is  not visualized. No aortic stenosis is present. Procedure Date: 2012.  Aortic valve area, by VTI measures 1.26  cm. Aortic valve mean gradient measures 22.0 mmHg. Aortic valve Vmax  measures 3.02 m/s.   5. The inferior vena cava is normal in size with greater than 50%  respiratory variability, suggesting right atrial pressure of 3 mmHg.    Left heart catheterization 04/07/2021 Severe single-vessel coronary artery disease with chronic total occlusion of the mid RCA and 90% stenosis of the distal RCA (lesion is proximal to the RIMA anastomosis).  There is mild-moderate, nonobstructive disease involving the left coronary artery with up to 40% stenosis in the mid LAD. Widely patent RIMA-distal RCA. Low normal left and right heart filling pressures. Moderate stenosis of bioprosthetic aortic valve (mean gradient 30 mmHg, AVA 1.2 cm). Normal Fick cardiac output/index.  ASSESSMENT AND PLAN:  1.  Near syncope with intermittent complete heart block: Status post Medtronic dual-chamber pacemaker implanted 01/17/2020.  Device functioning appropriately.  No changes at this time.  2.  Coronary artery disease: Status post CABG x1.  Low risk Myoview.  No current chest pain.  No changes at this time.  3.  Aortic stenosis: Status post AVR.  Normally functioning on most recent echo.  Plan per primary cardiology.  Current medicines are reviewed at length with the patient today.   The patient does not have concerns regarding her medicines.  The following changes were made today: none  Labs/ tests ordered today include:  No orders of the defined types were placed in this encounter.    Disposition:   FU with Talina Pleitez 121 months  Signed, Kathryn Cosby Meredith Leeds, MD  04/14/2021 9:47 AM     [address] [place] [address] [phone] (office) [phone] (fax)

## 2021-04-17 ENCOUNTER — Ambulatory Visit (INDEPENDENT_AMBULATORY_CARE_PROVIDER_SITE_OTHER): Payer: Medicare HMO

## 2021-04-17 DIAGNOSIS — I442 Atrioventricular block, complete: Secondary | ICD-10-CM | POA: Diagnosis not present

## 2021-04-17 LAB — CUP PACEART REMOTE DEVICE CHECK
Battery Remaining Longevity: 168 mo
Battery Voltage: 3.1 V
Brady Statistic AP VP Percent: 0.02 %
Brady Statistic AP VS Percent: 22.14 %
Brady Statistic AS VP Percent: 0.01 %
Brady Statistic AS VS Percent: 77.82 %
Brady Statistic RA Percent Paced: 22.2 %
Brady Statistic RV Percent Paced: 0.03 %
Date Time Interrogation Session: 20230315195835
Implantable Lead Implant Date: 20211215
Implantable Lead Implant Date: 20211215
Implantable Lead Location: 753859
Implantable Lead Location: 753860
Implantable Lead Model: 5076
Implantable Lead Model: 5076
Implantable Pulse Generator Implant Date: 20211215
Lead Channel Impedance Value: 285 Ohm
Lead Channel Impedance Value: 399 Ohm
Lead Channel Impedance Value: 646 Ohm
Lead Channel Impedance Value: 703 Ohm
Lead Channel Pacing Threshold Amplitude: 0.375 V
Lead Channel Pacing Threshold Amplitude: 0.625 V
Lead Channel Pacing Threshold Pulse Width: 0.4 ms
Lead Channel Pacing Threshold Pulse Width: 0.4 ms
Lead Channel Sensing Intrinsic Amplitude: 1.25 mV
Lead Channel Sensing Intrinsic Amplitude: 1.25 mV
Lead Channel Sensing Intrinsic Amplitude: 15.75 mV
Lead Channel Sensing Intrinsic Amplitude: 15.75 mV
Lead Channel Setting Pacing Amplitude: 1.5 V
Lead Channel Setting Pacing Amplitude: 2 V
Lead Channel Setting Pacing Pulse Width: 0.4 ms
Lead Channel Setting Sensing Sensitivity: 1.2 mV

## 2021-04-21 DIAGNOSIS — T82857A Stenosis of cardiac prosthetic devices, implants and grafts, initial encounter: Secondary | ICD-10-CM | POA: Insufficient documentation

## 2021-04-21 NOTE — Progress Notes (Signed)
?Cardiology Office Note:   ? ?Date:  04/22/2021  ? ?ID:  Heather Crawford, DOB [birth year], MRN [id number] ? ?PCP:  Janith Lima, MD  ?[facility] HeartCare Providers ?Cardiologist:  Sherren Mocha, MD ?Cardiology APP:  Liliane Shi, PA-C  ?Electrophysiologist:  Will Meredith Leeds, MD    ?Referring MD: Janith Lima, MD  ? ?Chief Complaint:  F/u after cardiac cath ?  ? ?Patient Profile: ?Valvular heart disease, Rheumatic Fever as a child ?Aortic stenosis, s/p bioprosthetic AVR in 2012 ?Echocardiogram 9/18: mean 14 mmHg ?Echo 3/21: EF 65-70, Gr 2 DD, normally functioning AVR (mean 14 mmHg) ?Echocardiogram 2/23: AVR w mean 22 mmHg (cath 3/23: mean 30 mmHg) ?Mitral regurgitation ?Echo 3/21: Mild-moderate MR ?Echocardiogram 2/23: no MR ?Coronary artery disease  ?S/p CABG x 1 in 2012 [facility]) ?Myoview 3/21: No ischemia, EF 68; low risk>>Chest pain improved on nitrates  ?Cath 3/23: patent R-RCA, non-obs CAD in LCx, LAD - med Rx  ?(HFpEF) heart failure with preserved ejection fraction  ?Echo 3/21: EF 65-70, GRII DD ?[place] 3/23: low normal R and L heart filling pressures  ?Carotid artery disease ?[place] 10/22: Bilateral ICA 1-39 ?Left subclavian stenosis ?LBBB ?Symptomatic bradycardia ?S/p Pacemaker in 12/21 ?Vertigo 2/2 labyrinthitis  ?GERD ?Impaired glucose tolerance ?  ?Prior CV Studies: ?Cardiac catheterization 04/07/21 ?RCA prox 40, mid 100 CTO, dist 90 ?LCx mid 30; OM1 25 ?LAD mid 2 ?RIMA-RCA patent ?Low normal L and R heart filling pressures ?Normal Fick CO/CI ?Mod stenosis of bioprosthetic AVR (mean 30 mmHg) ? ?Echocardiogram 2/14/232 ?EF 60-65, no RWMA, Gr 1 DD, normal RVSF, normal PASP, no MR, AVR w no AI and mean 22 mmHg ? ?Carotid US 11/15/2020 ?Bilateral ICA 1-39; left subclavian stenosis ?  ?Myoview 05/03/2019 ?EF 68, small fixed apical defect, no ischemia, low risk ?  ?Echocardiogram 05/03/2019 ?EF 65-70, no RWMA, GRII DD, normal RVSF, RVSP 28.8, mild-moderate MR, s/p AVR -normally functioning, no PVL ?   ?Echocardiogram 10/14/2016 ?Mild LVH, EF 60-65, Normally functioning bioprosthetic aortic valve replacement (mean 14, peak 26), mild MR, moderate LAE ?  ?Cardiac catheterization 04/03/2010 ?EF 55 ?RCA ostial 40-50, distal 90-95 ?LM normal ?LCx normal ?LAD mid 30 ?>> CABG ? ? ?History of Present Illness:   ?Heather Crawford is a 76 y.o. female with the above problem list.  She was last seen in 2/23.  She had symptoms concerning for exertional angina.  She also noted ongoing dyspnea on exertion.  I set her up for a R and L cardiac catheterization which demonstrated a patent RIMA-RCA and mild to mod non-obstructive CAD elsewhere.  She had low normal filling pressures and mod stenosis of the bioprosthetic AV.  The mean gradient was 30 mmHg.  Her mean gradient on the most recent echocardiogram in 2/23 was 22 mmHg.  Her AVR stenosis is not felt to be the major cause of her symptoms.  She returns for f/u.   ? ?She is here alone.  She notes some improved breathing since she was placed on amlodipine.  She has not really gone back to babysitting her grandchildren yet.  She is very active when she watches them.  She sleeps on an incline chronically.  She has not had syncope, significant leg edema.  Of note, she has gone back to taking half dose of simvastatin 40 mg (20 mg daily).    ?   ?Past Medical History:  ?Diagnosis Date  ? Allergic rhinitis, cause unspecified   ? Aortic stenosis   ? a.  echo 1/12 w/mildly dilated LV, EF 45-50% w/paradoxical septal motion consistent w/ LBBB, moderate aortic stenosis w/mean gradient 27 mmHg, trivial AI, mild to moderate MR w/calcified mitral valve;   b. s/p AVR with 21 mm Magna Ease valve  ? Aortic valve replaced 2012  ? Arthritis   ? joint pain  ? CAD (coronary artery disease)   ? a. s/p CABG 4/12: RIMA-RCA  ? CAD, multiple vessel   ? sees Dr Aundra Dubin every 3-4 months  ? Cancer [facility])   ? skin sarcomas removed  ? Carotid artery disease ([place])   ? Carotid US 10/21: Bilateral ICA 1-39; left  subclavian stenosis  ? CHF (congestive heart failure) ([place])   ? chronic diastolic CHF  ? Chronic back pain   ? H/O BACK SURGERY  ? Coronary artery disease   ? adenosine myoview 2/12 w/EF 56%, ISCHEMIA W/SCAR IN THE MID TO APICAL ANTERIO WALL, SEPTAL WALL, AND APEX. LHC 3/12 W/EF 55%, 40% OSTIAL RCA, 60% MID RCA, 95% DISTAL RC A, INTERVENTION  ATTEMPTED BUT UNABLE TO ADEQUADATELY SEAT  CATHETER.  ? Diverticulitis of colon (without mention of hemorrhage)(562.11)   ? Diverticulosis   ? Esophageal reflux   ? Glucose intolerance (impaired glucose tolerance)   ? A1C 6.3 (4/12)  ? H/O hiatal hernia   ? H/O vertigo   ? with fluid  in ears  ? Heart murmur   ? Hemorrhoids   ? Hemorrhoids   ? History of rheumatic fever   ? Hx of hysterectomy   ? Hx: UTI (urinary tract infection)   ? Hyperlipidemia   ? LBBB (left bundle branch block)   ? Myocardial infarction [facility]) 2002  ? Pneumonia   ? PONV (postoperative nausea and vomiting)   ? PPD positive   ? in the past  ? Snoring   ? Squamous cell carcinoma of skin   ? nose  ? Stenosis of left subclavian artery (HCC)   ? Carotid US 10/22: Bilateral ICA 1-39; L subclavian stenosis  ? Urinary frequency   ? Valvular heart disease s/p AVR in 2012 02/07/2010  ? AoV Replacement with Bovine prosthesis ' 12 Echo 2/23: EF 60-65, no RWMA, GR 1 DD, normal RVSF, normal PASP, AVR without aortic stenosis or AI (mean gradient 22 mmHg)  ? ?Current Medications: ?Current Meds  ?Medication Sig  ? acetaminophen (TYLENOL) 325 MG tablet Take 1-2 tablets (325-650 mg total) by mouth every 4 (four) hours as needed for mild pain.  ? albuterol (VENTOLIN HFA) 108 (90 Base) MCG/ACT inhaler Inhale 2 puffs into the lungs every 6 (six) hours as needed (Congestion).  ? amLODipine (NORVASC) 2.5 MG tablet Take 1 tablet (2.5 mg total) by mouth daily.  ? aspirin EC 81 MG tablet Take 1 tablet (81 mg total) by mouth daily. Swallow whole. (Patient taking differently: Take 81 mg by mouth at bedtime. Swallow whole.)  ?  Cholecalciferol 2000 units TABS Take 1 tablet (2,000 Units total) by mouth daily.  ? fluticasone (FLONASE) 50 MCG/ACT nasal spray USE 1 SPRAY IN EACH NOSTRIL EVERY DAY  ? furosemide (LASIX) 20 MG tablet TAKE 1 TABLET DAILY AS NEEDED FOR EDEMA OR WEIGHT GAIN. TAKE POTASSIUM 10MEQ WHEN YOU TAKE LASIX  ? irbesartan (AVAPRO) 150 MG tablet Take 0.5 tablets (75 mg total) by mouth in the morning and at bedtime.  ? isosorbide mononitrate (IMDUR) 30 MG 24 hr tablet TAKE 1 TABLET EVERY DAY  ? meclizine (ANTIVERT) 12.5 MG tablet Take 1 tablet (12.5 mg total) by  mouth 3 (three) times daily as needed for dizziness.  ? nitroGLYCERIN (NITROSTAT) 0.4 MG SL tablet Place 1 tablet (0.4 mg total) under the tongue every 5 (five) minutes as needed for chest pain.  ? Omega-3 Fatty Acids (FISH OIL) 1000 MG CAPS Take 1,000 mg by mouth daily.  ? omeprazole (PRILOSEC) 40 MG capsule TAKE 1 CAPSULE (40 MG TOTAL) BY MOUTH DAILY. (Patient taking differently: 40 mg daily.)  ? potassium chloride (KLOR-CON) 10 MEQ tablet Take 1 tablet (10 mEq total) by mouth as needed. Take with lasix only.  ? simvastatin (ZOCOR) 40 MG tablet Take 20 mg by mouth daily.  ?  ?Allergies:   Crestor [rosuvastatin], Lipitor [atorvastatin], Sulfamethoxazole-trimethoprim, Tape, Amoxicillin, Neomycin, Neosporin [neomycin-bacitracin zn-polymyx], and Polysporin [bacitracin-polymyxin b]  ? ?Social History  ? ?Tobacco Use  ? Smoking status: Former  ?  Packs/day: 1.00  ?  Years: 40.00  ?  Pack years: 40.00  ?  Types: Cigarettes  ?  Quit date: 02/03/2000  ?  Years since quitting: 21.2  ? Smokeless tobacco: Former  ?  Types: Chew  ? Tobacco comments:  ?  discussed LDCT   ?Vaping Use  ? Vaping Use: Never used  ?Substance Use Topics  ? Alcohol use: No  ?  Alcohol/week: 0.0 standard drinks  ? Drug use: No  ?  ?Family Hx: ?The patient's family history includes Breast cancer in her maternal aunt, mother, paternal grandmother, and sister; CAD in her father; Colon cancer in her maternal  aunt and paternal uncle; Diabetes type II in her mother; Heart attack in her father; Heart disease in her mother; Hypertension in her mother; Hypothyroidism in her mother; Pancreatic cancer in an other family member. There is no

## 2021-04-22 ENCOUNTER — Ambulatory Visit: Payer: Medicare HMO | Admitting: Physician Assistant

## 2021-04-22 ENCOUNTER — Encounter: Payer: Self-pay | Admitting: Physician Assistant

## 2021-04-22 ENCOUNTER — Other Ambulatory Visit: Payer: Self-pay

## 2021-04-22 VITALS — BP 122/50 | HR 77 | Ht 63.0 in | Wt 165.0 lb

## 2021-04-22 DIAGNOSIS — I38 Endocarditis, valve unspecified: Secondary | ICD-10-CM

## 2021-04-22 DIAGNOSIS — D649 Anemia, unspecified: Secondary | ICD-10-CM | POA: Diagnosis not present

## 2021-04-22 DIAGNOSIS — T82857D Stenosis of cardiac prosthetic devices, implants and grafts, subsequent encounter: Secondary | ICD-10-CM

## 2021-04-22 DIAGNOSIS — E785 Hyperlipidemia, unspecified: Secondary | ICD-10-CM | POA: Diagnosis not present

## 2021-04-22 DIAGNOSIS — I5032 Chronic diastolic (congestive) heart failure: Secondary | ICD-10-CM | POA: Diagnosis not present

## 2021-04-22 DIAGNOSIS — I10 Essential (primary) hypertension: Secondary | ICD-10-CM | POA: Diagnosis not present

## 2021-04-22 DIAGNOSIS — R0602 Shortness of breath: Secondary | ICD-10-CM | POA: Diagnosis not present

## 2021-04-22 DIAGNOSIS — I25119 Atherosclerotic heart disease of native coronary artery with unspecified angina pectoris: Secondary | ICD-10-CM | POA: Diagnosis not present

## 2021-04-22 LAB — CBC
Hematocrit: 34.6 % (ref 34.0–46.6)
Hemoglobin: 11.5 g/dL (ref 11.1–15.9)
MCH: 29.3 pg (ref 26.6–33.0)
MCHC: 33.2 g/dL (ref 31.5–35.7)
MCV: 88 fL (ref 79–97)
Platelets: 237 10*3/uL (ref 150–450)
RBC: 3.92 x10E6/uL (ref 3.77–5.28)
RDW: 14.8 % (ref 11.7–15.4)
WBC: 6.3 10*3/uL (ref 3.4–10.8)

## 2021-04-22 NOTE — Assessment & Plan Note (Signed)
She has a prior history of smoking.  She notes a history of bronchitis once a year.  Otherwise, she does not really describe a chronic cough.  We discussed +/- referral to pulmonology.  She notes her breathing has improved.  She would like to hold off on referral for now.  She knows to contact me for referral if her symptoms persist/worsen. ?

## 2021-04-22 NOTE — Assessment & Plan Note (Signed)
Called to tolerating statins.  She notes that she is currently taking half dose of her simvastatin 40 mg (20 mg daily).  She seems to be tolerating this.  I will follow-up with lipids and LFTs in 3 months.  If she does not reach goal, consider adding ezetimibe versus referral to our lipid clinic for consideration of PCSK9 inhibitor. ?

## 2021-04-22 NOTE — Assessment & Plan Note (Signed)
S/p bioprosthetic AVR in 2012.  Recent echo with mean gradient 22 and cardiac cath with mean gradient 30.  As noted, she will have follow-up echo in 1 year.  Continue SBE prophylaxis. ?

## 2021-04-22 NOTE — Assessment & Plan Note (Signed)
BP is well controlled.  Her diastolic is s/w low.  She has some orthostasis symptoms.  I have asked her to use compression hose.  If she continues to have issues with dizziness with standing or bending over, we may need to cut back on her meds (consider changing Irbesartan to Losartan low dose or cutting her Isosorbide in 1/2).   ?? Continue amlodipine 2.5 mg daily, irbesartan 75 mg daily, isosorbide 30 mg daily ?

## 2021-04-22 NOTE — Assessment & Plan Note (Signed)
Mod stenosis by recent cardiac catheterization.  Her mean was 30 mmHg.  Her mean gradient on recent echocardiogram was 22 mmHg.  As noted, this is not the cause of her current symptoms.  We will need to keep a close eye on her AVR.  I will make sure she has an echocardiogram scheduled in Feb 2024.   ?

## 2021-04-22 NOTE — Assessment & Plan Note (Signed)
S/p CABG in 2012 with RIMA-RCA.  Recent cardiac catheterization with patent RIMA-RCA and mild to mod non-obstructive disease elsewhere.  She has had less shortness of breath and no jaw pain with exertion since starting on Amlodipine.   ?? Continue Amlodipine 2.5 mg once daily, ASA 81 mg once daily, Isosorbide 30 mg once daily, Simvastatin 20 mg once daily. ?? F/u 6 mos. ?

## 2021-04-22 NOTE — Assessment & Plan Note (Signed)
Low normal filling pressures on recent RHC.  Volume status is stable.  She seems to be NYHA II.  Given her dizziness with standing quickly and her low normal filling pressures, I do not think we should attempt MRA or SGLT2i at this time.  Continue Lasix 20 mg prn. ?

## 2021-04-22 NOTE — Assessment & Plan Note (Signed)
Hemoglobin was low on precath labs.  Repeat CBC today. ?

## 2021-04-22 NOTE — Patient Instructions (Signed)
Medication Instructions:  ?Your physician recommends that you continue on your current medications as directed. Please refer to the Current Medication list given to you today. ? ?*If you need a refill on your cardiac medications before your next appointment, please call your pharmacy* ? ? ?Lab Work: ?TODAY:  CBC ? ?07/25/21:  COME TO THE LAB, FASTING, FOR:  LIPID / LFT.Marland Kitchen You can come anytime from 7:15 a.m - 5:00. ? ?If you have labs (blood work) drawn today and your tests are completely normal, you will receive your results only by: ?MyChart Message (if you have MyChart) OR ?A paper copy in the mail ?If you have any lab test that is abnormal or we need to change your treatment, we will call you to review the results. ? ? ?Testing/Procedures: ?Your physician has requested that you have an echocardiogram 03/2022. Echocardiography is a painless test that uses sound waves to create images of your heart. It provides your doctor with information about the size and shape of your heart and how well your heart?s chambers and valves are working. This procedure takes approximately one hour. There are no restrictions for this procedure. ? ? ? ?Follow-Up: ?At [facility], you and your health needs are our priority.  As part of our continuing mission to provide you with exceptional heart care, we have created designated Provider Care Teams.  These Care Teams include your primary Cardiologist (physician) and Advanced Practice Providers (APPs -  Physician Assistants and Nurse Practitioners) who all work together to provide you with the care you need, when you need it. ? ?We recommend signing up for the patient portal called "MyChart".  Sign up information is provided on this After Visit Summary.  MyChart is used to connect with patients for Virtual Visits (Telemedicine).  Patients are able to view lab/test results, encounter notes, upcoming appointments, etc.  Non-urgent messages can be sent to your provider as well.   ?To learn  more about what you can do with MyChart, go to [email or web address].   ? ?Your next appointment:   ?10/07/21 arrivea at 10:15  ? ?The format for your next appointment:   ?In Person ? ?Provider:   ?Richardson Dopp, PA-C       ? ? ?Other Instructions ? ?

## 2021-04-25 NOTE — Progress Notes (Signed)
Remote pacemaker transmission.   

## 2021-05-13 ENCOUNTER — Other Ambulatory Visit: Payer: Self-pay | Admitting: *Deleted

## 2021-05-13 MED ORDER — AMLODIPINE BESYLATE 2.5 MG PO TABS: 2.5000 mg | ORAL_TABLET | Freq: Every day | ORAL | 1 refills | Status: DC

## 2021-05-24 DIAGNOSIS — J309 Allergic rhinitis, unspecified: Secondary | ICD-10-CM | POA: Diagnosis not present

## 2021-05-24 DIAGNOSIS — J040 Acute laryngitis: Secondary | ICD-10-CM | POA: Diagnosis not present

## 2021-05-24 DIAGNOSIS — R059 Cough, unspecified: Secondary | ICD-10-CM | POA: Diagnosis not present

## 2021-06-09 ENCOUNTER — Other Ambulatory Visit: Payer: Self-pay | Admitting: Internal Medicine

## 2021-06-09 DIAGNOSIS — I10 Essential (primary) hypertension: Secondary | ICD-10-CM

## 2021-06-09 DIAGNOSIS — E118 Type 2 diabetes mellitus with unspecified complications: Secondary | ICD-10-CM

## 2021-06-19 DIAGNOSIS — Z1231 Encounter for screening mammogram for malignant neoplasm of breast: Secondary | ICD-10-CM | POA: Diagnosis not present

## 2021-06-24 ENCOUNTER — Ambulatory Visit (INDEPENDENT_AMBULATORY_CARE_PROVIDER_SITE_OTHER): Payer: Medicare HMO

## 2021-06-24 VITALS — BP 110/60 | HR 60 | Temp 97.4°F | Resp 16 | Ht 63.0 in | Wt 161.6 lb

## 2021-06-24 DIAGNOSIS — Z0000 Encounter for general adult medical examination without abnormal findings: Secondary | ICD-10-CM

## 2021-06-24 NOTE — Patient Instructions (Signed)
Ms. Selinger , Thank you for taking time to come for your Medicare Wellness Visit. I appreciate your ongoing commitment to your health goals. Please review the following plan we discussed and let me know if I can assist you in the future.   Screening recommendations/referrals: Colonoscopy: 04/30/2015; due every 5 years Mammogram: scheduled for 06/2021 Bone Density: 02/27/2020; due very 2-3 years Recommended yearly ophthalmology/optometry visit for glaucoma screening and checkup Recommended yearly dental visit for hygiene and checkup  Vaccinations: Influenza vaccine: declined Pneumococcal vaccine: 08/16/2013, 09/12/2019 Tdap vaccine: 06/24/2020; due every 10 years Shingles vaccine: declined   Covid-19: 02/14/2019, 23/03/2019  Advanced directives: Yes; needs updating according to patient  Conditions/risks identified: Yes; Cardiology  Next appointment: Please schedule your next Medicare Wellness Visit with your Nurse Health Advisor in 1 year by calling [phone].   Preventive Care 65 Years and Older, Female Preventive care refers to lifestyle choices and visits with your health care provider that can promote health and wellness. What does preventive care include? A yearly physical exam. This is also called an annual well check. Dental exams once or twice a year. Routine eye exams. Ask your health care provider how often you should have your eyes checked. Personal lifestyle choices, including: Daily care of your teeth and gums. Regular physical activity. Eating a healthy diet. Avoiding tobacco and drug use. Limiting alcohol use. Practicing safe sex. Taking low-dose aspirin every day. Taking vitamin and mineral supplements as recommended by your health care provider. What happens during an annual well check? The services and screenings done by your health care provider during your annual well check will depend on your age, overall health, lifestyle risk factors, and family history of  disease. Counseling  Your health care provider may ask you questions about your: Alcohol use. Tobacco use. Drug use. Emotional well-being. Home and relationship well-being. Sexual activity. Eating habits. History of falls. Memory and ability to understand (cognition). Work and work Statistician. Reproductive health. Screening  You may have the following tests or measurements: Height, weight, and BMI. Blood pressure. Lipid and cholesterol levels. These may be checked every 5 years, or more frequently if you are over 62 years old. Skin check. Lung cancer screening. You may have this screening every year starting at age 55 if you have a 30-pack-year history of smoking and currently smoke or have quit within the past 15 years. Fecal occult blood test (FOBT) of the stool. You may have this test every year starting at age 60. Flexible sigmoidoscopy or colonoscopy. You may have a sigmoidoscopy every 5 years or a colonoscopy every 10 years starting at age 57. Hepatitis C blood test. Hepatitis B blood test. Sexually transmitted disease (STD) testing. Diabetes screening. This is done by checking your blood sugar (glucose) after you have not eaten for a while (fasting). You may have this done every 1-3 years. Bone density scan. This is done to screen for osteoporosis. You may have this done starting at age 3. Mammogram. This may be done every 1-2 years. Talk to your health care provider about how often you should have regular mammograms. Talk with your health care provider about your test results, treatment options, and if necessary, the need for more tests. Vaccines  Your health care provider may recommend certain vaccines, such as: Influenza vaccine. This is recommended every year. Tetanus, diphtheria, and acellular pertussis (Tdap, Td) vaccine. You may need a Td booster every 10 years. Zoster vaccine. You may need this after age 15. Pneumococcal 13-valent conjugate (PCV13) vaccine.  One  dose is recommended after age 6. Pneumococcal polysaccharide (PPSV23) vaccine. One dose is recommended after age 77. Talk to your health care provider about which screenings and vaccines you need and how often you need them. This information is not intended to replace advice given to you by your health care provider. Make sure you discuss any questions you have with your health care provider. Document Released: 02/15/2015 Document Revised: 10/09/2015 Document Reviewed: 11/20/2014 Elsevier Interactive Patient Education  2017 [place] Prevention in the Home Falls can cause injuries. They can happen to people of all ages. There are many things you can do to make your home safe and to help prevent falls. What can I do on the outside of my home? Regularly fix the edges of walkways and driveways and fix any cracks. Remove anything that might make you trip as you walk through a door, such as a raised step or threshold. Trim any bushes or trees on the path to your home. Use bright outdoor lighting. Clear any walking paths of anything that might make someone trip, such as rocks or tools. Regularly check to see if handrails are loose or broken. Make sure that both sides of any steps have handrails. Any raised decks and porches should have guardrails on the edges. Have any leaves, snow, or ice cleared regularly. Use sand or salt on walking paths during winter. Clean up any spills in your garage right away. This includes oil or grease spills. What can I do in the bathroom? Use night lights. Install grab bars by the toilet and in the tub and shower. Do not use towel bars as grab bars. Use non-skid mats or decals in the tub or shower. If you need to sit down in the shower, use a plastic, non-slip stool. Keep the floor dry. Clean up any water that spills on the floor as soon as it happens. Remove soap buildup in the tub or shower regularly. Attach bath mats securely with double-sided  non-slip rug tape. Do not have throw rugs and other things on the floor that can make you trip. What can I do in the bedroom? Use night lights. Make sure that you have a light by your bed that is easy to reach. Do not use any sheets or blankets that are too big for your bed. They should not hang down onto the floor. Have a firm chair that has side arms. You can use this for support while you get dressed. Do not have throw rugs and other things on the floor that can make you trip. What can I do in the kitchen? Clean up any spills right away. Avoid walking on wet floors. Keep items that you use a lot in easy-to-reach places. If you need to reach something above you, use a strong step stool that has a grab bar. Keep electrical cords out of the way. Do not use floor polish or wax that makes floors slippery. If you must use wax, use non-skid floor wax. Do not have throw rugs and other things on the floor that can make you trip. What can I do with my stairs? Do not leave any items on the stairs. Make sure that there are handrails on both sides of the stairs and use them. Fix handrails that are broken or loose. Make sure that handrails are as long as the stairways. Check any carpeting to make sure that it is firmly attached to the stairs. Fix any carpet that is loose or  worn. Avoid having throw rugs at the top or bottom of the stairs. If you do have throw rugs, attach them to the floor with carpet tape. Make sure that you have a light switch at the top of the stairs and the bottom of the stairs. If you do not have them, ask someone to add them for you. What else can I do to help prevent falls? Wear shoes that: Do not have high heels. Have rubber bottoms. Are comfortable and fit you well. Are closed at the toe. Do not wear sandals. If you use a stepladder: Make sure that it is fully opened. Do not climb a closed stepladder. Make sure that both sides of the stepladder are locked into place. Ask  someone to hold it for you, if possible. Clearly mark and make sure that you can see: Any grab bars or handrails. First and last steps. Where the edge of each step is. Use tools that help you move around (mobility aids) if they are needed. These include: Canes. Walkers. Scooters. Crutches. Turn on the lights when you go into a dark area. Replace any light bulbs as soon as they burn out. Set up your furniture so you have a clear path. Avoid moving your furniture around. If any of your floors are uneven, fix them. If there are any pets around you, be aware of where they are. Review your medicines with your doctor. Some medicines can make you feel dizzy. This can increase your chance of falling. Ask your doctor what other things that you can do to help prevent falls. This information is not intended to replace advice given to you by your health care provider. Make sure you discuss any questions you have with your health care provider. Document Released: 11/15/2008 Document Revised: 06/27/2015 Document Reviewed: 02/23/2014 Elsevier Interactive Patient Education  2017 [employer].

## 2021-06-24 NOTE — Progress Notes (Signed)
Subjective:   Heather Crawford is a 76 y.o. female who presents for Medicare Annual (Subsequent) preventive examination.  Review of Systems     Cardiac Risk Factors include: advanced age (>39mn, >>33women);dyslipidemia;family history of premature cardiovascular disease;hypertension     Objective:    Today's Vitals   06/24/21 1243  BP: 110/60  Pulse: 60  Resp: 16  Temp: (!) 97.4 F (36.3 C)  SpO2: 97%  Weight: 161 lb 9.6 oz (73.3 kg)  Height: '5\' 3"'$  (1.6 m)  PainSc: 0-No pain   Body mass index is 28.63 kg/m.     06/24/2021   12:47 PM 04/07/2021    7:27 AM 06/19/2020   11:38 AM 01/18/2020    1:00 AM 01/17/2020    6:41 PM 01/17/2020    3:50 PM 05/04/2019   10:37 AM  Advanced Directives  Does Patient Have a Medical Advance Directive? Yes Yes Yes Yes  Yes Yes  Type of Advance Directive Living will;Healthcare Power of [place]Living will Living will;Healthcare Power of APastoriaLiving will [place]entworthLiving will [place]Bur[place] OakLiving will HRio del MarLiving will  Does patient want to make changes to medical advance directive? No - Patient declined No - Patient declined No - Patient declined No - Patient declined   No - Patient declined  Copy of [place] Chart? No - copy requested  No - copy requested No - copy requested No - copy requested  No - copy requested  Would patient like information on creating a medical advance directive?  No - Patient declined         Current Medications (verified) Outpatient Encounter Medications as of 06/24/2021  Medication Sig   acetaminophen (TYLENOL) 325 MG tablet Take 1-2 tablets (325-650 mg total) by mouth every 4 (four) hours as needed for mild pain.   albuterol (VENTOLIN HFA) 108 (90 Base) MCG/ACT inhaler Inhale 2 puffs into the lungs every 6 (six) hours as needed (Congestion).   amLODipine (NORVASC) 2.5 MG  tablet Take 1 tablet (2.5 mg total) by mouth daily.   aspirin EC 81 MG tablet Take 1 tablet (81 mg total) by mouth daily. Swallow whole. (Patient taking differently: Take 81 mg by mouth at bedtime. Swallow whole.)   Cholecalciferol 2000 units TABS Take 1 tablet (2,000 Units total) by mouth daily.   fluticasone (FLONASE) 50 MCG/ACT nasal spray USE 1 SPRAY IN EACH NOSTRIL EVERY DAY   furosemide (LASIX) 20 MG tablet TAKE 1 TABLET DAILY AS NEEDED FOR EDEMA OR WEIGHT GAIN. TAKE POTASSIUM 10MEQ WHEN YOU TAKE LASIX   irbesartan (AVAPRO) 150 MG tablet TAKE 1/2 TABLET IN THE MORNING  AND TAKE 1/2 TABLET AT BEDTIME   isosorbide mononitrate (IMDUR) 30 MG 24 hr tablet TAKE 1 TABLET EVERY DAY   meclizine (ANTIVERT) 12.5 MG tablet Take 1 tablet (12.5 mg total) by mouth 3 (three) times daily as needed for dizziness.   Omega-3 Fatty Acids (FISH OIL) 1000 MG CAPS Take 1,000 mg by mouth daily.   omeprazole (PRILOSEC) 40 MG capsule TAKE 1 CAPSULE (40 MG TOTAL) BY MOUTH DAILY. (Patient taking differently: 40 mg daily.)   simvastatin (ZOCOR) 40 MG tablet Take 20 mg by mouth daily.   nitroGLYCERIN (NITROSTAT) 0.4 MG SL tablet Place 1 tablet (0.4 mg total) under the tongue every 5 (five) minutes as needed for chest pain.   potassium chloride (KLOR-CON) 10 MEQ tablet Take 1 tablet (10 mEq  total) by mouth as needed. Take with lasix only.   No facility-administered encounter medications on file as of 06/24/2021.    Allergies (verified) Crestor [rosuvastatin], Lipitor [atorvastatin], Sulfamethoxazole-trimethoprim, Tape, Amoxicillin, Neomycin, Neosporin [neomycin-bacitracin zn-polymyx], and Polysporin [bacitracin-polymyxin b]   History: Past Medical History:  Diagnosis Date   Allergic rhinitis, cause unspecified    Aortic stenosis    a.  echo 1/12 w/mildly dilated LV, EF 45-50% w/paradoxical septal motion consistent w/ LBBB, moderate aortic stenosis w/mean gradient 27 mmHg, trivial AI, mild to moderate MR w/calcified  mitral valve;   b. s/p AVR with 21 mm Magna Ease valve   Aortic valve replaced 2012   Arthritis    joint pain   CAD (coronary artery disease)    a. s/p CABG 4/12: RIMA-RCA   CAD, multiple vessel    sees Dr Aundra Dubin every 3-4 months   Cancer [facility])    skin sarcomas removed   Carotid artery disease ([place])    Carotid US 10/21: Bilateral ICA 1-39; left subclavian stenosis   CHF (congestive heart failure) (HCC)    chronic diastolic CHF   Chronic back pain    H/O BACK SURGERY   Coronary artery disease    adenosine myoview 2/12 w/EF 56%, ISCHEMIA W/SCAR IN THE MID TO APICAL ANTERIO WALL, SEPTAL WALL, AND APEX. LHC 3/12 W/EF 55%, 40% OSTIAL RCA, 60% MID RCA, 95% DISTAL RC A, INTERVENTION  ATTEMPTED BUT UNABLE TO ADEQUADATELY SEAT  CATHETER.   Diverticulitis of colon (without mention of hemorrhage)(562.11)    Diverticulosis    Esophageal reflux    Glucose intolerance (impaired glucose tolerance)    A1C 6.3 (4/12)   H/O hiatal hernia    H/O vertigo    with fluid  in ears   Heart murmur    Hemorrhoids    Hemorrhoids    History of rheumatic fever    Hx of hysterectomy    Hx: UTI (urinary tract infection)    Hyperlipidemia    LBBB (left bundle branch block)    Myocardial infarction ([place]) 2002   Pneumonia    PONV (postoperative nausea and vomiting)    PPD positive    in the past   Snoring    Squamous cell carcinoma of skin    nose   Stenosis of left subclavian artery ([place])    Carotid US 10/22: Bilateral ICA 1-39; L subclavian stenosis   Urinary frequency    Valvular heart disease s/p AVR in 2012 02/07/2010   AoV Replacement with Bovine prosthesis ' 12 Echo 2/23: EF 60-65, no RWMA, GR 1 DD, normal RVSF, normal PASP, AVR without aortic stenosis or AI (mean gradient 22 mmHg)   Past Surgical History:  Procedure Laterality Date   ABDOMINAL HYSTERECTOMY     APPENDECTOMY  1973   BACK SURGERY     Lumbar Lam and discectomy x 2   BREAST BIOPSY Right 10/2011   BREAST LUMPECTOMY Right     CARDIAC CATHETERIZATION  2012   CARDIAC VALVE REPLACEMENT  2012   Aortic   CORONARY ARTERY BYPASS GRAFT  2012   DILATION AND CURETTAGE OF UTERUS     ENDOMETRIAL ABLATION     LAPAROSCOPIC LYSIS INTESTINAL ADHESIONS     LUMBAR [place] SURGERY     DR. Red Christians- [place]   PACEMAKER IMPLANT N/A 01/17/2020   Procedure: PACEMAKER IMPLANT;  Surgeon: Constance Haw, MD;  Location: [facility];  Service: Cardiovascular;  Laterality: N/A;   RIGHT/LEFT HEART CATH AND CORONARY/GRAFT ANGIOGRAPHY  N/A 04/07/2021   Procedure: RIGHT/LEFT HEART CATH AND CORONARY/GRAFT ANGIOGRAPHY;  Surgeon: Nelva Bush, MD;  Location: [place] [facility];  Service: Cardiovascular;  Laterality: N/A;   SKIN SURGERY     skin sarcomas removed   TONSILLECTOMY     1955   TONSILLECTOMY     Family History  Problem Relation Age of Onset   Hypertension Mother    Breast cancer Mother    Hypothyroidism Mother    Diabetes type II Mother    Heart disease Mother    Heart attack Father        CABG @ 58   CAD Father    Breast cancer Sister        x 2   Colon cancer Maternal Aunt    Breast cancer Maternal Aunt    Colon cancer Paternal Uncle    Pancreatic cancer Other        PGA   Breast cancer Paternal Grandmother    Stomach cancer Neg Hx    Throat cancer Neg Hx    Esophageal cancer Neg Hx    Social History   Socioeconomic History   Marital status: Married    Spouse name: Not on file   Number of children: 2   Years of education: Not on file   Highest education level: Not on file  Occupational History   Occupation: retired    Fish farm manager: RETIRED    Comment: [employer], TEFL teacher, CERTIFIED AS SPECIAL ED TEACHER  Tobacco Use   Smoking status: Former    Packs/day: 1.00    Years: 40.00    Pack years: 40.00    Types: Cigarettes    Quit date: 02/03/2000    Years since quitting: 21.4   Smokeless tobacco: Former    Types: Chew   Tobacco comments:    discussed LDCT   Vaping Use   Vaping Use: Never used   Substance and Sexual Activity   Alcohol use: No    Alcohol/week: 0.0 standard drinks   Drug use: No   Sexual activity: Yes  Other Topics Concern   Not on file  Social History Narrative   MARRIED   1 SON 1 DAUGHTER 2 GRANDCHILDREN   Music therapist, NOW FULL TIME CAREGIVER   LIVES IN [place], GREW UP IN [place].   Social Determinants of Health   Financial Resource Strain: Low Risk    Difficulty of Paying Living Expenses: Not hard at all  Food Insecurity: No Food Insecurity   Worried About Charity fundraiser in the Last Year: Never true   [place] in the Last Year: Never true  Transportation Needs: No Transportation Needs   Lack of Transportation (Medical): No   Lack of Transportation (Non-Medical): No  Physical Activity: Sufficiently Active   Days of Exercise per Week: 5 days   Minutes of Exercise per Session: 30 min  Stress: No Stress Concern Present   Feeling of Stress : Not at all  Social Connections: Socially Integrated   Frequency of Communication with Friends and Family: More than three times a week   Frequency of Social Gatherings with Friends and Family: More than three times a week   Attends Religious Services: More than 4 times per year   Active Member of [employer] or Organizations: Yes   Attends Music therapist: More than 4 times per year   Marital Status: Married    Tobacco Counseling Counseling given: Not Answered Tobacco comments: discussed LDCT    Clinical Intake:  Pre-visit preparation completed: Yes  Pain : No/denies pain Pain Score: 0-No pain     BMI - recorded: 28.63 Nutritional Status: BMI 25 -29 Overweight Nutritional Risks: None Diabetes: No  How often do you need to have someone help you when you read instructions, pamphlets, or other written materials from your doctor or pharmacy?: 1 - Never What is the last grade level you completed in school?: [employer]  Diabetic? no  Interpreter Needed?: No  Information entered  by :: Lisette Abu, LPN   Activities of Daily Living    06/24/2021   12:48 PM  In your present state of health, do you have any difficulty performing the following activities:  Hearing? 0  Vision? 0  Difficulty concentrating or making decisions? 0  Walking or climbing stairs? 0  Dressing or bathing? 0  Doing errands, shopping? 0  Preparing Food and eating ? N  Using the Toilet? N  In the past six months, have you accidently leaked urine? N  Do you have problems with loss of bowel control? N  Managing your Medications? N  Managing your Finances? N  Housekeeping or managing your Housekeeping? N    Patient Care Team: Janith Lima, MD as PCP - General (Internal Medicine) Sherren Mocha, MD as PCP - Cardiology (Cardiology) Constance Haw, MD as PCP - Electrophysiology (Cardiology) Melrose Nakayama, MD (Cardiothoracic Surgery) Latanya Maudlin, MD as Consulting Physician (Orthopedic Surgery) Warren Danes, PA-C as Physician Assistant (Dermatology) [place], Benjaman Pott, OD (Optometry) Sharmon Revere as Physician Assistant (Cardiology)  Indicate any recent Medical Services you may have received from other than [facility] providers in the past year (date may be approximate).     Assessment:   This is a routine wellness examination for Marquetta.  Hearing/Vision screen Hearing Screening - Comments:: Patient denied any hearing difficulty.   No hearing aids.  Vision Screening - Comments:: Patient does wear corrective lenses/contacts.     Dietary issues and exercise activities discussed: Current Exercise Habits: Home exercise routine, Type of exercise: walking, Time (Minutes): 30, Frequency (Times/Week): 5, Weekly Exercise (Minutes/Week): 150, Intensity: Mild, Exercise limited by: cardiac condition(s)   Goals Addressed             This Visit's Progress    My goal is to stay alive and well.        Depression Screen    06/24/2021   12:46 PM 06/19/2020    11:37 AM 05/04/2019   10:38 AM 04/04/2018    9:29 AM 02/08/2017   11:06 AM 11/16/2015    5:32 PM 10/22/2015   12:44 PM  PHQ 2/9 Scores  PHQ - 2 Score 0 0 0 0 0 0 0  PHQ- 9 Score     1      Fall Risk    06/24/2021   12:48 PM 06/19/2020   11:38 AM 05/04/2019   10:37 AM 04/04/2018    9:29 AM 02/08/2017   11:06 AM  Fall Risk   Falls in the past year? 1 1 0 0 Yes  Number falls in past yr: 0 0 0 0 1  Injury with Fall? 0 1 0    Risk for fall due to : No Fall Risks  No Fall Risks    Follow up Falls evaluation completed Falls evaluation completed Falls evaluation completed;Education provided;Falls prevention discussed      FALL RISK PREVENTION PERTAINING TO THE HOME:  Any stairs in or around the home? No  If so, are there  any without handrails? No  Home free of loose throw rugs in walkways, pet beds, electrical cords, etc? Yes  Adequate lighting in your home to reduce risk of falls? Yes   ASSISTIVE DEVICES UTILIZED TO PREVENT FALLS:  Life alert? No  Use of a cane, walker or w/c? No  Grab bars in the bathroom? Yes  Shower chair or bench in shower? Yes  Elevated toilet seat or a handicapped toilet? Yes   TIMED UP AND GO:  Was the test performed? Yes .  Length of time to ambulate 10 feet: 7 sec.   Gait steady and fast without use of assistive device  Cognitive Function:    02/08/2017   11:10 AM 10/11/2014   12:48 PM  MMSE - Mini Mental State Exam  Not completed:  Unable to complete  Orientation to time 5   Orientation to Place 5   Registration 3   Attention/ Calculation 5   Recall 2   Language- name 2 objects 2   Language- repeat 1   Language- follow 3 step command 3   Language- read & follow direction 1   Write a sentence 1   Copy design 1   Total score 29         06/24/2021   12:48 PM 05/04/2019   10:40 AM  6CIT Screen  What Year? 0 points 0 points  What month? 0 points 0 points  What time? 0 points 0 points  Count back from 20 0 points 0 points  Months in reverse 0  points 0 points  Repeat phrase 0 points 0 points  Total Score 0 points 0 points    Immunizations Immunization History  Administered Date(s) Administered   Hep A / Hep B 10/17/2014, 04/16/2015   Hepatitis B, adult 11/16/2014   PFIZER(Purple Top)SARS-COV-2 Vaccination 02/14/2019, 03/07/2019   Pneumococcal Conjugate-13 08/16/2013   Pneumococcal Polysaccharide-23 08/12/2011, 09/12/2019   Td 02/21/2010   Tdap 02/21/2010, 06/24/2020    TDAP status: Up to date  Flu Vaccine status: Declined, Education has been provided regarding the importance of this vaccine but patient still declined. Advised may receive this vaccine at local pharmacy or Health Dept. Aware to provide a copy of the vaccination record if obtained from local pharmacy or Health Dept. Verbalized acceptance and understanding.  Pneumococcal vaccine status: Up to date  Covid-19 vaccine status: Completed vaccines  Qualifies for Shingles Vaccine? Yes   Zostavax completed Yes   Shingrix Completed?: No.    Education has been provided regarding the importance of this vaccine. Patient has been advised to call insurance company to determine out of pocket expense if they have not yet received this vaccine. Advised may also receive vaccine at local pharmacy or Health Dept. Verbalized acceptance and understanding.  Screening Tests Health Maintenance  Topic Date Due   Zoster Vaccines- Shingrix (1 of 2) Never done   COVID-19 Vaccine (3 - Pfizer risk series) 04/04/2019   COLONOSCOPY (Pts 45-59yr Insurance coverage will need to be confirmed)  04/29/2020   HEMOGLOBIN A1C  12/25/2020   OPHTHALMOLOGY EXAM  05/16/2021   FOOT EXAM  06/24/2021   INFLUENZA VACCINE  09/02/2021   TETANUS/TDAP  06/25/2030   Pneumonia Vaccine 76 Years old  Completed   DEXA SCAN  Completed   Hepatitis C Screening  Completed   HPV VACCINES  Aged Out    Health Maintenance  Health Maintenance Due  Topic Date Due   Zoster Vaccines- Shingrix (1 of 2) Never  done   COVID-19  Vaccine (3 - Pfizer risk series) 04/04/2019   COLONOSCOPY (Pts 45-41yr Insurance coverage will need to be confirmed)  04/29/2020   HEMOGLOBIN A1C  12/25/2020   OPHTHALMOLOGY EXAM  05/16/2021   FOOT EXAM  06/24/2021    Colorectal cancer screening: Type of screening: Colonoscopy. Completed 04/30/2015. Repeat every 5 years  Mammogram status: Completed 06/2021. Repeat every year  Bone Density status: Completed 02/27/2020. Results reflect: Bone density results: OSTEOPENIA. Repeat every 2-3 years.  Lung Cancer Screening: (Low Dose CT Chest recommended if Age 78-80years, 30 pack-year currently smoking OR have quit w/in 15years.) does not qualify.   Lung Cancer Screening Referral: no  Additional Screening:  Hepatitis C Screening: does qualify; Completed 10/17/2014  Vision Screening: Recommended annual ophthalmology exams for early detection of glaucoma and other disorders of the eye. Is the patient up to date with their annual eye exam?  Yes  Who is the provider or what is the name of the office in which the patient attends annual eye exams? ROrinda Kenner OD. If pt is not established with a provider, would they like to be referred to a provider to establish care? No .   Dental Screening: Recommended annual dental exams for proper oral hygiene  Community Resource Referral / Chronic Care Management: CRR required this visit?  No   CCM required this visit?  No      Plan:     I have personally reviewed and noted the following in the patient's chart:   Medical and social history Use of alcohol, tobacco or illicit drugs  Current medications and supplements including opioid prescriptions.  Functional ability and status Nutritional status Physical activity Advanced directives List of other physicians Hospitalizations, surgeries, and ER visits in previous 12 months Vitals Screenings to include cognitive, depression, and falls Referrals and appointments  In addition,  I have reviewed and discussed with patient certain preventive protocols, quality metrics, and best practice recommendations. A written personalized care plan for preventive services as well as general preventive health recommendations were provided to patient.     SSheral Flow LPN   [id number]  Nurse Notes:  Hearing Screening - Comments:: Patient denied any hearing difficulty.   No hearing aids.  Vision Screening - Comments:: Patient does wear corrective lenses/contacts.

## 2021-06-26 ENCOUNTER — Encounter: Payer: Self-pay | Admitting: Internal Medicine

## 2021-06-26 DIAGNOSIS — R921 Mammographic calcification found on diagnostic imaging of breast: Secondary | ICD-10-CM | POA: Diagnosis not present

## 2021-06-26 LAB — HM MAMMOGRAPHY

## 2021-07-02 ENCOUNTER — Ambulatory Visit (INDEPENDENT_AMBULATORY_CARE_PROVIDER_SITE_OTHER): Payer: Medicare HMO | Admitting: Internal Medicine

## 2021-07-02 ENCOUNTER — Encounter: Payer: Self-pay | Admitting: Internal Medicine

## 2021-07-02 VITALS — BP 140/72 | HR 61 | Temp 98.2°F | Ht 63.0 in | Wt 162.0 lb

## 2021-07-02 DIAGNOSIS — E1122 Type 2 diabetes mellitus with diabetic chronic kidney disease: Secondary | ICD-10-CM | POA: Diagnosis not present

## 2021-07-02 DIAGNOSIS — I10 Essential (primary) hypertension: Secondary | ICD-10-CM

## 2021-07-02 DIAGNOSIS — E785 Hyperlipidemia, unspecified: Secondary | ICD-10-CM

## 2021-07-02 DIAGNOSIS — J301 Allergic rhinitis due to pollen: Secondary | ICD-10-CM

## 2021-07-02 DIAGNOSIS — E781 Pure hyperglyceridemia: Secondary | ICD-10-CM | POA: Diagnosis not present

## 2021-07-02 DIAGNOSIS — N1831 Chronic kidney disease, stage 3a: Secondary | ICD-10-CM

## 2021-07-02 DIAGNOSIS — Z0001 Encounter for general adult medical examination with abnormal findings: Secondary | ICD-10-CM | POA: Diagnosis not present

## 2021-07-02 DIAGNOSIS — E118 Type 2 diabetes mellitus with unspecified complications: Secondary | ICD-10-CM | POA: Diagnosis not present

## 2021-07-02 LAB — BASIC METABOLIC PANEL
BUN: 20 mg/dL (ref 6–23)
CO2: 29 mEq/L (ref 19–32)
Calcium: 9.4 mg/dL (ref 8.4–10.5)
Chloride: 102 mEq/L (ref 96–112)
Creatinine, Ser: 0.93 mg/dL (ref 0.40–1.20)
GFR: 59.11 mL/min — ABNORMAL LOW (ref >60.00)
Glucose, Bld: 105 mg/dL — ABNORMAL HIGH (ref 70–99)
Potassium: 4.3 mEq/L (ref 3.5–5.1)
Sodium: 139 mEq/L (ref 135–145)

## 2021-07-02 LAB — LIPID PANEL
Cholesterol: 191 mg/dL (ref 0–200)
HDL: 61.2 mg/dL (ref >39.00)
LDL Cholesterol: 100 mg/dL — ABNORMAL HIGH (ref 0–99)
NonHDL: 130
Total CHOL/HDL Ratio: 3
Triglycerides: 151 mg/dL — ABNORMAL HIGH (ref 0.0–149.0)
VLDL: 30.2 mg/dL (ref 0.0–40.0)

## 2021-07-02 LAB — MICROALBUMIN / CREATININE URINE RATIO
Creatinine,U: 68.3 mg/dL
Microalb Creat Ratio: 1.7 mg/g (ref 0.0–30.0)
Microalb, Ur: 1.1 mg/dL (ref 0.0–1.9)

## 2021-07-02 LAB — URINALYSIS, ROUTINE W REFLEX MICROSCOPIC
Bilirubin Urine: NEGATIVE
Hgb urine dipstick: NEGATIVE
Ketones, ur: NEGATIVE
Nitrite: NEGATIVE
RBC / HPF: NONE SEEN (ref 0–?)
Specific Gravity, Urine: 1.01 (ref 1.000–1.030)
Total Protein, Urine: NEGATIVE
Urine Glucose: NEGATIVE
Urobilinogen, UA: 0.2 (ref 0.0–1.0)
pH: 6 (ref 5.0–8.0)

## 2021-07-02 LAB — TSH: TSH: 5.37 u[IU]/mL (ref 0.35–5.50)

## 2021-07-02 LAB — HEMOGLOBIN A1C: Hgb A1c MFr Bld: 7.1 % — ABNORMAL HIGH (ref 4.6–6.5)

## 2021-07-02 MED ORDER — MONTELUKAST SODIUM 10 MG PO TABS: 10.0000 mg | ORAL_TABLET | Freq: Every day | ORAL | 1 refills | Status: DC

## 2021-07-02 NOTE — Patient Instructions (Signed)

## 2021-07-02 NOTE — Progress Notes (Unsigned)
Subjective:  Patient ID: Heather Crawford, female    DOB: 06/30/1945  Age: 76 y.o. MRN: [id number]  CC: Annual Exam, Hypertension, Hyperlipidemia, Diabetes, and Allergic Rhinitis    HPI Heather Crawford presents for a CPX and f/up -   She continues to complain of sinus symptoms with popping in her ears, postnasal drip, runny nose productive of clear phlegm, and sneezing.  She is active and denies chest pain, shortness of breath, diaphoresis, or edema.  Outpatient Medications Prior to Visit  Medication Sig Dispense Refill   acetaminophen (TYLENOL) 325 MG tablet Take 1-2 tablets (325-650 mg total) by mouth every 4 (four) hours as needed for mild pain.     albuterol (VENTOLIN HFA) 108 (90 Base) MCG/ACT inhaler Inhale 2 puffs into the lungs every 6 (six) hours as needed (Congestion).     amLODipine (NORVASC) 2.5 MG tablet Take 1 tablet (2.5 mg total) by mouth daily. 90 tablet 1   aspirin EC 81 MG tablet Take 1 tablet (81 mg total) by mouth daily. Swallow whole. (Patient taking differently: Take 81 mg by mouth at bedtime. Swallow whole.) 90 tablet 3   Cholecalciferol 2000 units TABS Take 1 tablet (2,000 Units total) by mouth daily. 90 tablet 1   fluticasone (FLONASE) 50 MCG/ACT nasal spray USE 1 SPRAY IN EACH NOSTRIL EVERY DAY 32 g 11   furosemide (LASIX) 20 MG tablet TAKE 1 TABLET DAILY AS NEEDED FOR EDEMA OR WEIGHT GAIN. TAKE POTASSIUM 10MEQ WHEN YOU TAKE LASIX 90 tablet 0   irbesartan (AVAPRO) 150 MG tablet TAKE 1/2 TABLET IN THE MORNING  AND TAKE 1/2 TABLET AT BEDTIME 90 tablet 0   isosorbide mononitrate (IMDUR) 30 MG 24 hr tablet TAKE 1 TABLET EVERY DAY 90 tablet 2   meclizine (ANTIVERT) 12.5 MG tablet Take 1 tablet (12.5 mg total) by mouth 3 (three) times daily as needed for dizziness. 90 tablet 3   Omega-3 Fatty Acids (FISH OIL) 1000 MG CAPS Take 1,000 mg by mouth daily.     omeprazole (PRILOSEC) 40 MG capsule TAKE 1 CAPSULE (40 MG TOTAL) BY MOUTH DAILY. (Patient taking differently:  40 mg daily.) 90 capsule 1   simvastatin (ZOCOR) 40 MG tablet Take 20 mg by mouth daily.     nitroGLYCERIN (NITROSTAT) 0.4 MG SL tablet Place 1 tablet (0.4 mg total) under the tongue every 5 (five) minutes as needed for chest pain. 25 tablet 3   potassium chloride (KLOR-CON) 10 MEQ tablet Take 1 tablet (10 mEq total) by mouth as needed. Take with lasix only. 90 tablet 3   No facility-administered medications prior to visit.    ROS Review of Systems  Constitutional: Negative.   HENT:  Positive for postnasal drip and rhinorrhea. Negative for congestion, facial swelling, nosebleeds, sinus pressure, sinus pain, sore throat and voice change.   Eyes: Negative.   Respiratory:  Negative for cough, chest tightness, shortness of breath and wheezing.   Cardiovascular:  Negative for chest pain, palpitations and leg swelling.  Gastrointestinal:  Negative for abdominal pain, constipation, diarrhea, nausea and vomiting.  Endocrine: Negative.   Genitourinary: Negative.  Negative for difficulty urinating.  Musculoskeletal: Negative.   Skin: Negative.   Neurological:  Negative for dizziness, weakness and headaches.  Hematological:  Negative for adenopathy. Does not bruise/bleed easily.  Psychiatric/Behavioral: Negative.     Objective:  BP 140/72 (BP Location: Right Arm, Patient Position: Sitting, Cuff Size: Large)   Pulse 61   Temp 98.2 F (36.8 C) (Oral)  Ht '5\' 3"'$  (1.6 m)   Wt 162 lb (73.5 kg)   SpO2 96%   BMI 28.70 kg/m   BP Readings from Last 3 Encounters:  07/02/21 140/72  06/24/21 110/60  04/22/21 (!) 122/50    Wt Readings from Last 3 Encounters:  07/02/21 162 lb (73.5 kg)  06/24/21 161 lb 9.6 oz (73.3 kg)  04/22/21 165 lb (74.8 kg)    Physical Exam Vitals reviewed.  HENT:     Right Ear: Hearing, tympanic membrane, ear canal and external ear normal.     Left Ear: Hearing, tympanic membrane, ear canal and external ear normal.     Nose: Nose normal.     Mouth/Throat:      Mouth: Mucous membranes are moist.  Eyes:     General: No scleral icterus.    Conjunctiva/sclera: Conjunctivae normal.  Cardiovascular:     Rate and Rhythm: Normal rate and regular rhythm.     Heart sounds: Murmur heard.  Systolic murmur is present with a grade of 2/6.  No diastolic murmur is present.    No gallop.     Comments: 2/6 SEM RUSB 1/6 SEM LLSB Pulmonary:     Effort: Pulmonary effort is normal.     Breath sounds: No stridor. No wheezing, rhonchi or rales.  Abdominal:     General: Abdomen is flat.     Palpations: There is no mass.     Tenderness: There is no abdominal tenderness. There is no guarding.     Hernia: No hernia is present.  Musculoskeletal:     Cervical back: Neck supple.     Right lower leg: No edema.     Left lower leg: No edema.  Skin:    General: Skin is warm and dry.     Findings: No rash.  Neurological:     General: No focal deficit present.     Mental Status: She is alert. Mental status is at baseline.  Psychiatric:        Mood and Affect: Mood normal.        Behavior: Behavior normal.    Lab Results  Component Value Date   WBC 6.3 04/22/2021   HGB 11.5 04/22/2021   HCT 34.6 04/22/2021   PLT 237 04/22/2021   GLUCOSE 105 (H) 07/02/2021   CHOL 191 07/02/2021   TRIG 151.0 (H) 07/02/2021   HDL 61.20 07/02/2021   LDLDIRECT 76.0 06/24/2020   LDLCALC 100 (H) 07/02/2021   ALT 12 06/24/2020   AST 16 06/24/2020   NA 139 07/02/2021   K 4.3 07/02/2021   CL 102 07/02/2021   CREATININE 0.93 07/02/2021   BUN 20 07/02/2021   CO2 29 07/02/2021   TSH 5.37 07/02/2021   INR 3.1 06/18/2010   HGBA1C 7.1 (H) 07/02/2021   MICROALBUR 1.1 07/02/2021    CARDIAC CATHETERIZATION  Result Date: 04/07/2021 Conclusions: Severe single-vessel coronary artery disease with chronic total occlusion of the mid RCA and 90% stenosis of the distal RCA (lesion is proximal to the RIMA anastomosis).  There is mild-moderate, nonobstructive disease involving the left  coronary artery with up to 40% stenosis in the mid LAD. Widely patent RIMA-distal RCA. Low normal left and right heart filling pressures. Moderate stenosis of bioprosthetic aortic valve (mean gradient 30 mmHg, AVA 1.2 cm). Normal Fick cardiac output/index. Recommendations: Continue medical therapy of chronic HFpEF and coronary artery disease with stable angina. Ongoing surveillance of bioprosthetic aortic valve with moderately elevated gradient.  I do not believe this alone  explains the patient's exertional dyspnea. Secondary prevention of coronary artery disease. Nelva Bush, MD [facility]   Assessment & Plan:   Heather Crawford was seen today for annual exam, hypertension, hyperlipidemia, diabetes and allergic rhinitis .  Diagnoses and all orders for this visit:  Type II diabetes mellitus with manifestations ([place])- Her A1c is up to 7.1% and she has renal insufficiency.  I recommended that she start taking Farxiga for renal protection. -     Basic metabolic panel; Future -     Hemoglobin A1c; Future -     Microalbumin / creatinine urine ratio; Future -     Urinalysis, Routine w reflex microscopic; Future -     HM Diabetes Foot Exam -     Ambulatory referral to Ophthalmology -     Urinalysis, Routine w reflex microscopic -     Microalbumin / creatinine urine ratio -     Hemoglobin A1c -     Basic metabolic panel -     dapagliflozin propanediol (FARXIGA) 10 MG TABS tablet; Take 1 tablet (10 mg total) by mouth daily before breakfast.  Hyperlipidemia LDL goal <70- LDL goal achieved. Doing well on the statin  -     Lipid panel; Future -     TSH; Future -     TSH -     Lipid panel  Hypertriglyceridemia -     Lipid panel; Future -     Lipid panel  Essential hypertension- Her blood pressure is adequately well controlled. -     Urinalysis, Routine w reflex microscopic; Future -     Urinalysis, Routine w reflex microscopic  Encounter for general adult medical examination with abnormal  findings- Exam completed, labs reviewed, vaccines reviewed and updated, cancer screenings are up-to-date, patient education was given.  Seasonal allergic rhinitis due to pollen -     montelukast (SINGULAIR) 10 MG tablet; Take 1 tablet (10 mg total) by mouth at bedtime.  Stage 3a chronic kidney disease (HCC) -     dapagliflozin propanediol (FARXIGA) 10 MG TABS tablet; Take 1 tablet (10 mg total) by mouth daily before breakfast.   I am having Heather Natal. Glick start on montelukast and dapagliflozin propanediol. I am also having her maintain her Cholecalciferol, meclizine, Fish Oil, acetaminophen, aspirin EC, fluticasone, omeprazole, isosorbide mononitrate, furosemide, albuterol, nitroGLYCERIN, potassium chloride, simvastatin, amLODipine, and irbesartan.  Meds ordered this encounter  Medications   montelukast (SINGULAIR) 10 MG tablet    Sig: Take 1 tablet (10 mg total) by mouth at bedtime.    Dispense:  90 tablet    Refill:  1   dapagliflozin propanediol (FARXIGA) 10 MG TABS tablet    Sig: Take 1 tablet (10 mg total) by mouth daily before breakfast.    Dispense:  100 tablet    Refill:  1     Follow-up: Return in about 6 months (around 01/01/2022).  Scarlette Calico, MD

## 2021-07-03 DIAGNOSIS — N1831 Chronic kidney disease, stage 3a: Secondary | ICD-10-CM | POA: Insufficient documentation

## 2021-07-03 MED ORDER — DAPAGLIFLOZIN PROPANEDIOL 10 MG PO TABS: 10.0000 mg | ORAL_TABLET | Freq: Every day | ORAL | 1 refills | Status: DC

## 2021-07-09 ENCOUNTER — Other Ambulatory Visit: Payer: Self-pay

## 2021-07-09 DIAGNOSIS — R921 Mammographic calcification found on diagnostic imaging of breast: Secondary | ICD-10-CM | POA: Diagnosis not present

## 2021-07-09 DIAGNOSIS — N6012 Diffuse cystic mastopathy of left breast: Secondary | ICD-10-CM | POA: Diagnosis not present

## 2021-07-17 ENCOUNTER — Ambulatory Visit (INDEPENDENT_AMBULATORY_CARE_PROVIDER_SITE_OTHER): Payer: Medicare HMO

## 2021-07-17 DIAGNOSIS — I442 Atrioventricular block, complete: Secondary | ICD-10-CM

## 2021-07-17 LAB — CUP PACEART REMOTE DEVICE CHECK
Battery Remaining Longevity: 165 mo
Battery Voltage: 3.06 V
Brady Statistic AP VP Percent: 0.02 %
Brady Statistic AP VS Percent: 19.37 %
Brady Statistic AS VP Percent: 0.03 %
Brady Statistic AS VS Percent: 80.57 %
Brady Statistic RA Percent Paced: 19.41 %
Brady Statistic RV Percent Paced: 0.05 %
Date Time Interrogation Session: 20230614203508
Implantable Lead Implant Date: 20211215
Implantable Lead Implant Date: 20211215
Implantable Lead Location: 753859
Implantable Lead Location: 753860
Implantable Lead Model: 5076
Implantable Lead Model: 5076
Implantable Pulse Generator Implant Date: 20211215
Lead Channel Impedance Value: 304 Ohm
Lead Channel Impedance Value: 399 Ohm
Lead Channel Impedance Value: 665 Ohm
Lead Channel Impedance Value: 703 Ohm
Lead Channel Pacing Threshold Amplitude: 0.5 V
Lead Channel Pacing Threshold Amplitude: 0.5 V
Lead Channel Pacing Threshold Pulse Width: 0.4 ms
Lead Channel Pacing Threshold Pulse Width: 0.4 ms
Lead Channel Sensing Intrinsic Amplitude: 1.875 mV
Lead Channel Sensing Intrinsic Amplitude: 1.875 mV
Lead Channel Sensing Intrinsic Amplitude: 14.875 mV
Lead Channel Sensing Intrinsic Amplitude: 14.875 mV
Lead Channel Setting Pacing Amplitude: 1.5 V
Lead Channel Setting Pacing Amplitude: 2 V
Lead Channel Setting Pacing Pulse Width: 0.4 ms
Lead Channel Setting Sensing Sensitivity: 1.2 mV

## 2021-07-25 ENCOUNTER — Other Ambulatory Visit: Payer: Medicare HMO

## 2021-07-25 ENCOUNTER — Ambulatory Visit: Payer: Medicare HMO

## 2021-07-25 DIAGNOSIS — I25119 Atherosclerotic heart disease of native coronary artery with unspecified angina pectoris: Secondary | ICD-10-CM | POA: Diagnosis not present

## 2021-07-25 DIAGNOSIS — T82857D Stenosis of cardiac prosthetic devices, implants and grafts, subsequent encounter: Secondary | ICD-10-CM | POA: Diagnosis not present

## 2021-07-25 DIAGNOSIS — I10 Essential (primary) hypertension: Secondary | ICD-10-CM

## 2021-07-25 DIAGNOSIS — E785 Hyperlipidemia, unspecified: Secondary | ICD-10-CM

## 2021-07-25 DIAGNOSIS — I38 Endocarditis, valve unspecified: Secondary | ICD-10-CM

## 2021-07-25 DIAGNOSIS — I5032 Chronic diastolic (congestive) heart failure: Secondary | ICD-10-CM

## 2021-07-25 DIAGNOSIS — R0602 Shortness of breath: Secondary | ICD-10-CM

## 2021-07-25 LAB — LIPID PANEL
Chol/HDL Ratio: 4 ratio (ref 0.0–4.4)
Cholesterol, Total: 203 mg/dL — ABNORMAL HIGH (ref 100–199)
HDL: 51 mg/dL (ref >39)
LDL Chol Calc (NIH): 122 mg/dL — ABNORMAL HIGH (ref 0–99)
Triglycerides: 168 mg/dL — ABNORMAL HIGH (ref 0–149)
VLDL Cholesterol Cal: 30 mg/dL (ref 5–40)

## 2021-07-25 LAB — HEPATIC FUNCTION PANEL
ALT: 15 IU/L (ref 0–32)
AST: 14 IU/L (ref 0–40)
Albumin: 4.3 g/dL (ref 3.7–4.7)
Alkaline Phosphatase: 53 IU/L (ref 44–121)
Bilirubin Total: 0.4 mg/dL (ref 0.0–1.2)
Bilirubin, Direct: 0.1 mg/dL (ref 0.00–0.40)
Total Protein: 6.4 g/dL (ref 6.0–8.5)

## 2021-07-28 ENCOUNTER — Telehealth: Payer: Self-pay | Admitting: *Deleted

## 2021-07-28 ENCOUNTER — Other Ambulatory Visit: Payer: Self-pay | Admitting: Internal Medicine

## 2021-07-28 DIAGNOSIS — E785 Hyperlipidemia, unspecified: Secondary | ICD-10-CM

## 2021-08-09 IMAGING — CR DG CHEST 2V
2 series · 2 of 2 positions shown · non-contrast
Comparison: September 12, 2018.

CLINICAL DATA: Cardiac device in situ.

EXAM:
CHEST - 2 VIEW

[chest lat]
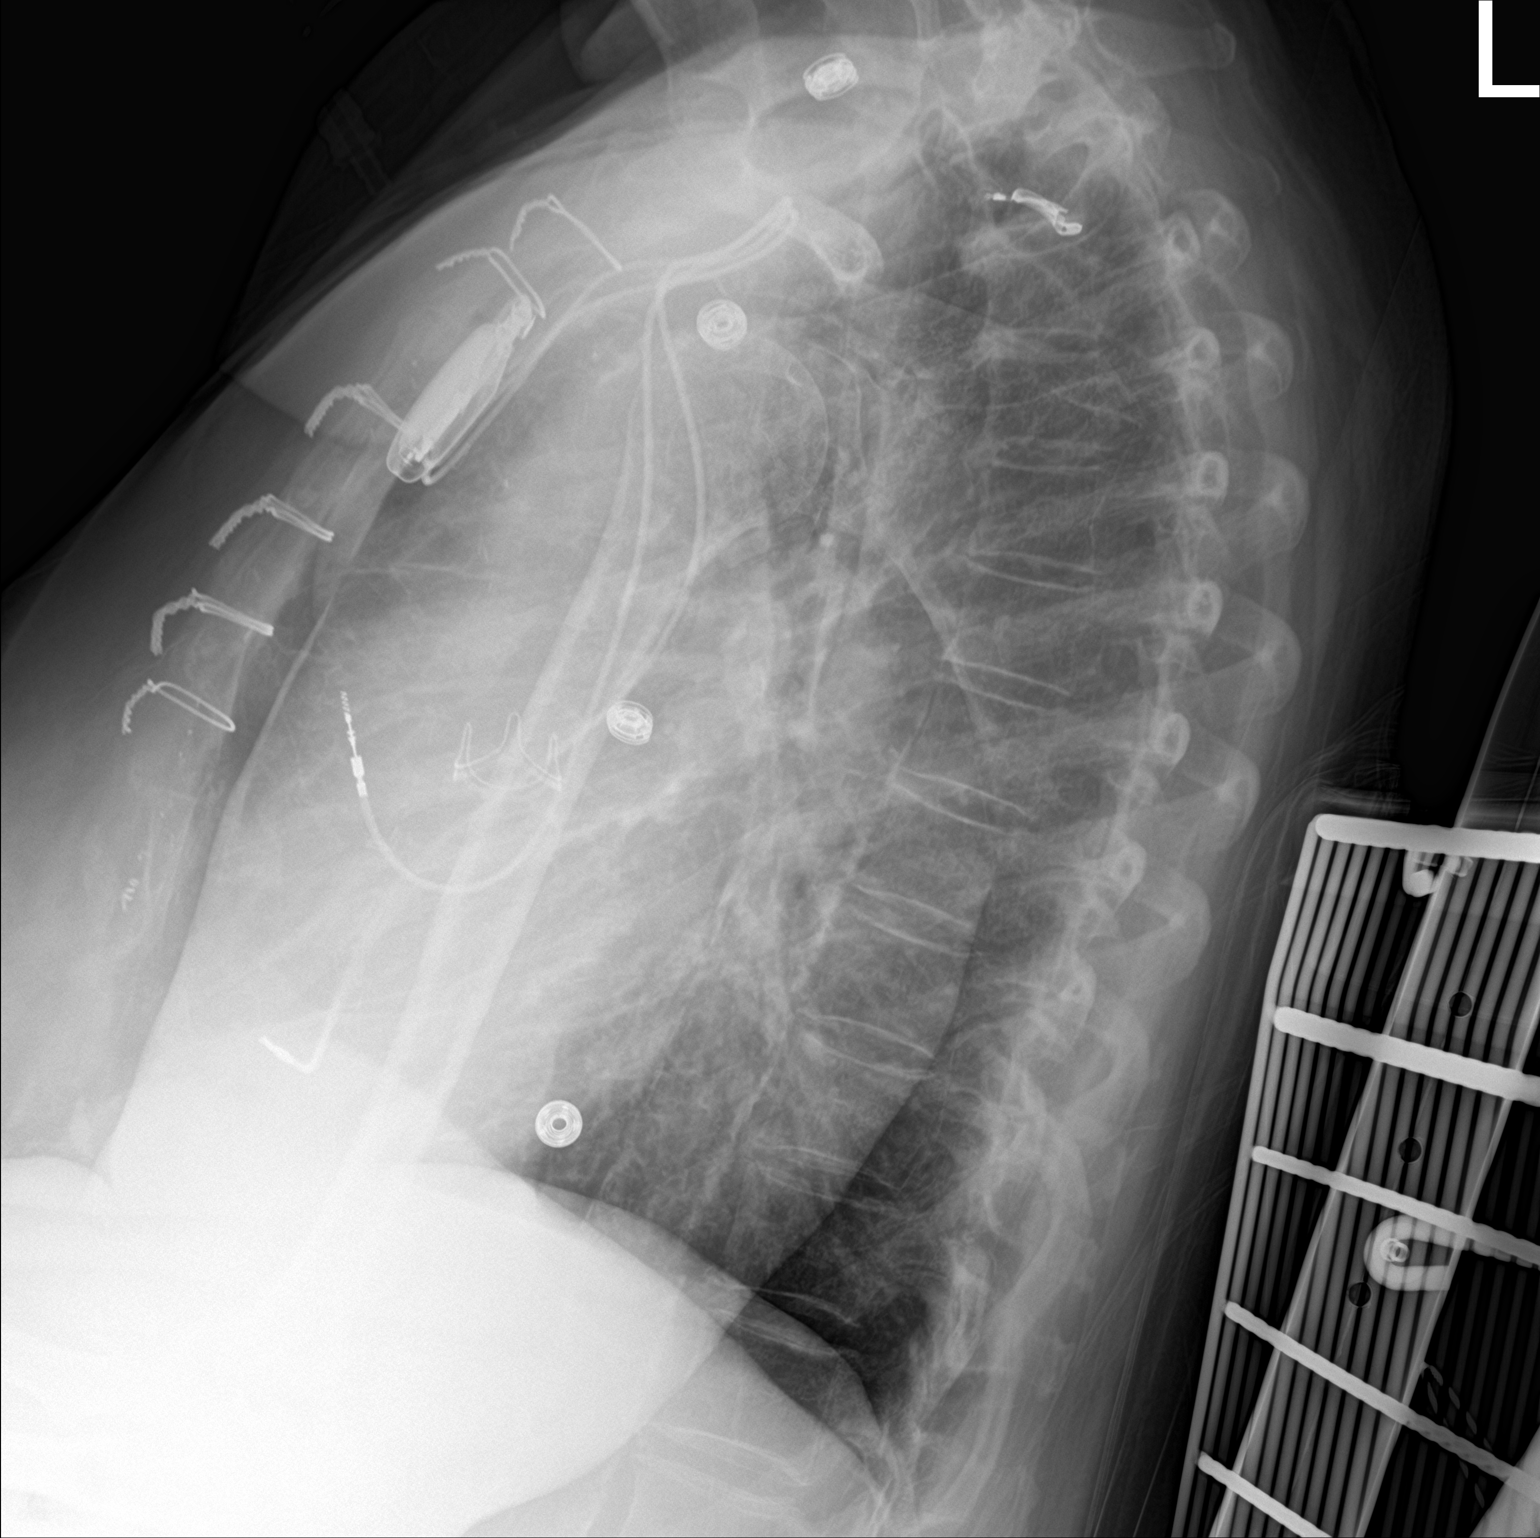

[chest ap]
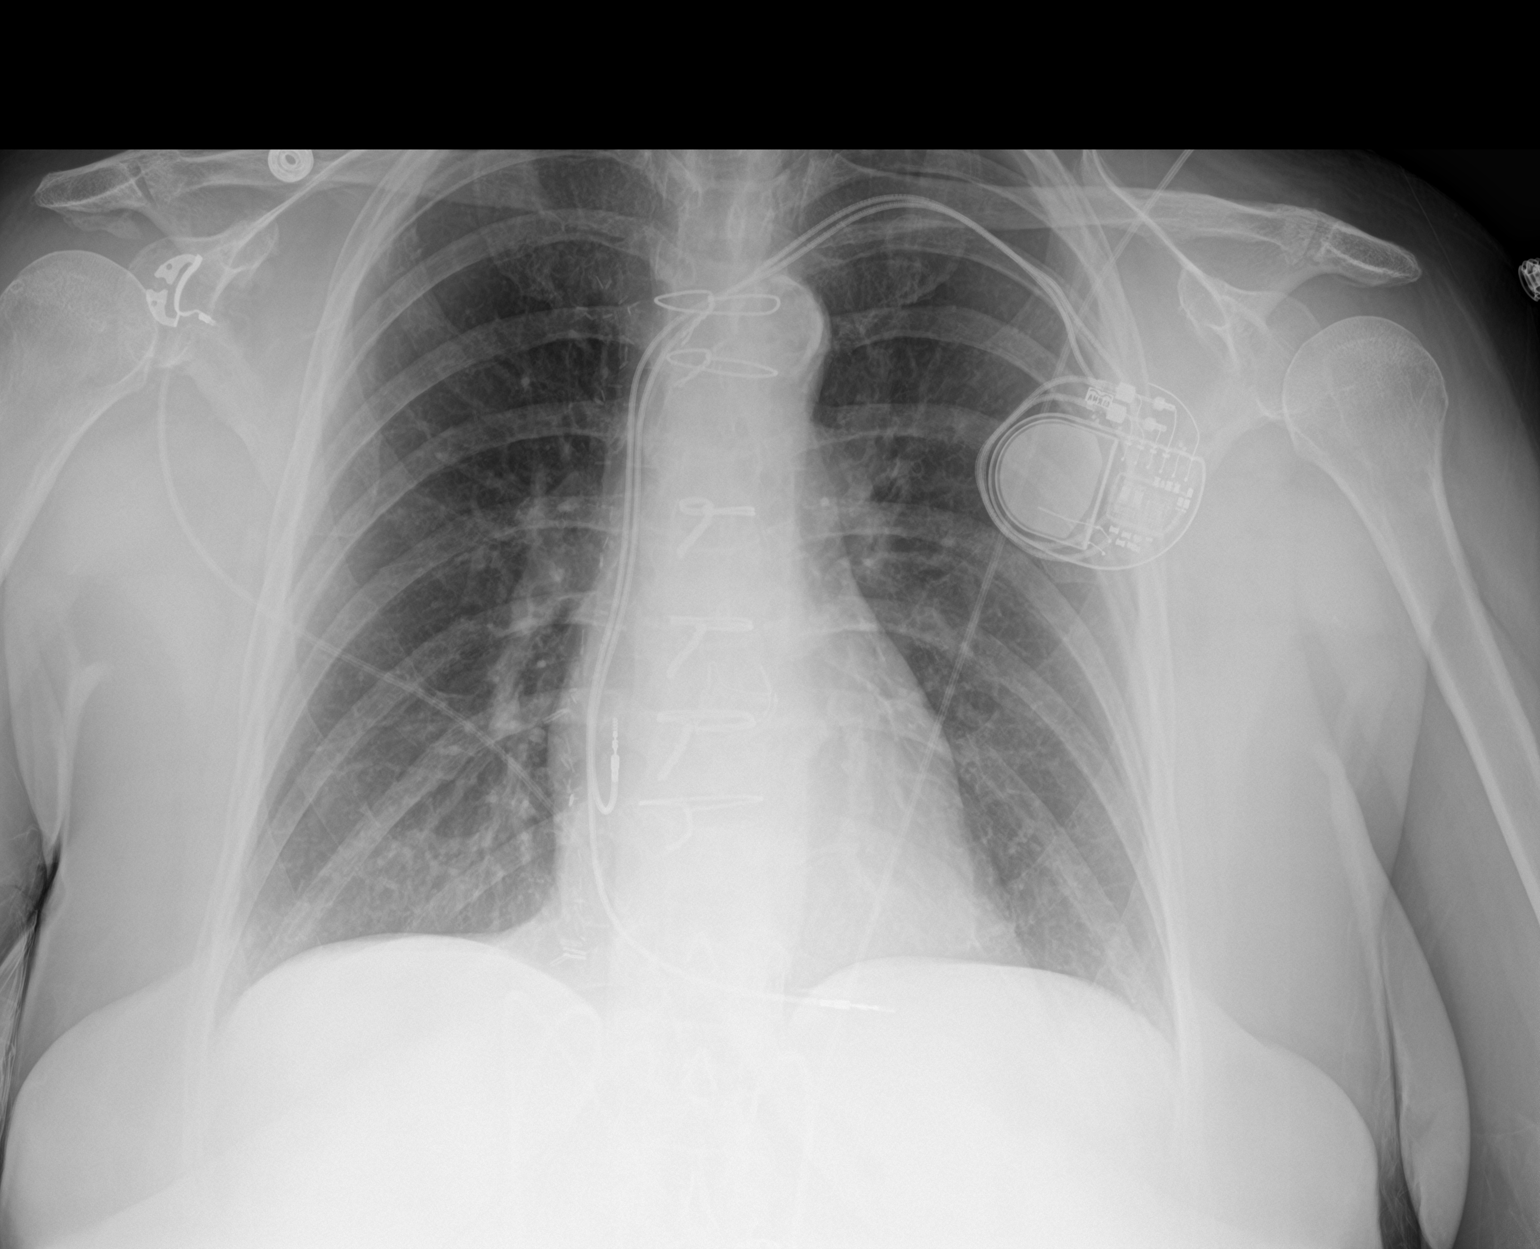

[2 of 2 positions shown; findings below may reference images not displayed]

FINDINGS: The heart size and mediastinal contours are within normal limits. No
pneumothorax or pleural effusion is noted. Interval placement of
left-sided pacemaker with leads in grossly good position. Status
post aortic valve repair. Both lungs are clear. The visualized
skeletal structures are unremarkable.
IMPRESSION: No active cardiopulmonary disease.

Aortic Atherosclerosis ([id number]).

## 2021-08-13 ENCOUNTER — Other Ambulatory Visit: Payer: Self-pay | Admitting: Cardiology

## 2021-08-13 DIAGNOSIS — Z952 Presence of prosthetic heart valve: Secondary | ICD-10-CM

## 2021-08-21 ENCOUNTER — Ambulatory Visit: Payer: Medicare HMO | Admitting: Pharmacist

## 2021-08-21 DIAGNOSIS — E782 Mixed hyperlipidemia: Secondary | ICD-10-CM

## 2021-08-21 DIAGNOSIS — E785 Hyperlipidemia, unspecified: Secondary | ICD-10-CM

## 2021-08-21 MED ORDER — EZETIMIBE 10 MG PO TABS: 10.0000 mg | ORAL_TABLET | Freq: Every day | ORAL | 3 refills | Status: DC

## 2021-08-21 MED ORDER — SIMVASTATIN 40 MG PO TABS: 40.0000 mg | ORAL_TABLET | Freq: Every day | ORAL | 3 refills | Status: DC

## 2021-08-21 NOTE — Progress Notes (Signed)
Patient ID: Heather Crawford                 DOB: 09/12/1945                    MRN: [id number]     HPI: Heather Crawford is a 76 y.o. female patient of Dr Burt Knack referred to lipid clinic by Heather Dopp, PA. PMH is significant for valvular heart disease and rheumatic fever as a child, s/p bioprosthetic AVR in 2012, CAD s/p CABG in 2012, HFpEF, DM, bilateral ICA stenosis 1-39%, LBBB, and GERD.  Pt presents today for follow up. Experienced bilateral thigh pain on rosuvastatin and atorvastatin. Had been tolerating simvastatin well, initially decreased her dose from '40mg'$  to '20mg'$  because she was having knee pain. Didn't notice difference in her symptoms, stopped taking simvastatin completely a few months ago, still hasn't noticed any difference in symptoms. Thinks she may have arthritis in her knees.  Never started on Farxiga due to the cost. Reports not taking Singulair or fish oil either, med list updated.  Current Medications: none Intolerances: rosuvastatin '5mg'$  daily, atorvastatin '40mg'$  daily - myalgias Risk Factors: CAD s/p CABG, DM, HF, age LDL goal: '55mg'$ /dL  Diet: likes vegetables, bread.  Exercise: gardening. Has trouble with her knees  Family History: Breast cancer in her maternal aunt, mother, paternal grandmother, and sister; CAD in her father; Colon cancer in her maternal aunt and paternal uncle; Diabetes type II in her mother; Heart attack in her father; Heart disease in her mother; Hypertension in her mother; Hypothyroidism in her mother; Pancreatic cancer in an other family member.   Social History: Former tobacco use 1 PPD for 40 years, quit in 2002.  Labs: 07/25/21: TC 203, TG 168, HDL 51, LDL 122 (no LLT)  Past Medical History:  Diagnosis Date   Allergic rhinitis, cause unspecified    Aortic stenosis    a.  echo 1/12 w/mildly dilated LV, EF 45-50% w/paradoxical septal motion consistent w/ LBBB, moderate aortic stenosis w/mean gradient 27 mmHg, trivial AI, mild to  moderate MR w/calcified mitral valve;   b. s/p AVR with 21 mm Magna Ease valve   Aortic valve replaced 2012   Arthritis    joint pain   CAD (coronary artery disease)    a. s/p CABG 4/12: RIMA-RCA   CAD, multiple vessel    sees Dr Aundra Dubin every 3-4 months   Cancer [facility])    skin sarcomas removed   Carotid artery disease ([place])    Carotid US 10/21: Bilateral ICA 1-39; left subclavian stenosis   CHF (congestive heart failure) (HCC)    chronic diastolic CHF   Chronic back pain    H/O BACK SURGERY   Coronary artery disease    adenosine myoview 2/12 w/EF 56%, ISCHEMIA W/SCAR IN THE MID TO APICAL ANTERIO WALL, SEPTAL WALL, AND APEX. LHC 3/12 W/EF 55%, 40% OSTIAL RCA, 60% MID RCA, 95% DISTAL RC A, INTERVENTION  ATTEMPTED BUT UNABLE TO ADEQUADATELY SEAT  CATHETER.   Diverticulitis of colon (without mention of hemorrhage)(562.11)    Diverticulosis    Esophageal reflux    Glucose intolerance (impaired glucose tolerance)    A1C 6.3 (4/12)   H/O hiatal hernia    H/O vertigo    with fluid  in ears   Heart murmur    Hemorrhoids    Hemorrhoids    History of rheumatic fever    Hx of hysterectomy    Hx: UTI (urinary tract infection)  Hyperlipidemia    LBBB (left bundle branch block)    Myocardial infarction ([place]) 2002   Pneumonia    PONV (postoperative nausea and vomiting)    PPD positive    in the past   Snoring    Squamous cell carcinoma of skin    nose   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     albuterol (VENTOLIN HFA) 108 (90 Base) MCG/ACT inhaler Inhale 2 puffs into the lungs every 6 (six) hours as needed (Congestion).     amLODipine (NORVASC) 2.5 MG tablet Take 1 tablet (2.5 mg total) by mouth daily. 90 tablet 1   aspirin EC 81 MG tablet Take 1 tablet (81 mg total) by mouth daily. Swallow whole. (Patient taking differently: Take 81 mg by mouth at bedtime. Swallow whole.) 90 tablet 3   Cholecalciferol 2000 units TABS Take 1 tablet (2,000 Units total) by mouth daily. 90 tablet 1   dapagliflozin propanediol (FARXIGA) 10 MG TABS tablet Take 1 tablet (10 mg total) by mouth daily before breakfast. 100 tablet 1   fluticasone (FLONASE) 50 MCG/ACT nasal spray USE 1 SPRAY IN EACH NOSTRIL EVERY DAY 32 g 11   furosemide (LASIX) 20 MG tablet TAKE 1 TABLET DAILY AS NEEDED FOR EDEMA OR WEIGHT GAIN. TAKE WITH POTASSIUM 90 tablet 2   irbesartan (AVAPRO) 150 MG tablet TAKE 1/2 TABLET IN THE MORNING  AND TAKE 1/2 TABLET AT BEDTIME 90 tablet 0   isosorbide mononitrate (IMDUR) 30 MG 24 hr tablet TAKE 1 TABLET EVERY DAY 90 tablet 2   meclizine (ANTIVERT) 12.5 MG tablet Take 1 tablet (12.5 mg total) by mouth 3 (three) times daily as needed for dizziness. 90 tablet 3   montelukast (SINGULAIR) 10 MG tablet Take 1 tablet (10 mg total) by mouth at bedtime. 90 tablet 1   nitroGLYCERIN (NITROSTAT) 0.4 MG SL tablet Place 1 tablet (0.4 mg total) under the tongue every 5 (five) minutes as needed for chest pain. 25 tablet 3   Omega-3 Fatty Acids (FISH OIL) 1000 MG CAPS Take 1,000 mg by mouth daily.     omeprazole (PRILOSEC) 40 MG capsule TAKE 1 CAPSULE EVERY DAY 90 capsule 1   potassium chloride (KLOR-CON) 10 MEQ tablet Take 1 tablet (10 mEq total) by mouth as needed. Take with lasix only. 90 tablet 3   simvastatin (ZOCOR) 40 MG tablet Take 20 mg by mouth daily.     No current facility-administered medications on file prior to visit.    Allergies  Allergen Reactions   Crestor  [Rosuvastatin] Other (See Comments)    myalgia   Lipitor [Atorvastatin] Other (See Comments)    myalgia   Sulfamethoxazole-Trimethoprim Hives   Tape Hives    Breakouts from the adhesive in paper tape and patches   Amoxicillin Itching    Did it involve swelling of the face/tongue/throat, SOB, or low BP? No Did it involve sudden or severe rash/hives,  skin peeling, or any reaction on the inside of your mouth or nose? No Did you need to seek medical attention at a hospital or doctor's office? No When did it last happen?   per pt long time ago    If all above answers are "NO", may proceed with cephalosporin use.    Neomycin Itching    Topical neomycin    Neosporin [Neomycin-Bacitracin Zn-Polymyx] Other (See Comments)    Caused blistered   Polysporin [Bacitracin-Polymyxin B] Rash    Assessment/Plan:  1. Hyperlipidemia - Baseline LDL 122 above goal < 55 due to ASCVD + DM. Pt intolerant to atorvastatin and rosuvastatin but previously tolerated simvastatin well. Will restart simvastatin at '40mg'$  daily, pt aware to cut tablets in half if she notices any side effects. If tolerating well, pt advised to start ezetimibe '10mg'$  daily 2 weeks later. Noted drug interaction with amlodipine and simvastatin - her dose of amlodipine is low and she previously reports tolerating simvastatin '40mg'$  daily well, but has been given instructions to decrease dose if needed for tolerability. Branded meds cost prohibitive. Recheck fasting labs at Sept appt with Nicki Reaper.  Svetlana Bagby E. Atlas Kuc, PharmD, BCACP, [place] [address] [place], [place] [address] Phone: [phone]; Fax: [phone] 08/21/2021 11:35 AM

## 2021-08-21 NOTE — Patient Instructions (Addendum)
Your LDL cholesterol is 122 and your goal is < 55  Restart your simvastatin at '40mg'$  once daily. If you notice joint or muscle pain, start cutting your tablets in half again and take '20mg'$  daily  If you're doing well on the simvastatin, start ezetimibe '10mg'$  daily as well a few weeks later  Recheck fasting cholesterol when you see Nicki Reaper in September

## 2021-09-11 DIAGNOSIS — L03114 Cellulitis of left upper limb: Secondary | ICD-10-CM | POA: Diagnosis not present

## 2021-09-11 DIAGNOSIS — S40862A Insect bite (nonvenomous) of left upper arm, initial encounter: Secondary | ICD-10-CM | POA: Diagnosis not present

## 2021-10-06 DIAGNOSIS — Z950 Presence of cardiac pacemaker: Secondary | ICD-10-CM | POA: Insufficient documentation

## 2021-10-06 NOTE — Progress Notes (Signed)
Cardiology Office Note:    Date:  10/07/2021   ID:  [name], DOB [birth year], MRN [id number]  PCP:  Janith Lima, MD  [place] Providers Cardiologist:  Sherren Mocha, MD Cardiology APP:  Liliane Shi, PA-C  Electrophysiologist:  Will Meredith Leeds, MD     Referring MD: Janith Lima, MD   Chief Complaint:  Follow-up for valvular heart disease, CAD, HFpEF    Patient Profile: Valvular heart disease, Rheumatic Fever as a child Aortic stenosis, s/p bioprosthetic AVR in 2012 Echocardiogram 9/18: mean 14 mmHg Echo 3/21: EF 65-70, Gr 2 DD, normally functioning AVR (mean 14 mmHg) Echocardiogram 2/23: AVR w mean 22 mmHg (cath 3/23: mean 30 mmHg) Mitral regurgitation Echo 3/21: Mild-moderate MR Echocardiogram 2/23: no MR Coronary artery disease  S/p CABG x 1 in 2012 (RIMA-RCA) Myoview 3/21: No ischemia, EF 68; low risk>>Chest pain improved on nitrates  Cath 3/23: patent R-RCA, non-obs CAD in LCx, LAD - med Rx  (HFpEF) heart failure with preserved ejection fraction  Echo 3/21: EF 65-70, GRII DD RHC 3/23: low normal R and L heart filling pressures  Carotid artery disease [place] 10/22: Bilateral ICA 1-39 Left subclavian stenosis LBBB Symptomatic bradycardia S/p Pacemaker in 12/21 Vertigo 2/2 labyrinthitis  GERD Impaired glucose tolerance  Prior CV Studies: Cardiac catheterization 27-Apr-2021 RCA prox 40, mid 100 CTO, dist 90 LCx mid 30; OM1 25 LAD mid 40 RIMA-RCA patent Low normal L and R heart filling pressures Normal Fick CO/CI Mod stenosis of bioprosthetic AVR (mean 30 mmHg)   Echocardiogram 03/18/21 EF 60-65, no RWMA, Gr 1 DD, normal RVSF, normal PASP, no MR, AVR w no AI and mean 22 mmHg   Carotid US 11/15/2020 Bilateral ICA 1-39; left subclavian stenosis   Myoview 05/03/2019 EF 68, small fixed apical defect, no ischemia, low risk   Echocardiogram 05/03/2019 EF 65-70, no RWMA, GRII DD, normal RVSF, RVSP 28.8, mild-moderate MR, s/p AVR -normally  functioning, no PVL   Echocardiogram 10/14/2016 Mild LVH, EF 60-65, Normally functioning bioprosthetic aortic valve replacement (mean 14, peak 26), mild MR, moderate LAE   Cardiac catheterization 04/03/2010 EF 55 RCA ostial 40-50, distal 90-95 LM normal LCx normal LAD mid 30 >> CABG  History of Present Illness:   Heather Crawford is a 76 y.o. female with the above problem list.  Heather Crawford was last seen in March 2023. Heather Crawford returns for f/u.  Heather Crawford is here alone.  Heather Crawford had a recent bug bite on her arm that got infected and Heather Crawford was treated with antibiotics.  Heather Crawford also has issues with vertigo.  Heather Crawford has a lot of sinus issues.  Heather Crawford has not had chest pain, shortness of breath, syncope, orthopnea, leg edema.        Past Medical History:  Diagnosis Date   Allergic rhinitis, cause unspecified    Aortic stenosis    a.  echo 1/12 w/mildly dilated LV, EF 45-50% w/paradoxical septal motion consistent w/ LBBB, moderate aortic stenosis w/mean gradient 27 mmHg, trivial AI, mild to moderate MR w/calcified mitral valve;   b. s/p AVR with 21 mm Magna Ease valve   Aortic valve replaced 2012   Arthritis    joint pain   CAD (coronary artery disease)    a. s/p CABG 4/12: RIMA-RCA   CAD, multiple vessel    sees Dr Aundra Dubin every 3-4 months   Cancer [facility])    skin sarcomas removed   Carotid artery disease ([place])    Carotid US 10/21: Bilateral  ICA 1-39; left subclavian stenosis   CHF (congestive heart failure) (HCC)    chronic diastolic CHF   Chronic back pain    H/O BACK SURGERY   Coronary artery disease    adenosine myoview 2/12 w/EF 56%, ISCHEMIA W/SCAR IN THE MID TO APICAL ANTERIO WALL, SEPTAL WALL, AND APEX. LHC 3/12 W/EF 55%, 40% OSTIAL RCA, 60% MID RCA, 95% DISTAL RC A, INTERVENTION  ATTEMPTED BUT UNABLE TO ADEQUADATELY SEAT  CATHETER.   Diverticulitis of colon (without mention of hemorrhage)(562.11)    Diverticulosis    Esophageal reflux    Glucose intolerance (impaired glucose tolerance)    A1C 6.3 (4/12)    H/O hiatal hernia    H/O vertigo    with fluid  in ears   Heart murmur    Hemorrhoids    Hemorrhoids    History of rheumatic fever    Hx of hysterectomy    Hx: UTI (urinary tract infection)    Hyperlipidemia    LBBB (left bundle branch block)    Myocardial infarction ([place]) 2002   Pneumonia    PONV (postoperative nausea and vomiting)    PPD positive    in the past   Snoring    Squamous cell carcinoma of skin    nose   Stenosis of left subclavian artery ([place])    Carotid US 10/22: Bilateral ICA 1-39; L subclavian stenosis   Urinary frequency    Valvular heart disease s/p AVR in 2012 02/07/2010   AoV Replacement with Bovine prosthesis ' 12 Echo 2/23: EF 60-65, no RWMA, GR 1 DD, normal RVSF, normal PASP, AVR without aortic stenosis or AI (mean gradient 22 mmHg)   Current Medications: Current Meds  Medication Sig   acetaminophen (TYLENOL) 325 MG tablet Take 1-2 tablets (325-650 mg total) by mouth every 4 (four) hours as needed for mild pain.   albuterol (VENTOLIN HFA) 108 (90 Base) MCG/ACT inhaler Inhale 2 puffs into the lungs every 6 (six) hours as needed (Congestion).   amLODipine (NORVASC) 2.5 MG tablet Take 1 tablet (2.5 mg total) by mouth daily.   aspirin EC 81 MG tablet Take 1 tablet (81 mg total) by mouth daily. Swallow whole. (Patient taking differently: Take 81 mg by mouth at bedtime. Swallow whole.)   Cholecalciferol 2000 units TABS Take 1 tablet (2,000 Units total) by mouth daily.   ezetimibe (ZETIA) 10 MG tablet Take 1 tablet (10 mg total) by mouth daily.   fluticasone (FLONASE) 50 MCG/ACT nasal spray USE 1 SPRAY IN EACH NOSTRIL EVERY DAY   furosemide (LASIX) 20 MG tablet TAKE 1 TABLET DAILY AS NEEDED FOR EDEMA OR WEIGHT GAIN. TAKE WITH POTASSIUM   irbesartan (AVAPRO) 150 MG tablet TAKE 1/2 TABLET IN THE MORNING  AND TAKE 1/2 TABLET AT BEDTIME   isosorbide mononitrate (IMDUR) 30 MG 24 hr tablet TAKE 1 TABLET EVERY DAY   meclizine (ANTIVERT) 25 MG tablet Take 25 mg by mouth 3  (three) times daily as needed for dizziness.   montelukast (SINGULAIR) 10 MG tablet Take 1 tablet (10 mg total) by mouth at bedtime.   omeprazole (PRILOSEC) 40 MG capsule TAKE 1 CAPSULE EVERY DAY   potassium chloride (KLOR-CON) 10 MEQ tablet Take 1 tablet (10 mEq total) by mouth as needed. Take with lasix only.   simvastatin (ZOCOR) 40 MG tablet Take 1 tablet (40 mg total) by mouth daily.    Allergies:   Crestor [rosuvastatin], Lipitor [atorvastatin], Sulfamethoxazole-trimethoprim, Tape, Amoxicillin, Neomycin, Neosporin [neomycin-bacitracin zn-polymyx], and Polysporin [bacitracin-polymyxin b]  Social History   Tobacco Use   Smoking status: Former    Packs/day: 1.00    Years: 40.00    Total pack years: 40.00    Types: Cigarettes    Quit date: 02/03/2000    Years since quitting: 21.6   Smokeless tobacco: Former    Types: Chew   Tobacco comments:    discussed LDCT   Vaping Use   Vaping Use: Never used  Substance Use Topics   Alcohol use: No    Alcohol/week: 0.0 standard drinks of alcohol   Drug use: No    Family Hx: The patient's family history includes Breast cancer in her maternal aunt, mother, paternal grandmother, and sister; CAD in her father; Colon cancer in her maternal aunt and paternal uncle; Diabetes type II in her mother; Heart attack in her father; Heart disease in her mother; Hypertension in her mother; Hypothyroidism in her mother; Pancreatic cancer in an other family member. There is no history of Stomach cancer, Throat cancer, or Esophageal cancer.  Review of Systems  Neurological:  Positive for vertigo.     EKGs/Labs/Other Test Reviewed:    EKG:  EKG is not ordered today.  The ekg ordered today demonstrates n/a  Recent Labs: 04/22/2021: Hemoglobin 11.5; Platelets 237 07/02/2021: BUN 20; Creatinine, Ser 0.93; Potassium 4.3; Sodium 139; TSH 5.37 07/25/2021: ALT 15   Recent Lipid Panel Recent Labs    07/02/21 0833 07/25/21 0805  CHOL 191 203*  TRIG 151.0*  168*  HDL 61.20 51  VLDL 30.2  --   LDLCALC 100* 122*     Risk Assessment/Calculations/Metrics:         HYPERTENSION CONTROL Vitals:   10/07/21 1028 10/07/21 1235  BP: (!) 158/60 (!) 180/70    The patient's blood pressure is elevated above target today.  In order to address the patient's elevated BP: Blood pressure will be monitored at home to determine if medication changes need to be made.       Physical Exam:    VS:  BP (!) 180/70   Pulse 72   Ht '5\' 2"'$  (1.575 m)   Wt 164 lb 9.6 oz (74.7 kg)   SpO2 96%   BMI 30.11 kg/m     Wt Readings from Last 3 Encounters:  10/07/21 164 lb 9.6 oz (74.7 kg)  07/02/21 162 lb (73.5 kg)  06/24/21 161 lb 9.6 oz (73.3 kg)    Constitutional:      Appearance: Healthy appearance. Not in distress.  Neck:     Vascular: JVD normal.  Pulmonary:     Breath sounds: No wheezing. No rales.  Cardiovascular:     Normal rate. Regular rhythm. Normal S1. Normal S2.      Murmurs: There is a grade 3/6 crescendo-decrescendo systolic murmur at the URSB.  Edema:    Peripheral edema absent.  Abdominal:     Palpations: Abdomen is soft.  Skin:    General: Skin is warm and dry.  Neurological:     General: No focal deficit present.     Mental Status: Alert and oriented to person, place and time.          ASSESSMENT & PLAN:   Atherosclerosis of aorta (HCC) Continue aspirin, statin.  Bilateral carotid artery disease (HCC) Bilateral ICA stenosis 1-39% by ultrasound in October 2022.  Heather Crawford is scheduled for follow-up carotid US in October 2023.  Continue aspirin, statin therapy.  Essential hypertension Blood pressure above target.  Heather Crawford is fasting this morning for follow-up  blood work and has not taken any of her blood pressure medications.  Heather Crawford notes optimal blood pressures at home.  I have asked her to take her medicines when Heather Crawford gets home and to continue to monitor for the next week.  If Heather Crawford continues to note blood pressure readings above 140/90, Heather Crawford  knows to contact me.  For now, continue amlodipine 2.5 mg daily, irbesartan 75 mg twice daily, Imdur 30 mg daily.  Valvular heart disease s/p AVR in 2012 Heather Crawford has evidence of moderate bioprosthetic aortic valve stenosis.  Heather Crawford has not had symptoms to suggest significant worsening.  Continue current management.  Heather Crawford will follow-up in 6 months.  Arrange follow-up echocardiogram prior to that visit.  (HFpEF) heart failure with preserved ejection fraction (HCC) Volume status overall stable.  Continue furosemide 20 mg daily as needed.  If her blood pressures remain significantly elevated, we could consider changing irbesartan to Entresto.  Hyperlipidemia Continue simvastatin 40 mg daily, ezetimibe 10 mg daily.  Heather Crawford has follow-up lipids scheduled today.  Coronary artery disease involving native coronary artery of native heart with angina pectoris [facility]) Cardiac catheterization in February 2023 demonstrated patent RIMA-RCA and mild nonobstructive disease elsewhere.  Heather Crawford is not having anginal symptoms.  Continue aspirin 81 mg daily, simvastatin 40 mg daily, isosorbide mononitrate 30 mg daily, amlodipine 2.5 mg daily.             Dispo:  Return in about 6 months (around 04/07/2022) for Routine follow up in 6 months with Margaret Pyle.   Medication Adjustments/Labs and Tests Ordered: Current medicines are reviewed at length with the patient today.  Concerns regarding medicines are outlined above.  Tests Ordered: No orders of the defined types were placed in this encounter.  Medication Changes: No orders of the defined types were placed in this encounter.  Signed, Richardson Dopp, PA-C  10/07/2021 12:43 PM    [address], [place], [place]  [address] Phone: [phone]; Fax: [phone]

## 2021-10-07 ENCOUNTER — Ambulatory Visit: Payer: Medicare HMO

## 2021-10-07 ENCOUNTER — Ambulatory Visit: Payer: Medicare HMO | Attending: Physician Assistant | Admitting: Physician Assistant

## 2021-10-07 ENCOUNTER — Encounter: Payer: Self-pay | Admitting: Physician Assistant

## 2021-10-07 VITALS — BP 180/70 | HR 72 | Ht 62.0 in | Wt 164.6 lb

## 2021-10-07 DIAGNOSIS — Z950 Presence of cardiac pacemaker: Secondary | ICD-10-CM | POA: Diagnosis not present

## 2021-10-07 DIAGNOSIS — I6523 Occlusion and stenosis of bilateral carotid arteries: Secondary | ICD-10-CM | POA: Diagnosis not present

## 2021-10-07 DIAGNOSIS — I10 Essential (primary) hypertension: Secondary | ICD-10-CM

## 2021-10-07 DIAGNOSIS — I700 Atherosclerosis of aorta: Secondary | ICD-10-CM

## 2021-10-07 DIAGNOSIS — E782 Mixed hyperlipidemia: Secondary | ICD-10-CM

## 2021-10-07 DIAGNOSIS — I5032 Chronic diastolic (congestive) heart failure: Secondary | ICD-10-CM | POA: Diagnosis not present

## 2021-10-07 DIAGNOSIS — I25119 Atherosclerotic heart disease of native coronary artery with unspecified angina pectoris: Secondary | ICD-10-CM | POA: Diagnosis not present

## 2021-10-07 DIAGNOSIS — I38 Endocarditis, valve unspecified: Secondary | ICD-10-CM | POA: Diagnosis not present

## 2021-10-07 LAB — LIPID PANEL
Chol/HDL Ratio: 3 ratio (ref 0.0–4.4)
Cholesterol, Total: 149 mg/dL (ref 100–199)
HDL: 50 mg/dL (ref >39)
LDL Chol Calc (NIH): 68 mg/dL (ref 0–99)
Triglycerides: 185 mg/dL — ABNORMAL HIGH (ref 0–149)
VLDL Cholesterol Cal: 31 mg/dL (ref 5–40)

## 2021-10-07 LAB — HEPATIC FUNCTION PANEL
ALT: 17 IU/L (ref 0–32)
AST: 19 IU/L (ref 0–40)
Albumin: 4.4 g/dL (ref 3.8–4.8)
Alkaline Phosphatase: 60 IU/L (ref 44–121)
Bilirubin Total: 0.5 mg/dL (ref 0.0–1.2)
Bilirubin, Direct: 0.14 mg/dL (ref 0.00–0.40)
Total Protein: 6.8 g/dL (ref 6.0–8.5)

## 2021-10-07 NOTE — Assessment & Plan Note (Signed)
Bilateral ICA stenosis 1-39% by ultrasound in October 2022.  She is scheduled for follow-up carotid US in October 2023.  Continue aspirin, statin therapy.

## 2021-10-07 NOTE — Assessment & Plan Note (Signed)
Continue simvastatin 40 mg daily, ezetimibe 10 mg daily.  She has follow-up lipids scheduled today.

## 2021-10-07 NOTE — Assessment & Plan Note (Signed)
Blood pressure above target.  She is fasting this morning for follow-up blood work and has not taken any of her blood pressure medications.  She notes optimal blood pressures at home.  I have asked her to take her medicines when she gets home and to continue to monitor for the next week.  If she continues to note blood pressure readings above 140/90, she knows to contact me.  For now, continue amlodipine 2.5 mg daily, irbesartan 75 mg twice daily, Imdur 30 mg daily.

## 2021-10-07 NOTE — Assessment & Plan Note (Signed)
She has evidence of moderate bioprosthetic aortic valve stenosis.  She has not had symptoms to suggest significant worsening.  Continue current management.  She will follow-up in 6 months.  Arrange follow-up echocardiogram prior to that visit.

## 2021-10-07 NOTE — Patient Instructions (Signed)
Medication Instructions:   Your physician recommends that you continue on your current medications as directed. Please refer to the Current Medication list given to you today.  *If you need a refill on your cardiac medications before your next appointment, please call your pharmacy*   Lab Work:  TODAY!!!  If you have labs (blood work) drawn today and your tests are completely normal, you will receive your results only by: [place] (if you have MyChart) OR A paper copy in the mail If you have any lab test that is abnormal or we need to change your treatment, we will call you to review the results.   Testing/Procedures:  Your physician has requested that you have an echocardiogram. Echocardiography is a painless test that uses sound waves to create images of your heart. It provides your doctor with information about the size and shape of your heart and how well your heart's chambers and valves are working. This procedure takes approximately one hour. There are no restrictions for this procedure.  Your physician has requested that you have a carotid duplex. This test is an ultrasound of the carotid arteries in your neck. It looks at blood flow through these arteries that supply the brain with blood. Allow one hour for this exam. There are no restrictions or special instructions.    Follow-Up: At [facility], you and your health needs are our priority.  As part of our continuing mission to provide you with exceptional heart care, we have created designated Provider Care Teams.  These Care Teams include your primary Cardiologist (physician) and Advanced Practice Providers (APPs -  Physician Assistants and Nurse Practitioners) who all work together to provide you with the care you need, when you need it.  We recommend signing up for the patient portal called "MyChart".  Sign up information is provided on this After Visit Summary.  MyChart is used to connect with patients for  Virtual Visits (Telemedicine).  Patients are able to view lab/test results, encounter notes, upcoming appointments, etc.  Non-urgent messages can be sent to your provider as well.   To learn more about what you can do with MyChart, go to [email or web address].    Your next appointment:   6 month(s)  The format for your next appointment:   In Person  Provider:   Richardson Dopp, PA-C         Check you blood pressure for 1 week and send readings on mychart  Important Information About Sugar

## 2021-10-07 NOTE — Assessment & Plan Note (Signed)
Continue aspirin, statin.  

## 2021-10-07 NOTE — Assessment & Plan Note (Signed)
Cardiac catheterization in February 2023 demonstrated patent RIMA-RCA and mild nonobstructive disease elsewhere.  She is not having anginal symptoms.  Continue aspirin 81 mg daily, simvastatin 40 mg daily, isosorbide mononitrate 30 mg daily, amlodipine 2.5 mg daily.

## 2021-10-07 NOTE — Assessment & Plan Note (Signed)
Volume status overall stable.  Continue furosemide 20 mg daily as needed.  If her blood pressures remain significantly elevated, we could consider changing irbesartan to Entresto.

## 2021-10-14 ENCOUNTER — Other Ambulatory Visit: Payer: Self-pay | Admitting: Cardiovascular Disease

## 2021-10-16 ENCOUNTER — Ambulatory Visit (INDEPENDENT_AMBULATORY_CARE_PROVIDER_SITE_OTHER): Payer: Medicare HMO

## 2021-10-16 DIAGNOSIS — I442 Atrioventricular block, complete: Secondary | ICD-10-CM

## 2021-10-16 LAB — CUP PACEART REMOTE DEVICE CHECK
Battery Remaining Longevity: 161 mo
Battery Voltage: 3.04 V
Brady Statistic AP VP Percent: 0.03 %
Brady Statistic AP VS Percent: 22.58 %
Brady Statistic AS VP Percent: 0.03 %
Brady Statistic AS VS Percent: 77.36 %
Brady Statistic RA Percent Paced: 22.61 %
Brady Statistic RV Percent Paced: 0.06 %
Date Time Interrogation Session: 20230914090353
Implantable Lead Implant Date: 20211215
Implantable Lead Implant Date: 20211215
Implantable Lead Location: 753859
Implantable Lead Location: 753860
Implantable Lead Model: 5076
Implantable Lead Model: 5076
Implantable Pulse Generator Implant Date: 20211215
Lead Channel Impedance Value: 285 Ohm
Lead Channel Impedance Value: 399 Ohm
Lead Channel Impedance Value: 570 Ohm
Lead Channel Impedance Value: 627 Ohm
Lead Channel Pacing Threshold Amplitude: 0.5 V
Lead Channel Pacing Threshold Amplitude: 0.5 V
Lead Channel Pacing Threshold Pulse Width: 0.4 ms
Lead Channel Pacing Threshold Pulse Width: 0.4 ms
Lead Channel Sensing Intrinsic Amplitude: 1.625 mV
Lead Channel Sensing Intrinsic Amplitude: 1.625 mV
Lead Channel Sensing Intrinsic Amplitude: 14.625 mV
Lead Channel Sensing Intrinsic Amplitude: 14.625 mV
Lead Channel Setting Pacing Amplitude: 1.5 V
Lead Channel Setting Pacing Amplitude: 2 V
Lead Channel Setting Pacing Pulse Width: 0.4 ms
Lead Channel Setting Sensing Sensitivity: 1.2 mV

## 2021-10-22 ENCOUNTER — Other Ambulatory Visit: Payer: Self-pay

## 2021-10-22 ENCOUNTER — Other Ambulatory Visit: Payer: Self-pay | Admitting: Physician Assistant

## 2021-10-22 MED ORDER — AMLODIPINE BESYLATE 2.5 MG PO TABS: 2.5000 mg | ORAL_TABLET | Freq: Every day | ORAL | 3 refills | Status: DC

## 2021-10-29 NOTE — Progress Notes (Signed)
Remote pacemaker transmission.   

## 2021-11-04 ENCOUNTER — Ambulatory Visit (HOSPITAL_COMMUNITY)
Admission: RE | Admit: 2021-11-04 | Discharge: 2021-11-04 | Disposition: A | Discharge status: Home / Self Care | Payer: Medicare HMO | Source: Ambulatory Visit | Attending: Cardiology | Admitting: Cardiology

## 2021-11-04 DIAGNOSIS — I779 Disorder of arteries and arterioles, unspecified: Secondary | ICD-10-CM | POA: Insufficient documentation

## 2021-11-04 DIAGNOSIS — I6523 Occlusion and stenosis of bilateral carotid arteries: Secondary | ICD-10-CM

## 2021-11-06 ENCOUNTER — Telehealth: Payer: Self-pay | Admitting: *Deleted

## 2021-11-06 ENCOUNTER — Encounter: Payer: Self-pay | Admitting: Physician Assistant

## 2021-11-06 DIAGNOSIS — I779 Disorder of arteries and arterioles, unspecified: Secondary | ICD-10-CM

## 2021-11-06 NOTE — Telephone Encounter (Signed)
-----   Message from Liliane Shi, [place] sent at 11/06/2021  7:49 AM EDT ----- Results sent to [name] via [facility]. See MyChart comment. PLAN: -Send copy to PCP -Repeat Carotid US in 1 year. Richardson Dopp, PA-C    11/06/2021 7:46 AM

## 2021-11-10 ENCOUNTER — Other Ambulatory Visit: Payer: Self-pay | Admitting: Internal Medicine

## 2021-11-10 DIAGNOSIS — J309 Allergic rhinitis, unspecified: Secondary | ICD-10-CM

## 2021-11-26 ENCOUNTER — Other Ambulatory Visit: Payer: Self-pay | Admitting: Internal Medicine

## 2021-11-26 DIAGNOSIS — I10 Essential (primary) hypertension: Secondary | ICD-10-CM

## 2021-11-26 DIAGNOSIS — E118 Type 2 diabetes mellitus with unspecified complications: Secondary | ICD-10-CM

## 2021-12-03 ENCOUNTER — Other Ambulatory Visit: Payer: Self-pay | Admitting: Internal Medicine

## 2021-12-03 DIAGNOSIS — J301 Allergic rhinitis due to pollen: Secondary | ICD-10-CM

## 2021-12-08 DIAGNOSIS — H524 Presbyopia: Secondary | ICD-10-CM | POA: Diagnosis not present

## 2021-12-08 DIAGNOSIS — H5213 Myopia, bilateral: Secondary | ICD-10-CM | POA: Diagnosis not present

## 2021-12-08 DIAGNOSIS — H52209 Unspecified astigmatism, unspecified eye: Secondary | ICD-10-CM | POA: Diagnosis not present

## 2021-12-08 LAB — HM DIABETES EYE EXAM

## 2022-01-12 ENCOUNTER — Other Ambulatory Visit: Payer: Self-pay | Admitting: Physician Assistant

## 2022-01-15 ENCOUNTER — Ambulatory Visit (INDEPENDENT_AMBULATORY_CARE_PROVIDER_SITE_OTHER): Payer: Medicare HMO

## 2022-01-15 DIAGNOSIS — I442 Atrioventricular block, complete: Secondary | ICD-10-CM | POA: Diagnosis not present

## 2022-01-15 LAB — CUP PACEART REMOTE DEVICE CHECK
Battery Remaining Longevity: 158 mo
Battery Voltage: 3.03 V
Brady Statistic AP VP Percent: 0.02 %
Brady Statistic AP VS Percent: 17.61 %
Brady Statistic AS VP Percent: 0.03 %
Brady Statistic AS VS Percent: 82.34 %
Brady Statistic RA Percent Paced: 17.67 %
Brady Statistic RV Percent Paced: 0.05 %
Date Time Interrogation Session: 20231214090730
Implantable Lead Connection Status: 753985
Implantable Lead Connection Status: 753985
Implantable Lead Implant Date: 20211215
Implantable Lead Implant Date: 20211215
Implantable Lead Location: 753859
Implantable Lead Location: 753860
Implantable Lead Model: 5076
Implantable Lead Model: 5076
Implantable Pulse Generator Implant Date: 20211215
Lead Channel Impedance Value: 285 Ohm
Lead Channel Impedance Value: 361 Ohm
Lead Channel Impedance Value: 684 Ohm
Lead Channel Impedance Value: 741 Ohm
Lead Channel Pacing Threshold Amplitude: 0.5 V
Lead Channel Pacing Threshold Amplitude: 0.5 V
Lead Channel Pacing Threshold Pulse Width: 0.4 ms
Lead Channel Pacing Threshold Pulse Width: 0.4 ms
Lead Channel Sensing Intrinsic Amplitude: 1.25 mV
Lead Channel Sensing Intrinsic Amplitude: 1.25 mV
Lead Channel Sensing Intrinsic Amplitude: 16.375 mV
Lead Channel Sensing Intrinsic Amplitude: 16.375 mV
Lead Channel Setting Pacing Amplitude: 1.5 V
Lead Channel Setting Pacing Amplitude: 2 V
Lead Channel Setting Pacing Pulse Width: 0.4 ms
Lead Channel Setting Sensing Sensitivity: 1.2 mV
Zone Setting Status: 755011

## 2022-02-06 NOTE — Progress Notes (Signed)
Remote pacemaker transmission.   

## 2022-03-08 DIAGNOSIS — R509 Fever, unspecified: Secondary | ICD-10-CM | POA: Diagnosis not present

## 2022-03-08 DIAGNOSIS — R051 Acute cough: Secondary | ICD-10-CM | POA: Diagnosis not present

## 2022-03-08 DIAGNOSIS — R0981 Nasal congestion: Secondary | ICD-10-CM | POA: Diagnosis not present

## 2022-03-10 ENCOUNTER — Other Ambulatory Visit (HOSPITAL_COMMUNITY): Payer: Medicare HMO

## 2022-03-16 ENCOUNTER — Ambulatory Visit: Payer: Medicare HMO | Admitting: Physician Assistant

## 2022-03-16 NOTE — Progress Notes (Unsigned)
Cardiology Office Note:    Date:  03/17/2022   ID:  [name], DOB 04-11-1945, MRN [id number]  Subjective   PCP:  Janith Lima, MD  [place] Providers Cardiologist:  Sherren Mocha, MD Cardiology APP:  Liliane Shi, PA-C  Electrophysiologist:  Will Meredith Leeds, MD    Referring MD: Janith Lima, MD   Patient Profile: Valvular heart disease, Rheumatic Fever as a child Aortic stenosis, s/p bioprosthetic AVR in 2012 Cath 04/2021: mean AV 30 mmHg TTE 03/18/21: EF 60-65, no RWMA, Gr 1 DD, normal RVSF, normal PASP, no MR, AVR w no AI and mean 22 mmHg  Mitral regurgitation Mild to mod MR on TTE in 2021; no MR in 2023 Coronary artery disease  S/p CABG x 1 in 2012 (RIMA-RCA) Myoview 3/21: No ischemia, EF 68; low risk>>Chest pain improved on nitrates  Cath 04/07/21: mRCA 100 CTO, mLCx 30, OM1 25, mLAD 40; patent R-RCA - med Rx  (HFpEF) heart failure with preserved ejection fraction  Echo 3/21: EF 65-70, GRII DD RHC 3/23: low normal R and L heart filling pressures  Carotid artery disease [place] 11/15/20: Bilateral ICA 1-39; L subcl stenosis  [place] 11/04/21: Bilat ICA 1-39; normal flow bilat subcl arteries  Left subclavian stenosis Noted 11/2020; normal flow on [place] in 2023  Aortic atherosclerosis  LBBB Symptomatic bradycardia S/p Pacemaker in 12/21 Vertigo 2/2 labyrinthitis  GERD Impaired glucose tolerance    History of Present Illness:   Heather Crawford is a 77 y.o. female with the above problem list.  She was last seen 10/07/21. She returns for f/u on valvular heart disease, heart failure and coronary artery disease.  She is here alone.  She had COVID-19 a couple of weeks ago.  She took Paxlovid.  She still has some congestion.  Her husband also had it.  They did eat a lot of soup while they were sick.  She has noted worsening shortness of breath.  She describes orthopnea.  She has to stop several times walking to the grocery store to catch her breath.  She has not  had PND, chest pain or syncope.  EKG: NSR, HR 84, left bundle branch block    Reviewed and updated this encounter:  Tobacco  Allergies  Meds  Problems  Med Hx  Surg Hx  Fam Hx     Review of Systems  Gastrointestinal:  Negative for hematochezia and melena.  Genitourinary:  Negative for hematuria.    Objective   Labs/Other Test Reviewed:   Recent Labs: 04/22/2021: Hemoglobin 11.5; Platelets 237 07/02/2021: BUN 20; Creatinine, Ser 0.93; Potassium 4.3; Sodium 139; TSH 5.37 10/07/2021: ALT 17   Recent Lipid Panel Recent Labs    07/02/21 0833 07/25/21 0805 10/07/21 1124  CHOL 191   < > 149  TRIG 151.0*   < > 185*  HDL 61.20   < > 50  VLDL 30.2  --   --   LDLCALC 100*   < > 68   < > = values in this interval not displayed.     Risk Assessment/Calculations/Metrics:             Physical Exam:   VS:  BP 134/66   Pulse 84   Ht 5' 2"$  (1.575 m)   Wt 169 lb 3.2 oz (76.7 kg)   SpO2 97%   BMI 30.95 kg/m    Wt Readings from Last 3 Encounters:  03/17/22 169 lb 3.2 oz (76.7 kg)  10/07/21 164 lb  9.6 oz (74.7 kg)  07/02/21 162 lb (73.5 kg)    Constitutional:      Appearance: Healthy appearance. Not in distress.  Neck:     Vascular: JVD normal.  Pulmonary:     Effort: Pulmonary effort is normal.     Breath sounds: No wheezing. No rales.  Cardiovascular:     Normal rate. Regular rhythm. Normal S1. Normal S2.      Murmurs: There is a grade 2/6 systolic murmur at the URSB.  Edema:    Peripheral edema present.    Ankle: bilateral trace edema of the ankle. Abdominal:     Palpations: Abdomen is soft.     Assessment & Plan     ASSESSMENT & PLAN:   (HFpEF) heart failure with preserved ejection fraction (HCC) NYHA IIb-III. She is up 5 lbs. She ate a lot of soup while she was sick. She seems to be s/w volume overloaded. She has had issues with dyspnea on exertion off and on and only takes Lasix prn. Her echocardiogram is pending 2/27.  Increase Lasix to 20 mg once daily along  with K+ 10 mEq once daily x 3 days, then decrease Lasix/K+ to 3 days/week BMET 1 week F/u on TTE in a couple of weeks F/u with Dr. Burt Knack or me in 2-3 mos Consider SGLT2/MRA or changing Valsartan to Entresto   Valvular heart disease s/p AVR in 2012 She has evidence of moderate bioprosthetic aortic valve stenosis.  She does note worsening shortness of breath which I believe is related to volume excess.  Follow-up echocardiogram is pending in 2 weeks.  Continue SBE prophylaxis.  Coronary artery disease involving native coronary artery of native heart with angina pectoris [facility]) Cardiac catheterization in February 2023 demonstrated patent RIMA-RCA and mild nonobstructive disease elsewhere.  She is not having chest pain to suggest angina.  Continue aspirin 81 mg daily, simvastatin 40 mg daily.  Essential hypertension The patient's blood pressure is controlled on her current regimen.  Continue current therapy.   Bilateral carotid artery disease ([place]) Follow-up carotid US due in October 2024.  Hyperlipidemia LDL optimal on most recent lab work.  She was concerned that the swelling in her hands and feet was related to simvastatin.  However, I suspect she is volume overloaded and have adjusted her Lasix as outlined.  Continue simvastatin 40 mg daily, Zetia 10 mg daily.  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              Dispo:  Return in about 3 months (around 06/15/2022) for Routine Follow Up, w/ Dr. Burt Knack, or Richardson Dopp, PA-C.   Signed, Richardson Dopp, PA-C  03/17/2022 8:35 AM    [facility] [address], [place], [place]  [address] Phone: [phone]; Fax: [phone]

## 2022-03-17 ENCOUNTER — Ambulatory Visit: Payer: HMO | Attending: Physician Assistant | Admitting: Physician Assistant

## 2022-03-17 ENCOUNTER — Encounter: Payer: Self-pay | Admitting: Physician Assistant

## 2022-03-17 VITALS — BP 134/66 | HR 84 | Ht 62.0 in | Wt 169.2 lb

## 2022-03-17 DIAGNOSIS — I5032 Chronic diastolic (congestive) heart failure: Secondary | ICD-10-CM | POA: Diagnosis not present

## 2022-03-17 DIAGNOSIS — I442 Atrioventricular block, complete: Secondary | ICD-10-CM | POA: Diagnosis not present

## 2022-03-17 DIAGNOSIS — I25119 Atherosclerotic heart disease of native coronary artery with unspecified angina pectoris: Secondary | ICD-10-CM

## 2022-03-17 DIAGNOSIS — I38 Endocarditis, valve unspecified: Secondary | ICD-10-CM

## 2022-03-17 DIAGNOSIS — I6523 Occlusion and stenosis of bilateral carotid arteries: Secondary | ICD-10-CM | POA: Diagnosis not present

## 2022-03-17 DIAGNOSIS — I10 Essential (primary) hypertension: Secondary | ICD-10-CM | POA: Diagnosis not present

## 2022-03-17 DIAGNOSIS — E782 Mixed hyperlipidemia: Secondary | ICD-10-CM | POA: Diagnosis not present

## 2022-03-17 MED ORDER — FUROSEMIDE 20 MG PO TABS: ORAL_TABLET | ORAL | 3 refills | Status: DC

## 2022-03-17 MED ORDER — POTASSIUM CHLORIDE CRYS ER 10 MEQ PO TBCR: EXTENDED_RELEASE_TABLET | ORAL | 3 refills | Status: DC

## 2022-03-17 NOTE — Assessment & Plan Note (Signed)
Cardiac catheterization in February 2023 demonstrated patent RIMA-RCA and mild nonobstructive disease elsewhere.  She is not having chest pain to suggest angina.  Continue aspirin 81 mg daily, simvastatin 40 mg daily.

## 2022-03-17 NOTE — Assessment & Plan Note (Signed)
Follow up with EP as planned.

## 2022-03-17 NOTE — Assessment & Plan Note (Signed)
She has evidence of moderate bioprosthetic aortic valve stenosis.  She does note worsening shortness of breath which I believe is related to volume excess.  Follow-up echocardiogram is pending in 2 weeks.  Continue SBE prophylaxis.

## 2022-03-17 NOTE — Assessment & Plan Note (Signed)
Follow-up carotid US due in October 2024.

## 2022-03-17 NOTE — Assessment & Plan Note (Signed)
The patient's blood pressure is controlled on her current regimen.  Continue current therapy.   

## 2022-03-17 NOTE — Patient Instructions (Signed)
Medication Instructions:  Your physician has recommended you make the following change in your medication:   INCREASE the Lasix to 20 mg taking 1 daily for 3 days then reduce it to 1 tablet on Mondays, Wednesdays, & Fridays.  INCREASE the Potassium to 10 meq taking 1 daily for 3 days then reduce it to 1 tablet on Mondays, Wednesdays, & Fridays   *If you need a refill on your cardiac medications before your next appointment, please call your pharmacy*   Lab Work: 1 WEEK:  BMET  If you have labs (blood work) drawn today and your tests are completely normal, you will receive your results only by: [facility] (if you have MyChart) OR A paper copy in the mail If you have any lab test that is abnormal or we need to change your treatment, we will call you to review the results.   Testing/Procedures: None ordered   Follow-Up: At [facility], you and your health needs are our priority.  As part of our continuing mission to provide you with exceptional heart care, we have created designated Provider Care Teams.  These Care Teams include your primary Cardiologist (physician) and Advanced Practice Providers (APPs -  Physician Assistants and Nurse Practitioners) who all work together to provide you with the care you need, when you need it.  We recommend signing up for the patient portal called "MyChart".  Sign up information is provided on this After Visit Summary.  MyChart is used to connect with patients for Virtual Visits (Telemedicine).  Patients are able to view lab/test results, encounter notes, upcoming appointments, etc.  Non-urgent messages can be sent to your provider as well.   To learn more about what you can do with MyChart, go to [email or web address].    Your next appointment:   2 month(s)  Provider:   Richardson Dopp, PA-C         Other Instructions

## 2022-03-17 NOTE — Assessment & Plan Note (Signed)
NYHA IIb-III. She is up 5 lbs. She ate a lot of soup while she was sick. She seems to be s/w volume overloaded. She has had issues with dyspnea on exertion off and on and only takes Lasix prn. Her echocardiogram is pending 2/27.  Increase Lasix to 20 mg once daily along with K+ 10 mEq once daily x 3 days, then decrease Lasix/K+ to 3 days/week BMET 1 week F/u on TTE in a couple of weeks F/u with Dr. Burt Knack or me in 2-3 mos Consider SGLT2/MRA or changing Valsartan to [facility]

## 2022-03-17 NOTE — Assessment & Plan Note (Signed)
LDL optimal on most recent lab work.  She was concerned that the swelling in her hands and feet was related to simvastatin.  However, I suspect she is volume overloaded and have adjusted her Lasix as outlined.  Continue simvastatin 40 mg daily, Zetia 10 mg daily.

## 2022-03-23 ENCOUNTER — Other Ambulatory Visit (HOSPITAL_COMMUNITY): Payer: Self-pay

## 2022-03-24 ENCOUNTER — Ambulatory Visit: Payer: HMO | Attending: Physician Assistant

## 2022-03-24 DIAGNOSIS — I38 Endocarditis, valve unspecified: Secondary | ICD-10-CM | POA: Diagnosis not present

## 2022-03-24 DIAGNOSIS — I25119 Atherosclerotic heart disease of native coronary artery with unspecified angina pectoris: Secondary | ICD-10-CM | POA: Diagnosis not present

## 2022-03-24 DIAGNOSIS — I5032 Chronic diastolic (congestive) heart failure: Secondary | ICD-10-CM | POA: Diagnosis not present

## 2022-03-25 ENCOUNTER — Telehealth: Payer: Self-pay | Admitting: *Deleted

## 2022-03-25 ENCOUNTER — Other Ambulatory Visit: Payer: HMO

## 2022-03-25 DIAGNOSIS — E559 Vitamin D deficiency, unspecified: Secondary | ICD-10-CM | POA: Diagnosis not present

## 2022-03-25 DIAGNOSIS — I10 Essential (primary) hypertension: Secondary | ICD-10-CM | POA: Diagnosis not present

## 2022-03-25 DIAGNOSIS — Z79899 Other long term (current) drug therapy: Secondary | ICD-10-CM

## 2022-03-25 DIAGNOSIS — M545 Low back pain, unspecified: Secondary | ICD-10-CM | POA: Diagnosis not present

## 2022-03-25 DIAGNOSIS — J309 Allergic rhinitis, unspecified: Secondary | ICD-10-CM | POA: Diagnosis not present

## 2022-03-25 DIAGNOSIS — I0981 Rheumatic heart failure: Secondary | ICD-10-CM | POA: Diagnosis not present

## 2022-03-25 DIAGNOSIS — E785 Hyperlipidemia, unspecified: Secondary | ICD-10-CM | POA: Diagnosis not present

## 2022-03-25 DIAGNOSIS — K219 Gastro-esophageal reflux disease without esophagitis: Secondary | ICD-10-CM | POA: Diagnosis not present

## 2022-03-25 DIAGNOSIS — G8929 Other chronic pain: Secondary | ICD-10-CM | POA: Diagnosis not present

## 2022-03-25 DIAGNOSIS — I499 Cardiac arrhythmia, unspecified: Secondary | ICD-10-CM | POA: Diagnosis not present

## 2022-03-25 DIAGNOSIS — E669 Obesity, unspecified: Secondary | ICD-10-CM | POA: Diagnosis not present

## 2022-03-25 DIAGNOSIS — E876 Hypokalemia: Secondary | ICD-10-CM | POA: Diagnosis not present

## 2022-03-25 DIAGNOSIS — I25709 Atherosclerosis of coronary artery bypass graft(s), unspecified, with unspecified angina pectoris: Secondary | ICD-10-CM | POA: Diagnosis not present

## 2022-03-25 LAB — BASIC METABOLIC PANEL
BUN/Creatinine Ratio: 20 (ref 12–28)
BUN: 18 mg/dL (ref 8–27)
CO2: 24 mmol/L (ref 20–29)
Calcium: 9.2 mg/dL (ref 8.7–10.3)
Chloride: 101 mmol/L (ref 96–106)
Creatinine, Ser: 0.92 mg/dL (ref 0.57–1.00)
Glucose: 220 mg/dL — ABNORMAL HIGH (ref 70–99)
Potassium: 4.4 mmol/L (ref 3.5–5.2)
Sodium: 141 mmol/L (ref 134–144)
eGFR: 64 mL/min/{1.73_m2} (ref >59)

## 2022-03-25 MED ORDER — POTASSIUM CHLORIDE ER 10 MEQ PO TBCR: 10.0000 meq | EXTENDED_RELEASE_TABLET | Freq: Every day | ORAL | 3 refills | Status: DC

## 2022-03-25 MED ORDER — FUROSEMIDE 20 MG PO TABS: 20.0000 mg | ORAL_TABLET | Freq: Every day | ORAL | 3 refills | Status: DC

## 2022-03-25 NOTE — Telephone Encounter (Signed)
-----   Message from Liliane Shi, [place] sent at 03/25/2022  1:48 PM EST ----- Since her breathing is a little better, we can increase Lasix to once daily to improve it more. PLAN:  -Increase Lasix to 20 mg once daily and K+ 10 mEq once daily  -BMET 1 week  Richardson Dopp, PA-C    03/25/2022 1:47 PM

## 2022-03-26 DIAGNOSIS — R051 Acute cough: Secondary | ICD-10-CM | POA: Diagnosis not present

## 2022-03-26 DIAGNOSIS — R0981 Nasal congestion: Secondary | ICD-10-CM | POA: Diagnosis not present

## 2022-03-31 ENCOUNTER — Ambulatory Visit (HOSPITAL_COMMUNITY): Payer: HMO | Attending: Internal Medicine

## 2022-03-31 ENCOUNTER — Ambulatory Visit: Payer: HMO

## 2022-03-31 DIAGNOSIS — I38 Endocarditis, valve unspecified: Secondary | ICD-10-CM | POA: Diagnosis not present

## 2022-03-31 DIAGNOSIS — R0602 Shortness of breath: Secondary | ICD-10-CM | POA: Insufficient documentation

## 2022-03-31 DIAGNOSIS — I25119 Atherosclerotic heart disease of native coronary artery with unspecified angina pectoris: Secondary | ICD-10-CM | POA: Diagnosis not present

## 2022-03-31 DIAGNOSIS — I10 Essential (primary) hypertension: Secondary | ICD-10-CM | POA: Insufficient documentation

## 2022-03-31 DIAGNOSIS — I5032 Chronic diastolic (congestive) heart failure: Secondary | ICD-10-CM | POA: Insufficient documentation

## 2022-03-31 DIAGNOSIS — E785 Hyperlipidemia, unspecified: Secondary | ICD-10-CM | POA: Insufficient documentation

## 2022-03-31 DIAGNOSIS — T82857D Stenosis of cardiac prosthetic devices, implants and grafts, subsequent encounter: Secondary | ICD-10-CM | POA: Diagnosis not present

## 2022-03-31 DIAGNOSIS — Z79899 Other long term (current) drug therapy: Secondary | ICD-10-CM | POA: Diagnosis not present

## 2022-03-31 LAB — ECHOCARDIOGRAM COMPLETE
AR max vel: 0.74 cm2
AV Area VTI: 0.81 cm2
AV Area mean vel: 0.7 cm2
AV Mean grad: 30 mmHg
AV Peak grad: 47.9 mmHg
Ao pk vel: 3.46 m/s
Area-P 1/2: 2.73 cm2
S' Lateral: 2.9 cm

## 2022-04-01 LAB — BASIC METABOLIC PANEL
BUN/Creatinine Ratio: 24 (ref 12–28)
BUN: 23 mg/dL (ref 8–27)
CO2: 24 mmol/L (ref 20–29)
Calcium: 9.3 mg/dL (ref 8.7–10.3)
Chloride: 100 mmol/L (ref 96–106)
Creatinine, Ser: 0.94 mg/dL (ref 0.57–1.00)
Glucose: 113 mg/dL — ABNORMAL HIGH (ref 70–99)
Potassium: 5 mmol/L (ref 3.5–5.2)
Sodium: 140 mmol/L (ref 134–144)
eGFR: 62 mL/min/{1.73_m2} (ref >59)

## 2022-04-08 ENCOUNTER — Telehealth: Payer: Self-pay | Admitting: *Deleted

## 2022-04-08 DIAGNOSIS — I38 Endocarditis, valve unspecified: Secondary | ICD-10-CM

## 2022-04-08 NOTE — Telephone Encounter (Signed)
-----   Message from Liliane Shi, [place] sent at 04/07/2022  5:29 PM EST ----- Called patient to discuss symptoms. She has chronic dyspnea on exertion and occasional jaw pain. There have been no progressively worsening symptoms. She has f/u with me in April. PLAN: -Arrange limited echocardiogram in 6 mos to recheck AVR, aortic stenosis. -Arrange f/u with Dr. Burt Knack 1 week after limited echocardiogram in 6 mos.  Richardson Dopp, PA-C    04/07/2022 5:19 PM

## 2022-04-16 ENCOUNTER — Ambulatory Visit (INDEPENDENT_AMBULATORY_CARE_PROVIDER_SITE_OTHER): Payer: HMO

## 2022-04-16 DIAGNOSIS — I442 Atrioventricular block, complete: Secondary | ICD-10-CM

## 2022-04-17 LAB — CUP PACEART REMOTE DEVICE CHECK
Battery Remaining Longevity: 156 mo
Battery Voltage: 3.03 V
Brady Statistic AP VP Percent: 0.05 %
Brady Statistic AP VS Percent: 10.27 %
Brady Statistic AS VP Percent: 0.03 %
Brady Statistic AS VS Percent: 89.65 %
Brady Statistic RA Percent Paced: 10.61 %
Brady Statistic RV Percent Paced: 0.08 %
Date Time Interrogation Session: 20240313211609
Implantable Lead Connection Status: 753985
Implantable Lead Connection Status: 753985
Implantable Lead Implant Date: 20211215
Implantable Lead Implant Date: 20211215
Implantable Lead Location: 753859
Implantable Lead Location: 753860
Implantable Lead Model: 5076
Implantable Lead Model: 5076
Implantable Pulse Generator Implant Date: 20211215
Lead Channel Impedance Value: 304 Ohm
Lead Channel Impedance Value: 399 Ohm
Lead Channel Impedance Value: 646 Ohm
Lead Channel Impedance Value: 684 Ohm
Lead Channel Pacing Threshold Amplitude: 0.5 V
Lead Channel Pacing Threshold Amplitude: 0.625 V
Lead Channel Pacing Threshold Pulse Width: 0.4 ms
Lead Channel Pacing Threshold Pulse Width: 0.4 ms
Lead Channel Sensing Intrinsic Amplitude: 1 mV
Lead Channel Sensing Intrinsic Amplitude: 1 mV
Lead Channel Sensing Intrinsic Amplitude: 17.125 mV
Lead Channel Sensing Intrinsic Amplitude: 17.125 mV
Lead Channel Setting Pacing Amplitude: 1.5 V
Lead Channel Setting Pacing Amplitude: 2 V
Lead Channel Setting Pacing Pulse Width: 0.4 ms
Lead Channel Setting Sensing Sensitivity: 1.2 mV
Zone Setting Status: 755011

## 2022-05-11 ENCOUNTER — Encounter: Payer: Self-pay | Admitting: Cardiovascular Disease

## 2022-05-11 ENCOUNTER — Other Ambulatory Visit: Payer: Self-pay | Admitting: *Deleted

## 2022-05-11 ENCOUNTER — Other Ambulatory Visit (HOSPITAL_COMMUNITY): Payer: Self-pay

## 2022-05-11 ENCOUNTER — Other Ambulatory Visit: Payer: Self-pay

## 2022-05-11 DIAGNOSIS — E118 Type 2 diabetes mellitus with unspecified complications: Secondary | ICD-10-CM

## 2022-05-11 DIAGNOSIS — I10 Essential (primary) hypertension: Secondary | ICD-10-CM

## 2022-05-11 MED ORDER — IRBESARTAN 150 MG PO TABS
75.0000 mg | ORAL_TABLET | Freq: Two times a day (BID) | ORAL | 3 refills | Status: DC
  Filled 2022-05-11 (×2): qty 90, 90d supply, fill #0

## 2022-05-19 NOTE — Progress Notes (Signed)
Remote pacemaker transmission.   

## 2022-06-01 NOTE — Progress Notes (Unsigned)
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 Aortic stenosis, s/p bioprosthetic AVR in 2012 Cath 04/2021: mean AV 30 mmHg TTE 03/18/21: EF 60-65, no RWMA, Gr 1 DD, normal RVSF, normal PASP, no MR, AVR w no AI and mean 22 mmHg  TTE 03/31/22: EF 60-65, no RWMA, Gr 1 DD, NL RVSF, NL PASP, mild LAE, mild MR, trivial AI, bioprosthetic aortic stenosis (mean 30, Vmax 340 cm/s, DI 0.26) Mitral regurgitation Mild to mod MR on TTE in 2021; no MR in 2023; mild MR 03/2022 Coronary artery disease  S/p CABG x 1 in 2012 (RIMA-RCA) Myoview 3/21: No ischemia, EF 68; low risk>>Chest pain improved on nitrates  Cath 04/07/21: mRCA 100 CTO, mLCx 30, OM1 25, mLAD 40; patent R-RCA - med Rx  (HFpEF) heart failure with preserved ejection fraction  Echo 3/21: EF 65-70, GRII DD RHC 3/23: low normal R and L heart filling pressures  Carotid artery disease [place] 11/15/20: Bilateral ICA 1-39; L subcl stenosis  [place] 11/04/21: Bilat ICA 1-39; normal flow bilat subcl arteries  Left subclavian stenosis Noted 11/2020; normal flow on [place] in 2023  Aortic atherosclerosis  LBBB Symptomatic bradycardia S/p Pacemaker in 12/21 Vertigo 2/2 labyrinthitis  GERD Impaired glucose tolerance      History of Present Illness:   Heather Crawford is a 77 y.o. female who returns for f/u on valvular heart disease, CHF, CAD. She was last seen in 03/2022. She was volume overloaded. Her Lasix was adjusted. She had a f/u echocardiogram in 03/2022 that demonstrated normal EF and increased AV gradient (22>>30). This was reviewed with Dr. Excell Seltzer. I also spoke with the patient after her echocardiogram. Her dyspnea on exertion remained fairly  stable. Plan is to repeat her echocardiogram again in 10/2022.  She is here today with her husband.  She has continued to have a mostly nonproductive cough since she had COVID earlier this year.  She continues to have shortness of breath with certain activities.  She has associated jaw pain with this.  This was felt to be her anginal equivalent.  Her symptoms have not really significantly changed since last year when we had her undergo cardiac catheterization.  She usually has resolution with rest.  She has not had syncope, orthopnea, paroxysmal nocturnal dyspnea.  She has some pedal edema from time to time.  Review of Systems  HENT:  Positive for ear pain.   Gastrointestinal:  Negative for hematochezia and melena.  Genitourinary:  Negative for hematuria.       Studies Reviewed:    EKG: Not done  Risk Assessment/Calculations:     HYPERTENSION CONTROL Vitals:   06/02/22 0931 06/02/22 1807  BP: (!) 161/60 (!) 160/60    The patient's blood pressure is elevated above target today.  In order to address the patient's elevated BP: A new medication was prescribed today.; Blood pressure will be monitored at home to determine if medication changes need to be made.          Physical Exam:   VS:  BP (!) 160/60   Pulse 82   Ht 5\' 2"  (1.575 m)   Wt 166 lb 9.6  oz (75.6 kg)   SpO2 98%   BMI 30.47 kg/m    Wt Readings from Last 3 Encounters:  06/02/22 166 lb 9.6 oz (75.6 kg)  03/17/22 169 lb 3.2 oz (76.7 kg)  10/07/21 164 lb 9.6 oz (74.7 kg)    Constitutional:      Appearance: Healthy appearance. Not in distress.  Neck:     Vascular: JVD normal.  Pulmonary:     Breath sounds: Normal breath sounds. No wheezing. No rales.  Cardiovascular:     Normal rate. Regular rhythm.     Murmurs: There is a grade 3/6 systolic murmur at the URSB, radiating to the neck.  Edema:    Peripheral edema absent.  Abdominal:     Palpations: Abdomen is soft.       ASSESSMENT AND PLAN:   (HFpEF) heart  failure with preserved ejection fraction (HCC) NYHA IIb-III.  Volume status appears stable.  I suspect her shortness of breath is multifactorial.  She has a lot of allergy symptoms currently.  She has had a chronic cough since he had COVID.  Valvular heart disease, coronary artery disease, deconditioning are also contributing.  We discussed advancing GDMT for HFpEF.  She was given an SGLT2 inhibitor in the past.  She had to stop this due to cost.  I will provide her financial assistance forms today to see if we can get it covered for her.  I am also suspicious that she would be able to afford Entresto.  Continue furosemide 20 mg daily.  Stop potassium.  Start spironolactone 12.5 mg daily.  Obtain follow-up BMET today along with BMP and CBC with differential.  Obtain BMET weekly x 2 after starting spironolactone.  I have asked her to reach out to me if her breathing does not improve, or if it worsens.  Otherwise, she can follow-up with Dr. Excell Seltzer in September as planned.  Valvular heart disease s/p AVR in 2012 She has evidence of moderate bioprosthetic aortic valve stenosis.  Her transvalvular gradients on recent echocardiogram was similar to those noted at cardiac catheterization in March 2023.  As noted, I am advancing her GDMT for HFpEF.  I have asked her to reach out to me if her shortness of breath does not improve, or worsens.  In that case, I will have her follow-up with Dr. Excell Seltzer sooner to discuss options for management of her valvular heart disease.  If her symptoms improve, she will keep her follow-up with Dr. Excell Seltzer in September after her repeat echocardiogram.  Continue SBE prophylaxis.  Essential hypertension Blood pressure is markedly elevated.  She notes optimal blood pressures at home.  Start spironolactone 12.5 mg daily as noted.  Continue amlodipine 2.5 mg daily, Lasix 20 mg daily, irbesartan 75 mg daily, Imdur 30 mg daily.  Continue to monitor blood pressures at home.  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  Continue follow-up with EP as planned.  Coronary artery disease involving native coronary artery of native heart with angina pectoris [facility]) Cardiac catheterization in February 2023 demonstrated patent RIMA-RCA and mild nonobstructive disease elsewhere.  She does have jaw pain associated with shortness of breath when she exerts herself.  This has been overall stable.  Adjust GDMT for HFpEF.  Hopefully this will help improve some of her anginal symptoms.  Continue amlodipine 2.5 mg daily, Imdur 30 mg daily, as needed nitroglycerin.  Hyperlipidemia LDL in September 2023 was optimal at 68.  Continue simvastatin 40 mg daily, Zetia 10 mg daily.  Cough She had a cough since she had COVID earlier this year.  Obtain CBC with differential, chest x-ray.  If CBC and chest x-ray are unremarkable, she should follow-up with primary care.      Dispo:  Return in 20 weeks (on 10/20/2022) for Scheduled Follow Up, w/ Dr. Excell Seltzer.  Signed, Tereso Newcomer, PA-C

## 2022-06-02 ENCOUNTER — Other Ambulatory Visit: Payer: Self-pay

## 2022-06-02 ENCOUNTER — Ambulatory Visit
Admission: RE | Admit: 2022-06-02 | Discharge: 2022-06-02 | Disposition: A | Discharge status: Home / Self Care | Payer: HMO | Source: Ambulatory Visit | Attending: Physician Assistant | Admitting: Physician Assistant

## 2022-06-02 ENCOUNTER — Encounter: Payer: Self-pay | Admitting: Physician Assistant

## 2022-06-02 ENCOUNTER — Ambulatory Visit: Payer: HMO | Attending: Physician Assistant | Admitting: Physician Assistant

## 2022-06-02 ENCOUNTER — Other Ambulatory Visit (HOSPITAL_COMMUNITY): Payer: Self-pay

## 2022-06-02 VITALS — BP 160/60 | HR 82 | Ht 62.0 in | Wt 166.6 lb

## 2022-06-02 DIAGNOSIS — E118 Type 2 diabetes mellitus with unspecified complications: Secondary | ICD-10-CM | POA: Diagnosis not present

## 2022-06-02 DIAGNOSIS — I5032 Chronic diastolic (congestive) heart failure: Secondary | ICD-10-CM | POA: Diagnosis not present

## 2022-06-02 DIAGNOSIS — E782 Mixed hyperlipidemia: Secondary | ICD-10-CM | POA: Diagnosis not present

## 2022-06-02 DIAGNOSIS — R059 Cough, unspecified: Secondary | ICD-10-CM | POA: Insufficient documentation

## 2022-06-02 DIAGNOSIS — I509 Heart failure, unspecified: Secondary | ICD-10-CM | POA: Diagnosis not present

## 2022-06-02 DIAGNOSIS — I25119 Atherosclerotic heart disease of native coronary artery with unspecified angina pectoris: Secondary | ICD-10-CM

## 2022-06-02 DIAGNOSIS — I38 Endocarditis, valve unspecified: Secondary | ICD-10-CM

## 2022-06-02 DIAGNOSIS — I10 Essential (primary) hypertension: Secondary | ICD-10-CM

## 2022-06-02 DIAGNOSIS — I442 Atrioventricular block, complete: Secondary | ICD-10-CM | POA: Diagnosis not present

## 2022-06-02 MED ORDER — ISOSORBIDE MONONITRATE ER 30 MG PO TB24
30.0000 mg | ORAL_TABLET | Freq: Every day | ORAL | 3 refills | Status: DC
  Filled 2022-06-02: qty 90, 90d supply, fill #0
  Filled 2022-08-30: qty 90, 90d supply, fill #1
  Filled 2022-11-06: qty 90, 90d supply, fill #2
  Filled 2023-02-06: qty 90, 90d supply, fill #3

## 2022-06-02 MED ORDER — NITROGLYCERIN 0.4 MG SL SUBL
0.4000 mg | SUBLINGUAL_TABLET | SUBLINGUAL | 3 refills | Status: DC | PRN
  Filled 2022-06-02: qty 25, 8d supply, fill #0

## 2022-06-02 MED ORDER — EZETIMIBE 10 MG PO TABS
10.0000 mg | ORAL_TABLET | Freq: Every day | ORAL | 3 refills | Status: DC
  Filled 2022-06-02: qty 90, 90d supply, fill #0
  Filled 2022-08-30: qty 90, 90d supply, fill #1
  Filled 2022-11-06: qty 90, 90d supply, fill #2
  Filled 2023-02-06: qty 90, 90d supply, fill #3

## 2022-06-02 MED ORDER — FUROSEMIDE 20 MG PO TABS
20.0000 mg | ORAL_TABLET | Freq: Every day | ORAL | 3 refills | Status: DC
  Filled 2022-06-02: qty 90, 90d supply, fill #0

## 2022-06-02 MED ORDER — SPIRONOLACTONE 25 MG PO TABS
12.5000 mg | ORAL_TABLET | Freq: Every day | ORAL | 3 refills | Status: DC
  Filled 2022-06-02: qty 45, 90d supply, fill #0
  Filled 2022-08-30: qty 45, 90d supply, fill #1

## 2022-06-02 MED ORDER — SIMVASTATIN 40 MG PO TABS
40.0000 mg | ORAL_TABLET | Freq: Every day | ORAL | 3 refills | Status: DC
  Filled 2022-06-02: qty 90, 90d supply, fill #0
  Filled 2022-08-30: qty 90, 90d supply, fill #1
  Filled 2022-11-06: qty 90, 90d supply, fill #2
  Filled 2023-02-06: qty 90, 90d supply, fill #3

## 2022-06-02 MED ORDER — IRBESARTAN 150 MG PO TABS
75.0000 mg | ORAL_TABLET | Freq: Two times a day (BID) | ORAL | 3 refills | Status: DC
  Filled 2022-06-02 – 2022-08-05 (×2): qty 90, 90d supply, fill #0
  Filled 2022-08-30 – 2022-11-06 (×2): qty 90, 90d supply, fill #1
  Filled 2023-01-23: qty 90, 90d supply, fill #2
  Filled 2023-04-15 – 2023-04-22 (×2): qty 90, 90d supply, fill #3

## 2022-06-02 MED ORDER — AMLODIPINE BESYLATE 2.5 MG PO TABS
2.5000 mg | ORAL_TABLET | Freq: Every day | ORAL | 3 refills | Status: DC
  Filled 2022-06-02: qty 90, 90d supply, fill #0
  Filled 2022-08-30: qty 90, 90d supply, fill #1
  Filled 2022-11-06: qty 90, 90d supply, fill #2
  Filled 2023-02-06: qty 90, 90d supply, fill #3

## 2022-06-02 NOTE — Assessment & Plan Note (Signed)
Stat post pacemaker.  Continue follow-up with EP as planned.

## 2022-06-02 NOTE — Assessment & Plan Note (Addendum)
Blood pressure is markedly elevated.  She notes optimal blood pressures at home.  Start spironolactone 12.5 mg daily as noted.  Continue amlodipine 2.5 mg daily, Lasix 20 mg daily, irbesartan 75 mg daily, Imdur 30 mg daily.  Continue to monitor blood pressures at home.

## 2022-06-02 NOTE — Assessment & Plan Note (Addendum)
She has evidence of moderate bioprosthetic aortic valve stenosis.  Her transvalvular gradients on recent echocardiogram was similar to those noted at cardiac catheterization in March 2023.  As noted, I am advancing her GDMT for HFpEF.  I have asked her to reach out to me if her shortness of breath does not improve, or worsens.  In that case, I will have her follow-up with Dr. Excell Seltzer sooner to discuss options for management of her valvular heart disease.  If her symptoms improve, she will keep her follow-up with Dr. Excell Seltzer in September after her repeat echocardiogram.  Continue SBE prophylaxis.

## 2022-06-02 NOTE — Patient Instructions (Signed)
Medication Instructions:  Your physician has recommended you make the following change in your medication:   STOP Potassium  START Spironolactone 25 mg taking 1/2 tablet = 12.5 mg daily   *If you need a refill on your cardiac medications before your next appointment, please call your pharmacy*   Lab Work: TODAY:  BMET, CBC W/DIFF, & PRO BNP  06/09/22:  BMET (anytime after 7:15 a.m)  06/16/22:  BMET (anytime after 7:15 a.m)  If you have labs (blood work) drawn today and your tests are completely normal, you will receive your results only by: MyChart Message (if you have MyChart) OR A paper copy in the mail If you have any lab test that is abnormal or we need to change your treatment, we will call you to review the results.   Testing/Procedures: A chest x-ray takes a picture of the organs and structures inside the chest, including the heart, lungs, and blood vessels. This test can show several things, including, whether the heart is enlarges; whether fluid is building up in the lungs; and whether pacemaker / defibrillator leads are still in place. Go to [facility] TODAY [address] [place] [phone]  Follow-Up: At [facility], you and your health needs are our priority.  As part of our continuing mission to provide you with exceptional heart care, we have created designated Provider Care Teams.  These Care Teams include your primary Cardiologist (physician) and Advanced Practice Providers (APPs -  Physician Assistants and Nurse Practitioners) who all work together to provide you with the care you need, when you need it.  We recommend signing up for the patient portal called "MyChart".  Sign up information is provided on this After Visit Summary.  MyChart is used to connect with patients for Virtual Visits (Telemedicine).  Patients are able to view lab/test results, encounter notes, upcoming appointments, etc.  Non-urgent messages can be sent to your  provider as well.   To learn more about what you can do with MyChart, go to [email or web address].    Your next appointment:   As scheduled   Provider:   Tonny Bollman, MD     Other Instructions  Follow up with your PCP for your cough

## 2022-06-02 NOTE — Assessment & Plan Note (Signed)
She had a cough since she had COVID earlier this year.  Obtain CBC with differential, chest x-ray.  If CBC and chest x-ray are unremarkable, she should follow-up with primary care.

## 2022-06-02 NOTE — Assessment & Plan Note (Addendum)
NYHA IIb-III.  Volume status appears stable.  I suspect her shortness of breath is multifactorial.  She has a lot of allergy symptoms currently.  She has had a chronic cough since he had COVID.  Valvular heart disease, coronary artery disease, deconditioning are also contributing.  We discussed advancing GDMT for HFpEF.  She was given an SGLT2 inhibitor in the past.  She had to stop this due to cost.  I will provide her financial assistance forms today to see if we can get it covered for her.  I am also suspicious that she would be able to afford Entresto.  Continue furosemide 20 mg daily.  Stop potassium.  Start spironolactone 12.5 mg daily.  Obtain follow-up BMET today along with BMP and CBC with differential.  Obtain BMET weekly x 2 after starting spironolactone.  I have asked her to reach out to me if her breathing does not improve, or if it worsens.  Otherwise, she can follow-up with Dr. Excell Seltzer in September as planned.

## 2022-06-02 NOTE — Assessment & Plan Note (Signed)
LDL in September 2023 was optimal at 68.  Continue simvastatin 40 mg daily, Zetia 10 mg daily.

## 2022-06-02 NOTE — Assessment & Plan Note (Signed)
Cardiac catheterization in February 2023 demonstrated patent RIMA-RCA and mild nonobstructive disease elsewhere.  She does have jaw pain associated with shortness of breath when she exerts herself.  This has been overall stable.  Adjust GDMT for HFpEF.  Hopefully this will help improve some of her anginal symptoms.  Continue amlodipine 2.5 mg daily, Imdur 30 mg daily, as needed nitroglycerin.

## 2022-06-04 LAB — CBC WITH DIFFERENTIAL/PLATELET
Basophils Absolute: 0.1 10*3/uL (ref 0.0–0.2)
Basos: 1 %
EOS (ABSOLUTE): 0.1 10*3/uL (ref 0.0–0.4)
Eos: 2 %
Hematocrit: 35.7 % (ref 34.0–46.6)
Hemoglobin: 11.7 g/dL (ref 11.1–15.9)
Immature Grans (Abs): 0 10*3/uL (ref 0.0–0.1)
Immature Granulocytes: 0 %
Lymphocytes Absolute: 2 10*3/uL (ref 0.7–3.1)
Lymphs: 29 %
MCH: 29.4 pg (ref 26.6–33.0)
MCHC: 32.8 g/dL (ref 31.5–35.7)
MCV: 90 fL (ref 79–97)
Monocytes Absolute: 0.5 10*3/uL (ref 0.1–0.9)
Monocytes: 8 %
Neutrophils Absolute: 4.1 10*3/uL (ref 1.4–7.0)
Neutrophils: 60 %
Platelets: 229 10*3/uL (ref 150–450)
RBC: 3.98 x10E6/uL (ref 3.77–5.28)
RDW: 13.1 % (ref 11.7–15.4)
WBC: 6.7 10*3/uL (ref 3.4–10.8)

## 2022-06-04 LAB — BASIC METABOLIC PANEL
BUN/Creatinine Ratio: 19 (ref 12–28)
BUN: 15 mg/dL (ref 8–27)
CO2: 23 mmol/L (ref 20–29)
Calcium: 8.9 mg/dL (ref 8.7–10.3)
Chloride: 100 mmol/L (ref 96–106)
Creatinine, Ser: 0.81 mg/dL (ref 0.57–1.00)
Glucose: 122 mg/dL — ABNORMAL HIGH (ref 70–99)
Potassium: 4.8 mmol/L (ref 3.5–5.2)
Sodium: 139 mmol/L (ref 134–144)
eGFR: 74 mL/min/{1.73_m2} (ref >59)

## 2022-06-04 LAB — PRO B NATRIURETIC PEPTIDE: NT-Pro BNP: 850 pg/mL — ABNORMAL HIGH (ref 0–738)

## 2022-06-08 ENCOUNTER — Telehealth: Payer: Self-pay | Admitting: Internal Medicine

## 2022-06-08 NOTE — Telephone Encounter (Signed)
Contacted Heather Crawford to schedule their annual wellness visit. Patient declined to schedule AWV at this time. Had it done with INS company.  The [facility] Care Guide [facility] AWV TEAM Direct Dial: [phone]

## 2022-06-09 ENCOUNTER — Ambulatory Visit: Payer: HMO | Attending: Physician Assistant

## 2022-06-09 DIAGNOSIS — R059 Cough, unspecified: Secondary | ICD-10-CM

## 2022-06-09 DIAGNOSIS — I5032 Chronic diastolic (congestive) heart failure: Secondary | ICD-10-CM | POA: Diagnosis not present

## 2022-06-09 DIAGNOSIS — I38 Endocarditis, valve unspecified: Secondary | ICD-10-CM | POA: Diagnosis not present

## 2022-06-09 LAB — BASIC METABOLIC PANEL
BUN/Creatinine Ratio: 16 (ref 12–28)
BUN: 14 mg/dL (ref 8–27)
CO2: 26 mmol/L (ref 20–29)
Calcium: 9.2 mg/dL (ref 8.7–10.3)
Chloride: 101 mmol/L (ref 96–106)
Creatinine, Ser: 0.88 mg/dL (ref 0.57–1.00)
Glucose: 101 mg/dL — ABNORMAL HIGH (ref 70–99)
Potassium: 5.3 mmol/L — ABNORMAL HIGH (ref 3.5–5.2)
Sodium: 140 mmol/L (ref 134–144)
eGFR: 67 mL/min/{1.73_m2} (ref >59)

## 2022-06-11 ENCOUNTER — Other Ambulatory Visit: Payer: Self-pay

## 2022-06-11 DIAGNOSIS — Z79899 Other long term (current) drug therapy: Secondary | ICD-10-CM

## 2022-06-12 ENCOUNTER — Ambulatory Visit: Payer: HMO | Attending: Cardiovascular Disease

## 2022-06-12 ENCOUNTER — Telehealth: Payer: Self-pay | Admitting: Physician Assistant

## 2022-06-12 DIAGNOSIS — Z79899 Other long term (current) drug therapy: Secondary | ICD-10-CM

## 2022-06-12 LAB — BASIC METABOLIC PANEL
BUN/Creatinine Ratio: 20 (ref 12–28)
BUN: 18 mg/dL (ref 8–27)
CO2: 24 mmol/L (ref 20–29)
Calcium: 8.9 mg/dL (ref 8.7–10.3)
Chloride: 99 mmol/L (ref 96–106)
Creatinine, Ser: 0.88 mg/dL (ref 0.57–1.00)
Glucose: 111 mg/dL — ABNORMAL HIGH (ref 70–99)
Potassium: 5 mmol/L (ref 3.5–5.2)
Sodium: 136 mmol/L (ref 134–144)
eGFR: 67 mL/min/{1.73_m2} (ref >59)

## 2022-06-12 NOTE — Telephone Encounter (Signed)
**Note De-Identified Marijose Curington Obfuscation** I completed the providers page of the pts [place] and [place] pt asst application for [employer], Dr Excell Seltzer has signed and dated it and I have faxed all to [facility] and Me and did receive confirmation that the fax was successful.

## 2022-06-12 NOTE — Telephone Encounter (Signed)
Patient dropped off patient assistance paperwork for Dr. Alben Spittle to complete and fax  Thank you

## 2022-06-12 NOTE — Telephone Encounter (Signed)
Gave pt's patient assistance paperwork to [place], LPN.

## 2022-06-16 ENCOUNTER — Ambulatory Visit: Payer: HMO | Attending: Physician Assistant

## 2022-06-16 DIAGNOSIS — I5032 Chronic diastolic (congestive) heart failure: Secondary | ICD-10-CM

## 2022-06-16 DIAGNOSIS — I38 Endocarditis, valve unspecified: Secondary | ICD-10-CM | POA: Diagnosis not present

## 2022-06-16 DIAGNOSIS — R059 Cough, unspecified: Secondary | ICD-10-CM

## 2022-06-17 ENCOUNTER — Telehealth: Payer: Self-pay | Admitting: *Deleted

## 2022-06-17 DIAGNOSIS — Z79899 Other long term (current) drug therapy: Secondary | ICD-10-CM

## 2022-06-17 LAB — BASIC METABOLIC PANEL
BUN/Creatinine Ratio: 27 (ref 12–28)
BUN: 24 mg/dL (ref 8–27)
CO2: 23 mmol/L (ref 20–29)
Calcium: 9.3 mg/dL (ref 8.7–10.3)
Chloride: 101 mmol/L (ref 96–106)
Creatinine, Ser: 0.89 mg/dL (ref 0.57–1.00)
Glucose: 119 mg/dL — ABNORMAL HIGH (ref 70–99)
Potassium: 5.1 mmol/L (ref 3.5–5.2)
Sodium: 139 mmol/L (ref 134–144)
eGFR: 66 mL/min/{1.73_m2} (ref >59)

## 2022-06-17 NOTE — Telephone Encounter (Signed)
-----   Message from Beatrice Lecher, [place] sent at 06/17/2022  2:45 PM EDT ----- Results sent to [name] via MyChart. See MyChart comments below. PLAN:  -Repeat BMET 1 week  Ms. Armendarez  Your creatinine (kidney function), potassium are normal. Continue current medications/treatment plan and follow up as scheduled. I will repeat your labs again in 1 week.  Tereso Newcomer, PA-C

## 2022-06-24 ENCOUNTER — Ambulatory Visit: Payer: HMO | Attending: Cardiovascular Disease

## 2022-06-24 DIAGNOSIS — Z79899 Other long term (current) drug therapy: Secondary | ICD-10-CM

## 2022-06-25 LAB — BASIC METABOLIC PANEL
BUN/Creatinine Ratio: 20 (ref 12–28)
BUN: 19 mg/dL (ref 8–27)
CO2: 24 mmol/L (ref 20–29)
Calcium: 9.1 mg/dL (ref 8.7–10.3)
Chloride: 100 mmol/L (ref 96–106)
Creatinine, Ser: 0.94 mg/dL (ref 0.57–1.00)
Glucose: 126 mg/dL — ABNORMAL HIGH (ref 70–99)
Potassium: 5.5 mmol/L — ABNORMAL HIGH (ref 3.5–5.2)
Sodium: 138 mmol/L (ref 134–144)
eGFR: 62 mL/min/{1.73_m2} (ref >59)

## 2022-06-25 NOTE — Telephone Encounter (Signed)
Patient states she can do blood work on 5/28 and will need orders for the blood test.

## 2022-06-25 NOTE — Addendum Note (Signed)
Addended by: Judene Companion on: 06/25/2022 04:25 PM   Modules accepted: Orders

## 2022-06-25 NOTE — Telephone Encounter (Signed)
Patient aware lab is ordered and appointment scheduled

## 2022-06-30 ENCOUNTER — Ambulatory Visit: Payer: HMO | Attending: Physician Assistant

## 2022-06-30 DIAGNOSIS — Z79899 Other long term (current) drug therapy: Secondary | ICD-10-CM

## 2022-07-01 LAB — BASIC METABOLIC PANEL
BUN/Creatinine Ratio: 17 (ref 12–28)
BUN: 16 mg/dL (ref 8–27)
CO2: 25 mmol/L (ref 20–29)
Calcium: 9 mg/dL (ref 8.7–10.3)
Chloride: 101 mmol/L (ref 96–106)
Creatinine, Ser: 0.94 mg/dL (ref 0.57–1.00)
Glucose: 106 mg/dL — ABNORMAL HIGH (ref 70–99)
Potassium: 5 mmol/L (ref 3.5–5.2)
Sodium: 139 mmol/L (ref 134–144)
eGFR: 62 mL/min/{1.73_m2} (ref >59)

## 2022-07-06 ENCOUNTER — Other Ambulatory Visit: Payer: Self-pay

## 2022-07-06 ENCOUNTER — Ambulatory Visit (INDEPENDENT_AMBULATORY_CARE_PROVIDER_SITE_OTHER): Payer: HMO | Admitting: Internal Medicine

## 2022-07-06 ENCOUNTER — Encounter: Payer: Self-pay | Admitting: Internal Medicine

## 2022-07-06 ENCOUNTER — Other Ambulatory Visit (HOSPITAL_COMMUNITY): Payer: Self-pay

## 2022-07-06 VITALS — BP 142/62 | HR 60 | Temp 98.2°F | Resp 16 | Ht 62.0 in | Wt 166.0 lb

## 2022-07-06 DIAGNOSIS — I10 Essential (primary) hypertension: Secondary | ICD-10-CM | POA: Diagnosis not present

## 2022-07-06 DIAGNOSIS — N1831 Chronic kidney disease, stage 3a: Secondary | ICD-10-CM

## 2022-07-06 DIAGNOSIS — G478 Other sleep disorders: Secondary | ICD-10-CM | POA: Insufficient documentation

## 2022-07-06 DIAGNOSIS — I700 Atherosclerosis of aorta: Secondary | ICD-10-CM

## 2022-07-06 DIAGNOSIS — Z0001 Encounter for general adult medical examination with abnormal findings: Secondary | ICD-10-CM

## 2022-07-06 DIAGNOSIS — D0439 Carcinoma in situ of skin of other parts of face: Secondary | ICD-10-CM

## 2022-07-06 DIAGNOSIS — E118 Type 2 diabetes mellitus with unspecified complications: Secondary | ICD-10-CM

## 2022-07-06 DIAGNOSIS — R058 Other specified cough: Secondary | ICD-10-CM | POA: Insufficient documentation

## 2022-07-06 DIAGNOSIS — J301 Allergic rhinitis due to pollen: Secondary | ICD-10-CM

## 2022-07-06 LAB — MICROALBUMIN / CREATININE URINE RATIO
Creatinine,U: 43.9 mg/dL
Microalb Creat Ratio: 2.5 mg/g (ref 0.0–30.0)
Microalb, Ur: 1.1 mg/dL (ref 0.0–1.9)

## 2022-07-06 LAB — URINALYSIS, ROUTINE W REFLEX MICROSCOPIC
Bilirubin Urine: NEGATIVE
Hgb urine dipstick: NEGATIVE
Ketones, ur: NEGATIVE
Nitrite: NEGATIVE
Specific Gravity, Urine: <=1.005 — AB (ref 1.000–1.030)
Total Protein, Urine: NEGATIVE
Urine Glucose: NEGATIVE
Urobilinogen, UA: 0.2 (ref 0.0–1.0)
pH: 6 (ref 5.0–8.0)

## 2022-07-06 LAB — TSH: TSH: 3.03 u[IU]/mL (ref 0.35–5.50)

## 2022-07-06 LAB — HEMOGLOBIN A1C: Hgb A1c MFr Bld: 6.8 % — ABNORMAL HIGH (ref 4.6–6.5)

## 2022-07-06 MED ORDER — AZELASTINE HCL 0.1 % NA SOLN
2.0000 | Freq: Two times a day (BID) | NASAL | 1 refills | Status: DC
Start: 2022-07-06 — End: 2022-12-02
  Filled 2022-07-06: qty 90, 75d supply, fill #0
  Filled 2022-09-18: qty 90, 75d supply, fill #1

## 2022-07-06 MED ORDER — MONTELUKAST SODIUM 10 MG PO TABS
10.0000 mg | ORAL_TABLET | Freq: Every day | ORAL | 1 refills | Status: DC
Start: 2022-07-06 — End: 2022-12-02
  Filled 2022-07-06: qty 90, 90d supply, fill #0
  Filled 2022-09-18: qty 90, 90d supply, fill #1

## 2022-07-06 NOTE — Progress Notes (Unsigned)
Subjective:  Patient ID: Heather Crawford, female    DOB: [birth year]  Age: 77 y.o. MRN: [id number]  CC: No chief complaint on file.   HPI DEVORY DORNBUSCH presents for ***  Outpatient Medications Prior to Visit  Medication Sig Dispense Refill   acetaminophen (TYLENOL) 325 MG tablet Take 1-2 tablets (325-650 mg total) by mouth every 4 (four) hours as needed for mild pain.     amLODipine (NORVASC) 2.5 MG tablet Take 1 tablet (2.5 mg total) by mouth daily. 90 tablet 3   aspirin EC 81 MG tablet Take 1 tablet (81 mg total) by mouth daily. Swallow whole. 90 tablet 3   Cholecalciferol 2000 units TABS Take 1 tablet (2,000 Units total) by mouth daily. 90 tablet 1   ezetimibe (ZETIA) 10 MG tablet Take 1 tablet (10 mg total) by mouth daily. 90 tablet 3   fluticasone (FLONASE) 50 MCG/ACT nasal spray USE 1 SPRAY IN EACH NOSTRIL EVERY DAY 32 g 5   furosemide (LASIX) 20 MG tablet Take 1 tablet (20 mg total) by mouth daily. 90 tablet 3   irbesartan (AVAPRO) 150 MG tablet Take 0.5 tablets (75 mg total) by mouth in the morning and at bedtime. 90 tablet 3   isosorbide mononitrate (IMDUR) 30 MG 24 hr tablet Take 1 tablet (30 mg total) by mouth daily. 90 tablet 3   meclizine (ANTIVERT) 25 MG tablet Take 25 mg by mouth 3 (three) times daily as needed for dizziness.     nitroGLYCERIN (NITROSTAT) 0.4 MG SL tablet Place 1 tablet (0.4 mg total) under the tongue every 5 (five) minutes as needed for chest pain. 25 tablet 3   omeprazole (PRILOSEC) 40 MG capsule TAKE 1 CAPSULE EVERY DAY 90 capsule 1   simvastatin (ZOCOR) 40 MG tablet Take 1 tablet (40 mg total) by mouth daily. 90 tablet 3   spironolactone (ALDACTONE) 25 MG tablet Take 0.5 tablets (12.5 mg total) by mouth daily. 45 tablet 3   montelukast (SINGULAIR) 10 MG tablet TAKE 1 TABLET AT BEDTIME 90 tablet 1   No facility-administered medications prior to visit.    ROS Review of Systems  Objective:  BP (!) 142/62 (BP Location: Left Arm, Patient  Position: Sitting, Cuff Size: Large)   Pulse 60   Temp 98.2 F (36.8 C) (Oral)   Resp 16   Ht 5\' 2"  (1.575 m)   Wt 166 lb (75.3 kg)   SpO2 93%   BMI 30.36 kg/m   BP Readings from Last 3 Encounters:  07/06/22 (!) 142/62  06/02/22 (!) 160/60  03/17/22 134/66    Wt Readings from Last 3 Encounters:  07/06/22 166 lb (75.3 kg)  06/02/22 166 lb 9.6 oz (75.6 kg)  03/17/22 169 lb 3.2 oz (76.7 kg)    Physical Exam  Lab Results  Component Value Date   WBC 6.7 06/02/2022   HGB 11.7 06/02/2022   HCT 35.7 06/02/2022   PLT 229 06/02/2022   GLUCOSE 106 (H) 06/30/2022   CHOL 149 10/07/2021   TRIG 185 (H) 10/07/2021   HDL 50 10/07/2021   LDLDIRECT 76.0 06/24/2020   LDLCALC 68 10/07/2021   ALT 17 10/07/2021   AST 19 10/07/2021   NA 139 06/30/2022   K 5.0 06/30/2022   CL 101 06/30/2022   CREATININE 0.94 06/30/2022   BUN 16 06/30/2022   CO2 25 06/30/2022   TSH 5.37 07/02/2021   INR 3.1 06/18/2010   HGBA1C 7.1 (H) 07/02/2021   MICROALBUR 1.1 07/02/2021  DG Chest 2 View  Result Date: 06/05/2022 CLINICAL DATA:  Valvular heart disease. Chronic heart failure with preserved ejection fraction. Cough, unspecified. Prolonged cough. Patient reports COVID infection most month. EXAM: CHEST - 2 VIEW COMPARISON:  Chest radiograph 01/18/2020 FINDINGS: Prior median sternotomy. Left-sided pacemaker in place. Prosthetic cardiac valve. The heart is normal in size. Unchanged mediastinal contours with aortic atherosclerosis. There is no focal airspace disease or evidence of pneumonia. Normal pulmonary vasculature. No pleural fluid. No pneumothorax. No acute osseous findings. IMPRESSION: No acute cardiopulmonary disease.  No explanation for symptoms. Electronically Signed   By: Narda Rutherford M.D.   On: 06/05/2022 17:02    Assessment & Plan:  Atherosclerosis of aorta (HCC)  Encounter for general adult medical examination with abnormal findings  Type II diabetes mellitus with manifestations  (HCC) -     Microalbumin / creatinine urine ratio; Future -     Urinalysis, Routine w reflex microscopic; Future -     Hemoglobin A1c; Future  Essential hypertension -     Urinalysis, Routine w reflex microscopic; Future -     TSH; Future  Stage 3a chronic kidney disease (HCC)  Seasonal allergic rhinitis due to pollen -     Montelukast Sodium; Take 1 tablet (10 mg total) by mouth at bedtime.  Dispense: 90 tablet; Refill: 1 -     Azelastine HCl; Place 2 sprays into both nostrils 2 (two) times daily. Use in each nostril as directed  Dispense: 90 mL; Refill: 1  Upper airway cough syndrome -     Azelastine HCl; Place 2 sprays into both nostrils 2 (two) times daily. Use in each nostril as directed  Dispense: 90 mL; Refill: 1     Follow-up: Return in about 6 months (around 01/05/2023).  Sanda Linger, MD

## 2022-07-06 NOTE — Patient Instructions (Signed)

## 2022-07-07 ENCOUNTER — Encounter: Payer: Self-pay | Admitting: Pharmacist

## 2022-07-07 ENCOUNTER — Other Ambulatory Visit: Payer: Self-pay

## 2022-07-07 ENCOUNTER — Other Ambulatory Visit (HOSPITAL_COMMUNITY): Payer: Self-pay

## 2022-07-08 ENCOUNTER — Other Ambulatory Visit (HOSPITAL_COMMUNITY): Payer: Self-pay

## 2022-07-16 ENCOUNTER — Ambulatory Visit (INDEPENDENT_AMBULATORY_CARE_PROVIDER_SITE_OTHER): Payer: HMO

## 2022-07-16 DIAGNOSIS — I442 Atrioventricular block, complete: Secondary | ICD-10-CM

## 2022-07-16 LAB — CUP PACEART REMOTE DEVICE CHECK
Battery Remaining Longevity: 152 mo
Battery Voltage: 3.03 V
Brady Statistic AP VP Percent: 0.03 %
Brady Statistic AP VS Percent: 13.16 %
Brady Statistic AS VP Percent: 0.03 %
Brady Statistic AS VS Percent: 86.78 %
Brady Statistic RA Percent Paced: 13.36 %
Brady Statistic RV Percent Paced: 0.06 %
Date Time Interrogation Session: 20240613072844
Implantable Lead Connection Status: 753985
Implantable Lead Connection Status: 753985
Implantable Lead Implant Date: 20211215
Implantable Lead Implant Date: 20211215
Implantable Lead Location: 753859
Implantable Lead Location: 753860
Implantable Lead Model: 5076
Implantable Lead Model: 5076
Implantable Pulse Generator Implant Date: 20211215
Lead Channel Impedance Value: 285 Ohm
Lead Channel Impedance Value: 361 Ohm
Lead Channel Impedance Value: 646 Ohm
Lead Channel Impedance Value: 703 Ohm
Lead Channel Pacing Threshold Amplitude: 0.5 V
Lead Channel Pacing Threshold Amplitude: 0.5 V
Lead Channel Pacing Threshold Pulse Width: 0.4 ms
Lead Channel Pacing Threshold Pulse Width: 0.4 ms
Lead Channel Sensing Intrinsic Amplitude: 1.25 mV
Lead Channel Sensing Intrinsic Amplitude: 1.25 mV
Lead Channel Sensing Intrinsic Amplitude: 16.625 mV
Lead Channel Sensing Intrinsic Amplitude: 16.625 mV
Lead Channel Setting Pacing Amplitude: 1.5 V
Lead Channel Setting Pacing Amplitude: 2 V
Lead Channel Setting Pacing Pulse Width: 0.4 ms
Lead Channel Setting Sensing Sensitivity: 1.2 mV
Zone Setting Status: 755011

## 2022-08-05 ENCOUNTER — Other Ambulatory Visit (HOSPITAL_COMMUNITY): Payer: Self-pay

## 2022-08-05 ENCOUNTER — Other Ambulatory Visit: Payer: Self-pay

## 2022-08-05 NOTE — Progress Notes (Signed)
Remote pacemaker transmission.   

## 2022-08-20 ENCOUNTER — Ambulatory Visit (INDEPENDENT_AMBULATORY_CARE_PROVIDER_SITE_OTHER): Payer: HMO | Admitting: Dermatology

## 2022-08-20 ENCOUNTER — Encounter: Payer: Self-pay | Admitting: Dermatology

## 2022-08-20 VITALS — BP 108/78

## 2022-08-20 DIAGNOSIS — L578 Other skin changes due to chronic exposure to nonionizing radiation: Secondary | ICD-10-CM | POA: Diagnosis not present

## 2022-08-20 DIAGNOSIS — L814 Other melanin hyperpigmentation: Secondary | ICD-10-CM | POA: Diagnosis not present

## 2022-08-20 DIAGNOSIS — L820 Inflamed seborrheic keratosis: Secondary | ICD-10-CM

## 2022-08-20 DIAGNOSIS — W908XXA Exposure to other nonionizing radiation, initial encounter: Secondary | ICD-10-CM | POA: Diagnosis not present

## 2022-08-20 DIAGNOSIS — Z1283 Encounter for screening for malignant neoplasm of skin: Secondary | ICD-10-CM | POA: Diagnosis not present

## 2022-08-20 DIAGNOSIS — L570 Actinic keratosis: Secondary | ICD-10-CM

## 2022-08-20 DIAGNOSIS — L821 Other seborrheic keratosis: Secondary | ICD-10-CM

## 2022-08-20 DIAGNOSIS — L918 Other hypertrophic disorders of the skin: Secondary | ICD-10-CM

## 2022-08-20 DIAGNOSIS — Z85828 Personal history of other malignant neoplasm of skin: Secondary | ICD-10-CM | POA: Diagnosis not present

## 2022-08-20 DIAGNOSIS — Z872 Personal history of diseases of the skin and subcutaneous tissue: Secondary | ICD-10-CM

## 2022-08-20 DIAGNOSIS — D1801 Hemangioma of skin and subcutaneous tissue: Secondary | ICD-10-CM

## 2022-08-20 NOTE — Patient Instructions (Addendum)
Thank you for visiting my office today. I appreciate your commitment to maintaining your skin health and addressing your concerns promptly. Here is a summary of our key points from today's visit:  - Skin Examination: We conducted a thorough examination of your sun-exposed areas, including your face, chest, back, and legs, focusing on areas with a history of skin cancer.  - Treatment of Pre-cancers: We identified and treated several pink gritty papules on your forehead and temples with cryotherapy (cold spray) to prevent them from developing into squamous cell carcinoma.  - Seborrheic Keratosis: We treated a seborrheic keratosis on your upper arm to prevent further irritation, especially from your grandson. It is recommended to keep this area covered with a band-aid during the healing process.  - Monitoring: Please monitor the treated areas for any changes, and keep an eye on other spots, particularly under your bra, as discussed. If there are any changes or if they become irritated, do not hesitate to contact us.  - General Skin Care Advice: Continue to protect your skin from sun exposure and perform regular self-examinations to monitor for new or changing lesions.  Please feel free to reach out if you have any further questions or concerns. We look forward to seeing you for a follow-up as needed to ensure your skin remains healthy.      Due to recent changes in healthcare laws, you may see results of your pathology and/or laboratory studies on MyChart before the doctors have had a chance to review them. We understand that in some cases there may be results that are confusing or concerning to you. Please understand that not all results are received at the same time and often the doctors may need to interpret multiple results in order to provide you with the best plan of care or course of treatment. Therefore, we ask that you please give [place] 2 business days to thoroughly review all your results  before contacting the office for clarification. Should we see a critical lab result, you will be contacted sooner.   If You Need Anything After Your Visit  If you have any questions or concerns for your doctor, please call our main line at [phone] If no one answers, please leave a voicemail as directed and we will return your call as soon as possible. Messages left after 4 pm will be answered the following business day.   You may also send [place] a message via MyChart. We typically respond to MyChart messages within 1-2 business days.  For prescription refills, please ask your pharmacy to contact our office. Our fax number is [phone].  If you have an urgent issue when the clinic is closed that cannot wait until the next business day, you can page your doctor at the number below.    Please note that while we do our best to be available for urgent issues outside of office hours, we are not available 24/7.   If you have an urgent issue and are unable to reach [place], you may choose to seek medical care at your doctor's office, retail clinic, urgent care center, or emergency room.  If you have a medical emergency, please immediately call 911 or go to the emergency department. In the event of inclement weather, please call our main line at [phone] for an update on the status of any delays or closures.  Dermatology Medication Tips: Please keep the boxes that topical medications come in in order to help keep track of the instructions about where and  how to use these. Pharmacies typically print the medication instructions only on the boxes and not directly on the medication tubes.   If your medication is too expensive, please contact our office at [phone] or send [place] a message through MyChart.   We are unable to tell what your co-pay for medications will be in advance as this is different depending on your insurance coverage. However, we may be able to find a substitute medication at  lower cost or fill out paperwork to get insurance to cover a needed medication.   If a prior authorization is required to get your medication covered by your insurance company, please allow [place] 1-2 business days to complete this process.  Drug prices often vary depending on where the prescription is filled and some pharmacies may offer cheaper prices.  The website www.goodrx.com contains coupons for medications through different pharmacies. The prices here do not account for what the cost may be with help from insurance (it may be cheaper with your insurance), but the website can give you the price if you did not use any insurance.  - You can print the associated coupon and take it with your prescription to the pharmacy.  - You may also stop by our office during regular business hours and pick up a GoodRx coupon card.  - If you need your prescription sent electronically to a different pharmacy, notify our office through [facility] or by phone at [phone]

## 2022-08-20 NOTE — Progress Notes (Signed)
New Patient Visit   Subjective  Heather Crawford is a 77 y.o. female who presents for the following: She is concerned of  pink scaly growths on the face for 1-2 years. 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 Prior Pt of [employer]. Her last skin exam was 09/2019. She is complaining of a growth at the right arm x 2 years that gets caught on things.     The following portions of the chart were reviewed this encounter and updated as appropriate: medications, allergies, medical history  Review of Systems:  No other skin or systemic complaints except as noted in HPI or Assessment and Plan.  Objective  Well appearing patient in no apparent distress; mood and affect are within normal limits.      A focused examination was performed of the following areas: Face, Extremities, Torso on sun exposed areas  Relevant exam findings are noted in the Assessment and Plan.  Face (7) Erythematous thin papules/macules with gritty scale.   Neck - Anterior Pedunculated fleshy growth    Assessment & Plan  Well healed scar on the nasal root.  Pink gritty papules on the cheeks, and forehead Waxy brown stuck on papule on the right arms  Actinic damage  Lentigines on arms and face  Brown waxy papules on the back   Face (7) Erythematous thin papules/macules with gritty scale.   Neck - Anterior Pedunculated fleshy growth    Assessment & Plan   SKIN CANCER SCREENING PERFORMED TODAY.  ACTINIC DAMAGE - Chronic condition, secondary to cumulative UV/sun exposure - diffuse scaly erythematous macules with underlying dyspigmentation - Recommend daily broad spectrum sunscreen SPF 30+ to sun-exposed areas, reapply every 2 hours as needed.  - Staying in the shade or wearing long sleeves, sun glasses (UVA+UVB protection) and wide brim hats (4-inch brim around the entire circumference of the hat) are also recommended for sun  protection.  - Call for new or changing lesions.  LENTIGINES, SEBORRHEIC KERATOSES, HEMANGIOMAS - Benign normal skin lesions - Benign-appearing - Call for any changes  MELANOCYTIC NEVI - Tan-brown and/or pink-flesh-colored symmetric macules and papules - Benign appearing on exam today - Observation - Call clinic for new or changing moles - Recommend daily use of broad spectrum spf 30+ sunscreen to sun-exposed areas.     Inflamed seborrheic keratosis Right Upper Arm - Anterior  Actinic keratosis (7) Face  Destruction of lesion - Face (7) Complexity: simple   Destruction method: cryotherapy   Informed consent: discussed and consent obtained   Timeout:  patient name, date of birth, surgical site, and procedure verified Lesion destroyed using liquid nitrogen: Yes   Post-procedure details: wound care instructions given    Inflamed skin tag Neck - Anterior  Destruction Procedure Note Destruction method: cryotherapy   Informed consent: discussed and consent obtained   Lesion destroyed using liquid nitrogen: Yes   Outcome: patient tolerated procedure well with no complications   Post-procedure details: wound care instructions given   Locations: ant neck # of Lesions Treated: 1  Prior to procedure, discussed risks of blister formation, small wound, skin dyspigmentation, or rare scar following cryotherapy. Recommend Vaseline ointment to treated areas while healing.    Inflamed seborrheic keratosis Right Upper Arm - Anterior  Actinic keratosis (7) Face  Destruction of lesion - Face (7) Complexity: simple   Destruction method: cryotherapy   Informed consent: discussed and  consent obtained   Timeout:  patient name, date of birth, surgical site, and procedure verified Lesion destroyed using liquid nitrogen: Yes   Post-procedure details: wound care instructions given    Inflamed skin tag Neck - Anterior  Destruction Procedure Note Destruction method: cryotherapy    Informed consent: discussed and consent obtained   Lesion destroyed using liquid nitrogen: Yes   Outcome: patient tolerated procedure well with no complications   Post-procedure details: wound care instructions given   Locations: ant neck # of Lesions Treated: 1  Prior to procedure, discussed risks of blister formation, small wound, skin dyspigmentation, or rare scar following cryotherapy. Recommend Vaseline ointment to treated areas while healing.     Return in about 4 months (around 12/21/2022) for AK Follow Up.  Jaclynn Guarneri, CMA, am acting as scribe for [employer], DO.   Documentation: I have reviewed the above documentation for accuracy and completeness, and I agree with the above.  Langston Reusing, DO

## 2022-08-31 ENCOUNTER — Other Ambulatory Visit: Payer: Self-pay

## 2022-08-31 ENCOUNTER — Other Ambulatory Visit (HOSPITAL_COMMUNITY): Payer: Self-pay

## 2022-09-01 ENCOUNTER — Telehealth: Payer: Self-pay | Admitting: Radiology

## 2022-09-01 NOTE — Telephone Encounter (Signed)
Contacted Heather Crawford to schedule their annual wellness visit. Patient declined to schedule AWV at this time.  Laynie Espy K. CMA

## 2022-09-18 ENCOUNTER — Other Ambulatory Visit: Payer: Self-pay

## 2022-09-22 DIAGNOSIS — Z1231 Encounter for screening mammogram for malignant neoplasm of breast: Secondary | ICD-10-CM | POA: Diagnosis not present

## 2022-10-12 ENCOUNTER — Encounter: Payer: Self-pay | Admitting: Cardiology

## 2022-10-12 ENCOUNTER — Other Ambulatory Visit (HOSPITAL_COMMUNITY): Payer: Self-pay

## 2022-10-12 ENCOUNTER — Ambulatory Visit: Payer: HMO | Attending: Cardiology | Admitting: Cardiology

## 2022-10-12 VITALS — BP 120/68 | HR 68 | Ht 62.0 in | Wt 164.0 lb

## 2022-10-12 DIAGNOSIS — I442 Atrioventricular block, complete: Secondary | ICD-10-CM | POA: Diagnosis not present

## 2022-10-12 DIAGNOSIS — I2510 Atherosclerotic heart disease of native coronary artery without angina pectoris: Secondary | ICD-10-CM

## 2022-10-12 DIAGNOSIS — T82857D Stenosis of cardiac prosthetic devices, implants and grafts, subsequent encounter: Secondary | ICD-10-CM

## 2022-10-12 DIAGNOSIS — I10 Essential (primary) hypertension: Secondary | ICD-10-CM | POA: Diagnosis not present

## 2022-10-12 LAB — CUP PACEART INCLINIC DEVICE CHECK
Battery Remaining Longevity: 150 mo
Battery Voltage: 3.02 V
Brady Statistic AP VP Percent: 0.03 %
Brady Statistic AP VS Percent: 16.43 %
Brady Statistic AS VP Percent: 0.03 %
Brady Statistic AS VS Percent: 83.51 %
Brady Statistic RA Percent Paced: 16.58 %
Brady Statistic RV Percent Paced: 0.06 %
Date Time Interrogation Session: 20240909085528
Implantable Lead Connection Status: 753985
Implantable Lead Connection Status: 753985
Implantable Lead Implant Date: 20211215
Implantable Lead Implant Date: 20211215
Implantable Lead Location: 753859
Implantable Lead Location: 753860
Implantable Lead Model: 5076
Implantable Lead Model: 5076
Implantable Pulse Generator Implant Date: 20211215
Lead Channel Impedance Value: 323 Ohm
Lead Channel Impedance Value: 437 Ohm
Lead Channel Impedance Value: 646 Ohm
Lead Channel Impedance Value: 741 Ohm
Lead Channel Pacing Threshold Amplitude: 0.5 V
Lead Channel Pacing Threshold Amplitude: 0.5 V
Lead Channel Pacing Threshold Amplitude: 0.625 V
Lead Channel Pacing Threshold Pulse Width: 0.4 ms
Lead Channel Pacing Threshold Pulse Width: 0.4 ms
Lead Channel Pacing Threshold Pulse Width: 0.4 ms
Lead Channel Sensing Intrinsic Amplitude: 1 mV
Lead Channel Sensing Intrinsic Amplitude: 1.375 mV
Lead Channel Sensing Intrinsic Amplitude: 18.5 mV
Lead Channel Sensing Intrinsic Amplitude: 22.25 mV
Lead Channel Setting Pacing Amplitude: 1.5 V
Lead Channel Setting Pacing Amplitude: 2 V
Lead Channel Setting Pacing Pulse Width: 0.4 ms
Lead Channel Setting Sensing Sensitivity: 1.2 mV
Zone Setting Status: 755011

## 2022-10-12 NOTE — Progress Notes (Signed)
  Electrophysiology Office Note:   Date:  10/12/2022  ID:  Heather Crawford, [name] [birth year], MRN [id number]  Primary Cardiologist: Tonny Bollman, MD Electrophysiologist: Regan Lemming, MD      History of Present Illness:   Heather Crawford is a 77 y.o. female with h/o aortic stenosis, hypertension, coronary artery disease, heart block seen today for routine electrophysiology followup.   Since last being seen in our clinic the patient reports doing well.  She notes no further episodes of syncope.  He has had some fatigue.  She is going to have an echo this week to evaluate her aortic valve for possible replacement.  Aside from that she has no acute complaints.  she denies chest pain, palpitations, dyspnea, PND, orthopnea, nausea, vomiting, dizziness, syncope, edema, weight gain, or early satiety.   Review of systems complete and found to be negative unless listed in HPI.      EP Information / Studies Reviewed:    EKG is ordered today. Personal review as below.  EKG Interpretation Date/Time:  Monday October 12 2022 08:36:34 EDT Ventricular Rate:  68 PR Interval:  162 QRS Duration:  136 QT Interval:  430 QTC Calculation: 457 R Axis:   64  Text Interpretation: Normal sinus rhythm Left bundle branch block When compared with ECG of 12-Sep-2018 14:58, No significant change was found Confirmed by Corianne Buccellato ([id number]) on 10/12/2022 8:49:56 AM   PPM Interrogation-  reviewed in detail today,  See PACEART report.  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  Risk Assessment/Calculations:              Physical Exam:   VS:  BP 120/68   Pulse 68   Ht 5\' 2"  (1.575 m)   Wt 164 lb (74.4 kg)   SpO2 97%   BMI 30.00 kg/m    Wt Readings from Last 3 Encounters:  10/12/22 164 lb (74.4 kg)  07/06/22 166 lb (75.3 kg)  06/02/22 166 lb 9.6 oz (75.6 kg)     GEN: Well nourished, well developed in no acute distress NECK: No JVD; No  carotid bruits CARDIAC: Regular rate and rhythm, 2-3/6 systolic murmur at the base RESPIRATORY:  Clear to auscultation without rales, wheezing or rhonchi  ABDOMEN: Soft, non-tender, non-distended EXTREMITIES:  No edema; No deformity   ASSESSMENT AND PLAN:    1.  Syncope with complete heart block  s/p Medtronic PPM  Normal PPM function See Pace Art report No changes today  2.  Coronary artery disease: Post CABG x 1.  No current chest pain.  Plan per primary cardiology.  3.  Aortic stenosis: Post AVR.  Functioning normally on most recent echo.  Plan per primary cardiology  4.  Hypertension: Currently well-controlled  Disposition:   Follow up with Dr. Elberta Fortis in 12 months  Signed, Aryanna Shaver Jorja Loa, MD

## 2022-10-12 NOTE — Patient Instructions (Signed)

## 2022-10-14 ENCOUNTER — Ambulatory Visit (HOSPITAL_COMMUNITY): Payer: HMO | Attending: Cardiology

## 2022-10-14 DIAGNOSIS — I38 Endocarditis, valve unspecified: Secondary | ICD-10-CM | POA: Diagnosis not present

## 2022-10-14 LAB — ECHOCARDIOGRAM LIMITED
AR max vel: 0.96 cm2
AV Area VTI: 0.88 cm2
AV Area mean vel: 0.91 cm2
AV Mean grad: 35 mmHg
AV Peak grad: 57.8 mmHg
Ao pk vel: 3.8 m/s
Area-P 1/2: 1.84 cm2
S' Lateral: 3.4 cm

## 2022-10-15 ENCOUNTER — Ambulatory Visit (INDEPENDENT_AMBULATORY_CARE_PROVIDER_SITE_OTHER): Payer: HMO

## 2022-10-15 ENCOUNTER — Telehealth: Payer: Self-pay | Admitting: *Deleted

## 2022-10-15 DIAGNOSIS — Z79899 Other long term (current) drug therapy: Secondary | ICD-10-CM

## 2022-10-15 DIAGNOSIS — I442 Atrioventricular block, complete: Secondary | ICD-10-CM

## 2022-10-15 LAB — CUP PACEART REMOTE DEVICE CHECK
Battery Remaining Longevity: 149 mo
Battery Voltage: 3.02 V
Brady Statistic AP VP Percent: 0.12 %
Brady Statistic AP VS Percent: 12.48 %
Brady Statistic AS VP Percent: 0.03 %
Brady Statistic AS VS Percent: 87.37 %
Brady Statistic RA Percent Paced: 13.58 %
Brady Statistic RV Percent Paced: 0.15 %
Date Time Interrogation Session: 20240912094217
Implantable Lead Connection Status: 753985
Implantable Lead Connection Status: 753985
Implantable Lead Implant Date: 20211215
Implantable Lead Implant Date: 20211215
Implantable Lead Location: 753859
Implantable Lead Location: 753860
Implantable Lead Model: 5076
Implantable Lead Model: 5076
Implantable Pulse Generator Implant Date: 20211215
Lead Channel Impedance Value: 285 Ohm
Lead Channel Impedance Value: 380 Ohm
Lead Channel Impedance Value: 475 Ohm
Lead Channel Impedance Value: 532 Ohm
Lead Channel Pacing Threshold Amplitude: 0.5 V
Lead Channel Pacing Threshold Amplitude: 0.5 V
Lead Channel Pacing Threshold Pulse Width: 0.4 ms
Lead Channel Pacing Threshold Pulse Width: 0.4 ms
Lead Channel Sensing Intrinsic Amplitude: 1.875 mV
Lead Channel Sensing Intrinsic Amplitude: 1.875 mV
Lead Channel Sensing Intrinsic Amplitude: 18.375 mV
Lead Channel Sensing Intrinsic Amplitude: 18.375 mV
Lead Channel Setting Pacing Amplitude: 1.5 V
Lead Channel Setting Pacing Amplitude: 2 V
Lead Channel Setting Pacing Pulse Width: 0.4 ms
Lead Channel Setting Sensing Sensitivity: 1.2 mV
Zone Setting Status: 755011

## 2022-10-15 MED ORDER — FUROSEMIDE 20 MG PO TABS: 20.0000 mg | ORAL_TABLET | Freq: Every day | ORAL | Status: DC

## 2022-10-15 NOTE — Telephone Encounter (Signed)
-----   Message from Tereso Newcomer sent at 10/15/2022  9:21 AM EDT ----- Results sent to [name] via MyChart. See MyChart comments below. Will fwd to Dr. Excell Seltzer as Lorain Childes for appt next week. PLAN:  -Increase Lasix from as needed to 20 mg once daily every day -BMET at appt with Dr. Excell Seltzer next week.  Ms. Tolleson  Your echocardiogram shows normal ejection fraction (heart function). There is evidence of elevated pressure which is likely due to excess fluid. Your medication list shows Furosemide 20 mg once daily as needed. If you are only taking this as needed, I think we should change it to Furosemide 20 mg once daily every day. I will get a repeat lab (BMET) at your appt with Dr. Excell Seltzer next week. Your aortic valve measurements are just slightly worse but overall appear stable. Keep your appointment with Dr. Excell Seltzer next week. He can look at this with you and review it further.  Tereso Newcomer, PA-C

## 2022-10-20 ENCOUNTER — Ambulatory Visit: Payer: HMO | Attending: Cardiovascular Disease | Admitting: Cardiovascular Disease

## 2022-10-20 ENCOUNTER — Encounter: Payer: Self-pay | Admitting: Cardiovascular Disease

## 2022-10-20 ENCOUNTER — Other Ambulatory Visit: Payer: HMO

## 2022-10-20 VITALS — BP 130/70 | HR 76 | Ht 62.0 in | Wt 164.8 lb

## 2022-10-20 DIAGNOSIS — T82857D Stenosis of cardiac prosthetic devices, implants and grafts, subsequent encounter: Secondary | ICD-10-CM

## 2022-10-20 DIAGNOSIS — Z01810 Encounter for preprocedural cardiovascular examination: Secondary | ICD-10-CM

## 2022-10-20 DIAGNOSIS — I25119 Atherosclerotic heart disease of native coronary artery with unspecified angina pectoris: Secondary | ICD-10-CM

## 2022-10-20 NOTE — Patient Instructions (Signed)
Medication Instructions:  Your physician recommends that you continue on your current medications as directed. Please refer to the Current Medication list given to you today.  *If you need a refill on your cardiac medications before your next appointment, please call your pharmacy*  Lab Work: BMET today (previously ordered) CBC, BMET for cath procedure If you have labs (blood work) drawn today and your tests are completely normal, you will receive your results only by: MyChart Message (if you have MyChart) OR A paper copy in the mail If you have any lab test that is abnormal or we need to change your treatment, we will call you to review the results.  Testing/Procedures: R and L heart catheterization Your physician has requested that you have a cardiac catheterization. Cardiac catheterization is used to diagnose and/or treat various heart conditions. Doctors may recommend this procedure for a number of different reasons. The most common reason is to evaluate chest pain. Chest pain can be a symptom of coronary artery disease (CAD), and cardiac catheterization can show whether plaque is narrowing or blocking your heart's arteries. This procedure is also used to evaluate the valves, as well as measure the blood flow and oxygen levels in different parts of your heart. For further information please visit [email or web address] Please follow instruction sheet, as given.  Follow-Up: At [facility], you and your health needs are our priority.  As part of our continuing mission to provide you with exceptional heart care, we have created designated Provider Care Teams.  These Care Teams include your primary Cardiologist (physician) and Advanced Practice Providers (APPs -  Physician Assistants and Nurse Practitioners) who all work together to provide you with the care you need, when you need it.  Your next appointment:   Structural Team will follow-up  Provider:   Tonny Bollman, MD       Other Instructions       Cardiac/Peripheral Catheterization   You are scheduled for a Cardiac Catheterization on Thursday, October 24 with Dr. Tonny Bollman.  1. Please arrive at the [facility] (Main Entrance A) at [facility]: [address] [place], [place] [address] at 7:00 AM (This time is two hour(s) before your procedure to ensure your preparation). Free valet parking service is available. You will check in at ADMITTING. The support person will be asked to wait in the waiting room.  It is OK to have someone drop you off and come back when you are ready to be discharged.        Special note: Every effort is made to have your procedure done on time. Please understand that emergencies sometimes delay scheduled procedures.  2. Diet: Do not eat solid foods after midnight.  You may have clear liquids until 5 AM the day of the procedure.  3. Labs: You will need to have blood drawn on 11/19/22   4. Medication instructions in preparation for your procedure:   Contrast Allergy: No  DO NOT TAKE Furosemide the morning of procedure  On the morning of your procedure, take Aspirin 81 mg and any morning medicines NOT listed above.  You may use sips of water.  5. Plan to go home the same day, you will only stay overnight if medically necessary. 6. You MUST have a responsible adult to drive you home. 7. An adult MUST be with you the first 24 hours after you arrive home. 8. Bring a current list of your medications, and the last time and date medication taken. 9.  Bring ID and current insurance cards. 10.Please wear clothes that are easy to get on and off and wear slip-on shoes.  Thank you for allowing [place] to care for you!   -- [facility]

## 2022-10-20 NOTE — Progress Notes (Signed)
Cardiology Office Note:    Date:  10/20/2022   ID:  Heather Crawford, [name] 03/29/1945, MRN [id number]  PCP:  [name], MD   [facility] Providers Cardiologist:  Tonny Bollman, MD Cardiology APP:  Beatrice Lecher, PA-C  Electrophysiologist:  Will Jorja Loa, MD     Referring MD: [name], MD   Chief Complaint  Patient presents with   Shortness of Breath    History of Present Illness:    Heather Crawford is a 77 y.o. female presenting for follow-up of valvular heart disease and congestive heart failure.  The patient has been followed for moderate bioprosthetic aortic stenosis.  She has been managed with spironolactone and furosemide.  She has had trouble with the cost of SGLT2 inhibitors and Entresto.  Comorbid conditions include hypertension, complete AV block status post pacemaker, mixed hyperlipidemia, and coronary artery disease status post CABG.  Cardiovascular problems are outlined below:  Valvular heart disease, Rheumatic Fever as a child Aortic stenosis, s/p bioprosthetic AVR in 2012 Cath 04/2021: mean AV 30 mmHg TTE 03/18/21: EF 60-65, no RWMA, Gr 1 DD, normal RVSF, normal PASP, no MR, AVR w no AI and mean 22 mmHg  TTE 03/31/22: EF 60-65, no RWMA, Gr 1 DD, NL RVSF, NL PASP, mild LAE, mild MR, trivial AI, bioprosthetic aortic stenosis (mean 30, Vmax 340 cm/s, DI 0.26) Mitral regurgitation Mild to mod MR on TTE in 2021; no MR in 2023; mild MR 03/2022 Coronary artery disease  S/p CABG x 1 in 2012 (RIMA-RCA) Myoview 3/21: No ischemia, EF 68; low risk>>Chest pain improved on nitrates  Cath 04/07/21: mRCA 100 CTO, mLCx 30, OM1 25, mLAD 40; patent R-RCA - med Rx  (HFpEF) heart failure with preserved ejection fraction  Echo 3/21: EF 65-70, GRII DD RHC 3/23: low normal R and L heart filling pressures  Carotid artery disease [place] 11/15/20: Bilateral ICA 1-39; L subcl stenosis  [place] 11/04/21: Bilat ICA 1-39; normal flow bilat subcl arteries  Left subclavian  stenosis Noted 11/2020; normal flow on [place] in 2023  Aortic atherosclerosis  LBBB Symptomatic bradycardia S/p Pacemaker in 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  They both report that her breathing has worsened over the past year.  The patient is not out of the house very much, but with activity she is short of breath.  She denies orthopnea or PND.  She has had mild leg swelling at times but not problematic for her at present.  Due to findings of estimated high left atrial pressure on her recent echo, she was started on furosemide 20 mg daily and she feels like she is tolerating this fine.  She denies chest pressure, chest pain, lightheadedness, or heart palpitations.  She is short of breath with 1 flight of stairs or 1 city block and quite short of breath when walking uphill.  Past Medical History:  Diagnosis Date   Allergic rhinitis, cause unspecified    Aortic stenosis    a.  echo 1/12 w/mildly dilated LV, EF 45-50% w/paradoxical septal motion consistent w/ LBBB, moderate aortic stenosis w/mean gradient 27 mmHg, trivial AI, mild to moderate MR w/calcified mitral valve;   b. s/p AVR with 21 mm Magna Ease valve   Aortic valve replaced 2012   Arthritis    joint pain   Bilateral carotid artery disease (HCC) 08/15/2012   Carotid US 11/2021: Bilat ICA 1-39   CAD (  coronary artery disease)    a. s/p CABG 4/12: RIMA-RCA   CAD, multiple vessel    sees Dr Shirlee Latch every 3-4 months   Cancer [facility])    skin sarcomas removed   Carotid artery disease (HCC)    Carotid US 10/21: Bilateral ICA 1-39; left subclavian stenosis   CHF (congestive heart failure) (HCC)    chronic diastolic CHF   Chronic back pain    H/O BACK SURGERY   Coronary artery disease    adenosine myoview 2/12 w/EF 56%, ISCHEMIA W/SCAR IN THE MID TO APICAL ANTERIO WALL, SEPTAL WALL, AND APEX. LHC 3/12 W/EF 55%, 40% OSTIAL RCA, 60% MID RCA, 95% DISTAL RC A,  INTERVENTION  ATTEMPTED BUT UNABLE TO ADEQUADATELY SEAT  CATHETER.   Diverticulitis of colon (without mention of hemorrhage)(562.11)    Diverticulosis    Esophageal reflux    Glucose intolerance (impaired glucose tolerance)    A1C 6.3 (4/12)   H/O hiatal hernia    H/O vertigo    with fluid  in ears   Heart murmur    Hemorrhoids    Hemorrhoids    History of rheumatic fever    Hx of hysterectomy    Hx: UTI (urinary tract infection)    Hyperlipidemia    LBBB (left bundle branch block)    Myocardial infarction (HCC) 2002   Pneumonia    PONV (postoperative nausea and vomiting)    PPD positive    in the past   Snoring    Squamous cell carcinoma of skin    nose   Stenosis of left subclavian artery (HCC)    Carotid US 10/22: Bilateral ICA 1-39; L subclavian stenosis   Urinary frequency    Valvular heart disease s/p AVR in 2012 02/07/2010   AoV Replacement with Bovine prosthesis ' 12 Echo 2/23: EF 60-65, no RWMA, GR 1 DD, normal RVSF, normal PASP, AVR without aortic stenosis or AI (mean gradient 22 mmHg)    Past Surgical History:  Procedure Laterality Date   ABDOMINAL HYSTERECTOMY     APPENDECTOMY  1973   BACK SURGERY     Lumbar Lam and discectomy x 2   BREAST BIOPSY Right 10/2011   BREAST LUMPECTOMY Right    CARDIAC CATHETERIZATION  2012   CARDIAC VALVE REPLACEMENT  2012   Aortic   CORONARY ARTERY BYPASS GRAFT  2012   DILATION AND CURETTAGE OF UTERUS     ENDOMETRIAL ABLATION     LAPAROSCOPIC LYSIS INTESTINAL ADHESIONS     LUMBAR DISC SURGERY     DR. Deneen Harts- [place]   PACEMAKER IMPLANT N/A 01/17/2020   Procedure: PACEMAKER IMPLANT;  Surgeon: Regan Lemming, MD;  Location: [facility] INVASIVE CV LAB;  Service: Cardiovascular;  Laterality: N/A;   RIGHT/LEFT HEART CATH AND CORONARY/GRAFT ANGIOGRAPHY N/A 04/07/2021   Procedure: RIGHT/LEFT HEART CATH AND CORONARY/GRAFT ANGIOGRAPHY;  Surgeon: Yvonne Kendall, MD;  Location: [facility] INVASIVE CV LAB;  Service: Cardiovascular;   Laterality: N/A;   SKIN SURGERY     skin sarcomas removed   TONSILLECTOMY     1955   TONSILLECTOMY      Current Medications: Current Meds  Medication Sig   acetaminophen (TYLENOL) 325 MG tablet Take 1-2 tablets (325-650 mg total) by mouth every 4 (four) hours as needed for mild pain.   amLODipine (NORVASC) 2.5 MG tablet Take 1 tablet (2.5 mg total) by mouth daily.   aspirin EC 81 MG tablet Take 1 tablet (81 mg total) by mouth daily. Swallow whole.  azelastine (ASTELIN) 0.1 % nasal spray Place 2 sprays into both nostrils 2 (two) times daily. Use in each nostril as directed   cetirizine (ZYRTEC) 10 MG tablet Take 10 mg by mouth as needed for allergies.   Cholecalciferol 2000 units TABS Take 1 tablet (2,000 Units total) by mouth daily.   ezetimibe (ZETIA) 10 MG tablet Take 1 tablet (10 mg total) by mouth daily.   fluticasone (FLONASE) 50 MCG/ACT nasal spray USE 1 SPRAY IN EACH NOSTRIL EVERY DAY   furosemide (LASIX) 20 MG tablet Take 1 tablet (20 mg total) by mouth daily.   irbesartan (AVAPRO) 150 MG tablet Take 0.5 tablets (75 mg total) by mouth in the morning and at bedtime.   isosorbide mononitrate (IMDUR) 30 MG 24 hr tablet Take 1 tablet (30 mg total) by mouth daily.   loratadine (CLARITIN) 10 MG tablet Take 10 mg by mouth as needed for allergies.   meclizine (ANTIVERT) 25 MG tablet Take 25 mg by mouth 3 (three) times daily as needed for dizziness.   montelukast (SINGULAIR) 10 MG tablet Take 1 tablet (10 mg total) by mouth at bedtime.   omeprazole (PRILOSEC) 40 MG capsule TAKE 1 CAPSULE EVERY DAY   simvastatin (ZOCOR) 40 MG tablet Take 1 tablet (40 mg total) by mouth daily.     Allergies:   Crestor [rosuvastatin], Lipitor [atorvastatin], Sulfamethoxazole-trimethoprim, Tape, Amoxicillin, Neomycin, Neosporin [neomycin-bacitracin zn-polymyx], and Polysporin [bacitracin-polymyxin b]   Social History   Socioeconomic History   Marital status: Married    Spouse name: Not on file    Number of children: 2   Years of education: Not on file   Highest education level: Not on file  Occupational History   Occupation: retired    Associate Professor: RETIRED    Comment: [employer], Investment banker, corporate, CERTIFIED AS SPECIAL ED TEACHER  Tobacco Use   Smoking status: Former    Current packs/day: 0.00    Average packs/day: 1 pack/day for 40.0 years (40.0 ttl pk-yrs)    Types: Cigarettes    Start date: 02/03/1960    Quit date: 02/03/2000    Years since quitting: 22.7   Smokeless tobacco: Former    Types: Chew   Tobacco comments:    discussed LDCT   Vaping Use   Vaping status: Never Used  Substance and Sexual Activity   Alcohol use: No    Alcohol/week: 0.0 standard drinks of alcohol   Drug use: No   Sexual activity: Yes  Other Topics Concern   Not on file  Social History Narrative   MARRIED   1 SON 1 DAUGHTER 2 GRANDCHILDREN   Immunologist, NOW FULL TIME CAREGIVER   LIVES IN [place], GREW UP IN [place].   Social Determinants of Health   Financial Resource Strain: Low Risk  (06/24/2021)   Overall Financial Resource Strain (CARDIA)    Difficulty of Paying Living Expenses: Not hard at all  Food Insecurity: No Food Insecurity (06/24/2021)   Hunger Vital Sign    Worried About Running Out of Food in the Last Year: Never true    Ran Out of Food in the Last Year: Never true  Transportation Needs: No Transportation Needs (06/24/2021)   PRAPARE - Administrator, Civil Service (Medical): No    Lack of Transportation (Non-Medical): No  Physical Activity: Sufficiently Active (06/24/2021)   Exercise Vital Sign    Days of Exercise per Week: 5 days    Minutes of Exercise per Session: 30 min  Stress: No Stress Concern Present (  06/24/2021)   [employer] of Occupational Health - Occupational Stress Questionnaire    Feeling of Stress : Not at all  Social Connections: Socially Integrated (06/24/2021)   Social Connection and Isolation Panel [NHANES]    Frequency of Communication  with Friends and Family: More than three times a week    Frequency of Social Gatherings with Friends and Family: More than three times a week    Attends Religious Services: More than 4 times per year    Active Member of [employer] or Organizations: Yes    Attends Engineer, structural: More than 4 times per year    Marital Status: Married     Family History: The patient's family history includes Breast cancer in her maternal aunt, mother, paternal grandmother, and sister; CAD in her father; Colon cancer in her maternal aunt and paternal uncle; Diabetes type II in her mother; Heart attack in her father; Heart disease in her mother; Hypertension in her mother; Hypothyroidism in her mother; Pancreatic cancer in an other family member. There is no history of Stomach cancer, Throat cancer, or Esophageal cancer.  ROS:   Please see the history of present illness.    All other systems reviewed and are negative.  EKGs/Labs/Other Studies Reviewed:    The following studies were reviewed today: Echo: 1. Left ventricular ejection fraction, by estimation, is 55 to 60%. The  left ventricle has normal function. The left ventricle has no regional  wall motion abnormalities. There is mild left ventricular hypertrophy.  Left ventricular diastolic parameters  are consistent with Grade II diastolic dysfunction (pseudonormalization).  Elevated left atrial pressure.   2. Right ventricular systolic function is normal. The right ventricular  size is normal. There is normal pulmonary artery systolic pressure. The  estimated right ventricular systolic pressure is 26.4 mmHg.   3. The mitral valve is degenerative. Mild mitral valve regurgitation.  Severe mitral annular calcification.   4. The inferior vena cava is normal in size with greater than 50%  respiratory variability, suggesting right atrial pressure of 3 mmHg.   5. The aortic valve has been repaired/replaced. Aortic valve  regurgitation is mild.  There is a 21 mm Magna Ease valve present in the  aortic position. Procedure Date: 02/02/2010. Echo findings are consistent  with stenosis of the aortic prosthesis. Vmax 3.8   m/s, MG 35 mmHg, EOA 0.9 cm^2, DI 0.25       Recent Labs: 06/02/2022: Hemoglobin 11.7; NT-Pro BNP 850; Platelets 229 06/30/2022: BUN 16; Creatinine, Ser 0.94; Potassium 5.0; Sodium 139 07/06/2022: TSH 3.03  Recent Lipid Panel    Component Value Date/Time   CHOL 149 10/07/2021 1124   TRIG 185 (H) 10/07/2021 1124   HDL 50 10/07/2021 1124   CHOLHDL 3.0 10/07/2021 1124   CHOLHDL 3 07/02/2021 0833   VLDL 30.2 07/02/2021 0833   LDLCALC 68 10/07/2021 1124   LDLDIRECT 76.0 06/24/2020 1346     Risk Assessment/Calculations:                Physical Exam:    VS:  BP 130/70   Pulse 76   Ht 5\' 2"  (1.575 m)   Wt 164 lb 12.8 oz (74.8 kg)   SpO2 95%   BMI 30.14 kg/m     Wt Readings from Last 3 Encounters:  10/20/22 164 lb 12.8 oz (74.8 kg)  10/12/22 164 lb (74.4 kg)  07/06/22 166 lb (75.3 kg)     GEN:  Well nourished, well developed in no acute  distress HEENT: Normal NECK: No JVD; No carotid bruits LYMPHATICS: No lymphadenopathy CARDIAC: RRR, 3/6 harsh late peaking crescendo decrescendo murmur at the right upper sternal border RESPIRATORY:  Clear to auscultation without rales, wheezing or rhonchi  ABDOMEN: Soft, non-tender, non-distended MUSCULOSKELETAL:  No edema; No deformity  SKIN: Warm and dry NEUROLOGIC:  Alert and oriented x 3 PSYCHIATRIC:  Normal affect   ASSESSMENT:    1. Pre-procedural cardiovascular examination   2. Stenosis of prosthetic aortic valve, subsequent encounter   3. Coronary artery disease involving native coronary artery of native heart with angina pectoris (HCC)    PLAN:    In order of problems listed above:  Will draw preprocedure labs The patient has New York Heart Association functional class III symptoms of chronic diastolic heart failure with progressive bioprosthetic  aortic stenosis.  I went back and looked at her old echo studies.  Over the last 3 years, her mean transvalvular gradient is increased from 14 to 22 now up to 35 mmHg.  Dimensionless index has decreased from 0.39 to 0.36 now up to 0.25.  Peak transaortic velocity has increased from 2.4 to 3.0 to 3.8 m/s.  With her progressive symptoms of heart failure, it is appropriate to evaluate her for aortic valve intervention (valve in valve TAVR versus redo surgery).  We discussed the natural history of bioprosthetic aortic stenosis today.  Will start her workup with a gated CTA of the heart and a CTA of the chest, abdomen, and pelvis.  She will also be referred for right and left heart catheterization. I have reviewed the risks, indications, and alternatives to cardiac catheterization, possible angioplasty, and stenting with the patient. Risks include but are not limited to bleeding, infection, vascular injury, stroke, myocardial infection, arrhythmia, kidney injury, radiation-related injury in the case of prolonged fluoroscopy use, emergency cardiac surgery, and death. The patient understands the risks of serious complication is 1-2 in 1000 with diagnostic cardiac cath and 1-2% or less with angioplasty/stenting.  Once her studies are completed, her case will be reviewed by multidisciplinary heart team and she will be referred for formal cardiac surgical consultation. Plan to proceed with cardiac catheterization as outlined above.      Informed Consent   Shared Decision Making/Informed Consent The risks [stroke (1 in 1000), death (1 in 1000), kidney failure [usually temporary] (1 in 500), bleeding (1 in 200), allergic reaction [possibly serious] (1 in 200)], benefits (diagnostic support and management of coronary artery disease) and alternatives of a cardiac catheterization were discussed in detail with Ms. Varble and she is willing to proceed.       Medication Adjustments/Labs and Tests Ordered: Current  medicines are reviewed at length with the patient today.  Concerns regarding medicines are outlined above.  Orders Placed This Encounter  Procedures   CBC   Basic metabolic panel   No orders of the defined types were placed in this encounter.   Patient Instructions  Medication Instructions:  Your physician recommends that you continue on your current medications as directed. Please refer to the Current Medication list given to you today.  *If you need a refill on your cardiac medications before your next appointment, please call your pharmacy*  Lab Work: BMET today (previously ordered) CBC, BMET for cath procedure If you have labs (blood work) drawn today and your tests are completely normal, you will receive your results only by: MyChart Message (if you have MyChart) OR A paper copy in the mail If you have any lab test  that is abnormal or we need to change your treatment, we will call you to review the results.  Testing/Procedures: R and L heart catheterization Your physician has requested that you have a cardiac catheterization. Cardiac catheterization is used to diagnose and/or treat various heart conditions. Doctors may recommend this procedure for a number of different reasons. The most common reason is to evaluate chest pain. Chest pain can be a symptom of coronary artery disease (CAD), and cardiac catheterization can show whether plaque is narrowing or blocking your heart's arteries. This procedure is also used to evaluate the valves, as well as measure the blood flow and oxygen levels in different parts of your heart. For further information please visit [email or web address] Please follow instruction sheet, as given.  Follow-Up: At [facility], you and your health needs are our priority.  As part of our continuing mission to provide you with exceptional heart care, we have created designated Provider Care Teams.  These Care Teams include your primary Cardiologist  (physician) and Advanced Practice Providers (APPs -  Physician Assistants and Nurse Practitioners) who all work together to provide you with the care you need, when you need it.  Your next appointment:   Structural Team will follow-up  Provider:   Tonny Bollman, MD      Other Instructions       Cardiac/Peripheral Catheterization   You are scheduled for a Cardiac Catheterization on Thursday, October 24 with Dr. Tonny Bollman.  1. Please arrive at the [facility] (Main Entrance A) at [facility]: [address] [place], [place] [address] at 7:00 AM (This time is two hour(s) before your procedure to ensure your preparation). Free valet parking service is available. You will check in at ADMITTING. The support person will be asked to wait in the waiting room.  It is OK to have someone drop you off and come back when you are ready to be discharged.        Special note: Every effort is made to have your procedure done on time. Please understand that emergencies sometimes delay scheduled procedures.  2. Diet: Do not eat solid foods after midnight.  You may have clear liquids until 5 AM the day of the procedure.  3. Labs: You will need to have blood drawn on 11/19/22   4. Medication instructions in preparation for your procedure:   Contrast Allergy: No  DO NOT TAKE Furosemide the morning of procedure  On the morning of your procedure, take Aspirin 81 mg and any morning medicines NOT listed above.  You may use sips of water.  5. Plan to go home the same day, you will only stay overnight if medically necessary. 6. You MUST have a responsible adult to drive you home. 7. An adult MUST be with you the first 24 hours after you arrive home. 8. Bring a current list of your medications, and the last time and date medication taken. 9. Bring ID and current insurance cards. 10.Please wear clothes that are easy to get on and off and wear slip-on shoes.  Thank you for allowing [place] to care  for you!   -- [facility] Invasive Cardiovascular services    Signed, Tonny Bollman, MD  10/20/2022 1:44 PM    [facility]

## 2022-10-20 NOTE — Progress Notes (Addendum)
Pre Surgical Assessment: 5 M Walk Test  27M=16.50ft  5 Meter Walk Test- trial 1: 6.90 seconds 5 Meter Walk Test- trial 2: 7.35 seconds 5 Meter Walk Test- trial 3: 7.04 seconds 5 Meter Walk Test Average: 7.09 seconds  ____________________   Procedure Type: Isolated AVR Perioperative Outcome Estimate % Operative Mortality 4.26% Morbidity & Mortality 12.3% Stroke 3.67% Renal Failure 1.56% Reoperation 4.49% Prolonged Ventilation 8.23% Deep Sternal Wound Infection 0.169% Long Hospital Stay (>14 days) 5.8% Short Hospital Stay (<6 days)* 31%

## 2022-10-21 ENCOUNTER — Encounter: Payer: Self-pay | Admitting: Physician Assistant

## 2022-10-21 ENCOUNTER — Other Ambulatory Visit: Payer: Self-pay | Admitting: Physician Assistant

## 2022-10-21 DIAGNOSIS — T82857D Stenosis of cardiac prosthetic devices, implants and grafts, subsequent encounter: Secondary | ICD-10-CM

## 2022-10-27 NOTE — Progress Notes (Signed)
Remote pacemaker transmission.   

## 2022-10-30 ENCOUNTER — Ambulatory Visit (HOSPITAL_COMMUNITY)
Admission: RE | Admit: 2022-10-30 | Discharge: 2022-10-30 | Disposition: A | Discharge status: Home / Self Care | Payer: HMO | Source: Ambulatory Visit | Attending: Internal Medicine | Admitting: Internal Medicine

## 2022-10-30 DIAGNOSIS — I700 Atherosclerosis of aorta: Secondary | ICD-10-CM | POA: Diagnosis not present

## 2022-10-30 DIAGNOSIS — J439 Emphysema, unspecified: Secondary | ICD-10-CM | POA: Insufficient documentation

## 2022-10-30 DIAGNOSIS — Y828 Other medical devices associated with adverse incidents: Secondary | ICD-10-CM | POA: Insufficient documentation

## 2022-10-30 DIAGNOSIS — Z01818 Encounter for other preprocedural examination: Secondary | ICD-10-CM | POA: Diagnosis not present

## 2022-10-30 DIAGNOSIS — Z48812 Encounter for surgical aftercare following surgery on the circulatory system: Secondary | ICD-10-CM | POA: Diagnosis not present

## 2022-10-30 DIAGNOSIS — K5730 Diverticulosis of large intestine without perforation or abscess without bleeding: Secondary | ICD-10-CM | POA: Diagnosis not present

## 2022-10-30 DIAGNOSIS — I2510 Atherosclerotic heart disease of native coronary artery without angina pectoris: Secondary | ICD-10-CM | POA: Diagnosis not present

## 2022-10-30 DIAGNOSIS — T82857D Stenosis of cardiac prosthetic devices, implants and grafts, subsequent encounter: Secondary | ICD-10-CM | POA: Diagnosis not present

## 2022-10-30 DIAGNOSIS — M47816 Spondylosis without myelopathy or radiculopathy, lumbar region: Secondary | ICD-10-CM | POA: Diagnosis not present

## 2022-10-30 DIAGNOSIS — R918 Other nonspecific abnormal finding of lung field: Secondary | ICD-10-CM | POA: Diagnosis not present

## 2022-10-30 MED ORDER — IOHEXOL 350 MG/ML SOLN
100.0000 mL | Freq: Once | INTRAVENOUS | Status: AC | PRN
  Administered 2022-10-30: 100 mL via INTRAVENOUS

## 2022-11-05 ENCOUNTER — Ambulatory Visit (HOSPITAL_COMMUNITY)
Admission: RE | Admit: 2022-11-05 | Discharge: 2022-11-05 | Disposition: A | Discharge status: Home / Self Care | Payer: HMO | Source: Ambulatory Visit | Attending: Internal Medicine | Admitting: Internal Medicine

## 2022-11-05 DIAGNOSIS — I7789 Other specified disorders of arteries and arterioles: Secondary | ICD-10-CM

## 2022-11-05 DIAGNOSIS — I779 Disorder of arteries and arterioles, unspecified: Secondary | ICD-10-CM | POA: Diagnosis not present

## 2022-11-06 ENCOUNTER — Telehealth: Payer: Self-pay | Admitting: *Deleted

## 2022-11-06 ENCOUNTER — Other Ambulatory Visit (HOSPITAL_COMMUNITY): Payer: Self-pay

## 2022-11-06 DIAGNOSIS — I779 Disorder of arteries and arterioles, unspecified: Secondary | ICD-10-CM

## 2022-11-06 NOTE — Telephone Encounter (Signed)
-----   Message from Tereso Newcomer sent at 11/06/2022  1:07 PM EDT ----- Results sent to Heather Crawford via MyChart. See MyChart comments below. I will fwd to Heather Grandchild, MD as Lorain Childes. PLAN:  -Repeat carotid US in 1 year.  Heather Crawford  Your ultrasound shows stable plaque in both neck arteries. Continue current medications/treatment plan and follow up as scheduled. We will continue to monitor this with yearly studies. Tereso Newcomer, PA-C

## 2022-11-07 ENCOUNTER — Other Ambulatory Visit (HOSPITAL_COMMUNITY): Payer: Self-pay

## 2022-11-19 ENCOUNTER — Other Ambulatory Visit: Payer: Self-pay | Admitting: *Deleted

## 2022-11-19 ENCOUNTER — Ambulatory Visit: Payer: HMO

## 2022-11-19 ENCOUNTER — Ambulatory Visit: Payer: HMO | Attending: Cardiovascular Disease

## 2022-11-19 VITALS — BP 118/58 | HR 68 | Wt 165.0 lb

## 2022-11-19 DIAGNOSIS — Z01810 Encounter for preprocedural cardiovascular examination: Secondary | ICD-10-CM

## 2022-11-19 DIAGNOSIS — I25119 Atherosclerotic heart disease of native coronary artery with unspecified angina pectoris: Secondary | ICD-10-CM | POA: Diagnosis not present

## 2022-11-19 DIAGNOSIS — Z79899 Other long term (current) drug therapy: Secondary | ICD-10-CM | POA: Diagnosis not present

## 2022-11-19 DIAGNOSIS — Z01812 Encounter for preprocedural laboratory examination: Secondary | ICD-10-CM

## 2022-11-19 NOTE — Patient Instructions (Signed)
Medication Instructions:  Your physician recommends that you continue on your current medications as directed. Please refer to the Current Medication list given to you today.  *If you need a refill on your cardiac medications before your next appointment, please call your pharmacy*   Lab Work: Labs drawn today If you have labs (blood work) drawn today and your tests are completely normal, you will receive your results only by: MyChart Message (if you have MyChart) OR A paper copy in the mail If you have any lab test that is abnormal or we need to change your treatment, we will call you to review the results.   Testing/Procedures: Heart cath as scheduled   Follow-Up: At [facility], you and your health needs are our priority.  As part of our continuing mission to provide you with exceptional heart care, we have created designated Provider Care Teams.  These Care Teams include your primary Cardiologist (physician) and Advanced Practice Providers (APPs -  Physician Assistants and Nurse Practitioners) who all work together to provide you with the care you need, when you need it.  We recommend signing up for the patient portal called "MyChart".  Sign up information is provided on this After Visit Summary.  MyChart is used to connect with patients for Virtual Visits (Telemedicine).  Patients are able to view lab/test results, encounter notes, upcoming appointments, etc.  Non-urgent messages can be sent to your provider as well.   To learn more about what you can do with MyChart, go to [email or web address].    Your next appointment:   To be scheduled

## 2022-11-19 NOTE — Progress Notes (Signed)
Nurse Visit   Date of Encounter: 11/19/2022 ID: Heather Crawford, Heather Crawford Jan 11, 1946, MRN [id number]  PCP:  [name], MD   [facility] Providers Cardiologist:  Tonny Bollman, MD Cardiology APP:  Beatrice Lecher, PA-C  Electrophysiologist:  Regan Lemming, MD      Visit Details   VS:  BP (!) 118/58   Pulse 68   Wt 165 lb (74.8 kg)   BMI 30.18 kg/m  , BMI Body mass index is 30.18 kg/m.  Wt Readings from Last 3 Encounters:  11/19/22 165 lb (74.8 kg)  10/20/22 164 lb 12.8 oz (74.8 kg)  10/12/22 164 lb (74.4 kg)     Reason for visit: EKG and Labs for Heart Cath scheduled for 11/26/22 Performed today: Vitals, EKG and consulted with Dr Diamantina Monks Changes (medications, testing, etc.) : No changes today Length of Visit: 25 minutes    Medications Adjustments/Labs and Tests Ordered: Orders Placed This Encounter  Procedures   EKG 12-Lead   No orders of the defined types were placed in this encounter.    Signed, Alois Cliche, RN  11/19/2022 12:35 PM

## 2022-11-20 LAB — BASIC METABOLIC PANEL
BUN/Creatinine Ratio: 21 (ref 12–28)
BUN: 20 mg/dL (ref 8–27)
CO2: 24 mmol/L (ref 20–29)
Calcium: 9.1 mg/dL (ref 8.7–10.3)
Chloride: 100 mmol/L (ref 96–106)
Creatinine, Ser: 0.96 mg/dL (ref 0.57–1.00)
Glucose: 130 mg/dL — ABNORMAL HIGH (ref 70–99)
Potassium: 4.8 mmol/L (ref 3.5–5.2)
Sodium: 139 mmol/L (ref 134–144)
eGFR: 60 mL/min/{1.73_m2} (ref >59)

## 2022-11-20 LAB — CBC
Hematocrit: 38.1 % (ref 34.0–46.6)
Hemoglobin: 12.1 g/dL (ref 11.1–15.9)
MCH: 28.8 pg (ref 26.6–33.0)
MCHC: 31.8 g/dL (ref 31.5–35.7)
MCV: 91 fL (ref 79–97)
Platelets: 223 10*3/uL (ref 150–450)
RBC: 4.2 x10E6/uL (ref 3.77–5.28)
RDW: 13 % (ref 11.7–15.4)
WBC: 6.6 10*3/uL (ref 3.4–10.8)

## 2022-11-24 ENCOUNTER — Telehealth: Payer: Self-pay | Admitting: *Deleted

## 2022-11-24 NOTE — Telephone Encounter (Signed)
Cardiac Catheterization scheduled at [facility] for: Thursday November 26, 2022 9 AM Arrival time [facility] Main Entrance A at: 7 AM  Nothing to eat after midnight prior to procedure, clear liquids until 5 AM day of procedure.  Medication instructions: -Hold:  Lasix-AM of procedure -Other usual morning medications can be taken with sips of water including aspirin 81 mg.  Plan to go home the same day, you will only stay overnight if medically necessary.  You must have responsible adult to drive you home.  Someone must be with you the first 24 hours after you arrive home.  Reviewed procedure instructions with patient.

## 2022-11-26 ENCOUNTER — Encounter (HOSPITAL_COMMUNITY)
Admission: RE | Disposition: A | Discharge status: Home / Self Care | Payer: Self-pay | Source: Home / Self Care | Attending: Cardiovascular Disease

## 2022-11-26 ENCOUNTER — Telehealth: Payer: Self-pay | Admitting: Cardiovascular Disease

## 2022-11-26 ENCOUNTER — Other Ambulatory Visit: Payer: Self-pay

## 2022-11-26 ENCOUNTER — Ambulatory Visit (HOSPITAL_COMMUNITY)
Admission: RE | Admit: 2022-11-26 | Discharge: 2022-11-26 | Disposition: A | Discharge status: Home / Self Care | Payer: HMO | Attending: Cardiovascular Disease | Admitting: Cardiovascular Disease

## 2022-11-26 DIAGNOSIS — T82857D Stenosis of cardiac prosthetic devices, implants and grafts, subsequent encounter: Secondary | ICD-10-CM | POA: Diagnosis not present

## 2022-11-26 DIAGNOSIS — Z950 Presence of cardiac pacemaker: Secondary | ICD-10-CM | POA: Insufficient documentation

## 2022-11-26 DIAGNOSIS — I5032 Chronic diastolic (congestive) heart failure: Secondary | ICD-10-CM | POA: Insufficient documentation

## 2022-11-26 DIAGNOSIS — I272 Pulmonary hypertension, unspecified: Secondary | ICD-10-CM | POA: Diagnosis not present

## 2022-11-26 DIAGNOSIS — I080 Rheumatic disorders of both mitral and aortic valves: Secondary | ICD-10-CM | POA: Insufficient documentation

## 2022-11-26 DIAGNOSIS — I6523 Occlusion and stenosis of bilateral carotid arteries: Secondary | ICD-10-CM | POA: Diagnosis not present

## 2022-11-26 DIAGNOSIS — I110 Hypertensive heart disease with heart failure: Secondary | ICD-10-CM | POA: Diagnosis not present

## 2022-11-26 DIAGNOSIS — Z951 Presence of aortocoronary bypass graft: Secondary | ICD-10-CM | POA: Diagnosis not present

## 2022-11-26 DIAGNOSIS — E782 Mixed hyperlipidemia: Secondary | ICD-10-CM | POA: Insufficient documentation

## 2022-11-26 DIAGNOSIS — Z79899 Other long term (current) drug therapy: Secondary | ICD-10-CM | POA: Insufficient documentation

## 2022-11-26 DIAGNOSIS — I25119 Atherosclerotic heart disease of native coronary artery with unspecified angina pectoris: Secondary | ICD-10-CM | POA: Insufficient documentation

## 2022-11-26 DIAGNOSIS — I442 Atrioventricular block, complete: Secondary | ICD-10-CM | POA: Diagnosis not present

## 2022-11-26 DIAGNOSIS — Y832 Surgical operation with anastomosis, bypass or graft as the cause of abnormal reaction of the patient, or of later complication, without mention of misadventure at the time of the procedure: Secondary | ICD-10-CM | POA: Insufficient documentation

## 2022-11-26 DIAGNOSIS — Z953 Presence of xenogenic heart valve: Secondary | ICD-10-CM | POA: Diagnosis not present

## 2022-11-26 DIAGNOSIS — I2510 Atherosclerotic heart disease of native coronary artery without angina pectoris: Secondary | ICD-10-CM | POA: Diagnosis not present

## 2022-11-26 DIAGNOSIS — Z87891 Personal history of nicotine dependence: Secondary | ICD-10-CM | POA: Insufficient documentation

## 2022-11-26 DIAGNOSIS — I2584 Coronary atherosclerosis due to calcified coronary lesion: Secondary | ICD-10-CM | POA: Diagnosis not present

## 2022-11-26 DIAGNOSIS — I2582 Chronic total occlusion of coronary artery: Secondary | ICD-10-CM | POA: Diagnosis not present

## 2022-11-26 HISTORY — PX: RIGHT/LEFT HEART CATH AND CORONARY/GRAFT ANGIOGRAPHY: CATH118267

## 2022-11-26 LAB — POCT I-STAT 7, (LYTES, BLD GAS, ICA,H+H)
Acid-Base Excess: 1 mmol/L (ref 0.0–2.0)
Bicarbonate: 25.5 mmol/L (ref 20.0–28.0)
Calcium, Ion: 1.1 mmol/L — ABNORMAL LOW (ref 1.15–1.40)
HCT: 30 % — ABNORMAL LOW (ref 36.0–46.0)
Hemoglobin: 10.2 g/dL — ABNORMAL LOW (ref 12.0–15.0)
O2 Saturation: 97 %
Potassium: 3.8 mmol/L (ref 3.5–5.1)
Sodium: 142 mmol/L (ref 135–145)
TCO2: 27 mmol/L (ref 22–32)
pCO2 arterial: 41.3 mm[Hg] (ref 32–48)
pH, Arterial: 7.399 (ref 7.35–7.45)
pO2, Arterial: 93 mm[Hg] (ref 83–108)

## 2022-11-26 LAB — POCT I-STAT EG7
Acid-Base Excess: 2 mmol/L (ref 0.0–2.0)
Bicarbonate: 27.7 mmol/L (ref 20.0–28.0)
Calcium, Ion: 1.13 mmol/L — ABNORMAL LOW (ref 1.15–1.40)
HCT: 30 % — ABNORMAL LOW (ref 36.0–46.0)
Hemoglobin: 10.2 g/dL — ABNORMAL LOW (ref 12.0–15.0)
O2 Saturation: 65 %
Potassium: 3.8 mmol/L (ref 3.5–5.1)
Sodium: 142 mmol/L (ref 135–145)
TCO2: 29 mmol/L (ref 22–32)
pCO2, Ven: 46.3 mm[Hg] (ref 44–60)
pH, Ven: 7.385 (ref 7.25–7.43)
pO2, Ven: 35 mm[Hg] (ref 32–45)

## 2022-11-26 SURGERY — RIGHT/LEFT HEART CATH AND CORONARY/GRAFT ANGIOGRAPHY
Anesthesia: LOCAL

## 2022-11-26 MED ORDER — LABETALOL HCL 5 MG/ML IV SOLN: 10.0000 mg | INTRAVENOUS | Status: DC | PRN

## 2022-11-26 MED ORDER — SODIUM CHLORIDE 0.9 % IV SOLN: 250.0000 mL | INTRAVENOUS | Status: DC | PRN

## 2022-11-26 MED ORDER — MIDAZOLAM HCL 2 MG/2ML IJ SOLN
INTRAMUSCULAR | Status: DC | PRN
  Administered 2022-11-26: 1 mg via INTRAVENOUS

## 2022-11-26 MED ORDER — FENTANYL CITRATE (PF) 100 MCG/2ML IJ SOLN
INTRAMUSCULAR | Status: AC
  Filled 2022-11-26: qty 2

## 2022-11-26 MED ORDER — VERAPAMIL HCL 2.5 MG/ML IV SOLN
INTRAVENOUS | Status: DC | PRN
  Administered 2022-11-26: 10 mL via INTRA_ARTERIAL

## 2022-11-26 MED ORDER — SODIUM CHLORIDE 0.9 % WEIGHT BASED INFUSION
3.0000 mL/kg/h | INTRAVENOUS | Status: AC
  Administered 2022-11-26: 3 mL/kg/h via INTRAVENOUS

## 2022-11-26 MED ORDER — MIDAZOLAM HCL 2 MG/2ML IJ SOLN
INTRAMUSCULAR | Status: AC
  Filled 2022-11-26: qty 2

## 2022-11-26 MED ORDER — VERAPAMIL HCL 2.5 MG/ML IV SOLN
INTRAVENOUS | Status: AC
  Filled 2022-11-26: qty 2

## 2022-11-26 MED ORDER — LIDOCAINE HCL (PF) 1 % IJ SOLN
INTRAMUSCULAR | Status: DC | PRN
  Administered 2022-11-26: 2 mL

## 2022-11-26 MED ORDER — SODIUM CHLORIDE 0.9% FLUSH: 3.0000 mL | INTRAVENOUS | Status: DC | PRN

## 2022-11-26 MED ORDER — SODIUM CHLORIDE 0.9% FLUSH: 3.0000 mL | Freq: Two times a day (BID) | INTRAVENOUS | Status: DC

## 2022-11-26 MED ORDER — ONDANSETRON HCL 4 MG/2ML IJ SOLN: 4.0000 mg | Freq: Four times a day (QID) | INTRAMUSCULAR | Status: DC | PRN

## 2022-11-26 MED ORDER — SODIUM CHLORIDE 0.9 % WEIGHT BASED INFUSION: 1.0000 mL/kg/h | INTRAVENOUS | Status: DC

## 2022-11-26 MED ORDER — FENTANYL CITRATE (PF) 100 MCG/2ML IJ SOLN
INTRAMUSCULAR | Status: DC | PRN
  Administered 2022-11-26: 25 ug via INTRAVENOUS

## 2022-11-26 MED ORDER — ACETAMINOPHEN 325 MG PO TABS: 650.0000 mg | ORAL_TABLET | ORAL | Status: DC | PRN

## 2022-11-26 MED ORDER — LIDOCAINE HCL (PF) 1 % IJ SOLN
INTRAMUSCULAR | Status: AC
  Filled 2022-11-26: qty 30

## 2022-11-26 MED ORDER — HEPARIN (PORCINE) IN NACL 1000-0.9 UT/500ML-% IV SOLN
INTRAVENOUS | Status: DC | PRN
  Administered 2022-11-26: 1000 mL via INTRA_ARTERIAL

## 2022-11-26 MED ORDER — HYDRALAZINE HCL 20 MG/ML IJ SOLN: 10.0000 mg | INTRAMUSCULAR | Status: DC | PRN

## 2022-11-26 MED ORDER — ASPIRIN 81 MG PO CHEW: 81.0000 mg | CHEWABLE_TABLET | ORAL | Status: DC

## 2022-11-26 MED ORDER — HEPARIN SODIUM (PORCINE) 1000 UNIT/ML IJ SOLN
INTRAMUSCULAR | Status: DC | PRN
  Administered 2022-11-26: 4000 [IU] via INTRAVENOUS

## 2022-11-26 SURGICAL SUPPLY — 9 items
CATH 5FR JL3.5 JR4 ANG PIG MP (CATHETERS) IMPLANT
CATH BALLN WEDGE 5F 110CM (CATHETERS) IMPLANT
DEVICE RAD COMP TR BAND LRG (VASCULAR PRODUCTS) IMPLANT
GLIDESHEATH SLEND SS 6F .021 (SHEATH) IMPLANT
GUIDEWIRE INQWIRE 1.5J.035X260 (WIRE) IMPLANT
INQWIRE 1.5J .035X260CM (WIRE) ×1
PACK CARDIAC CATHETERIZATION (CUSTOM PROCEDURE TRAY) ×1 IMPLANT
SET ATX-X65L (MISCELLANEOUS) IMPLANT
SHEATH GLIDE SLENDER 4/5FR (SHEATH) IMPLANT

## 2022-11-26 NOTE — Telephone Encounter (Signed)
Pre-operative Risk Assessment    Patient Name: Heather Crawford  DOB: [birth year] MRN: [id number]     Request for Surgical Clearance    Procedure:   Exam and X Rays  Date of Surgery:  Clearance 12/07/22                                 Surgeon:  Dr. Lilla Shook Surgeon's Group or Practice Name:  [facility] Phone number:  [phone] Fax number:  [phone]   Type of Clearance Requested:   - Medical  - Pharmacy:  [facility] if she needs to hole any medications      Type of Anesthesia:  None    Additional requests/questions:   Dentist office would like to know if she needs any pre medication due to bleeding.   Signed, Tawni Millers   11/26/2022, 2:39 PM

## 2022-11-26 NOTE — Telephone Encounter (Signed)
   Primary Cardiologist: Tonny Bollman, MD  Chart reviewed as part of pre-operative protocol coverage. Simple dental extractions are considered low risk procedures per guidelines and generally do not require any specific cardiac clearance. It is also generally accepted that for simple extractions and dental cleanings, there is no need to interrupt blood thinner therapy.   SBE prophylaxis is required for the patient.  I will route this recommendation to the requesting party via Epic fax function and remove from pre-op pool.  Please call with questions.  Levi Aland, NP-C 11/26/2022, 3:19 PM [address] Office [phone] Fax [phone]

## 2022-11-26 NOTE — H&P (Signed)
Expand All Collapse All  Cardiology Office Note:     Date:  10/20/2022    ID:  Heather Crawford, Emmanuel May 18, 1945, MRN [id number]   PCP:  [name], MD              [facility] Providers Cardiologist:  Tonny Bollman, MD Cardiology APP:  Beatrice Lecher, PA-C  Electrophysiologist:  Will Jorja Loa, MD      Referring MD: [name], MD       Chief Complaint  Patient presents with   Shortness of Breath      History of Present Illness:     Heather Crawford is a 77 y.o. female presenting for follow-up of valvular heart disease and congestive heart failure.  The patient has been followed for moderate bioprosthetic aortic stenosis.  She has been managed with spironolactone and furosemide.  She has had trouble with the cost of SGLT2 inhibitors and Entresto.  Comorbid conditions include hypertension, complete AV block status post pacemaker, mixed hyperlipidemia, and coronary artery disease status post CABG.  Cardiovascular problems are outlined below:   Valvular heart disease, Rheumatic Fever as a child Aortic stenosis, s/p bioprosthetic AVR in 2012 Cath 04/2021: mean AV 30 mmHg TTE 03/18/21: EF 60-65, no RWMA, Gr 1 DD, normal RVSF, normal PASP, no MR, AVR w no AI and mean 22 mmHg  TTE 03/31/22: EF 60-65, no RWMA, Gr 1 DD, NL RVSF, NL PASP, mild LAE, mild MR, trivial AI, bioprosthetic aortic stenosis (mean 30, Vmax 340 cm/s, DI 0.26) Mitral regurgitation Mild to mod MR on TTE in 2021; no MR in 2023; mild MR 03/2022 Coronary artery disease  S/p CABG x 1 in 2012 (RIMA-RCA) Myoview 3/21: No ischemia, EF 68; low risk>>Chest pain improved on nitrates  Cath 04/07/21: mRCA 100 CTO, mLCx 30, OM1 25, mLAD 40; patent R-RCA - med Rx  (HFpEF) heart failure with preserved ejection fraction  Echo 3/21: EF 65-70, GRII DD RHC 3/23: low normal R and L heart filling pressures  Carotid artery disease [place] 11/15/20: Bilateral ICA 1-39; L subcl stenosis  [place] 11/04/21: Bilat ICA 1-39;  normal flow bilat subcl arteries  Left subclavian stenosis Noted 11/2020; normal flow on [place] in 2023  Aortic atherosclerosis  LBBB Symptomatic bradycardia S/p Pacemaker in 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  They both report that her breathing has worsened over the past year.  The patient is not out of the house very much, but with activity she is short of breath.  She denies orthopnea or PND.  She has had mild leg swelling at times but not problematic for her at present.  Due to findings of estimated high left atrial pressure on her recent echo, she was started on furosemide 20 mg daily and she feels like she is tolerating this fine.  She denies chest pressure, chest pain, lightheadedness, or heart palpitations.  She is short of breath with 1 flight of stairs or 1 city block and quite short of breath when walking uphill.       Past Medical History:  Diagnosis Date   Allergic rhinitis, cause unspecified     Aortic stenosis      a.  echo 1/12 w/mildly dilated LV, EF 45-50% w/paradoxical septal motion consistent w/ LBBB, moderate aortic stenosis w/mean gradient 27 mmHg, trivial AI, mild to moderate MR w/calcified mitral valve;   b. s/p AVR with 21 mm  Magna Ease valve   Aortic valve replaced 2012   Arthritis      joint pain   Bilateral carotid artery disease (HCC) 08/15/2012    Carotid US 11/2021: Bilat ICA 1-39   CAD (coronary artery disease)      a. s/p CABG 4/12: RIMA-RCA   CAD, multiple vessel      sees Dr Shirlee Latch every 3-4 months   Cancer [facility])      skin sarcomas removed   Carotid artery disease (HCC)      Carotid US 10/21: Bilateral ICA 1-39; left subclavian stenosis   CHF (congestive heart failure) (HCC)      chronic diastolic CHF   Chronic back pain      H/O BACK SURGERY   Coronary artery disease      adenosine myoview 2/12 w/EF 56%, ISCHEMIA W/SCAR IN THE MID TO APICAL ANTERIO WALL, SEPTAL WALL, AND  APEX. LHC 3/12 W/EF 55%, 40% OSTIAL RCA, 60% MID RCA, 95% DISTAL RC A, INTERVENTION  ATTEMPTED BUT UNABLE TO ADEQUADATELY SEAT  CATHETER.   Diverticulitis of colon (without mention of hemorrhage)(562.11)     Diverticulosis     Esophageal reflux     Glucose intolerance (impaired glucose tolerance)      A1C 6.3 (4/12)   H/O hiatal hernia     H/O vertigo      with fluid  in ears   Heart murmur     Hemorrhoids     Hemorrhoids     History of rheumatic fever     Hx of hysterectomy     Hx: UTI (urinary tract infection)     Hyperlipidemia     LBBB (left bundle branch block)     Myocardial infarction (HCC) 2002   Pneumonia     PONV (postoperative nausea and vomiting)     PPD positive      in the past   Snoring     Squamous cell carcinoma of skin      nose   Stenosis of left subclavian artery (HCC)      Carotid US 10/22: Bilateral ICA 1-39; L subclavian stenosis   Urinary frequency     Valvular heart disease s/p AVR in 2012 02/07/2010    AoV Replacement with Bovine prosthesis ' 12 Echo 2/23: EF 60-65, no RWMA, GR 1 DD, normal RVSF, normal PASP, AVR without aortic stenosis or AI (mean gradient 22 mmHg)               Past Surgical History:  Procedure Laterality Date   ABDOMINAL HYSTERECTOMY       APPENDECTOMY   1973   BACK SURGERY        Lumbar Lam and discectomy x 2   BREAST BIOPSY Right 10/2011   BREAST LUMPECTOMY Right     CARDIAC CATHETERIZATION   2012   CARDIAC VALVE REPLACEMENT   2012    Aortic   CORONARY ARTERY BYPASS GRAFT   2012   DILATION AND CURETTAGE OF UTERUS       ENDOMETRIAL ABLATION       LAPAROSCOPIC LYSIS INTESTINAL ADHESIONS       LUMBAR DISC SURGERY        DR. Deneen Harts- [place]   PACEMAKER IMPLANT N/A 01/17/2020    Procedure: PACEMAKER IMPLANT;  Surgeon: Regan Lemming, MD;  Location: [facility] INV[facility]IVE [facility];  Service: Cardiovascular;  Laterality: N/A;   RIGHT/LEFT HEART CATH AND CORONARY/GRAFT ANGIOGRAPHY N/A 04/07/2021    Procedure: RIGHT/LEFT HEART  CATH  AND CORONARY/GRAFT ANGIOGRAPHY;  Surgeon: Yvonne Kendall, MD;  Location: [facility] INVAS[facility] CV [facility];  Service: Cardiovascular;  Laterality: N/A;   SKIN SURGERY        skin sarcomas removed   TONSILLECTOMY        1955   TONSILLECTOMY              Current Medications: Active Medications      Current Meds  Medication Sig   acetaminophen (TYLENOL) 325 MG tablet Take 1-2 tablets (325-650 mg total) by mouth every 4 (four) hours as needed for mild pain.   amLODipine (NORVASC) 2.5 MG tablet Take 1 tablet (2.5 mg total) by mouth daily.   aspirin EC 81 MG tablet Take 1 tablet (81 mg total) by mouth daily. Swallow whole.   azelastine (ASTELIN) 0.1 % nasal spray Place 2 sprays into both nostrils 2 (two) times daily. Use in each nostril as directed   cetirizine (ZYRTEC) 10 MG tablet Take 10 mg by mouth as needed for allergies.   Cholecalciferol 2000 units TABS Take 1 tablet (2,000 Units total) by mouth daily.   ezetimibe (ZETIA) 10 MG tablet Take 1 tablet (10 mg total) by mouth daily.   fluticasone (FLONASE) 50 MCG/ACT nasal spray USE 1 SPRAY IN EACH NOSTRIL EVERY DAY   furosemide (LASIX) 20 MG tablet Take 1 tablet (20 mg total) by mouth daily.   irbesartan (AVAPRO) 150 MG tablet Take 0.5 tablets (75 mg total) by mouth in the morning and at bedtime.   isosorbide mononitrate (IMDUR) 30 MG 24 hr tablet Take 1 tablet (30 mg total) by mouth daily.   loratadine (CLARITIN) 10 MG tablet Take 10 mg by mouth as needed for allergies.   meclizine (ANTIVERT) 25 MG tablet Take 25 mg by mouth 3 (three) times daily as needed for dizziness.   montelukast (SINGULAIR) 10 MG tablet Take 1 tablet (10 mg total) by mouth at bedtime.   omeprazole (PRILOSEC) 40 MG capsule TAKE 1 CAPSULE EVERY DAY   simvastatin (ZOCOR) 40 MG tablet Take 1 tablet (40 mg total) by mouth daily.        Allergies:   Crestor [rosuvastatin], Lipitor [atorvastatin], Sulfamethoxazole-trimethoprim, Tape, Amoxicillin, Neomycin, Neosporin  [neomycin-bacitracin zn-polymyx], and Polysporin [bacitracin-polymyxin b]    Social History         Socioeconomic History   Marital status: Married      Spouse name: Not on file   Number of children: 2   Years of education: Not on file   Highest education level: Not on file  Occupational History   Occupation: retired      Associate Professor: RETIRED      Comment: [employer], Investment banker, corporate, CERTIFIED AS SPECIAL ED TEACHER  Tobacco Use   Smoking status: Former      Current packs/day: 0.00      Average packs/day: 1 pack/day for 40.0 years (40.0 ttl pk-yrs)      Types: Cigarettes      Start date: 02/03/1960      Quit date: 02/03/2000      Years since quitting: 22.7   Smokeless tobacco: Former      Types: Chew   Tobacco comments:      discussed LDCT   Vaping Use   Vaping status: Never Used  Substance and Sexual Activity   Alcohol use: No      Alcohol/week: 0.0 standard drinks of alcohol   Drug use: No   Sexual activity: Yes  Other Topics Concern   Not on file  Social History Narrative    MARRIED    1 SON 1 DAUGHTER 2 GRANDCHILDREN    Immunologist, NOW FULL TIME CAREGIVER    LIVES IN [place], GREW UP IN [place].    Social Determinants of Health        Financial Resource Strain: Low Risk  (06/24/2021)    Overall Financial Resource Strain (CARDIA)     Difficulty of Paying Living Expenses: Not hard at all  Food Insecurity: No Food Insecurity (06/24/2021)    Hunger Vital Sign     Worried About Running Out of Food in the Last Year: Never true     Ran Out of Food in the Last Year: Never true  Transportation Needs: No Transportation Needs (06/24/2021)    PRAPARE - Therapist, art (Medical): No     Lack of Transportation (Non-Medical): No  Physical Activity: Sufficiently Active (06/24/2021)    Exercise Vital Sign     Days of Exercise per Week: 5 days     Minutes of Exercise per Session: 30 min  Stress: No Stress Concern Present (06/24/2021)    [employer]  of Occupational Health - Occupational Stress Questionnaire     Feeling of Stress : Not at all  Social Connections: Socially Integrated (06/24/2021)    Social Connection and Isolation Panel [NHANES]     Frequency of Communication with Friends and Family: More than three times a week     Frequency of Social Gatherings with Friends and Family: More than three times a week     Attends Religious Services: More than 4 times per year     Active Member of [employer] or Organizations: Yes     Attends Engineer, structural: More than 4 times per year     Marital Status: Married      Family History: The patient's family history includes Breast cancer in her maternal aunt, mother, paternal grandmother, and sister; CAD in her father; Colon cancer in her maternal aunt and paternal uncle; Diabetes type II in her mother; Heart attack in her father; Heart disease in her mother; Hypertension in her mother; Hypothyroidism in her mother; Pancreatic cancer in an other family member. There is no history of Stomach cancer, Throat cancer, or Esophageal cancer.   ROS:   Please see the history of present illness.    All other systems reviewed and are negative.   EKGs/Labs/Other Studies Reviewed:     The following studies were reviewed today: Echo: 1. Left ventricular ejection fraction, by estimation, is 55 to 60%. The  left ventricle has normal function. The left ventricle has no regional  wall motion abnormalities. There is mild left ventricular hypertrophy.  Left ventricular diastolic parameters  are consistent with Grade II diastolic dysfunction (pseudonormalization).  Elevated left atrial pressure.   2. Right ventricular systolic function is normal. The right ventricular  size is normal. There is normal pulmonary artery systolic pressure. The  estimated right ventricular systolic pressure is 26.4 mmHg.   3. The mitral valve is degenerative. Mild mitral valve regurgitation.  Severe mitral annular  calcification.   4. The inferior vena cava is normal in size with greater than 50%  respiratory variability, suggesting right atrial pressure of 3 mmHg.   5. The aortic valve has been repaired/replaced. Aortic valve  regurgitation is mild. There is a 21 mm Magna Ease valve present in the  aortic position. Procedure Date: 02/02/2010. Echo findings are consistent  with stenosis of the aortic prosthesis.  Vmax 3.8   m/s, MG 35 mmHg, EOA 0.9 cm^2, DI 0.25        Recent Labs: 06/02/2022: Hemoglobin 11.7; NT-Pro BNP 850; Platelets 229 06/30/2022: BUN 16; Creatinine, Ser 0.94; Potassium 5.0; Sodium 139 07/06/2022: TSH 3.03  Recent Lipid Panel Labs (Brief)          Component Value Date/Time    CHOL 149 10/07/2021 1124    TRIG 185 (H) 10/07/2021 1124    HDL 50 10/07/2021 1124    CHOLHDL 3.0 10/07/2021 1124    CHOLHDL 3 07/02/2021 0833    VLDL 30.2 07/02/2021 0833    LDLCALC 68 10/07/2021 1124    LDLDIRECT 76.0 06/24/2020 1346          Risk Assessment/Calculations:                   Physical Exam:     VS:  BP 130/70   Pulse 76   Ht 5\' 2"  (1.575 m)   Wt 164 lb 12.8 oz (74.8 kg)   SpO2 95%   BMI 30.14 kg/m         Wt Readings from Last 3 Encounters:  10/20/22 164 lb 12.8 oz (74.8 kg)  10/12/22 164 lb (74.4 kg)  07/06/22 166 lb (75.3 kg)      GEN:  Well nourished, well developed in no acute distress HEENT: Normal NECK: No JVD; No carotid bruits LYMPHATICS: No lymphadenopathy CARDIAC: RRR, 3/6 harsh late peaking crescendo decrescendo murmur at the right upper sternal border RESPIRATORY:  Clear to auscultation without rales, wheezing or rhonchi  ABDOMEN: Soft, non-tender, non-distended MUSCULOSKELETAL:  No edema; No deformity  SKIN: Warm and dry NEUROLOGIC:  Alert and oriented x 3 PSYCHIATRIC:  Normal affect    ASSESSMENT:     1. Pre-procedural cardiovascular examination   2. Stenosis of prosthetic aortic valve, subsequent encounter   3. Coronary artery disease  involving native coronary artery of native heart with angina pectoris (HCC)     PLAN:     In order of problems listed above:   Will draw preprocedure labs The patient has New York Heart Association functional class III symptoms of chronic diastolic heart failure with progressive bioprosthetic aortic stenosis.  I went back and looked at her old echo studies.  Over the last 3 years, her mean transvalvular gradient is increased from 14 to 22 now up to 35 mmHg.  Dimensionless index has decreased from 0.39 to 0.36 now up to 0.25.  Peak transaortic velocity has increased from 2.4 to 3.0 to 3.8 m/s.  With her progressive symptoms of heart failure, it is appropriate to evaluate her for aortic valve intervention (valve in valve TAVR versus redo surgery).  We discussed the natural history of bioprosthetic aortic stenosis today.  Will start her workup with a gated CTA of the heart and a CTA of the chest, abdomen, and pelvis.  She will also be referred for right and left heart catheterization. I have reviewed the risks, indications, and alternatives to cardiac catheterization, possible angioplasty, and stenting with the patient. Risks include but are not limited to bleeding, infection, vascular injury, stroke, myocardial infection, arrhythmia, kidney injury, radiation-related injury in the case of prolonged fluoroscopy use, emergency cardiac surgery, and death. The patient understands the risks of serious complication is 1-2 in 1000 with diagnostic cardiac cath and 1-2% or less with angioplasty/stenting.  Once her studies are completed, her case will be reviewed by multidisciplinary heart team and she will be referred for formal  cardiac surgical consultation. Plan to proceed with cardiac catheterization as outlined above.       ADDENDUM: No changes reported since the time of this office visit 10/20/2022.  Plan to proceed with right and left heart catheterization as outlined above.  Cardiac catheterization films  reviewed as prior cath was performed from the right radial with injection of the RIMA to RCA graft.  Plan to do right and left heart catheterization from right radial access.  Otherwise as above.  Tonny Bollman 11/26/2022 9:06 AM

## 2022-11-27 ENCOUNTER — Other Ambulatory Visit: Payer: Self-pay | Admitting: Physician Assistant

## 2022-11-27 ENCOUNTER — Other Ambulatory Visit: Payer: Self-pay

## 2022-11-27 ENCOUNTER — Encounter (HOSPITAL_COMMUNITY): Payer: Self-pay | Admitting: Cardiovascular Disease

## 2022-11-27 MED ORDER — AZITHROMYCIN 500 MG PO TABS
500.0000 mg | ORAL_TABLET | ORAL | 12 refills | Status: DC
  Filled 2022-11-27: qty 6, 6d supply, fill #0
  Filled 2023-01-23: qty 6, 6d supply, fill #1
  Filled 2023-04-05: qty 6, 6d supply, fill #2
  Filled 2023-04-15: qty 6, 6d supply, fill #3

## 2022-12-02 ENCOUNTER — Other Ambulatory Visit: Payer: Self-pay | Admitting: Internal Medicine

## 2022-12-02 DIAGNOSIS — R058 Other specified cough: Secondary | ICD-10-CM

## 2022-12-02 DIAGNOSIS — J301 Allergic rhinitis due to pollen: Secondary | ICD-10-CM

## 2022-12-03 ENCOUNTER — Other Ambulatory Visit (HOSPITAL_COMMUNITY): Payer: Self-pay

## 2022-12-03 ENCOUNTER — Other Ambulatory Visit: Payer: Self-pay

## 2022-12-03 ENCOUNTER — Telehealth: Payer: Self-pay | Admitting: Cardiovascular Disease

## 2022-12-03 MED ORDER — AZELASTINE HCL 0.1 % NA SOLN
2.0000 | Freq: Two times a day (BID) | NASAL | 1 refills | Status: DC
  Filled 2022-12-03: qty 60, 100d supply, fill #0
  Filled 2023-03-10: qty 60, 50d supply, fill #1
  Filled 2023-04-15 – 2023-05-13 (×2): qty 60, 50d supply, fill #2

## 2022-12-03 MED ORDER — MONTELUKAST SODIUM 10 MG PO TABS
10.0000 mg | ORAL_TABLET | Freq: Every day | ORAL | 1 refills | Status: DC
  Filled 2022-12-03: qty 90, 90d supply, fill #0
  Filled 2023-04-05: qty 90, 90d supply, fill #1

## 2022-12-03 NOTE — Telephone Encounter (Signed)
Heather Crawford from Dental office is requesting a callback regarding message she received from [facility] regarding clearance so she now has more questions. Please advise

## 2022-12-03 NOTE — Telephone Encounter (Signed)
Received vm from Dr. Cindi Carbon office from [facility] [place].  This has been faxed to office and refaxed today. Went over SBE prophylax.

## 2022-12-03 NOTE — Telephone Encounter (Signed)
Lvm to follow up in questions.

## 2022-12-08 ENCOUNTER — Other Ambulatory Visit: Payer: Self-pay

## 2022-12-08 DIAGNOSIS — I350 Nonrheumatic aortic (valve) stenosis: Secondary | ICD-10-CM

## 2022-12-16 NOTE — Progress Notes (Unsigned)
[address]       [place] [address]             [phone]           MADYSN FARACE [facility] Medical Record #[id number] Date of Birth: December 18, 1945  [name], MD [name], MD  Chief Complaint:  Prosthetic aortic valve stenosis   History of Present Illness:     Pt is a pleasant 77 yo female who underwent AVR/Cabgx1 (21mm magna ease/Rima to RCA) in 2012. Pt has been followed recently for progressive prosthetic aortic valve stenosis over the past several years. Recently she has been suffering from increasing DOE. She denies CP or Lightheadedness or lower ext edema. She underwent recent echo with EF 55% and now a mean gradient of . She was noted to have severe MAC and mild MR. Due to her symptoms and stenosis it was felt that she would best be served with valve in valve TAVR. She underwent CTA with femoral access however her ascending aorta was noted to be highly calcified. Also her RCA is low takeoff and cath with patent RIMA to RCA.  Of note, she did in the previous 2 years progress to bradycardia requiring PPM.      Past Medical History:  Diagnosis Date   Allergic rhinitis, cause unspecified    Aortic stenosis    a.  echo 1/12 w/mildly dilated LV, EF 45-50% w/paradoxical septal motion consistent w/ LBBB, moderate aortic stenosis w/mean gradient 27 mmHg, trivial AI, mild to moderate MR w/calcified mitral valve;   b. s/p AVR with 21 mm Magna Ease valve   Aortic valve replaced 2012   Arthritis    joint pain   Bilateral carotid artery disease (HCC) 08/15/2012   Carotid US 11/2021: Bilat ICA 1-39   CAD (coronary artery disease)    a. s/p CABG 4/12: RIMA-RCA   CAD, multiple vessel    sees Dr Shirlee Latch every 3-4 months   Cancer [facility])    skin sarcomas removed   Carotid artery disease (HCC)    Carotid US 10/21: Bilateral ICA 1-39; left subclavian stenosis   CHF (congestive heart failure) (HCC)    chronic diastolic CHF   Chronic back pain     H/O BACK SURGERY   Coronary artery disease    adenosine myoview 2/12 w/EF 56%, ISCHEMIA W/SCAR IN THE MID TO APICAL ANTERIO WALL, SEPTAL WALL, AND APEX. LHC 3/12 W/EF 55%, 40% OSTIAL RCA, 60% MID RCA, 95% DISTAL RC A, INTERVENTION  ATTEMPTED BUT UNABLE TO ADEQUADATELY SEAT  CATHETER.   Diverticulitis of colon (without mention of hemorrhage)(562.11)    Diverticulosis    Esophageal reflux    Glucose intolerance (impaired glucose tolerance)    A1C 6.3 (4/12)   H/O hiatal hernia    H/O vertigo    with fluid  in ears   Heart murmur    Hemorrhoids    Hemorrhoids    History of rheumatic fever    Hx of hysterectomy    Hx: UTI (urinary tract infection)    Hyperlipidemia    LBBB (left bundle branch block)    Myocardial infarction (HCC) 2002   Pneumonia    PONV (postoperative nausea and vomiting)    PPD positive    in the past   Snoring    Squamous cell carcinoma of skin    nose   Stenosis of left subclavian artery (HCC)    Carotid US 10/22: Bilateral ICA 1-39;  L subclavian stenosis   Urinary frequency    Valvular heart disease s/p AVR in 2012 02/07/2010   AoV Replacement with Bovine prosthesis ' 12 Echo 2/23: EF 60-65, no RWMA, GR 1 DD, normal RVSF, normal PASP, AVR without aortic stenosis or AI (mean gradient 22 mmHg)    Past Surgical History:  Procedure Laterality Date   ABDOMINAL HYSTERECTOMY     APPENDECTOMY  1973   BACK SURGERY     Lumbar Lam and discectomy x 2   BREAST BIOPSY Right 10/2011   BREAST LUMPECTOMY Right    CARDIAC CATHETERIZATION  2012   CARDIAC VALVE REPLACEMENT  2012   Aortic   CORONARY ARTERY BYPASS GRAFT  2012   DILATION AND CURETTAGE OF UTERUS     ENDOMETRIAL ABLATION     LAPAROSCOPIC LYSIS INTESTINAL ADHESIONS     LUMBAR DISC SURGERY     DR. Deneen Harts- [place]   PACEMAKER IMPLANT N/A 01/17/2020   Procedure: PACEMAKER IMPLANT;  Surgeon: Regan Lemming, MD;  Location: [facility] INVASIVE CV LAB;  Service: Cardiovascular;  Laterality: N/A;   RIGHT/LEFT  HEART CATH AND CORONARY/GRAFT ANGIOGRAPHY N/A 04/07/2021   Procedure: RIGHT/LEFT HEART CATH AND CORONARY/GRAFT ANGIOGRAPHY;  Surgeon: Yvonne Kendall, MD;  Location: [facility] INVASIVE CV LAB;  Service: Cardiovascular;  Laterality: N/A;   RIGHT/LEFT HEART CATH AND CORONARY/GRAFT ANGIOGRAPHY N/A 11/26/2022   Procedure: RIGHT/LEFT HEART CATH AND CORONARY/GRAFT ANGIOGRAPHY;  Surgeon: Tonny Bollman, MD;  Location: [facility] INVASIVE CV LAB;  Service: Cardiovascular;  Laterality: N/A;   SKIN SURGERY     skin sarcomas removed   TONSILLECTOMY     1955   TONSILLECTOMY      Social History   Tobacco Use  Smoking Status Former   Current packs/day: 0.00   Average packs/day: 1 pack/day for 40.0 years (40.0 ttl pk-yrs)   Types: Cigarettes   Start date: 02/03/1960   Quit date: 02/03/2000   Years since quitting: 22.8  Smokeless Tobacco Former   Types: Chew  Tobacco Comments   discussed LDCT     Social History   Substance and Sexual Activity  Alcohol Use No   Alcohol/week: 0.0 standard drinks of alcohol    Social History   Socioeconomic History   Marital status: Married    Spouse name: Not on file   Number of children: 2   Years of education: Not on file   Highest education level: Not on file  Occupational History   Occupation: retired    Associate Professor: RETIRED    Comment: [employer], Investment banker, corporate, CERTIFIED AS SPECIAL ED TEACHER  Tobacco Use   Smoking status: Former    Current packs/day: 0.00    Average packs/day: 1 pack/day for 40.0 years (40.0 ttl pk-yrs)    Types: Cigarettes    Start date: 02/03/1960    Quit date: 02/03/2000    Years since quitting: 22.8   Smokeless tobacco: Former    Types: Chew   Tobacco comments:    discussed LDCT   Vaping Use   Vaping status: Never Used  Substance and Sexual Activity   Alcohol use: No    Alcohol/week: 0.0 standard drinks of alcohol   Drug use: No   Sexual activity: Yes  Other Topics Concern   Not on file  Social History Narrative   MARRIED   1 SON 1  DAUGHTER 2 GRANDCHILDREN   Immunologist, NOW FULL TIME CAREGIVER   LIVES IN [place], GREW UP IN [place].   Social Determinants of Corporate investment banker  Strain: Low Risk  (06/24/2021)   Overall Financial Resource Strain (CARDIA)    Difficulty of Paying Living Expenses: Not hard at all  Food Insecurity: No Food Insecurity (06/24/2021)   Hunger Vital Sign    Worried About Running Out of Food in the Last Year: Never true    Ran Out of Food in the Last Year: Never true  Transportation Needs: No Transportation Needs (06/24/2021)   PRAPARE - Administrator, Civil Service (Medical): No    Lack of Transportation (Non-Medical): No  Physical Activity: Sufficiently Active (06/24/2021)   Exercise Vital Sign    Days of Exercise per Week: 5 days    Minutes of Exercise per Session: 30 min  Stress: No Stress Concern Present (06/24/2021)   [employer] of Occupational Health - Occupational Stress Questionnaire    Feeling of Stress : Not at all  Social Connections: Socially Integrated (06/24/2021)   Social Connection and Isolation Panel [NHANES]    Frequency of Communication with Friends and Family: More than three times a week    Frequency of Social Gatherings with Friends and Family: More than three times a week    Attends Religious Services: More than 4 times per year    Active Member of [employer] or Organizations: Yes    Attends Engineer, structural: More than 4 times per year    Marital Status: Married  Catering manager Violence: Not At Risk (06/24/2021)   Humiliation, Afraid, Rape, and Kick questionnaire    Fear of Current or Ex-Partner: No    Emotionally Abused: No    Physically Abused: No    Sexually Abused: No    Allergies  Allergen Reactions   Crestor [Rosuvastatin] Other (See Comments)    myalgia   Lipitor [Atorvastatin] Other (See Comments)    myalgia   Sulfamethoxazole-Trimethoprim Hives   Tape Hives    Breakouts from the adhesive in paper tape and  patches   Amoxicillin Itching    Did it involve swelling of the face/tongue/throat, SOB, or low BP? No Did it involve sudden or severe rash/hives, skin peeling, or any reaction on the inside of your mouth or nose? No Did you need to seek medical attention at a hospital or doctor's office? No When did it last happen?   per pt long time ago    If all above answers are "NO", may proceed with cephalosporin use.    Neomycin Itching    Topical neomycin Blister in ears   Neosporin [Neomycin-Bacitracin Zn-Polymyx] Other (See Comments)    Caused blistered   Polysporin [Bacitracin-Polymyxin B] Rash    Current Outpatient Medications  Medication Sig Dispense Refill   acetaminophen (TYLENOL) 325 MG tablet Take 1-2 tablets (325-650 mg total) by mouth every 4 (four) hours as needed for mild pain.     amLODipine (NORVASC) 2.5 MG tablet Take 1 tablet (2.5 mg total) by mouth daily. 90 tablet 3   aspirin EC 81 MG tablet Take 1 tablet (81 mg total) by mouth daily. Swallow whole. 90 tablet 3   azelastine (ASTELIN) 0.1 % nasal spray Place 2 sprays into both nostrils 2 (two) times daily. Use in each nostril as directed 90 mL 1   azithromycin (ZITHROMAX) 500 MG tablet Take 1 tablet (500 mg total) by mouth as directed. Take one tablet 1 hour before any dental work including cleanings. 6 tablet 12   carboxymethylcellulose (REFRESH TEARS) 0.5 % SOLN Place 1 drop into both eyes 3 (three) times daily as needed (  Burning eyes).     cetirizine (ZYRTEC) 10 MG tablet Take 10 mg by mouth as needed for allergies.     Cholecalciferol 2000 units TABS Take 1 tablet (2,000 Units total) by mouth daily. (Patient taking differently: Take 1,000 tablets by mouth daily.) 90 tablet 1   ezetimibe (ZETIA) 10 MG tablet Take 1 tablet (10 mg total) by mouth daily. 90 tablet 3   fluticasone (FLONASE) 50 MCG/ACT nasal spray USE 1 SPRAY IN EACH NOSTRIL EVERY DAY (Patient taking differently: Place 1 spray into both nostrils daily as needed for  allergies or rhinitis.) 32 g 5   furosemide (LASIX) 20 MG tablet Take 1 tablet (20 mg total) by mouth daily.     irbesartan (AVAPRO) 150 MG tablet Take 0.5 tablets (75 mg total) by mouth in the morning and at bedtime. 90 tablet 3   isosorbide mononitrate (IMDUR) 30 MG 24 hr tablet Take 1 tablet (30 mg total) by mouth daily. 90 tablet 3   loratadine (CLARITIN) 10 MG tablet Take 10 mg by mouth as needed for allergies.     meclizine (ANTIVERT) 25 MG tablet Take 25 mg by mouth 3 (three) times daily as needed for dizziness. OTC     montelukast (SINGULAIR) 10 MG tablet Take 1 tablet (10 mg total) by mouth at bedtime. 90 tablet 1   nitroGLYCERIN (NITROSTAT) 0.4 MG SL tablet Place 1 tablet (0.4 mg total) under the tongue every 5 (five) minutes as needed for chest pain. 25 tablet 3   omeprazole (PRILOSEC) 40 MG capsule TAKE 1 CAPSULE EVERY DAY (Patient taking differently: Take 40 mg by mouth daily as needed (Heartburn).) 90 capsule 1   simvastatin (ZOCOR) 40 MG tablet Take 1 tablet (40 mg total) by mouth daily. 90 tablet 3   No current facility-administered medications for this visit.     Family History  Problem Relation Age of Onset   Hypertension Mother    Breast cancer Mother    Hypothyroidism Mother    Diabetes type II Mother    Heart disease Mother    Heart attack Father        CABG @ 38   CAD Father    Breast cancer Sister        x 2   Colon cancer Maternal Aunt    Breast cancer Maternal Aunt    Colon cancer Paternal Uncle    Pancreatic cancer Other        PGA   Breast cancer Paternal Grandmother    Stomach cancer Neg Hx    Throat cancer Neg Hx    Esophageal cancer Neg Hx        Physical Exam: Lungs: clear Card: RR with 3/6 sem Ext: no edema Neuro: intact     Diagnostic Studies & Laboratory data: I have personally reviewed the following studies and agree with the findings   TTE (10/2022) IMPRESSIONS     1. Left ventricular ejection fraction, by estimation, is 55 to  60%. The  left ventricle has normal function. The left ventricle has no regional  wall motion abnormalities. There is mild left ventricular hypertrophy.  Left ventricular diastolic parameters  are consistent with Grade II diastolic dysfunction (pseudonormalization).  Elevated left atrial pressure.   2. Right ventricular systolic function is normal. The right ventricular  size is normal. There is normal pulmonary artery systolic pressure. The  estimated right ventricular systolic pressure is 26.4 mmHg.   3. The mitral valve is degenerative. Mild mitral valve regurgitation.  Severe mitral annular calcification.   4. The inferior vena cava is normal in size with greater than 50%  respiratory variability, suggesting right atrial pressure of 3 mmHg.   5. The aortic valve has been repaired/replaced. Aortic valve  regurgitation is mild. There is a 21 mm Magna Ease valve present in the  aortic position. Procedure Date: 02/02/2010. Echo findings are consistent  with stenosis of the aortic prosthesis. Vmax 3.8   m/s, MG 35 mmHg, EOA 0.9 cm^2, DI 0.25   FINDINGS   Left Ventricle: Left ventricular ejection fraction, by estimation, is 55  to 60%. The left ventricle has normal function. The left ventricle has no  regional wall motion abnormalities. The left ventricular internal cavity  size was normal in size. There is   mild left ventricular hypertrophy. Left ventricular diastolic parameters  are consistent with Grade II diastolic dysfunction (pseudonormalization).  Elevated left atrial pressure.   Right Ventricle: The right ventricular size is normal. No increase in  right ventricular wall thickness. Right ventricular systolic function is  normal. There is normal pulmonary artery systolic pressure. The tricuspid  regurgitant velocity is 2.42 m/s, and   with an assumed right atrial pressure of 3 mmHg, the estimated right  ventricular systolic pressure is 26.4 mmHg.   Left Atrium: Left atrial size  was normal in size.   Right Atrium: Right atrial size was normal in size.   Mitral Valve: The mitral valve is degenerative in appearance. Severe  mitral annular calcification. Mild mitral valve regurgitation.   Tricuspid Valve: The tricuspid valve is normal in structure. Tricuspid  valve regurgitation is trivial.   Aortic Valve: The aortic valve has been repaired/replaced. Aortic valve  regurgitation is mild. Aortic valve mean gradient measures 35.0 mmHg.  Aortic valve peak gradient measures 57.8 mmHg. Aortic valve area, by VTI  measures 0.88 cm. There is a 21 mm  Magna valve present in the aortic position. Procedure Date: 02/02/2010.   Pulmonic Valve: The pulmonic valve was not well visualized. Pulmonic valve  regurgitation is not visualized.   Aorta: The aortic root and ascending aorta are structurally normal, with  no evidence of dilitation.   Venous: The inferior vena cava is normal in size with greater than 50%  respiratory variability, suggesting right atrial pressure of 3 mmHg.   LEFT VENTRICLE  PLAX 2D  LVIDd:         4.90 cm   Diastology  LVIDs:         3.40 cm   LV e' medial:    4.35 cm/s  LV PW:         1.10 cm   LV E/e' medial:  27.6  LV IVS:        1.30 cm   LV e' lateral:   6.31 cm/s  LVOT diam:     2.10 cm   LV E/e' lateral: 19.0  LV SV:         91  LV SV Index:   52  LVOT Area:     3.46 cm     RIGHT VENTRICLE  RV Basal diam:  3.50 cm  RV Mid diam:    1.90 cm  RV S prime:     12.10 cm/s  TAPSE (M-mode): 1.5 cm   LEFT ATRIUM             Index        RIGHT ATRIUM           Index  LA diam:  4.30 cm 2.45 cm/m   RA Area:     10.60 cm  LA Vol (A2C):   47.8 ml 27.20 ml/m  RA Volume:   21.30 ml  12.12 ml/m  LA Vol (A4C):   48.9 ml 27.83 ml/m  LA Biplane Vol: 49.7 ml 28.29 ml/m   AORTIC VALVE  AV Area (Vmax):    0.96 cm  AV Area (Vmean):   0.91 cm  AV Area (VTI):     0.88 cm  AV Vmax:           380.00 cm/s  AV Vmean:          265.500 cm/s  AV  VTI:            1.040 m  AV Peak Grad:      57.8 mmHg  AV Mean Grad:      35.0 mmHg  LVOT Vmax:         105.00 cm/s  LVOT Vmean:        69.400 cm/s  LVOT VTI:          0.264 m  LVOT/AV VTI ratio: 0.25    AORTA  Ao Root diam: 3.10 cm  Ao Asc diam:  3.40 cm   MITRAL VALVE                TRICUSPID VALVE  MV Area (PHT): 1.84 cm     TR Peak grad:   23.4 mmHg  MV Decel Time: 412 msec     TR Vmax:        242.00 cm/s  MV E velocity: 120.00 cm/s  TR Vmean:       300.0 cm/s  MV A velocity: 128.00 cm/s  MV E/A ratio:  0.94         SHUNTS                              Systemic VTI:  0.26 m                              Systemic Diam: 2.10 cm   CATH(11/2022) Conclusion  1.  Patent left main, LAD, and left circumflex, with calcification and mild nonobstructive plaquing stable from the previous cardiac catheterization study 2.  Chronic occlusion of the RCA with patent RIMA to RCA graft 3.  Known severe prosthetic aortic stenosis, aortic valve not crossed 4.  Mild pulmonary hypertension with PA pressure 41/24, mean 32 mmHg.  Transpulmonary gradient is 11 mmHg with a PVR of 2.5 Wood units.  Recent Radiology Findings:       Recent Lab Findings: CTA (11/2022) FINDINGS: Aortic valve: There is a 21mm Magna Ease valve in the aortic position. Either a 20mm or 23mm Sapien 3 valve would be options for valve in valve THV implantation. For 23mm Sapien 3, the virtual THV to coronary (VTC) distance measures 7mm to left main and 1mm to RCA. For 20mm Sapien 3, the VTC distance measures 8mm to left main and 2mm to RCA. This suggests elevated risk of RCA obstruction with valve in valve procedure for both 23mm and 20mm Sapien 3 valve   Right atrium: Normal size   Right ventricle: Mild dilatation   Pulmonary arteries: Normal size   Pulmonary veins: Normal configuration   Left atrium: Mild enlargement   Left ventricle: Normal size   Pericardium: Normal thickness   Coronary arteries: Normal  origins. Calcium score 2516 (98th percentile). Patent RIMA-RCA   IMPRESSION: 1. There is a 21mm Magna Ease valve in the aortic position. Either a 20mm or 23mm Sapien 3 valve would be options for valve in valve THV implantation. For 23mm Sapien 3, the virtual THV to coronary (VTC) distance measures 7mm to left main and 1mm to RCA. For 20mm Sapien 3, the VTC distance measures 8mm to left main and 2mm to RCA. This suggests elevated risk of RCA obstruction with valve in valve procedure for both 23mm and 20mm Sapien 3 valve   2.  Coronary calcium score 2516 (98th percentile).  Patent RIMA-RCA Lab Results  Component Value Date   WBC 6.6 11/19/2022   HGB 10.2 (L) 11/26/2022   HGB 10.2 (L) 11/26/2022   HCT 30.0 (L) 11/26/2022   HCT 30.0 (L) 11/26/2022   PLT 223 11/19/2022   GLUCOSE 130 (H) 11/19/2022   CHOL 149 10/07/2021   TRIG 185 (H) 10/07/2021   HDL 50 10/07/2021   LDLDIRECT 76.0 06/24/2020   LDLCALC 68 10/07/2021   ALT 17 10/07/2021   AST 19 10/07/2021   NA 142 11/26/2022   NA 142 11/26/2022   K 3.8 11/26/2022   K 3.8 11/26/2022   CL 100 11/19/2022   CREATININE 0.96 11/19/2022   BUN 20 11/19/2022   CO2 24 11/19/2022   TSH 3.03 07/06/2022   INR 3.1 06/18/2010   HGBA1C 6.8 (H) 07/06/2022      Assessment / Plan:     77 yo female with NYHA class 2-3 symptoms of severe prosthetic aortic valve stenosis with patent RIMA to RCA and highly calcified ascending aorta. Pt has a class I indication to proceed with redo AVR but due to her being not a surgical candidate, would best be served with valve in valve TAVR with a 23mm Sapien via the femoral access with Sentinal protection device. All the risks and goals of surgery along with recovery discussed. Pt quite concerned over her ability to leave after one day due to her concerns on what she will need to do at home after with little help from family she has noted even following her cath.    I have spent 60 min in review of the records,  viewing studies and in face to face with patient and in coordination of future care    Eugenio Hoes 12/16/2022 12:24 PM

## 2022-12-17 ENCOUNTER — Institutional Professional Consult (permissible substitution) (INDEPENDENT_AMBULATORY_CARE_PROVIDER_SITE_OTHER): Payer: HMO | Admitting: Thoracic Surgery (Cardiothoracic Vascular Surgery)

## 2022-12-17 ENCOUNTER — Encounter: Payer: Self-pay | Admitting: Thoracic Surgery (Cardiothoracic Vascular Surgery)

## 2022-12-17 VITALS — BP 148/70 | HR 67 | Resp 20 | Ht 62.0 in | Wt 162.0 lb

## 2022-12-17 DIAGNOSIS — I350 Nonrheumatic aortic (valve) stenosis: Secondary | ICD-10-CM

## 2022-12-17 NOTE — Patient Instructions (Signed)
Valve in valve TAVR

## 2022-12-18 ENCOUNTER — Encounter: Payer: Self-pay | Admitting: Cardiology

## 2022-12-18 NOTE — Progress Notes (Addendum)
PPM device orders received for upcoming surgery. Shari Prows, with [employer], notified via phone call and email.

## 2022-12-18 NOTE — Progress Notes (Signed)
PERIOPERATIVE PRESCRIPTION FOR IMPLANTED CARDIAC DEVICE PROGRAMMING  Patient Information: Name:  Heather Crawford  DOB:  October 27, 1945  MRN:  [id number]    Planned Procedure:  transcatheter aortic valve in valve replacement, transfemoral approach with cerebral embolic protection  Surgeon:  Dr. Excell Seltzer and Dr. [name]  Date of Procedure:  12/22/22  Cautery will be used.  Position during surgery:  supine   Please send documentation back to:  [facility] (Fax # [phone])   Device Information:  Clinic EP Physician:  Loman Brooklyn, MD   Device Type:  Pacemaker Manufacturer and Phone #:  [employer]: [id number] Pacemaker Dependent?:  No. Date of Last Device Check:  10/15/22 Normal Device Function?:  Yes.    Electrophysiologist's Recommendations:  Have magnet available. Provide continuous ECG monitoring when magnet is used or reprogramming is to be performed.  Procedure will likely interfere with device function.  Device should be programmed:  Asynchronous pacing during procedure and returned to normal programming after procedure  Per Device Clinic Standing Orders, Lenor Coffin, RN  10:42 AM 12/18/2022

## 2022-12-21 ENCOUNTER — Other Ambulatory Visit: Payer: Self-pay

## 2022-12-21 ENCOUNTER — Ambulatory Visit (HOSPITAL_COMMUNITY)
Admission: RE | Admit: 2022-12-21 | Discharge: 2022-12-21 | Disposition: A | Discharge status: Home / Self Care | Payer: HMO | Source: Ambulatory Visit | Attending: Cardiovascular Disease | Admitting: Cardiovascular Disease

## 2022-12-21 ENCOUNTER — Inpatient Hospital Stay (HOSPITAL_COMMUNITY)
Admission: RE | Admit: 2022-12-21 | Discharge: 2022-12-21 | Disposition: A | Discharge status: Home / Self Care | Payer: HMO | Source: Ambulatory Visit | Attending: Cardiovascular Disease

## 2022-12-21 ENCOUNTER — Encounter: Payer: Self-pay | Admitting: Dermatology

## 2022-12-21 ENCOUNTER — Other Ambulatory Visit (HOSPITAL_COMMUNITY): Payer: Self-pay

## 2022-12-21 ENCOUNTER — Ambulatory Visit: Payer: HMO | Admitting: Dermatology

## 2022-12-21 VITALS — BP 93/54 | HR 83

## 2022-12-21 DIAGNOSIS — Z01818 Encounter for other preprocedural examination: Secondary | ICD-10-CM | POA: Diagnosis not present

## 2022-12-21 DIAGNOSIS — I700 Atherosclerosis of aorta: Secondary | ICD-10-CM | POA: Diagnosis not present

## 2022-12-21 DIAGNOSIS — L578 Other skin changes due to chronic exposure to nonionizing radiation: Secondary | ICD-10-CM | POA: Diagnosis not present

## 2022-12-21 DIAGNOSIS — L570 Actinic keratosis: Secondary | ICD-10-CM | POA: Diagnosis not present

## 2022-12-21 DIAGNOSIS — Z1152 Encounter for screening for COVID-19: Secondary | ICD-10-CM | POA: Insufficient documentation

## 2022-12-21 DIAGNOSIS — Z5111 Encounter for antineoplastic chemotherapy: Secondary | ICD-10-CM

## 2022-12-21 DIAGNOSIS — W908XXA Exposure to other nonionizing radiation, initial encounter: Secondary | ICD-10-CM | POA: Diagnosis not present

## 2022-12-21 DIAGNOSIS — I350 Nonrheumatic aortic (valve) stenosis: Secondary | ICD-10-CM | POA: Diagnosis not present

## 2022-12-21 LAB — URINALYSIS, ROUTINE W REFLEX MICROSCOPIC
Bilirubin Urine: NEGATIVE
Glucose, UA: 50 mg/dL — AB
Hgb urine dipstick: NEGATIVE
Ketones, ur: NEGATIVE mg/dL
Leukocytes,Ua: NEGATIVE
Nitrite: NEGATIVE
Protein, ur: NEGATIVE mg/dL
Specific Gravity, Urine: 1.005 (ref 1.005–1.030)
pH: 6 (ref 5.0–8.0)

## 2022-12-21 LAB — CBC
HCT: 38.7 % (ref 36.0–46.0)
Hemoglobin: 12.4 g/dL (ref 12.0–15.0)
MCH: 29.3 pg (ref 26.0–34.0)
MCHC: 32 g/dL (ref 30.0–36.0)
MCV: 91.5 fL (ref 80.0–100.0)
Platelets: 214 10*3/uL (ref 150–400)
RBC: 4.23 MIL/uL (ref 3.87–5.11)
RDW: 12.8 % (ref 11.5–15.5)
WBC: 7.8 10*3/uL (ref 4.0–10.5)
nRBC: 0 % (ref 0.0–0.2)

## 2022-12-21 LAB — COMPREHENSIVE METABOLIC PANEL
ALT: 19 U/L (ref 0–44)
AST: 18 U/L (ref 15–41)
Albumin: 4 g/dL (ref 3.5–5.0)
Alkaline Phosphatase: 47 U/L (ref 38–126)
Anion gap: 8 (ref 5–15)
BUN: 18 mg/dL (ref 8–23)
CO2: 26 mmol/L (ref 22–32)
Calcium: 8.8 mg/dL — ABNORMAL LOW (ref 8.9–10.3)
Chloride: 101 mmol/L (ref 98–111)
Creatinine, Ser: 1.03 mg/dL — ABNORMAL HIGH (ref 0.44–1.00)
GFR, Estimated: 55 mL/min — ABNORMAL LOW (ref >60)
Glucose, Bld: 223 mg/dL — ABNORMAL HIGH (ref 70–99)
Potassium: 3.7 mmol/L (ref 3.5–5.1)
Sodium: 135 mmol/L (ref 135–145)
Total Bilirubin: 0.4 mg/dL (ref <1.2)
Total Protein: 7.2 g/dL (ref 6.5–8.1)

## 2022-12-21 LAB — TYPE AND SCREEN
ABO/RH(D): A POS
Antibody Screen: NEGATIVE

## 2022-12-21 LAB — PROTIME-INR
INR: 1 (ref 0.8–1.2)
Prothrombin Time: 13.7 s (ref 11.4–15.2)

## 2022-12-21 LAB — SURGICAL PCR SCREEN
MRSA, PCR: NEGATIVE
Staphylococcus aureus: NEGATIVE

## 2022-12-21 LAB — SARS CORONAVIRUS 2 BY RT PCR: SARS Coronavirus 2 by RT PCR: NEGATIVE

## 2022-12-21 MED ORDER — VANCOMYCIN HCL IN DEXTROSE 1-5 GM/200ML-% IV SOLN
1000.0000 mg | INTRAVENOUS | Status: AC
  Filled 2022-12-21: qty 200

## 2022-12-21 MED ORDER — MAGNESIUM SULFATE 50 % IJ SOLN
40.0000 meq | INTRAMUSCULAR | Status: AC
  Filled 2022-12-21 (×2): qty 9.85

## 2022-12-21 MED ORDER — HYDROCORTISONE 2.5 % EX CREA
TOPICAL_CREAM | Freq: Two times a day (BID) | CUTANEOUS | 2 refills | Status: DC
  Filled 2022-12-21: qty 30, 30d supply, fill #0

## 2022-12-21 MED ORDER — FLUOROURACIL 5 % EX CREA
TOPICAL_CREAM | Freq: Two times a day (BID) | CUTANEOUS | 0 refills | Status: DC
  Filled 2022-12-21: qty 40, 30d supply, fill #0

## 2022-12-21 MED ORDER — DEXMEDETOMIDINE HCL IN NACL 400 MCG/100ML IV SOLN
0.1000 ug/kg/h | INTRAVENOUS | Status: AC
  Filled 2022-12-21: qty 100

## 2022-12-21 MED ORDER — HEPARIN 30,000 UNITS/1000 ML (OHS) CELLSAVER SOLUTION
Status: AC
  Filled 2022-12-21 (×2): qty 1000

## 2022-12-21 MED ORDER — NOREPINEPHRINE 4 MG/250ML-% IV SOLN
0.0000 ug/min | INTRAVENOUS | Status: AC
  Filled 2022-12-21: qty 250

## 2022-12-21 MED ORDER — POTASSIUM CHLORIDE 2 MEQ/ML IV SOLN
80.0000 meq | INTRAVENOUS | Status: AC
  Filled 2022-12-21 (×2): qty 40

## 2022-12-21 NOTE — Progress Notes (Signed)
   Follow-Up Visit   Subjective  Heather Crawford is a 77 y.o. female who presents for the following: AK  Patient present today for follow up visit for AK. Patient was last evaluated on 08/20/22. Patient reports sxs are unchanged. Patient denies medication changes.  The following portions of the chart were reviewed this encounter and updated as appropriate: medications, allergies, medical history  Review of Systems:  No other skin or systemic complaints except as noted in HPI or Assessment and Plan.  Objective  Well appearing patient in no apparent distress; mood and affect are within normal limits.  A focused examination was performed of the following areas: Face  Relevant exam findings are noted in the Assessment and Plan.  Dorsum of Nose, Right Temple Erythematous thin papules/macules with gritty scale.    Assessment & Plan   ACTINIC KERATOSIS Exam: Erythematous thin papules/macules with gritty scale at the face  Actinic keratoses are precancerous spots that appear secondary to cumulative UV radiation exposure/sun exposure over time. They are chronic with expected duration over 1 year. A portion of actinic keratoses will progress to squamous cell carcinoma of the skin. It is not possible to reliably predict which spots will progress to skin cancer and so treatment is recommended to prevent development of skin cancer.  Recommend daily broad spectrum sunscreen SPF 30+ to sun-exposed areas, reapply every 2 hours as needed.  Recommend staying in the shade or wearing long sleeves, sun glasses (UVA+UVB protection) and wide brim hats (4-inch brim around the entire circumference of the hat). Call for new or changing lesions.  Treatment Plan: Start 5-fluorouracil cream twice a day for 14 days to affected areas including face.  Reviewed course of treatment and expected reaction.  Patient advised to expect inflammation and crusting and advised that erosions are possible.  Patient advised  to be diligent with sun protection during and after treatment. Handout with details of how to apply medication and what to expect provided. Counseled to keep medication out of reach of children and pets.  Reviewed course of treatment and expected reaction.  Patient advised to expect inflammation and crusting and advised that erosions are possible.  Patient advised to be diligent with sun protection during and after treatment. Handout with details of how to apply medication and what to expect provided. Counseled to keep medication out of reach of children and pets.  Actinic keratosis  Related Medications fluorouracil (EFUDEX) 5 % cream Apply topically 2 (two) times daily. START ON JANUARY 1st, 2025  hydrocortisone 2.5 % cream Apply topically 2 (two) times daily.  AK (actinic keratosis) (2) Right Temple; Dorsum of Nose  Destruction of lesion - Dorsum of Nose, Right Temple Complexity: simple   Destruction method: cryotherapy   Informed consent: discussed and consent obtained   Timeout:  patient name, date of birth, surgical site, and procedure verified Lesion destroyed using liquid nitrogen: Yes   Cryotherapy cycles:  3 Post-procedure details: wound care instructions given    Return for AK F/U.  Documentation: I have reviewed the above documentation for accuracy and completeness, and I agree with the above.  Stasia Cavalier, am acting as scribe for Langston Reusing, DO.  Langston Reusing, DO

## 2022-12-21 NOTE — H&P (Signed)
[address]       [place] [address]             [phone]                                   Heather Crawford [facility] Medical Record #[id number] Date of Birth: 04/15/1945   Heather Grandchild, MD Heather Grandchild, MD   Chief Complaint:  Prosthetic aortic valve stenosis    History of Present Illness:     Pt is a pleasant 77 yo female who underwent AVR/Cabgx1 (21mm magna ease/Rima to RCA) in 2012. Pt has been followed recently for progressive prosthetic aortic valve stenosis over the past several years. Recently she has been suffering from increasing DOE. She denies CP or Lightheadedness or lower ext edema. She underwent recent echo with EF 55% and now a mean gradient of . She was noted to have severe MAC and mild MR. Due to her symptoms and stenosis it was felt that she would best be served with valve in valve TAVR. She underwent CTA with femoral access however her ascending aorta was noted to be highly calcified. Also her RCA is low takeoff and cath with patent RIMA to RCA.  Of note, she did in the previous 2 years progress to bradycardia requiring PPM.             Past Medical History:  Diagnosis Date   Allergic rhinitis, cause unspecified     Aortic stenosis      a.  echo 1/12 w/mildly dilated LV, EF 45-50% w/paradoxical septal motion consistent w/ LBBB, moderate aortic stenosis w/mean gradient 27 mmHg, trivial AI, mild to moderate MR w/calcified mitral valve;   b. s/p AVR with 21 mm Magna Ease valve   Aortic valve replaced 2012   Arthritis      joint pain   Bilateral carotid artery disease (HCC) 08/15/2012    Carotid US 11/2021: Bilat ICA 1-39   CAD (coronary artery disease)      a. s/p CABG 4/12: RIMA-RCA   CAD, multiple vessel      sees Dr Shirlee Latch every 3-4 months   Cancer [facility])      skin sarcomas removed   Carotid artery disease (HCC)      Carotid US 10/21: Bilateral ICA 1-39; left subclavian stenosis   CHF (congestive heart failure) (HCC)       chronic diastolic CHF   Chronic back pain      H/O BACK SURGERY   Coronary artery disease      adenosine myoview 2/12 w/EF 56%, ISCHEMIA W/SCAR IN THE MID TO APICAL ANTERIO WALL, SEPTAL WALL, AND APEX. LHC 3/12 W/EF 55%, 40% OSTIAL RCA, 60% MID RCA, 95% DISTAL RC A, INTERVENTION  ATTEMPTED BUT UNABLE TO ADEQUADATELY SEAT  CATHETER.   Diverticulitis of colon (without mention of hemorrhage)(562.11)     Diverticulosis     Esophageal reflux     Glucose intolerance (impaired glucose tolerance)      A1C 6.3 (4/12)   H/O hiatal hernia     H/O vertigo      with fluid  in ears   Heart murmur     Hemorrhoids     Hemorrhoids     History of rheumatic fever     Hx of hysterectomy     Hx: UTI (urinary tract infection)     Hyperlipidemia  LBBB (left bundle branch block)     Myocardial infarction (HCC) 2002   Pneumonia     PONV (postoperative nausea and vomiting)     PPD positive      in the past   Snoring     Squamous cell carcinoma of skin      nose   Stenosis of left subclavian artery (HCC)      Carotid US 10/22: Bilateral ICA 1-39; L subclavian stenosis   Urinary frequency     Valvular heart disease s/p AVR in 2012 02/07/2010    AoV Replacement with Bovine prosthesis ' 12 Echo 2/23: EF 60-65, no RWMA, GR 1 DD, normal RVSF, normal PASP, AVR without aortic stenosis or AI (mean gradient 22 mmHg)               Past Surgical History:  Procedure Laterality Date   ABDOMINAL HYSTERECTOMY       APPENDECTOMY   1973   BACK SURGERY        Lumbar Lam and discectomy x 2   BREAST BIOPSY Right 10/2011   BREAST LUMPECTOMY Right     CARDIAC CATHETERIZATION   2012   CARDIAC VALVE REPLACEMENT   2012    Aortic   CORONARY ARTERY BYPASS GRAFT   2012   DILATION AND CURETTAGE OF UTERUS       ENDOMETRIAL ABLATION       LAPAROSCOPIC LYSIS INTESTINAL ADHESIONS       LUMBAR DISC SURGERY        DR. Deneen Harts- [place]   PACEMAKER IMPLANT N/A 01/17/2020    Procedure: PACEMAKER IMPLANT;  Surgeon:  Regan Lemming, MD;  Location: [facility] INVASIVE CV LAB;  Service: Cardiovascular;  Laterality: N/A;   RIGHT/LEFT HEART CATH AND CORONARY/GRAFT ANGIOGRAPHY N/A 04/07/2021    Procedure: RIGHT/LEFT HEART CATH AND CORONARY/GRAFT ANGIOGRAPHY;  Surgeon: Yvonne Kendall, MD;  Location: [facility] INVASIVE CV LAB;  Service: Cardiovascular;  Laterality: N/A;   RIGHT/LEFT HEART CATH AND CORONARY/GRAFT ANGIOGRAPHY N/A 11/26/2022    Procedure: RIGHT/LEFT HEART CATH AND CORONARY/GRAFT ANGIOGRAPHY;  Surgeon: Tonny Bollman, MD;  Location: [facility]VASIVE CV LAB;  Service: Cardiovascular;  Laterality: N/A;   SKIN SURGERY        skin sarcomas removed   TONSILLECTOMY        1955   TONSILLECTOMY              Tobacco Use History  Social History        Tobacco Use  Smoking Status Former   Current packs/day: 0.00   Average packs/day: 1 pack/day for 40.0 years (40.0 ttl pk-yrs)   Types: Cigarettes   Start date: 02/03/1960   Quit date: 02/03/2000   Years since quitting: 22.8  Smokeless Tobacco Former   Types: Chew  Tobacco Comments    discussed LDCT       Social History        Substance and Sexual Activity  Alcohol Use No   Alcohol/week: 0.0 standard drinks of alcohol      Social History         Socioeconomic History   Marital status: Married      Spouse name: Not on file   Number of children: 2   Years of education: Not on file   Highest education level: Not on file  Occupational History   Occupation: retired      Associate Professor: RETIRED      Comment: [employer], Investment banker, corporate, CERTIFIED AS SPECIAL ED TEACHER  Tobacco Use  Smoking status: Former      Current packs/day: 0.00      Average packs/day: 1 pack/day for 40.0 years (40.0 ttl pk-yrs)      Types: Cigarettes      Start date: 02/03/1960      Quit date: 02/03/2000      Years since quitting: 22.8   Smokeless tobacco: Former      Types: Chew   Tobacco comments:      discussed LDCT   Vaping Use   Vaping status: Never Used  Substance and Sexual  Activity   Alcohol use: No      Alcohol/week: 0.0 standard drinks of alcohol   Drug use: No   Sexual activity: Yes  Other Topics Concern   Not on file  Social History Narrative    MARRIED    1 SON 1 DAUGHTER 2 GRANDCHILDREN    Immunologist, NOW FULL TIME CAREGIVER    LIVES IN [place], GREW UP IN [place].    Social Determinants of Health        Financial Resource Strain: Low Risk  (06/24/2021)    Overall Financial Resource Strain (CARDIA)     Difficulty of Paying Living Expenses: Not hard at all  Food Insecurity: No Food Insecurity (06/24/2021)    Hunger Vital Sign     Worried About Running Out of Food in the Last Year: Never true     Ran Out of Food in the Last Year: Never true  Transportation Needs: No Transportation Needs (06/24/2021)    PRAPARE - Therapist, art (Medical): No     Lack of Transportation (Non-Medical): No  Physical Activity: Sufficiently Active (06/24/2021)    Exercise Vital Sign     Days of Exercise per Week: 5 days     Minutes of Exercise per Session: 30 min  Stress: No Stress Concern Present (06/24/2021)    [employer] of Occupational Health - Occupational Stress Questionnaire     Feeling of Stress : Not at all  Social Connections: Socially Integrated (06/24/2021)    Social Connection and Isolation Panel [NHANES]     Frequency of Communication with Friends and Family: More than three times a week     Frequency of Social Gatherings with Friends and Family: More than three times a week     Attends Religious Services: More than 4 times per year     Active Member of [employer] or Organizations: Yes     Attends Engineer, structural: More than 4 times per year     Marital Status: Married  Catering manager Violence: Not At Risk (06/24/2021)    Humiliation, Afraid, Rape, and Kick questionnaire     Fear of Current or Ex-Partner: No     Emotionally Abused: No     Physically Abused: No     Sexually Abused: No       Allergies       Allergies  Allergen Reactions   Crestor [Rosuvastatin] Other (See Comments)      myalgia   Lipitor [Atorvastatin] Other (See Comments)      myalgia   Sulfamethoxazole-Trimethoprim Hives   Tape Hives      Breakouts from the adhesive in paper tape and patches   Amoxicillin Itching      Did it involve swelling of the face/tongue/throat, SOB, or low BP? No Did it involve sudden or severe rash/hives, skin peeling, or any reaction on the inside of your mouth or nose? No Did you  need to seek medical attention at a hospital or doctor's office? No When did it last happen?   per pt long time ago    If all above answers are "NO", may proceed with cephalosporin use.     Neomycin Itching      Topical neomycin Blister in ears   Neosporin [Neomycin-Bacitracin Zn-Polymyx] Other (See Comments)      Caused blistered   Polysporin [Bacitracin-Polymyxin B] Rash              Current Outpatient Medications  Medication Sig Dispense Refill   acetaminophen (TYLENOL) 325 MG tablet Take 1-2 tablets (325-650 mg total) by mouth every 4 (four) hours as needed for mild pain.       amLODipine (NORVASC) 2.5 MG tablet Take 1 tablet (2.5 mg total) by mouth daily. 90 tablet 3   aspirin EC 81 MG tablet Take 1 tablet (81 mg total) by mouth daily. Swallow whole. 90 tablet 3   azelastine (ASTELIN) 0.1 % nasal spray Place 2 sprays into both nostrils 2 (two) times daily. Use in each nostril as directed 90 mL 1   azithromycin (ZITHROMAX) 500 MG tablet Take 1 tablet (500 mg total) by mouth as directed. Take one tablet 1 hour before any dental work including cleanings. 6 tablet 12   carboxymethylcellulose (REFRESH TEARS) 0.5 % SOLN Place 1 drop into both eyes 3 (three) times daily as needed (Burning eyes).       cetirizine (ZYRTEC) 10 MG tablet Take 10 mg by mouth as needed for allergies.       Cholecalciferol 2000 units TABS Take 1 tablet (2,000 Units total) by mouth daily. (Patient taking differently:  Take 1,000 tablets by mouth daily.) 90 tablet 1   ezetimibe (ZETIA) 10 MG tablet Take 1 tablet (10 mg total) by mouth daily. 90 tablet 3   fluticasone (FLONASE) 50 MCG/ACT nasal spray USE 1 SPRAY IN EACH NOSTRIL EVERY DAY (Patient taking differently: Place 1 spray into both nostrils daily as needed for allergies or rhinitis.) 32 g 5   furosemide (LASIX) 20 MG tablet Take 1 tablet (20 mg total) by mouth daily.       irbesartan (AVAPRO) 150 MG tablet Take 0.5 tablets (75 mg total) by mouth in the morning and at bedtime. 90 tablet 3   isosorbide mononitrate (IMDUR) 30 MG 24 hr tablet Take 1 tablet (30 mg total) by mouth daily. 90 tablet 3   loratadine (CLARITIN) 10 MG tablet Take 10 mg by mouth as needed for allergies.       meclizine (ANTIVERT) 25 MG tablet Take 25 mg by mouth 3 (three) times daily as needed for dizziness. OTC       montelukast (SINGULAIR) 10 MG tablet Take 1 tablet (10 mg total) by mouth at bedtime. 90 tablet 1   nitroGLYCERIN (NITROSTAT) 0.4 MG SL tablet Place 1 tablet (0.4 mg total) under the tongue every 5 (five) minutes as needed for chest pain. 25 tablet 3   omeprazole (PRILOSEC) 40 MG capsule TAKE 1 CAPSULE EVERY DAY (Patient taking differently: Take 40 mg by mouth daily as needed (Heartburn).) 90 capsule 1   simvastatin (ZOCOR) 40 MG tablet Take 1 tablet (40 mg total) by mouth daily. 90 tablet 3      No current facility-administered medications for this visit.               Family History  Problem Relation Age of Onset   Hypertension Mother     Breast cancer  Mother     Hypothyroidism Mother     Diabetes type II Mother     Heart disease Mother     Heart attack Father          CABG @ 72   CAD Father     Breast cancer Sister          x 2   Colon cancer Maternal Aunt     Breast cancer Maternal Aunt     Colon cancer Paternal Uncle     Pancreatic cancer Other          PGA   Breast cancer Paternal Grandmother     Stomach cancer Neg Hx     Throat cancer Neg Hx      Esophageal cancer Neg Hx                  Physical Exam: Lungs: clear Card: RR with 3/6 sem Ext: no edema Neuro: intact         Diagnostic Studies & Laboratory data: I have personally reviewed the following studies and agree with the findings   TTE (10/2022) IMPRESSIONS     1. Left ventricular ejection fraction, by estimation, is 55 to 60%. The  left ventricle has normal function. The left ventricle has no regional  wall motion abnormalities. There is mild left ventricular hypertrophy.  Left ventricular diastolic parameters  are consistent with Grade II diastolic dysfunction (pseudonormalization).  Elevated left atrial pressure.   2. Right ventricular systolic function is normal. The right ventricular  size is normal. There is normal pulmonary artery systolic pressure. The  estimated right ventricular systolic pressure is 26.4 mmHg.   3. The mitral valve is degenerative. Mild mitral valve regurgitation.  Severe mitral annular calcification.   4. The inferior vena cava is normal in size with greater than 50%  respiratory variability, suggesting right atrial pressure of 3 mmHg.   5. The aortic valve has been repaired/replaced. Aortic valve  regurgitation is mild. There is a 21 mm Magna Ease valve present in the  aortic position. Procedure Date: 02/02/2010. Echo findings are consistent  with stenosis of the aortic prosthesis. Vmax 3.8   m/s, MG 35 mmHg, EOA 0.9 cm^2, DI 0.25   FINDINGS   Left Ventricle: Left ventricular ejection fraction, by estimation, is 55  to 60%. The left ventricle has normal function. The left ventricle has no  regional wall motion abnormalities. The left ventricular internal cavity  size was normal in size. There is   mild left ventricular hypertrophy. Left ventricular diastolic parameters  are consistent with Grade II diastolic dysfunction (pseudonormalization).  Elevated left atrial pressure.   Right Ventricle: The right ventricular size is  normal. No increase in  right ventricular wall thickness. Right ventricular systolic function is  normal. There is normal pulmonary artery systolic pressure. The tricuspid  regurgitant velocity is 2.42 m/s, and   with an assumed right atrial pressure of 3 mmHg, the estimated right  ventricular systolic pressure is 26.4 mmHg.   Left Atrium: Left atrial size was normal in size.   Right Atrium: Right atrial size was normal in size.   Mitral Valve: The mitral valve is degenerative in appearance. Severe  mitral annular calcification. Mild mitral valve regurgitation.   Tricuspid Valve: The tricuspid valve is normal in structure. Tricuspid  valve regurgitation is trivial.   Aortic Valve: The aortic valve has been repaired/replaced. Aortic valve  regurgitation is mild. Aortic valve mean gradient measures 35.0 mmHg.  Aortic  valve peak gradient measures 57.8 mmHg. Aortic valve area, by VTI  measures 0.88 cm. There is a 21 mm  Magna valve present in the aortic position. Procedure Date: 02/02/2010.   Pulmonic Valve: The pulmonic valve was not well visualized. Pulmonic valve  regurgitation is not visualized.   Aorta: The aortic root and ascending aorta are structurally normal, with  no evidence of dilitation.   Venous: The inferior vena cava is normal in size with greater than 50%  respiratory variability, suggesting right atrial pressure of 3 mmHg.   LEFT VENTRICLE  PLAX 2D  LVIDd:         4.90 cm   Diastology  LVIDs:         3.40 cm   LV e' medial:    4.35 cm/s  LV PW:         1.10 cm   LV E/e' medial:  27.6  LV IVS:        1.30 cm   LV e' lateral:   6.31 cm/s  LVOT diam:     2.10 cm   LV E/e' lateral: 19.0  LV SV:         91  LV SV Index:   52  LVOT Area:     3.46 cm     RIGHT VENTRICLE  RV Basal diam:  3.50 cm  RV Mid diam:    1.90 cm  RV S prime:     12.10 cm/s  TAPSE (M-mode): 1.5 cm   LEFT ATRIUM             Index        RIGHT ATRIUM           Index  LA diam:        4.30  cm 2.45 cm/m   RA Area:     10.60 cm  LA Vol (A2C):   47.8 ml 27.20 ml/m  RA Volume:   21.30 ml  12.12 ml/m  LA Vol (A4C):   48.9 ml 27.83 ml/m  LA Biplane Vol: 49.7 ml 28.29 ml/m   AORTIC VALVE  AV Area (Vmax):    0.96 cm  AV Area (Vmean):   0.91 cm  AV Area (VTI):     0.88 cm  AV Vmax:           380.00 cm/s  AV Vmean:          265.500 cm/s  AV VTI:            1.040 m  AV Peak Grad:      57.8 mmHg  AV Mean Grad:      35.0 mmHg  LVOT Vmax:         105.00 cm/s  LVOT Vmean:        69.400 cm/s  LVOT VTI:          0.264 m  LVOT/AV VTI ratio: 0.25    AORTA  Ao Root diam: 3.10 cm  Ao Asc diam:  3.40 cm   MITRAL VALVE                TRICUSPID VALVE  MV Area (PHT): 1.84 cm     TR Peak grad:   23.4 mmHg  MV Decel Time: 412 msec     TR Vmax:        242.00 cm/s  MV E velocity: 120.00 cm/s  TR Vmean:       300.0 cm/s  MV A velocity: 128.00 cm/s  MV  E/A ratio:  0.94         SHUNTS                              Systemic VTI:  0.26 m                              Systemic Diam: 2.10 cm    CATH(11/2022) Conclusion   1.  Patent left main, LAD, and left circumflex, with calcification and mild nonobstructive plaquing stable from the previous cardiac catheterization study 2.  Chronic occlusion of the RCA with patent RIMA to RCA graft 3.  Known severe prosthetic aortic stenosis, aortic valve not crossed 4.  Mild pulmonary hypertension with PA pressure 41/24, mean 32 mmHg.  Transpulmonary gradient is 11 mmHg with a PVR of 2.5 Wood units.  Recent Radiology Findings:        Recent Lab Findings: CTA (11/2022) FINDINGS: Aortic valve: There is a 21mm Magna Ease valve in the aortic position. Either a 20mm or 23mm Sapien 3 valve would be options for valve in valve THV implantation. For 23mm Sapien 3, the virtual THV to coronary (VTC) distance measures 7mm to left main and 1mm to RCA. For 20mm Sapien 3, the VTC distance measures 8mm to left main and 2mm to RCA. This suggests elevated risk  of RCA obstruction with valve in valve procedure for both 23mm and 20mm Sapien 3 valve   Right atrium: Normal size   Right ventricle: Mild dilatation   Pulmonary arteries: Normal size   Pulmonary veins: Normal configuration   Left atrium: Mild enlargement   Left ventricle: Normal size   Pericardium: Normal thickness   Coronary arteries: Normal origins. Calcium score 2516 (98th percentile). Patent RIMA-RCA   IMPRESSION: 1. There is a 21mm Magna Ease valve in the aortic position. Either a 20mm or 23mm Sapien 3 valve would be options for valve in valve THV implantation. For 23mm Sapien 3, the virtual THV to coronary (VTC) distance measures 7mm to left main and 1mm to RCA. For 20mm Sapien 3, the VTC distance measures 8mm to left main and 2mm to RCA. This suggests elevated risk of RCA obstruction with valve in valve procedure for both 23mm and 20mm Sapien 3 valve   2.  Coronary calcium score 2516 (98th percentile).  Patent RIMA-RCA Recent Labs       Lab Results  Component Value Date    WBC 6.6 11/19/2022    HGB 10.2 (L) 11/26/2022    HGB 10.2 (L) 11/26/2022    HCT 30.0 (L) 11/26/2022    HCT 30.0 (L) 11/26/2022    PLT 223 11/19/2022    GLUCOSE 130 (H) 11/19/2022    CHOL 149 10/07/2021    TRIG 185 (H) 10/07/2021    HDL 50 10/07/2021    LDLDIRECT 76.0 06/24/2020    LDLCALC 68 10/07/2021    ALT 17 10/07/2021    AST 19 10/07/2021    NA 142 11/26/2022    NA 142 11/26/2022    K 3.8 11/26/2022    K 3.8 11/26/2022    CL 100 11/19/2022    CREATININE 0.96 11/19/2022    BUN 20 11/19/2022    CO2 24 11/19/2022    TSH 3.03 07/06/2022    INR 3.1 06/18/2010    HGBA1C 6.8 (H) 07/06/2022            Assessment /  Plan:     77 yo female with NYHA class 2-3 symptoms of severe prosthetic aortic valve stenosis with patent RIMA to RCA and highly calcified ascending aorta. Pt has a class I indication to proceed with redo AVR but due to her being not a surgical candidate, would best  be served with valve in valve TAVR with a 23mm Sapien via the femoral access with Sentinal protection device. All the risks and goals of surgery along with recovery discussed. Pt quite concerned over her ability to leave after one day due to her concerns on what she will need to do at home after with little help from family she has noted even following her cath.

## 2022-12-21 NOTE — Patient Instructions (Addendum)
Hello Heather Crawford,  Thank you for visiting my office today. Your dedication to managing your skin health, particularly in light of your upcoming heart surgery, is greatly appreciated. Below is a summary of our discussed treatment plan and instructions:  - Cryotherapy: The precancerous spots on your skin were treated with cryotherapy today to initiate immediate treatment.  - Topical Treatment:   - Efudex Cream: Starting in January, after the holidays, begin using Efudex cream on your entire face twice daily for two weeks. This cream is essential for treating and preventing precancerous cells.     - Duration: Ideally, continue the treatment for two weeks. If the reaction is strong after one week, you may stop early.     - Post-Treatment: Following the Efudex treatment, apply hydrocortisone 2.5% ointment to aid in healing.  - Post-Treatment Care:   - Moisturization: Keep the treated areas moisturized with a bit of Vaseline.   - Healing: Post-treatment, the skin may appear pink for six to eight weeks but is expected to heal with an improved appearance.  - Medication Pickup: The Efudex cream and hydrocortisone ointment will be available at your pharmacy for pickup at your convenience.  Please take great care during your upcoming surgery. Do not hesitate to reach out if you have any questions or require further assistance with your dermatological care.  Wishing you a smooth procedure and swift recovery.  Warm regards,  Dr. [facility]    Cryotherapy Aftercare  Wash gently with soap and water everyday.   Apply Vaseline and Band-Aid daily until healed.   Important Information   Due to recent changes in healthcare laws, you may see results of your pathology and/or laboratory studies on MyChart before the doctors have had a chance to review them. We understand that in some cases there may be results that are confusing or concerning to you. Please understand that not all results  are received at the same time and often the doctors may need to interpret multiple results in order to provide you with the best plan of care or course of treatment. Therefore, we ask that you please give [place] 2 business days to thoroughly review all your results before contacting the office for clarification. Should we see a critical lab result, you will be contacted sooner.     If You Need Anything After Your Visit   If you have any questions or concerns for your doctor, please call our main line at [phone]. If no one answers, please leave a voicemail as directed and we will return your call as soon as possible. Messages left after 4 pm will be answered the following business day.    You may also send [place] a message via MyChart. We typically respond to MyChart messages within 1-2 business days.  For prescription refills, please ask your pharmacy to contact our office. Our fax number is [phone].  If you have an urgent issue when the clinic is closed that cannot wait until the next business day, you can page your doctor at the number below.     Please note that while we do our best to be available for urgent issues outside of office hours, we are not available 24/7.    If you have an urgent issue and are unable to reach [place], you may choose to seek medical care at your doctor's office, retail clinic, urgent care center, or emergency room.   If you have a medical emergency, please immediately call 911 or go to the  emergency department. In the event of inclement weather, please call our main line at [phone] for an update on the status of any delays or closures.  Dermatology Medication Tips: Please keep the boxes that topical medications come in in order to help keep track of the instructions about where and how to use these. Pharmacies typically print the medication instructions only on the boxes and not directly on the medication tubes.   If your medication is too expensive, please  contact our office at [phone] or send [place] a message through MyChart.    We are unable to tell what your co-pay for medications will be in advance as this is different depending on your insurance coverage. However, we may be able to find a substitute medication at lower cost or fill out paperwork to get insurance to cover a needed medication.    If a prior authorization is required to get your medication covered by your insurance company, please allow [place] 1-2 business days to complete this process.   Drug prices often vary depending on where the prescription is filled and some pharmacies may offer cheaper prices.   The website www.goodrx.com contains coupons for medications through different pharmacies. The prices here do not account for what the cost may be with help from insurance (it may be cheaper with your insurance), but the website can give you the price if you did not use any insurance.  - You can print the associated coupon and take it with your prescription to the pharmacy.  - You may also stop by our office during regular business hours and pick up a GoodRx coupon card.  - If you need your prescription sent electronically to a different pharmacy, notify our office through [facility] or by phone at [phone]

## 2022-12-21 NOTE — Progress Notes (Signed)
 Patient signed all consents at PAT lab appointment. CHG soap and instructions were given to patient. CHG surgical prep reviewed with patient and all questions answered.  Patients chart send to anesthesia for review. Pt denies any respiratory illness/infection in the last two months.

## 2022-12-22 ENCOUNTER — Inpatient Hospital Stay (HOSPITAL_COMMUNITY): Admission: RE | Admit: 2022-12-22 | Payer: HMO | Source: Home / Self Care | Admitting: Cardiovascular Disease

## 2022-12-22 ENCOUNTER — Encounter (HOSPITAL_COMMUNITY): Payer: Self-pay

## 2022-12-22 ENCOUNTER — Inpatient Hospital Stay (HOSPITAL_COMMUNITY): Payer: HMO

## 2022-12-22 ENCOUNTER — Other Ambulatory Visit: Payer: Self-pay

## 2022-12-22 DIAGNOSIS — I350 Nonrheumatic aortic (valve) stenosis: Secondary | ICD-10-CM

## 2022-12-22 SURGERY — TRANSCATHETER AORTIC VALVE REPLACEMENT, TRANSFEMORAL (CATHLAB)
Anesthesia: Monitor Anesthesia Care

## 2022-12-23 ENCOUNTER — Other Ambulatory Visit: Payer: Self-pay

## 2022-12-23 DIAGNOSIS — I350 Nonrheumatic aortic (valve) stenosis: Secondary | ICD-10-CM

## 2022-12-23 LAB — HM DIABETES EYE EXAM

## 2022-12-28 ENCOUNTER — Ambulatory Visit: Payer: HMO

## 2022-12-30 ENCOUNTER — Encounter: Payer: Self-pay | Admitting: Cardiology

## 2022-12-30 NOTE — Progress Notes (Signed)
PERIOPERATIVE PRESCRIPTION FOR IMPLANTED CARDIAC DEVICE PROGRAMMING  Patient Information: Name:  Heather Crawford  DOB:  03-21-1945  MRN:  [id number]    Planned Procedure:  TAVR with Cerebral Embolic Protection  Surgeon:  Dr. Tonny Bollman and Dr. Eugenio Hoes  Date of Procedure:  01/05/2023  Cautery will be used.  Position during surgery:  Supine   Please send documentation back to:  [facility] (Fax # [phone])  Device Information:  Clinic EP Physician:  Loman Brooklyn, MD   Device Type:  Pacemaker Manufacturer and Phone #:  Medtronic: [id number] Pacemaker Dependent?:  No. Date of Last Device Check:  10/15/2022 Normal Device Function?:  Yes.    Electrophysiologist's Recommendations:  Have magnet available. Provide continuous ECG monitoring when magnet is used or reprogramming is to be performed.  Procedure will likely interfere with device function.  Device should be programmed:  Asynchronous pacing during procedure and returned to normal programming after procedure  Per Device Clinic Standing Orders, Lenor Coffin, RN  3:24 PM 12/30/2022

## 2023-01-04 ENCOUNTER — Encounter (HOSPITAL_COMMUNITY)
Admission: RE | Admit: 2023-01-04 | Discharge: 2023-01-04 | Disposition: A | Discharge status: Home / Self Care | Payer: HMO | Source: Ambulatory Visit | Attending: Cardiovascular Disease | Admitting: Cardiovascular Disease

## 2023-01-04 ENCOUNTER — Ambulatory Visit (HOSPITAL_COMMUNITY)
Admission: RE | Admit: 2023-01-04 | Discharge: 2023-01-04 | Disposition: A | Discharge status: Home / Self Care | Payer: HMO | Source: Ambulatory Visit | Attending: Cardiovascular Disease | Admitting: Cardiovascular Disease

## 2023-01-04 ENCOUNTER — Other Ambulatory Visit: Payer: Self-pay

## 2023-01-04 DIAGNOSIS — Z951 Presence of aortocoronary bypass graft: Secondary | ICD-10-CM | POA: Diagnosis not present

## 2023-01-04 DIAGNOSIS — N1831 Chronic kidney disease, stage 3a: Secondary | ICD-10-CM | POA: Diagnosis not present

## 2023-01-04 DIAGNOSIS — Z87891 Personal history of nicotine dependence: Secondary | ICD-10-CM | POA: Diagnosis not present

## 2023-01-04 DIAGNOSIS — Z85828 Personal history of other malignant neoplasm of skin: Secondary | ICD-10-CM | POA: Diagnosis not present

## 2023-01-04 DIAGNOSIS — I060 Rheumatic aortic stenosis: Secondary | ICD-10-CM | POA: Diagnosis not present

## 2023-01-04 DIAGNOSIS — I700 Atherosclerosis of aorta: Secondary | ICD-10-CM | POA: Insufficient documentation

## 2023-01-04 DIAGNOSIS — I708 Atherosclerosis of other arteries: Secondary | ICD-10-CM | POA: Diagnosis not present

## 2023-01-04 DIAGNOSIS — Z01818 Encounter for other preprocedural examination: Secondary | ICD-10-CM | POA: Insufficient documentation

## 2023-01-04 DIAGNOSIS — T82857A Stenosis of cardiac prosthetic devices, implants and grafts, initial encounter: Secondary | ICD-10-CM | POA: Diagnosis not present

## 2023-01-04 DIAGNOSIS — I350 Nonrheumatic aortic (valve) stenosis: Secondary | ICD-10-CM | POA: Diagnosis not present

## 2023-01-04 DIAGNOSIS — E785 Hyperlipidemia, unspecified: Secondary | ICD-10-CM | POA: Diagnosis not present

## 2023-01-04 DIAGNOSIS — Z006 Encounter for examination for normal comparison and control in clinical research program: Secondary | ICD-10-CM | POA: Diagnosis not present

## 2023-01-04 DIAGNOSIS — R9431 Abnormal electrocardiogram [ECG] [EKG]: Secondary | ICD-10-CM | POA: Insufficient documentation

## 2023-01-04 DIAGNOSIS — I5032 Chronic diastolic (congestive) heart failure: Secondary | ICD-10-CM | POA: Diagnosis not present

## 2023-01-04 DIAGNOSIS — I130 Hypertensive heart and chronic kidney disease with heart failure and stage 1 through stage 4 chronic kidney disease, or unspecified chronic kidney disease: Secondary | ICD-10-CM | POA: Diagnosis not present

## 2023-01-04 DIAGNOSIS — E1122 Type 2 diabetes mellitus with diabetic chronic kidney disease: Secondary | ICD-10-CM | POA: Diagnosis not present

## 2023-01-04 LAB — URINALYSIS, ROUTINE W REFLEX MICROSCOPIC
Bacteria, UA: NONE SEEN
Bilirubin Urine: NEGATIVE
Glucose, UA: NEGATIVE mg/dL
Hgb urine dipstick: NEGATIVE
Ketones, ur: NEGATIVE mg/dL
Nitrite: NEGATIVE
Protein, ur: NEGATIVE mg/dL
Specific Gravity, Urine: 1.005 (ref 1.005–1.030)
pH: 6 (ref 5.0–8.0)

## 2023-01-04 LAB — COMPREHENSIVE METABOLIC PANEL
ALT: 19 U/L (ref 0–44)
AST: 19 U/L (ref 15–41)
Albumin: 4 g/dL (ref 3.5–5.0)
Alkaline Phosphatase: 49 U/L (ref 38–126)
Anion gap: 11 (ref 5–15)
BUN: 19 mg/dL (ref 8–23)
CO2: 28 mmol/L (ref 22–32)
Calcium: 9.2 mg/dL (ref 8.9–10.3)
Chloride: 101 mmol/L (ref 98–111)
Creatinine, Ser: 0.79 mg/dL (ref 0.44–1.00)
GFR, Estimated: >60 mL/min (ref >60)
Glucose, Bld: 184 mg/dL — ABNORMAL HIGH (ref 70–99)
Potassium: 3.8 mmol/L (ref 3.5–5.1)
Sodium: 140 mmol/L (ref 135–145)
Total Bilirubin: 0.5 mg/dL (ref <1.2)
Total Protein: 7 g/dL (ref 6.5–8.1)

## 2023-01-04 LAB — CBC
HCT: 37.4 % (ref 36.0–46.0)
Hemoglobin: 11.8 g/dL — ABNORMAL LOW (ref 12.0–15.0)
MCH: 28.7 pg (ref 26.0–34.0)
MCHC: 31.6 g/dL (ref 30.0–36.0)
MCV: 91 fL (ref 80.0–100.0)
Platelets: 221 10*3/uL (ref 150–400)
RBC: 4.11 MIL/uL (ref 3.87–5.11)
RDW: 12.8 % (ref 11.5–15.5)
WBC: 7.2 10*3/uL (ref 4.0–10.5)
nRBC: 0 % (ref 0.0–0.2)

## 2023-01-04 LAB — TYPE AND SCREEN
ABO/RH(D): A POS
Antibody Screen: NEGATIVE

## 2023-01-04 LAB — PROTIME-INR
INR: 1 (ref 0.8–1.2)
Prothrombin Time: 13.2 s (ref 11.4–15.2)

## 2023-01-04 LAB — SURGICAL PCR SCREEN
MRSA, PCR: NEGATIVE
Staphylococcus aureus: NEGATIVE

## 2023-01-04 LAB — SARS CORONAVIRUS 2 BY RT PCR: SARS Coronavirus 2 by RT PCR: NEGATIVE

## 2023-01-04 MED ORDER — HEPARIN 30,000 UNITS/1000 ML (OHS) CELLSAVER SOLUTION
Status: DC
  Filled 2023-01-04 (×2): qty 1000

## 2023-01-04 MED ORDER — POTASSIUM CHLORIDE 2 MEQ/ML IV SOLN
80.0000 meq | INTRAVENOUS | Status: DC
  Filled 2023-01-04 (×2): qty 40

## 2023-01-04 MED ORDER — CEFAZOLIN SODIUM-DEXTROSE 2-4 GM/100ML-% IV SOLN
2.0000 g | INTRAVENOUS | Status: AC
  Administered 2023-01-05: 2 g via INTRAVENOUS
  Filled 2023-01-04 (×2): qty 100

## 2023-01-04 MED ORDER — MAGNESIUM SULFATE 50 % IJ SOLN
40.0000 meq | INTRAMUSCULAR | Status: DC
  Filled 2023-01-04 (×2): qty 9.85

## 2023-01-04 MED ORDER — NOREPINEPHRINE 4 MG/250ML-% IV SOLN
0.0000 ug/min | INTRAVENOUS | Status: AC
  Administered 2023-01-05: 1 ug/min via INTRAVENOUS
  Filled 2023-01-04: qty 250

## 2023-01-04 MED ORDER — DEXMEDETOMIDINE HCL IN NACL 400 MCG/100ML IV SOLN
0.1000 ug/kg/h | INTRAVENOUS | Status: AC
  Administered 2023-01-05 (×2): 36.75 ug via INTRAVENOUS
  Administered 2023-01-05: 1 ug/kg/h via INTRAVENOUS
  Filled 2023-01-04: qty 100

## 2023-01-04 NOTE — Anesthesia Preprocedure Evaluation (Signed)
Anesthesia Evaluation  Patient identified by MRN, date of birth, ID band Patient awake    Reviewed: Allergy & Precautions, NPO status , Patient's Chart, lab work & pertinent test results  History of Anesthesia Complications (+) PONV and history of anesthetic complications  Airway Mallampati: II  TM Distance: >3 FB Neck ROM: Full    Dental no notable dental hx.    Pulmonary former smoker   Pulmonary exam normal        Cardiovascular hypertension, + CAD, + Past MI, + CABG, + Peripheral Vascular Disease and +CHF  + dysrhythmias + pacemaker + Valvular Problems/Murmurs AS  Rhythm:Regular Rate:Normal + Systolic murmurs    Neuro/Psych negative neurological ROS  negative psych ROS   GI/Hepatic Neg liver ROS, hiatal hernia,GERD  ,,  Endo/Other    Renal/GU   negative genitourinary   Musculoskeletal  (+) Arthritis , Osteoarthritis,    Abdominal Normal abdominal exam  (+)   Peds  Hematology   Anesthesia Other Findings   Reproductive/Obstetrics                             Anesthesia Physical Anesthesia Plan  ASA: 4  Anesthesia Plan: MAC   Post-op Pain Management:    Induction: Intravenous  PONV Risk Score and Plan: 3 and Ondansetron, Dexamethasone and Treatment may vary due to age or medical condition  Airway Management Planned: Simple Face Mask and Nasal Cannula  Additional Equipment: None  Intra-op Plan:   Post-operative Plan:   Informed Consent: I have reviewed the patients History and Physical, chart, labs and discussed the procedure including the risks, benefits and alternatives for the proposed anesthesia with the patient or authorized representative who has indicated his/her understanding and acceptance.     Dental advisory given  Plan Discussed with: CRNA  Anesthesia Plan Comments:        Anesthesia Quick Evaluation

## 2023-01-04 NOTE — H&P (Signed)
 [address]       [place] [address]             [phone]                                   CHELCEY SHOLTZ [facility] Medical Record #[id number] Date of Birth: 04/15/1945   [name], MD [name], MD   Chief Complaint:  Prosthetic aortic valve stenosis    History of Present Illness:     Pt is a pleasant 77 yo female who underwent AVR/Cabgx1 (21mm magna ease/Rima to RCA) in 2012. Pt has been followed recently for progressive prosthetic aortic valve stenosis over the past several years. Recently she has been suffering from increasing DOE. She denies CP or Lightheadedness or lower ext edema. She underwent recent echo with EF 55% and now a mean gradient of . She was noted to have severe MAC and mild MR. Due to her symptoms and stenosis it was felt that she would best be served with valve in valve TAVR. She underwent CTA with femoral access however her ascending aorta was noted to be highly calcified. Also her RCA is low takeoff and cath with patent RIMA to RCA.  Of note, she did in the previous 2 years progress to bradycardia requiring PPM.             Past Medical History:  Diagnosis Date   Allergic rhinitis, cause unspecified     Aortic stenosis      a.  echo 1/12 w/mildly dilated LV, EF 45-50% w/paradoxical septal motion consistent w/ LBBB, moderate aortic stenosis w/mean gradient 27 mmHg, trivial AI, mild to moderate MR w/calcified mitral valve;   b. s/p AVR with 21 mm Magna Ease valve   Aortic valve replaced 2012   Arthritis      joint pain   Bilateral carotid artery disease (HCC) 08/15/2012    Carotid US 11/2021: Bilat ICA 1-39   CAD (coronary artery disease)      a. s/p CABG 4/12: RIMA-RCA   CAD, multiple vessel      sees Dr Shirlee Latch every 3-4 months   Cancer [facility])      skin sarcomas removed   Carotid artery disease (HCC)      Carotid US 10/21: Bilateral ICA 1-39; left subclavian stenosis   CHF (congestive heart failure) (HCC)       chronic diastolic CHF   Chronic back pain      H/O BACK SURGERY   Coronary artery disease      adenosine myoview 2/12 w/EF 56%, ISCHEMIA W/SCAR IN THE MID TO APICAL ANTERIO WALL, SEPTAL WALL, AND APEX. LHC 3/12 W/EF 55%, 40% OSTIAL RCA, 60% MID RCA, 95% DISTAL RC A, INTERVENTION  ATTEMPTED BUT UNABLE TO ADEQUADATELY SEAT  CATHETER.   Diverticulitis of colon (without mention of hemorrhage)(562.11)     Diverticulosis     Esophageal reflux     Glucose intolerance (impaired glucose tolerance)      A1C 6.3 (4/12)   H/O hiatal hernia     H/O vertigo      with fluid  in ears   Heart murmur     Hemorrhoids     Hemorrhoids     History of rheumatic fever     Hx of hysterectomy     Hx: UTI (urinary tract infection)     Hyperlipidemia  LBBB (left bundle branch block)     Myocardial infarction (HCC) 2002   Pneumonia     PONV (postoperative nausea and vomiting)     PPD positive      in the past   Snoring     Squamous cell carcinoma of skin      nose   Stenosis of left subclavian artery (HCC)      Carotid US 10/22: Bilateral ICA 1-39; L subclavian stenosis   Urinary frequency     Valvular heart disease s/p AVR in 2012 02/07/2010    AoV Replacement with Bovine prosthesis ' 12 Echo 2/23: EF 60-65, no RWMA, GR 1 DD, normal RVSF, normal PASP, AVR without aortic stenosis or AI (mean gradient 22 mmHg)               Past Surgical History:  Procedure Laterality Date   ABDOMINAL HYSTERECTOMY       APPENDECTOMY   1973   BACK SURGERY        Lumbar Lam and discectomy x 2   BREAST BIOPSY Right 10/2011   BREAST LUMPECTOMY Right     CARDIAC CATHETERIZATION   2012   CARDIAC VALVE REPLACEMENT   2012    Aortic   CORONARY ARTERY BYPASS GRAFT   2012   DILATION AND CURETTAGE OF UTERUS       ENDOMETRIAL ABLATION       LAPAROSCOPIC LYSIS INTESTINAL ADHESIONS       LUMBAR DISC SURGERY        DR. Deneen Harts- [place]   PACEMAKER IMPLANT N/A 01/17/2020    Procedure: PACEMAKER IMPLANT;  Surgeon:  Regan Lemming, MD;  Location: [facility] INVASIVE CV LAB;  Service: Cardiovascular;  Laterality: N/A;   RIGHT/LEFT HEART CATH AND CORONARY/GRAFT ANGIOGRAPHY N/A 04/07/2021    Procedure: RIGHT/LEFT HEART CATH AND CORONARY/GRAFT ANGIOGRAPHY;  Surgeon: Yvonne Kendall, MD;  Location: [facility] INVASIVE CV LAB;  Service: Cardiovascular;  Laterality: N/A;   RIGHT/LEFT HEART CATH AND CORONARY/GRAFT ANGIOGRAPHY N/A 11/26/2022    Procedure: RIGHT/LEFT HEART CATH AND CORONARY/GRAFT ANGIOGRAPHY;  Surgeon: Tonny Bollman, MD;  Location: [facility]VASIVE CV LAB;  Service: Cardiovascular;  Laterality: N/A;   SKIN SURGERY        skin sarcomas removed   TONSILLECTOMY        1955   TONSILLECTOMY              Tobacco Use History  Social History        Tobacco Use  Smoking Status Former   Current packs/day: 0.00   Average packs/day: 1 pack/day for 40.0 years (40.0 ttl pk-yrs)   Types: Cigarettes   Start date: 02/03/1960   Quit date: 02/03/2000   Years since quitting: 22.8  Smokeless Tobacco Former   Types: Chew  Tobacco Comments    discussed LDCT       Social History        Substance and Sexual Activity  Alcohol Use No   Alcohol/week: 0.0 standard drinks of alcohol      Social History         Socioeconomic History   Marital status: Married      Spouse name: Not on file   Number of children: 2   Years of education: Not on file   Highest education level: Not on file  Occupational History   Occupation: retired      Associate Professor: RETIRED      Comment: [employer], Investment banker, corporate, CERTIFIED AS SPECIAL ED TEACHER  Tobacco Use  Smoking status: Former      Current packs/day: 0.00      Average packs/day: 1 pack/day for 40.0 years (40.0 ttl pk-yrs)      Types: Cigarettes      Start date: 02/03/1960      Quit date: 02/03/2000      Years since quitting: 22.8   Smokeless tobacco: Former      Types: Chew   Tobacco comments:      discussed LDCT   Vaping Use   Vaping status: Never Used  Substance and Sexual  Activity   Alcohol use: No      Alcohol/week: 0.0 standard drinks of alcohol   Drug use: No   Sexual activity: Yes  Other Topics Concern   Not on file  Social History Narrative    MARRIED    1 SON 1 DAUGHTER 2 GRANDCHILDREN    Immunologist, NOW FULL TIME CAREGIVER    LIVES IN [place], GREW UP IN [place].    Social Determinants of Health        Financial Resource Strain: Low Risk  (06/24/2021)    Overall Financial Resource Strain (CARDIA)     Difficulty of Paying Living Expenses: Not hard at all  Food Insecurity: No Food Insecurity (06/24/2021)    Hunger Vital Sign     Worried About Running Out of Food in the Last Year: Never true     Ran Out of Food in the Last Year: Never true  Transportation Needs: No Transportation Needs (06/24/2021)    PRAPARE - Therapist, art (Medical): No     Lack of Transportation (Non-Medical): No  Physical Activity: Sufficiently Active (06/24/2021)    Exercise Vital Sign     Days of Exercise per Week: 5 days     Minutes of Exercise per Session: 30 min  Stress: No Stress Concern Present (06/24/2021)    [employer] of Occupational Health - Occupational Stress Questionnaire     Feeling of Stress : Not at all  Social Connections: Socially Integrated (06/24/2021)    Social Connection and Isolation Panel [NHANES]     Frequency of Communication with Friends and Family: More than three times a week     Frequency of Social Gatherings with Friends and Family: More than three times a week     Attends Religious Services: More than 4 times per year     Active Member of [employer] or Organizations: Yes     Attends Engineer, structural: More than 4 times per year     Marital Status: Married  Catering manager Violence: Not At Risk (06/24/2021)    Humiliation, Afraid, Rape, and Kick questionnaire     Fear of Current or Ex-Partner: No     Emotionally Abused: No     Physically Abused: No     Sexually Abused: No       Allergies       Allergies  Allergen Reactions   Crestor [Rosuvastatin] Other (See Comments)      myalgia   Lipitor [Atorvastatin] Other (See Comments)      myalgia   Sulfamethoxazole-Trimethoprim Hives   Tape Hives      Breakouts from the adhesive in paper tape and patches   Amoxicillin Itching      Did it involve swelling of the face/tongue/throat, SOB, or low BP? No Did it involve sudden or severe rash/hives, skin peeling, or any reaction on the inside of your mouth or nose? No Did you  need to seek medical attention at a hospital or doctor's office? No When did it last happen?   per pt long time ago    If all above answers are "NO", may proceed with cephalosporin use.     Neomycin Itching      Topical neomycin Blister in ears   Neosporin [Neomycin-Bacitracin Zn-Polymyx] Other (See Comments)      Caused blistered   Polysporin [Bacitracin-Polymyxin B] Rash              Current Outpatient Medications  Medication Sig Dispense Refill   acetaminophen (TYLENOL) 325 MG tablet Take 1-2 tablets (325-650 mg total) by mouth every 4 (four) hours as needed for mild pain.       amLODipine (NORVASC) 2.5 MG tablet Take 1 tablet (2.5 mg total) by mouth daily. 90 tablet 3   aspirin EC 81 MG tablet Take 1 tablet (81 mg total) by mouth daily. Swallow whole. 90 tablet 3   azelastine (ASTELIN) 0.1 % nasal spray Place 2 sprays into both nostrils 2 (two) times daily. Use in each nostril as directed 90 mL 1   azithromycin (ZITHROMAX) 500 MG tablet Take 1 tablet (500 mg total) by mouth as directed. Take one tablet 1 hour before any dental work including cleanings. 6 tablet 12   carboxymethylcellulose (REFRESH TEARS) 0.5 % SOLN Place 1 drop into both eyes 3 (three) times daily as needed (Burning eyes).       cetirizine (ZYRTEC) 10 MG tablet Take 10 mg by mouth as needed for allergies.       Cholecalciferol 2000 units TABS Take 1 tablet (2,000 Units total) by mouth daily. (Patient taking differently:  Take 1,000 tablets by mouth daily.) 90 tablet 1   ezetimibe (ZETIA) 10 MG tablet Take 1 tablet (10 mg total) by mouth daily. 90 tablet 3   fluticasone (FLONASE) 50 MCG/ACT nasal spray USE 1 SPRAY IN EACH NOSTRIL EVERY DAY (Patient taking differently: Place 1 spray into both nostrils daily as needed for allergies or rhinitis.) 32 g 5   furosemide (LASIX) 20 MG tablet Take 1 tablet (20 mg total) by mouth daily.       irbesartan (AVAPRO) 150 MG tablet Take 0.5 tablets (75 mg total) by mouth in the morning and at bedtime. 90 tablet 3   isosorbide mononitrate (IMDUR) 30 MG 24 hr tablet Take 1 tablet (30 mg total) by mouth daily. 90 tablet 3   loratadine (CLARITIN) 10 MG tablet Take 10 mg by mouth as needed for allergies.       meclizine (ANTIVERT) 25 MG tablet Take 25 mg by mouth 3 (three) times daily as needed for dizziness. OTC       montelukast (SINGULAIR) 10 MG tablet Take 1 tablet (10 mg total) by mouth at bedtime. 90 tablet 1   nitroGLYCERIN (NITROSTAT) 0.4 MG SL tablet Place 1 tablet (0.4 mg total) under the tongue every 5 (five) minutes as needed for chest pain. 25 tablet 3   omeprazole (PRILOSEC) 40 MG capsule TAKE 1 CAPSULE EVERY DAY (Patient taking differently: Take 40 mg by mouth daily as needed (Heartburn).) 90 capsule 1   simvastatin (ZOCOR) 40 MG tablet Take 1 tablet (40 mg total) by mouth daily. 90 tablet 3      No current facility-administered medications for this visit.               Family History  Problem Relation Age of Onset   Hypertension Mother     Breast cancer  Mother     Hypothyroidism Mother     Diabetes type II Mother     Heart disease Mother     Heart attack Father          CABG @ 72   CAD Father     Breast cancer Sister          x 2   Colon cancer Maternal Aunt     Breast cancer Maternal Aunt     Colon cancer Paternal Uncle     Pancreatic cancer Other          PGA   Breast cancer Paternal Grandmother     Stomach cancer Neg Hx     Throat cancer Neg Hx      Esophageal cancer Neg Hx                  Physical Exam: Lungs: clear Card: RR with 3/6 sem Ext: no edema Neuro: intact         Diagnostic Studies & Laboratory data: I have personally reviewed the following studies and agree with the findings   TTE (10/2022) IMPRESSIONS     1. Left ventricular ejection fraction, by estimation, is 55 to 60%. The  left ventricle has normal function. The left ventricle has no regional  wall motion abnormalities. There is mild left ventricular hypertrophy.  Left ventricular diastolic parameters  are consistent with Grade II diastolic dysfunction (pseudonormalization).  Elevated left atrial pressure.   2. Right ventricular systolic function is normal. The right ventricular  size is normal. There is normal pulmonary artery systolic pressure. The  estimated right ventricular systolic pressure is 26.4 mmHg.   3. The mitral valve is degenerative. Mild mitral valve regurgitation.  Severe mitral annular calcification.   4. The inferior vena cava is normal in size with greater than 50%  respiratory variability, suggesting right atrial pressure of 3 mmHg.   5. The aortic valve has been repaired/replaced. Aortic valve  regurgitation is mild. There is a 21 mm Magna Ease valve present in the  aortic position. Procedure Date: 02/02/2010. Echo findings are consistent  with stenosis of the aortic prosthesis. Vmax 3.8   m/s, MG 35 mmHg, EOA 0.9 cm^2, DI 0.25   FINDINGS   Left Ventricle: Left ventricular ejection fraction, by estimation, is 55  to 60%. The left ventricle has normal function. The left ventricle has no  regional wall motion abnormalities. The left ventricular internal cavity  size was normal in size. There is   mild left ventricular hypertrophy. Left ventricular diastolic parameters  are consistent with Grade II diastolic dysfunction (pseudonormalization).  Elevated left atrial pressure.   Right Ventricle: The right ventricular size is  normal. No increase in  right ventricular wall thickness. Right ventricular systolic function is  normal. There is normal pulmonary artery systolic pressure. The tricuspid  regurgitant velocity is 2.42 m/s, and   with an assumed right atrial pressure of 3 mmHg, the estimated right  ventricular systolic pressure is 26.4 mmHg.   Left Atrium: Left atrial size was normal in size.   Right Atrium: Right atrial size was normal in size.   Mitral Valve: The mitral valve is degenerative in appearance. Severe  mitral annular calcification. Mild mitral valve regurgitation.   Tricuspid Valve: The tricuspid valve is normal in structure. Tricuspid  valve regurgitation is trivial.   Aortic Valve: The aortic valve has been repaired/replaced. Aortic valve  regurgitation is mild. Aortic valve mean gradient measures 35.0 mmHg.  Aortic  valve peak gradient measures 57.8 mmHg. Aortic valve area, by VTI  measures 0.88 cm. There is a 21 mm  Magna valve present in the aortic position. Procedure Date: 02/02/2010.   Pulmonic Valve: The pulmonic valve was not well visualized. Pulmonic valve  regurgitation is not visualized.   Aorta: The aortic root and ascending aorta are structurally normal, with  no evidence of dilitation.   Venous: The inferior vena cava is normal in size with greater than 50%  respiratory variability, suggesting right atrial pressure of 3 mmHg.   LEFT VENTRICLE  PLAX 2D  LVIDd:         4.90 cm   Diastology  LVIDs:         3.40 cm   LV e' medial:    4.35 cm/s  LV PW:         1.10 cm   LV E/e' medial:  27.6  LV IVS:        1.30 cm   LV e' lateral:   6.31 cm/s  LVOT diam:     2.10 cm   LV E/e' lateral: 19.0  LV SV:         91  LV SV Index:   52  LVOT Area:     3.46 cm     RIGHT VENTRICLE  RV Basal diam:  3.50 cm  RV Mid diam:    1.90 cm  RV S prime:     12.10 cm/s  TAPSE (M-mode): 1.5 cm   LEFT ATRIUM             Index        RIGHT ATRIUM           Index  LA diam:        4.30  cm 2.45 cm/m   RA Area:     10.60 cm  LA Vol (A2C):   47.8 ml 27.20 ml/m  RA Volume:   21.30 ml  12.12 ml/m  LA Vol (A4C):   48.9 ml 27.83 ml/m  LA Biplane Vol: 49.7 ml 28.29 ml/m   AORTIC VALVE  AV Area (Vmax):    0.96 cm  AV Area (Vmean):   0.91 cm  AV Area (VTI):     0.88 cm  AV Vmax:           380.00 cm/s  AV Vmean:          265.500 cm/s  AV VTI:            1.040 m  AV Peak Grad:      57.8 mmHg  AV Mean Grad:      35.0 mmHg  LVOT Vmax:         105.00 cm/s  LVOT Vmean:        69.400 cm/s  LVOT VTI:          0.264 m  LVOT/AV VTI ratio: 0.25    AORTA  Ao Root diam: 3.10 cm  Ao Asc diam:  3.40 cm   MITRAL VALVE                TRICUSPID VALVE  MV Area (PHT): 1.84 cm     TR Peak grad:   23.4 mmHg  MV Decel Time: 412 msec     TR Vmax:        242.00 cm/s  MV E velocity: 120.00 cm/s  TR Vmean:       300.0 cm/s  MV A velocity: 128.00 cm/s  MV  E/A ratio:  0.94         SHUNTS                              Systemic VTI:  0.26 m                              Systemic Diam: 2.10 cm    CATH(11/2022) Conclusion   1.  Patent left main, LAD, and left circumflex, with calcification and mild nonobstructive plaquing stable from the previous cardiac catheterization study 2.  Chronic occlusion of the RCA with patent RIMA to RCA graft 3.  Known severe prosthetic aortic stenosis, aortic valve not crossed 4.  Mild pulmonary hypertension with PA pressure 41/24, mean 32 mmHg.  Transpulmonary gradient is 11 mmHg with a PVR of 2.5 Wood units.  Recent Radiology Findings:        Recent Lab Findings: CTA (11/2022) FINDINGS: Aortic valve: There is a 21mm Magna Ease valve in the aortic position. Either a 20mm or 23mm Sapien 3 valve would be options for valve in valve THV implantation. For 23mm Sapien 3, the virtual THV to coronary (VTC) distance measures 7mm to left main and 1mm to RCA. For 20mm Sapien 3, the VTC distance measures 8mm to left main and 2mm to RCA. This suggests elevated risk  of RCA obstruction with valve in valve procedure for both 23mm and 20mm Sapien 3 valve   Right atrium: Normal size   Right ventricle: Mild dilatation   Pulmonary arteries: Normal size   Pulmonary veins: Normal configuration   Left atrium: Mild enlargement   Left ventricle: Normal size   Pericardium: Normal thickness   Coronary arteries: Normal origins. Calcium score 2516 (98th percentile). Patent RIMA-RCA   IMPRESSION: 1. There is a 21mm Magna Ease valve in the aortic position. Either a 20mm or 23mm Sapien 3 valve would be options for valve in valve THV implantation. For 23mm Sapien 3, the virtual THV to coronary (VTC) distance measures 7mm to left main and 1mm to RCA. For 20mm Sapien 3, the VTC distance measures 8mm to left main and 2mm to RCA. This suggests elevated risk of RCA obstruction with valve in valve procedure for both 23mm and 20mm Sapien 3 valve   2.  Coronary calcium score 2516 (98th percentile).  Patent RIMA-RCA Recent Labs       Lab Results  Component Value Date    WBC 6.6 11/19/2022    HGB 10.2 (L) 11/26/2022    HGB 10.2 (L) 11/26/2022    HCT 30.0 (L) 11/26/2022    HCT 30.0 (L) 11/26/2022    PLT 223 11/19/2022    GLUCOSE 130 (H) 11/19/2022    CHOL 149 10/07/2021    TRIG 185 (H) 10/07/2021    HDL 50 10/07/2021    LDLDIRECT 76.0 06/24/2020    LDLCALC 68 10/07/2021    ALT 17 10/07/2021    AST 19 10/07/2021    NA 142 11/26/2022    NA 142 11/26/2022    K 3.8 11/26/2022    K 3.8 11/26/2022    CL 100 11/19/2022    CREATININE 0.96 11/19/2022    BUN 20 11/19/2022    CO2 24 11/19/2022    TSH 3.03 07/06/2022    INR 3.1 06/18/2010    HGBA1C 6.8 (H) 07/06/2022            Assessment /  Plan:     77 yo female with NYHA class 2-3 symptoms of severe prosthetic aortic valve stenosis with patent RIMA to RCA and highly calcified ascending aorta. Pt has a class I indication to proceed with redo AVR but due to her being not a surgical candidate, would best  be served with valve in valve TAVR with a 23mm Sapien via the femoral access with Sentinal protection device. All the risks and goals of surgery along with recovery discussed. Pt quite concerned over her ability to leave after one day due to her concerns on what she will need to do at home after with little help from family she has noted even following her cath.

## 2023-01-04 NOTE — Progress Notes (Signed)
 Patient signed all consents at PAT lab appointment. CHG soap and instructions were given to patient. CHG surgical prep reviewed with patient and all questions answered.  Patients chart send to anesthesia for review. Pt denies any respiratory illness/infection in the last two months.

## 2023-01-05 ENCOUNTER — Encounter (HOSPITAL_COMMUNITY): Payer: Self-pay | Admitting: Cardiovascular Disease

## 2023-01-05 ENCOUNTER — Inpatient Hospital Stay (HOSPITAL_COMMUNITY)
Admission: RE | Admit: 2023-01-05 | Discharge: 2023-01-06 | DRG: 267 | Disposition: A | Discharge status: Home / Self Care | Payer: HMO | Attending: Cardiovascular Disease | Admitting: Cardiovascular Disease

## 2023-01-05 ENCOUNTER — Encounter (HOSPITAL_COMMUNITY)
Admission: RE | Disposition: A | Discharge status: Home / Self Care | Payer: Self-pay | Source: Home / Self Care | Attending: Cardiovascular Disease

## 2023-01-05 ENCOUNTER — Inpatient Hospital Stay (HOSPITAL_COMMUNITY): Payer: HMO

## 2023-01-05 ENCOUNTER — Inpatient Hospital Stay (HOSPITAL_COMMUNITY): Payer: Self-pay | Admitting: Certified Registered Nurse Anesthetist

## 2023-01-05 ENCOUNTER — Other Ambulatory Visit: Payer: Self-pay | Admitting: Physician Assistant

## 2023-01-05 ENCOUNTER — Ambulatory Visit: Payer: HMO | Admitting: Internal Medicine

## 2023-01-05 ENCOUNTER — Inpatient Hospital Stay (HOSPITAL_COMMUNITY): Payer: HMO | Admitting: Certified Registered Nurse Anesthetist

## 2023-01-05 ENCOUNTER — Other Ambulatory Visit: Payer: Self-pay

## 2023-01-05 DIAGNOSIS — K219 Gastro-esophageal reflux disease without esophagitis: Secondary | ICD-10-CM | POA: Diagnosis not present

## 2023-01-05 DIAGNOSIS — I2510 Atherosclerotic heart disease of native coronary artery without angina pectoris: Secondary | ICD-10-CM | POA: Diagnosis present

## 2023-01-05 DIAGNOSIS — T82857A Stenosis of cardiac prosthetic devices, implants and grafts, initial encounter: Principal | ICD-10-CM | POA: Diagnosis present

## 2023-01-05 DIAGNOSIS — I350 Nonrheumatic aortic (valve) stenosis: Secondary | ICD-10-CM

## 2023-01-05 DIAGNOSIS — Z833 Family history of diabetes mellitus: Secondary | ICD-10-CM | POA: Diagnosis not present

## 2023-01-05 DIAGNOSIS — I708 Atherosclerosis of other arteries: Secondary | ICD-10-CM | POA: Diagnosis present

## 2023-01-05 DIAGNOSIS — I099 Rheumatic heart disease, unspecified: Secondary | ICD-10-CM | POA: Diagnosis present

## 2023-01-05 DIAGNOSIS — Z880 Allergy status to penicillin: Secondary | ICD-10-CM | POA: Diagnosis not present

## 2023-01-05 DIAGNOSIS — Z85828 Personal history of other malignant neoplasm of skin: Secondary | ICD-10-CM | POA: Diagnosis not present

## 2023-01-05 DIAGNOSIS — I771 Stricture of artery: Secondary | ICD-10-CM | POA: Diagnosis present

## 2023-01-05 DIAGNOSIS — N1831 Chronic kidney disease, stage 3a: Secondary | ICD-10-CM | POA: Diagnosis not present

## 2023-01-05 DIAGNOSIS — I10 Essential (primary) hypertension: Secondary | ICD-10-CM | POA: Diagnosis present

## 2023-01-05 DIAGNOSIS — Z951 Presence of aortocoronary bypass graft: Secondary | ICD-10-CM | POA: Diagnosis not present

## 2023-01-05 DIAGNOSIS — Z7902 Long term (current) use of antithrombotics/antiplatelets: Secondary | ICD-10-CM | POA: Diagnosis not present

## 2023-01-05 DIAGNOSIS — Z950 Presence of cardiac pacemaker: Secondary | ICD-10-CM | POA: Diagnosis not present

## 2023-01-05 DIAGNOSIS — E1122 Type 2 diabetes mellitus with diabetic chronic kidney disease: Secondary | ICD-10-CM | POA: Diagnosis present

## 2023-01-05 DIAGNOSIS — Z7982 Long term (current) use of aspirin: Secondary | ICD-10-CM

## 2023-01-05 DIAGNOSIS — Z8249 Family history of ischemic heart disease and other diseases of the circulatory system: Secondary | ICD-10-CM

## 2023-01-05 DIAGNOSIS — I060 Rheumatic aortic stenosis: Secondary | ICD-10-CM | POA: Diagnosis present

## 2023-01-05 DIAGNOSIS — Z888 Allergy status to other drugs, medicaments and biological substances status: Secondary | ICD-10-CM

## 2023-01-05 DIAGNOSIS — I5032 Chronic diastolic (congestive) heart failure: Secondary | ICD-10-CM | POA: Diagnosis not present

## 2023-01-05 DIAGNOSIS — Z882 Allergy status to sulfonamides status: Secondary | ICD-10-CM | POA: Diagnosis not present

## 2023-01-05 DIAGNOSIS — I252 Old myocardial infarction: Secondary | ICD-10-CM | POA: Diagnosis not present

## 2023-01-05 DIAGNOSIS — Z952 Presence of prosthetic heart valve: Principal | ICD-10-CM

## 2023-01-05 DIAGNOSIS — Y831 Surgical operation with implant of artificial internal device as the cause of abnormal reaction of the patient, or of later complication, without mention of misadventure at the time of the procedure: Secondary | ICD-10-CM | POA: Diagnosis present

## 2023-01-05 DIAGNOSIS — Z006 Encounter for examination for normal comparison and control in clinical research program: Secondary | ICD-10-CM | POA: Diagnosis not present

## 2023-01-05 DIAGNOSIS — Z79899 Other long term (current) drug therapy: Secondary | ICD-10-CM

## 2023-01-05 DIAGNOSIS — E785 Hyperlipidemia, unspecified: Secondary | ICD-10-CM | POA: Diagnosis not present

## 2023-01-05 DIAGNOSIS — I38 Endocarditis, valve unspecified: Secondary | ICD-10-CM | POA: Diagnosis present

## 2023-01-05 DIAGNOSIS — Z87891 Personal history of nicotine dependence: Secondary | ICD-10-CM | POA: Diagnosis not present

## 2023-01-05 DIAGNOSIS — I5030 Unspecified diastolic (congestive) heart failure: Secondary | ICD-10-CM | POA: Diagnosis not present

## 2023-01-05 DIAGNOSIS — Z91048 Other nonmedicinal substance allergy status: Secondary | ICD-10-CM

## 2023-01-05 DIAGNOSIS — I130 Hypertensive heart and chronic kidney disease with heart failure and stage 1 through stage 4 chronic kidney disease, or unspecified chronic kidney disease: Secondary | ICD-10-CM | POA: Diagnosis present

## 2023-01-05 DIAGNOSIS — I110 Hypertensive heart disease with heart failure: Secondary | ICD-10-CM | POA: Diagnosis not present

## 2023-01-05 DIAGNOSIS — I779 Disorder of arteries and arterioles, unspecified: Secondary | ICD-10-CM | POA: Diagnosis present

## 2023-01-05 DIAGNOSIS — E118 Type 2 diabetes mellitus with unspecified complications: Secondary | ICD-10-CM | POA: Diagnosis present

## 2023-01-05 DIAGNOSIS — I351 Nonrheumatic aortic (valve) insufficiency: Secondary | ICD-10-CM | POA: Diagnosis not present

## 2023-01-05 DIAGNOSIS — I25119 Atherosclerotic heart disease of native coronary artery with unspecified angina pectoris: Secondary | ICD-10-CM | POA: Diagnosis present

## 2023-01-05 HISTORY — DX: Breakdown (mechanical) of heart valve prosthesis, initial encounter: T82.01XA

## 2023-01-05 HISTORY — DX: Presence of prosthetic heart valve: Z95.2

## 2023-01-05 HISTORY — PX: INTRAOPERATIVE TRANSTHORACIC ECHOCARDIOGRAM: SHX6523

## 2023-01-05 LAB — ECHOCARDIOGRAM LIMITED
AR max vel: 3.98 cm2
AV Area VTI: 3.7 cm2
AV Area mean vel: 3.98 cm2
AV Mean grad: 5 mm[Hg]
AV Peak grad: 8.6 mm[Hg]
Ao pk vel: 1.47 m/s
Calc EF: 63.9 %
S' Lateral: 3.7 cm
Single Plane A2C EF: 63.6 %
Single Plane A4C EF: 63.8 %

## 2023-01-05 LAB — POCT I-STAT, CHEM 8
BUN: 17 mg/dL (ref 8–23)
Calcium, Ion: 1.15 mmol/L (ref 1.15–1.40)
Chloride: 103 mmol/L (ref 98–111)
Creatinine, Ser: 0.7 mg/dL (ref 0.44–1.00)
Glucose, Bld: 183 mg/dL — ABNORMAL HIGH (ref 70–99)
HCT: 28 % — ABNORMAL LOW (ref 36.0–46.0)
Hemoglobin: 9.5 g/dL — ABNORMAL LOW (ref 12.0–15.0)
Potassium: 4.2 mmol/L (ref 3.5–5.1)
Sodium: 138 mmol/L (ref 135–145)
TCO2: 23 mmol/L (ref 22–32)

## 2023-01-05 LAB — POCT ACTIVATED CLOTTING TIME
Activated Clotting Time: 314 s
Activated Clotting Time: 124 s

## 2023-01-05 SURGERY — TRANSCATHETER AORTIC VALVE REPLACEMENT, TRANSFEMORAL (CATHLAB)
Anesthesia: Monitor Anesthesia Care

## 2023-01-05 MED ORDER — HEPARIN (PORCINE) IN NACL 1000-0.9 UT/500ML-% IV SOLN
INTRAVENOUS | Status: DC | PRN
  Administered 2023-01-05 (×3): 500 mL

## 2023-01-05 MED ORDER — MONTELUKAST SODIUM 10 MG PO TABS
10.0000 mg | ORAL_TABLET | Freq: Every day | ORAL | Status: DC
  Administered 2023-01-05: 10 mg via ORAL
  Filled 2023-01-05: qty 1

## 2023-01-05 MED ORDER — PANTOPRAZOLE SODIUM 40 MG PO TBEC
40.0000 mg | DELAYED_RELEASE_TABLET | Freq: Every day | ORAL | Status: DC
  Administered 2023-01-06: 40 mg via ORAL
  Filled 2023-01-05: qty 1

## 2023-01-05 MED ORDER — TRAMADOL HCL 50 MG PO TABS
50.0000 mg | ORAL_TABLET | ORAL | Status: DC | PRN
  Administered 2023-01-05: 50 mg via ORAL
  Filled 2023-01-05: qty 1

## 2023-01-05 MED ORDER — SODIUM CHLORIDE 0.9 % IV SOLN: 250.0000 mL | INTRAVENOUS | Status: DC | PRN

## 2023-01-05 MED ORDER — ISOSORBIDE MONONITRATE ER 30 MG PO TB24
30.0000 mg | ORAL_TABLET | Freq: Every day | ORAL | Status: DC
  Administered 2023-01-06: 30 mg via ORAL
  Filled 2023-01-05: qty 1

## 2023-01-05 MED ORDER — MORPHINE SULFATE (PF) 2 MG/ML IV SOLN: 1.0000 mg | INTRAVENOUS | Status: DC | PRN

## 2023-01-05 MED ORDER — IODIXANOL 320 MG/ML IV SOLN
INTRAVENOUS | Status: DC | PRN
  Administered 2023-01-05: 22 mL

## 2023-01-05 MED ORDER — ACETAMINOPHEN 650 MG RE SUPP: 650.0000 mg | Freq: Four times a day (QID) | RECTAL | Status: DC | PRN

## 2023-01-05 MED ORDER — LORATADINE 10 MG PO TABS
10.0000 mg | ORAL_TABLET | Freq: Every day | ORAL | Status: DC
  Filled 2023-01-05: qty 1

## 2023-01-05 MED ORDER — SODIUM CHLORIDE 0.9 % IV SOLN
INTRAVENOUS | Status: AC
Start: 2023-01-05 — End: 2023-01-05

## 2023-01-05 MED ORDER — VERAPAMIL HCL 2.5 MG/ML IV SOLN
INTRAVENOUS | Status: DC | PRN
  Administered 2023-01-05: 10 mL via INTRA_ARTERIAL

## 2023-01-05 MED ORDER — ACETAMINOPHEN 325 MG PO TABS: 650.0000 mg | ORAL_TABLET | Freq: Four times a day (QID) | ORAL | Status: DC | PRN

## 2023-01-05 MED ORDER — EZETIMIBE 10 MG PO TABS
10.0000 mg | ORAL_TABLET | Freq: Every day | ORAL | Status: DC
  Administered 2023-01-06: 10 mg via ORAL
  Filled 2023-01-05: qty 1

## 2023-01-05 MED ORDER — LORATADINE 10 MG PO TABS: 10.0000 mg | ORAL_TABLET | Freq: Every day | ORAL | Status: DC | PRN

## 2023-01-05 MED ORDER — SODIUM CHLORIDE 0.9 % IV SOLN: INTRAVENOUS | Status: DC

## 2023-01-05 MED ORDER — PROTAMINE SULFATE 10 MG/ML IV SOLN
INTRAVENOUS | Status: DC | PRN
  Administered 2023-01-05: 110 mg via INTRAVENOUS

## 2023-01-05 MED ORDER — ONDANSETRON HCL 4 MG/2ML IJ SOLN
INTRAMUSCULAR | Status: DC | PRN
  Administered 2023-01-05: 4 mg via INTRAVENOUS

## 2023-01-05 MED ORDER — PROPOFOL 10 MG/ML IV BOLUS
INTRAVENOUS | Status: DC | PRN
  Administered 2023-01-05 (×3): 10 mg via INTRAVENOUS
  Administered 2023-01-05: 15 mg via INTRAVENOUS
  Administered 2023-01-05: 10 ug/kg/min via INTRAVENOUS
  Administered 2023-01-05 (×2): 10 mg via INTRAVENOUS

## 2023-01-05 MED ORDER — HEPARIN SODIUM (PORCINE) 1000 UNIT/ML IJ SOLN
INTRAMUSCULAR | Status: DC | PRN
Start: 2023-01-05 — End: 2023-01-05
  Administered 2023-01-05: 11000 [IU] via INTRAVENOUS

## 2023-01-05 MED ORDER — ONDANSETRON HCL 4 MG/2ML IJ SOLN: 4.0000 mg | Freq: Four times a day (QID) | INTRAMUSCULAR | Status: DC | PRN

## 2023-01-05 MED ORDER — CLEVIDIPINE BUTYRATE 0.5 MG/ML IV EMUL
INTRAVENOUS | Status: DC | PRN
  Administered 2023-01-05: 2 mg/h via INTRAVENOUS

## 2023-01-05 MED ORDER — LACTATED RINGERS IV SOLN: INTRAVENOUS | Status: DC | PRN

## 2023-01-05 MED ORDER — CHLORHEXIDINE GLUCONATE 4 % EX SOLN
60.0000 mL | Freq: Once | CUTANEOUS | Status: DC
Start: 2023-01-06 — End: 2023-01-05

## 2023-01-05 MED ORDER — OXYCODONE HCL 5 MG PO TABS: 5.0000 mg | ORAL_TABLET | ORAL | Status: DC | PRN

## 2023-01-05 MED ORDER — SIMVASTATIN 20 MG PO TABS
40.0000 mg | ORAL_TABLET | Freq: Every day | ORAL | Status: DC
  Administered 2023-01-06: 40 mg via ORAL
  Filled 2023-01-05: qty 2

## 2023-01-05 MED ORDER — SODIUM CHLORIDE 0.9% FLUSH
3.0000 mL | Freq: Two times a day (BID) | INTRAVENOUS | Status: DC
  Administered 2023-01-05 – 2023-01-06 (×2): 3 mL via INTRAVENOUS

## 2023-01-05 MED ORDER — SODIUM CHLORIDE 0.9% FLUSH: 3.0000 mL | INTRAVENOUS | Status: DC | PRN

## 2023-01-05 MED ORDER — CLEVIDIPINE BUTYRATE 0.5 MG/ML IV EMUL
INTRAVENOUS | Status: AC
Start: 2023-01-05 — End: ?
  Filled 2023-01-05: qty 50

## 2023-01-05 MED ORDER — FENTANYL CITRATE (PF) 100 MCG/2ML IJ SOLN
INTRAMUSCULAR | Status: AC
  Filled 2023-01-05: qty 2

## 2023-01-05 MED ORDER — FENTANYL CITRATE (PF) 250 MCG/5ML IJ SOLN: INTRAMUSCULAR | Status: DC | PRN

## 2023-01-05 MED ORDER — ASPIRIN 81 MG PO TBEC: 81.0000 mg | DELAYED_RELEASE_TABLET | Freq: Every day | ORAL | Status: DC

## 2023-01-05 MED ORDER — NITROGLYCERIN IN D5W 200-5 MCG/ML-% IV SOLN
0.0000 ug/min | INTRAVENOUS | Status: DC
  Filled 2023-01-05: qty 250

## 2023-01-05 MED ORDER — CLOPIDOGREL BISULFATE 75 MG PO TABS
75.0000 mg | ORAL_TABLET | Freq: Every day | ORAL | Status: DC
Start: 2023-01-06 — End: 2023-01-06
  Administered 2023-01-06: 75 mg via ORAL
  Filled 2023-01-05: qty 1

## 2023-01-05 MED ORDER — CEFAZOLIN SODIUM-DEXTROSE 2-4 GM/100ML-% IV SOLN
2.0000 g | Freq: Three times a day (TID) | INTRAVENOUS | Status: AC
Start: 2023-01-05 — End: 2023-01-05
  Administered 2023-01-05 (×2): 2 g via INTRAVENOUS
  Filled 2023-01-05 (×2): qty 100

## 2023-01-05 MED ORDER — FENTANYL CITRATE (PF) 100 MCG/2ML IJ SOLN
INTRAMUSCULAR | Status: DC | PRN
  Administered 2023-01-05: 25 ug via INTRAVENOUS

## 2023-01-05 MED ORDER — LACTATED RINGERS IV SOLN
INTRAVENOUS | Status: DC
Start: 2023-01-05 — End: 2023-01-05

## 2023-01-05 MED ORDER — LIDOCAINE HCL (PF) 1 % IJ SOLN
INTRAMUSCULAR | Status: DC | PRN
  Administered 2023-01-05 (×2): 10 mL
  Administered 2023-01-05: 5 mL

## 2023-01-05 MED ORDER — AMLODIPINE BESYLATE 5 MG PO TABS
2.5000 mg | ORAL_TABLET | Freq: Every day | ORAL | Status: DC
  Administered 2023-01-05 – 2023-01-06 (×2): 2.5 mg via ORAL
  Filled 2023-01-05 (×2): qty 1

## 2023-01-05 MED ORDER — LIDOCAINE HCL (PF) 1 % IJ SOLN
INTRAMUSCULAR | Status: AC
Start: 2023-01-05 — End: ?
  Filled 2023-01-05: qty 30

## 2023-01-05 MED ORDER — CHLORHEXIDINE GLUCONATE 4 % EX SOLN: 30.0000 mL | CUTANEOUS | Status: DC

## 2023-01-05 MED ORDER — CHLORHEXIDINE GLUCONATE 0.12 % MT SOLN
15.0000 mL | Freq: Once | OROMUCOSAL | Status: AC
  Administered 2023-01-05 (×2): 15 mL via OROMUCOSAL
  Filled 2023-01-05 (×2): qty 15

## 2023-01-05 MED ORDER — CHLORHEXIDINE GLUCONATE 4 % EX SOLN: 60.0000 mL | Freq: Once | CUTANEOUS | Status: DC

## 2023-01-05 SURGICAL SUPPLY — 40 items
BAG SNAP BAND KOVER 36X36 (MISCELLANEOUS) ×2 IMPLANT
BALLN TRUE 22X4.5 (BALLOONS) ×1
BALLOON TRUE 22X4.5 (BALLOONS) IMPLANT
CABLE ADAPT PACING TEMP 12FT (ADAPTER) IMPLANT
CATH 23 ULTRA DELIVERY (CATHETERS) IMPLANT
CATH DIAG 6FR PIGTAIL ANGLED (CATHETERS) IMPLANT
CATH INFINITI 5FR ANG PIGTAIL (CATHETERS) IMPLANT
CATH INFINITI 6F AL1 (CATHETERS) IMPLANT
CATH LAUNCHER 5F EBU3.0 (CATHETERS) IMPLANT
CATH S G BIP PACING (CATHETERS) IMPLANT
CATHETER LAUNCHER 5F EBU3.0 (CATHETERS) ×1
CLOSURE MYNX CONTROL 6F/7F (Vascular Products) IMPLANT
CLOSURE PERCLOSE PROSTYLE (VASCULAR PRODUCTS) IMPLANT
CRIMPER (MISCELLANEOUS) IMPLANT
DEVICE INFLATION ATRION QL2530 (MISCELLANEOUS) IMPLANT
DEVICE RAD COMP TR BAND LRG (VASCULAR PRODUCTS) IMPLANT
ELECT DEFIB PAD ADLT CADENCE (PAD) IMPLANT
FILTER CEREBRAL PROT SENTINEL (FILTER) IMPLANT
GLIDESHEATH SLEND SS 6F .021 (SHEATH) IMPLANT
KIT ESSENTIALS PG (KITS) IMPLANT
KIT MICROPUNCTURE NIT STIFF (SHEATH) IMPLANT
KIT SAPIAN 3 ULTRA RESILIA 23 (Valve) IMPLANT
KIT SYRINGE INJ CVI SPIKEX1 (MISCELLANEOUS) IMPLANT
PACK CARDIAC CATHETERIZATION (CUSTOM PROCEDURE TRAY) ×1 IMPLANT
SET ATX-X65L (MISCELLANEOUS) IMPLANT
SHEATH BRITE TIP 7FR 35CM (SHEATH) IMPLANT
SHEATH INTRODUCER SET 20-26 (SHEATH) IMPLANT
SHEATH PINNACLE 6F 10CM (SHEATH) IMPLANT
SHEATH PINNACLE 8F 10CM (SHEATH) IMPLANT
SHEATH PROBE COVER 6X72 (BAG) IMPLANT
SHIELD CATH-GARD CONTAMINATION (MISCELLANEOUS) IMPLANT
SHIELD RADPAD SCOOP 12X17 (MISCELLANEOUS) IMPLANT
STOPCOCK MORSE 400PSI 3WAY (MISCELLANEOUS) ×2 IMPLANT
WIRE AMPLATZ SS-J .035X180CM (WIRE) IMPLANT
WIRE ASAHI PROWATER 180CM (WIRE) IMPLANT
WIRE CHOICE GRAPHX PT 300 (WIRE) IMPLANT
WIRE EMERALD 3MM-J .035X150CM (WIRE) IMPLANT
WIRE EMERALD 3MM-J .035X260CM (WIRE) IMPLANT
WIRE EMERALD ST .035X260CM (WIRE) IMPLANT
WIRE SAFARI SM CURVE 275 (WIRE) IMPLANT

## 2023-01-05 NOTE — Discharge Summary (Incomplete)
[facility]   MULTIDISCIPLINARY HEART VALVE TEAM  Discharge Summary    Patient ID: Heather Crawford MRN: [id number]; DOB: 08/04/1945  Admit date: 01/05/2023 Discharge date: 01/06/2023  Primary Care Provider: [name], MD  Primary Cardiologist: Tonny Bollman, MD   Discharge Diagnoses    Principal Problem:   S/P VIV TAVR (transcatheter aortic valve replacement) Active Problems:   Valvular heart disease s/p AVR in 2012   GERD   Essential hypertension   Coronary artery disease involving native coronary artery of native heart with angina pectoris (HCC)   Bilateral carotid artery disease (HCC)   Hyperlipidemia   Type II diabetes mellitus with manifestations (HCC)   Rheumatic heart disease   Stenosis of left subclavian artery (HCC)   (HFpEF) heart failure with preserved ejection fraction (HCC)   Stenosis of prosthetic aortic valve   Stage 3a chronic kidney disease (HCC)   Pacemaker   Allergies Allergies  Allergen Reactions   Crestor [Rosuvastatin] Other (See Comments)    myalgia   Lipitor [Atorvastatin] Other (See Comments)    myalgia   Sulfamethoxazole-Trimethoprim Hives   Tape Hives    Breakouts from the adhesive in paper tape and patches   Amoxicillin Itching    Did it involve swelling of the face/tongue/throat, SOB, or low BP? No Did it involve sudden or severe rash/hives, skin peeling, or any reaction on the inside of your mouth or nose? No Did you need to seek medical attention at a hospital or doctor's office? No When did it last happen?   per pt long time ago    If all above answers are "NO", may proceed with cephalosporin use.    Neomycin Itching    Topical neomycin Blister in ears   Neosporin [Neomycin-Bacitracin Zn-Polymyx] Other (See Comments)    Caused blistered   Polysporin [Bacitracin-Polymyxin B] Rash    Diagnostic Studies/Procedures    TAVR OPERATIVE NOTE     Date of Procedure:                01/05/2023   Preoperative  Diagnosis:Severe Prosthetic Aortic Valve Stenosis    Postoperative Diagnosis:    Same    Procedure:        Transcatheter Aortic Valve in Valve Replacement - Percutaneous Right Transfemoral Approach             Edwards Sapien 3 Ultra THV (size 23 mm, model # 9750RSL)        Sentinal cerebral protection device deployment              Co-Surgeons:                        Eugenio Hoes MD and Tonny Bollman, MD     Anesthesiologist:                  Hester Mates MD   Echocardiographer:              Dr Lenor Derrick   Pre-operative Echo Findings: Severe prosthetic valve aortic stenosis normal left ventricular systolic function   Post-operative Echo Findings: no paravalvular leak normal left ventricular systolic function   _____________    Echo 01/06/23: completed but pending formal read at the time of discharge   History of Present Illness     Heather Crawford is a 77 y.o. female with a history of HTN, CHB s/p PPM (2021), HLD, CAD/rheumatic heart disease s/p CABGx1 (RIMA-RCA) and AVR (21-mm Magna Ease  pericardial tissue valve) in 2012, HFpEF, LBBB, left subclavian stenosis and severe bioprosthetic aortic valve stenosis who presented to [facility] on 01/05/23 for planned VIV TAVR.   [name] has been followed for progressive prosthetic aortic valve stenosis over the past several years. Recently she has been suffering from increasing DOE. Echo 10/14/22 showed EF 55% and severe bioprosthetic aortic valve stenosis with a mean gradient of , mild AI, as well as severe MAC with mild MR. [facility] 11/26/22 showed patent left main, LAD, and left circumflex, with calcification and mild nonobstructive plaquing stable from the previous cardiac catheterization study as well as chronic occlusion of the RCA with patent RIMA to RCA graft. She underwent CTA 11/02/22 with femoral access however her ascending aorta was noted to be highly calcified. Also her RCA was noted to be low takeoff but cath showed a  patent RIMA to RCA.   The patient was evaluated by the multidisciplinary valve team and felt to have severe, symptomatic aortic stenosis and to be a suitable candidate for VIV TAVR, which was set up for 01/05/23.  Hospital Course     Consultants: none   Severe AS: s/p successful VIV TAVR with a 23 mm Edwards Sapien 3 Ultra Resilia THV via the TF approach on 01/05/23. Sentinel cerebral embolic protection was used given VIV TAVR. The surgical valve was fractured. Post operative echo completed but pending formal read. Groin/radial sites are stable. Continue Asprin 81mg  daily and add Plavix 75mg  daily x 3 months. Walked with cardiac rehab with no issues. Plan for discharge home today with close follow up in the outpatient setting.   GERD: will change omeprazole to pantoprazole while on Plavix given drug drug interaction.   HTN: BP has been elevated in the setting of holding home meds. Resume home antihypertensives and follow.  CHB s/p PPM: followed by Dr. Elberta Fortis.  CAD: [facility] 11/26/22 showed patent left main, LAD, and left circumflex, with calcification and mild nonobstructive plaquing stable from the previous cardiac catheterization study as well as chronic occlusion of the RCA with patent RIMA to RCA graft. Continue medical therapy with aspirin 81mg  daily and simvastatin 40mg  daily.   HFpEF: LVEDP 14 at the time of TAVR. She appears euvolemic. Resume home Lasix 20mg  daily.  Pulmonary nodules: pre TAVR CT showed "small solid pulmonary nodules in the right middle lobe along the minor fissure, largest 0.3 cm. No follow-up needed if patient is low-risk (and has no known or suspected primary neoplasm). Non-contrast chest CT can be considered in 12 months if patient is high-risk." This will be discussed in the outpatient setting.  _____________  Discharge Vitals Blood pressure (!) 165/57, pulse 94, temperature 98.3 F (36.8 C), temperature source Oral, resp. rate (!) 23, height 5\' 2"  (1.575 m), weight  76.5 kg, SpO2 93%.  Filed Weights   01/04/23 1200 01/05/23 0548 01/06/23 0530  Weight: 73.5 kg 73.5 kg 76.5 kg     GEN: Well nourished, well developed, in no acute distress HEENT: normal Neck: no JVD or masses Cardiac: RRR; 2/6 SEM flow murmur @ RUSB. No rubs, or gallops,no edema  Respiratory:  clear to auscultation bilaterally, normal work of breathing GI: soft, nontender, nondistended, + BS MS: no deformity or atrophy Skin: warm and dry, no rash.  Groin sites clear without hematoma or ecchymosis  Neuro:  Alert and Oriented x 3, Strength and sensation are intact Psych: euthymic mood, full affect    Disposition   Pt is being discharged home today in good condition.  Follow-up Plans & Appointments     Follow-up Information     Janetta Hora, PA-C. Go on 01/13/2023.   Specialties: Cardiology, Radiology Why: @ 1:55pm, please arrive at least 10 minutes early Contact information: [address] [place] [address] [phone]                  Discharge Medications   Allergies as of 01/06/2023       Reactions   Crestor [rosuvastatin] Other (See Comments)   myalgia   Lipitor [atorvastatin] Other (See Comments)   myalgia   Sulfamethoxazole-trimethoprim Hives   Tape Hives   Breakouts from the adhesive in paper tape and patches   Amoxicillin Itching   Did it involve swelling of the face/tongue/throat, SOB, or low BP? No Did it involve sudden or severe rash/hives, skin peeling, or any reaction on the inside of your mouth or nose? No Did you need to seek medical attention at a hospital or doctor's office? No When did it last happen?   per pt long time ago    If all above answers are "NO", may proceed with cephalosporin use.   Neomycin Itching   Topical neomycin Blister in ears   Neosporin [neomycin-bacitracin Zn-polymyx] Other (See Comments)   Caused blistered   Polysporin [bacitracin-polymyxin B] Rash        Medication List      STOP taking these medications    omeprazole 40 MG capsule Commonly known as: PRILOSEC       TAKE these medications    acetaminophen 325 MG tablet Commonly known as: TYLENOL Take 1-2 tablets (325-650 mg total) by mouth every 4 (four) hours as needed for mild pain.   amLODipine 2.5 MG tablet Commonly known as: NORVASC Take 1 tablet (2.5 mg total) by mouth daily.   aspirin EC 81 MG tablet Take 1 tablet (81 mg total) by mouth daily. Swallow whole.   Azelastine HCl 137 MCG/SPRAY Soln Place 2 sprays into both nostrils 2 (two) times daily. Use in each nostril as directed What changed:  when to take this reasons to take this   azithromycin 500 MG tablet Commonly known as: Zithromax Take 1 tablet (500 mg total) by mouth as directed. Take one tablet 1 hour before any dental work including cleanings.   cetirizine 10 MG tablet Commonly known as: ZYRTEC Take 10 mg by mouth every morning. Alternate every 4 weeks with loratadine   Cholecalciferol 50 MCG (2000 UT) Tabs Take 1 tablet (2,000 Units total) by mouth daily. What changed: how much to take   clopidogrel 75 MG tablet Commonly known as: PLAVIX Take 1 tablet (75 mg total) by mouth daily with breakfast. Start taking on: January 07, 2023   ezetimibe 10 MG tablet Commonly known as: ZETIA Take 1 tablet (10 mg total) by mouth daily.   fluorouracil 5 % cream Commonly known as: EFUDEX Apply topically 2 (two) times daily. START ON JANUARY 1st, 2025   fluticasone 50 MCG/ACT nasal spray Commonly known as: FLONASE USE 1 SPRAY IN EACH NOSTRIL EVERY DAY What changed: See the new instructions.   furosemide 20 MG tablet Commonly known as: LASIX Take 1 tablet (20 mg total) by mouth daily.   hydrocortisone 2.5 % cream Apply topically 2 (two) times daily.   irbesartan 150 MG tablet Commonly known as: AVAPRO Take 0.5 tablets (75 mg total) by mouth in the morning and at bedtime.   isosorbide mononitrate 30 MG 24 hr  tablet Commonly known  as: IMDUR Take 1 tablet (30 mg total) by mouth daily.   loratadine 10 MG tablet Commonly known as: CLARITIN Take 10 mg by mouth daily as needed for allergies. Alternate with Zyrtec every 4 weeks   meclizine 25 MG tablet Commonly known as: ANTIVERT Take 25 mg by mouth 3 (three) times daily as needed for dizziness. OTC   montelukast 10 MG tablet Commonly known as: SINGULAIR Take 1 tablet (10 mg total) by mouth at bedtime.   nitroGLYCERIN 0.4 MG SL tablet Commonly known as: NITROSTAT Place 1 tablet (0.4 mg total) under the tongue every 5 (five) minutes as needed for chest pain.   pantoprazole 40 MG tablet Commonly known as: Protonix Take 1 tablet (40 mg total) by mouth daily.   Refresh Tears 0.5 % Soln Generic drug: carboxymethylcellulose Place 1 drop into both eyes 3 (three) times daily as needed (Burning eyes).   simvastatin 40 MG tablet Commonly known as: ZOCOR Take 1 tablet (40 mg total) by mouth daily.         Outstanding Labs/Studies   none  Duration of Discharge Encounter   Greater than 30 minutes including physician time.  Byrd Hesselbach, PA-C 01/06/2023, 9:33 AM [phone]  Patient seen, examined. Available data reviewed. Agree with findings, assessment, and plan as outlined by Carlean Jews, PA-C.  The patient is independently interviewed and examined.  Her daughter is at the bedside.  She is alert, oriented, no distress.  Lungs are clear to auscultation bilaterally, heart is regular rate and rhythm with a soft ejection murmur at the right upper sternal border, abdomen is soft nontender, bilateral groin sites are clear, there is no lower extremity edema.  Echo images are personally reviewed and show vigorous LV systolic function with normal TAVR prosthetic valve function, mean gradient 15 mmHg, no paravalvular regurgitation.  Patient medically stable for discharge.  Discussed post-TAVR restrictions and follow-up plan.  Reviewed  medical regimen which includes DAPT with aspirin and clopidogrel for at least 3 months.  Otherwise, as outlined above.  Tonny Bollman, M.D. 01/06/2023 10:05 AM

## 2023-01-05 NOTE — Progress Notes (Signed)
Arrived from cath lab.   A&O x 3 Bilateral groins level 0 - bed rest continues until 1345 R radial TR band @ 9cc VS per protocol

## 2023-01-05 NOTE — Progress Notes (Signed)
Mobility Specialist Progress Note:   01/05/23 1609  Mobility  Activity Ambulated with assistance in hallway  Level of Assistance  (MinG)  Assistive Device None  Distance Ambulated (ft) 350 ft  Activity Response Tolerated well  Mobility Referral Yes  $Mobility charge 1 Mobility  Mobility Specialist Start Time (ACUTE ONLY) 1555  Mobility Specialist Stop Time (ACUTE ONLY) 1605  Mobility Specialist Time Calculation (min) (ACUTE ONLY) 10 min   Pt received in bed, agreeable to mobility. Groin incision sites stable. Pt requesting to use BR before ambulation. SB assist to stand and ambulate to BR. Void successful. Pt c/o slight fatigue towards EOS otherwise asx throughout. Ambulated on RA. VSS. Pt left in chair with call bell in reach and all needs met.   Leory Plowman  Mobility Specialist Please contact via [employer] office at [phone]

## 2023-01-05 NOTE — Op Note (Signed)
[facility]   MULTIDISCIPLINARY HEART VALVE TEAM   TAVR OPERATIVE NOTE   Date of Procedure:  01/05/2023  Preoperative Diagnosis: Severe Prosthetic Aortic Valve Stenosis   Postoperative Diagnosis: Same   Procedure:   Transcatheter Aortic Valve in Valve Replacement - Percutaneous Right Transfemoral Approach  Edwards Sapien 3 Ultra THV (size 23 mm, model # 9750RSL)        Sentinal cerebral protection device deployment   Co-Surgeons:  Eugenio Hoes MD and Tonny Bollman, MD    Anesthesiologist:  Hester Mates MD  Echocardiographer:  Dr Lenor Derrick  Pre-operative Echo Findings: Severe prosthetic valve aortic stenosis normal left ventricular systolic function  Post-operative Echo Findings: no paravalvular leak normal left ventricular systolic function   BRIEF CLINICAL NOTE AND INDICATIONS FOR SURGERY  77 yo female with NYHA class 2-3 symptoms of severe prosthetic aortic valve stenosis with patent RIMA to RCA and highly calcified ascending aorta. Pt has a class I indication to proceed with redo AVR but due to her being not a surgical candidate, would best be served with valve in valve TAVR with a 23mm Sapien via the femoral access with Sentinal protection device.     DETAILS OF THE OPERATIVE PROCEDURE  PREPARATION:    The patient was brought to the operating room on the above mentioned date and appropriate monitoring was established by the anesthesia team. The patient was placed in the supine position on the operating table.  Intravenous antibiotics were administered. The patient was monitored closely throughout the procedure under conscious sedation.    Baseline transthoracic echocardiogram was performed. The patient's abdomen and both groins were prepped and draped in a sterile manner. A time out procedure was performed.   PERIPHERAL ACCESS:    Using the modified Seldinger technique, femoral arterial and venous access was obtained with placement of 6  Fr sheaths on the left side.  A guide catheter was passed through the left arterial sheath under fluoroscopic guidance into the aortic root and a coronary wire was advanced down the coronary to serve as protection for potential coronary occlusion.  A temporary transvenous pacemaker catheter was passed through the left femoral venous sheath under fluoroscopic guidance into the right ventricle.  The pacemaker was tested to ensure stable lead placement and pacemaker capture. The right radial artery was accessed via seldinger technique and following full anticoagulation the sentinal cerebral protection device was deployed without issues.   TRANSFEMORAL ACCESS:   Percutaneous transfemoral access and sheath placement was performed using ultrasound guidance.  The right common femoral artery was cannulated using a micropuncture needle and appropriate location was verified using hand injection angiogram.  A pair of Abbott Perclose percutaneous closure devices were placed and a 6 French sheath replaced into the femoral artery.  The patient was heparinized systemically and ACT verified > 250 seconds.    A 14 Fr transfemoral E-sheath was introduced into the right common femoral artery after progressively dilating over an Amplatz superstiff wire. An AL2 catheter was used to direct a straight-tip exchange length wire across the native aortic valve into the left ventricle. This was exchanged out for a pigtail catheter and position was confirmed in the LV apex. Simultaneous LV and Ao pressures were recorded with a mean gradient of and LVEDP of .  The pigtail catheter was exchanged for a Safari wire in the LV apex.     TRANSCATHETER HEART VALVE DEPLOYMENT:   An Edwards Sapien 3 Ultra transcatheter heart valve (size 23 mm) was prepared and  crimped per manufacturer's guidelines, and the proper orientation of the valve is confirmed on the [employer] delivery system. The valve was advanced through the  introducer sheath using normal technique until in an appropriate position in the abdominal aorta beyond the sheath tip. The balloon was then retracted and using the fine-tuning wheel was centered on the valve. The valve was then advanced across the aortic arch using appropriate flexion of the catheter. The valve was carefully positioned across the prosthetic aortic valve annulus. The Commander catheter was retracted using normal technique. Once final position of the valve has been confirmed by angiographic assessment, the valve is deployed during rapid ventricular pacing to maintain systolic blood pressure < 50 mmHg and pulse pressure < 10 mmHg. The balloon inflation is held for >3 seconds after reaching full deployment volume. Once the balloon has fully deflated the balloon is retracted into the ascending aorta. With good resolution of hemodynamics, the 59F true balloon was then utilized following removal of the commander delivery system to be advanced across the valve in valve and under rapid ventricular pacing, the previous magna valve was fractured at approximately 15psi. The pacing was discontinued after balloon deflation and good recovery of the hemodynamics was then obtained. The valve function was assessed using echocardiography. There is felt to be no paravalvular leak and no central aortic insufficiency.  The patient's hemodynamic recovery following valve deployment is good.  The deployment balloon and guidewire are both removed.   PROCEDURE COMPLETION:   The sheath was removed and femoral artery closure performed. The closure was examined with right iliac angiography which showed good perfusion of the lower right leg.  Protamine was administered once femoral arterial repair was complete. The temporary pacemaker, pigtail catheter and femoral sheaths were removed with manual pressure used for venous hemostasis.  A Mynx femoral closure device was utilized following removal of the diagnostic sheath in the  left femoral artery. The sentinal device was recaptured and a small particle of what appeared to be previous valve fragment was identified.  The patient tolerated the procedure well and is transported to the cath lab recovery area in stable condition. There were no immediate intraoperative complications. All sponge instrument and needle counts are verified correct at completion of the operation.   No blood products were administered during the operation.  The patient received a total of 30 mL of intravenous contrast during the procedure.   Eugenio Hoes, MD 01/05/2023 9:50 AM

## 2023-01-05 NOTE — Discharge Instructions (Signed)

## 2023-01-05 NOTE — Transfer of Care (Signed)
Immediate Anesthesia Transfer of Care Note  Patient: Heather Crawford  Procedure(s) Performed: Transcatheter Aortic Valve in Valve Replacement, Transfemoral with Cerebral Embolic Protection INTRAOPERATIVE TRANSTHORACIC ECHOCARDIOGRAM  Patient Location: Cath Lab  Anesthesia Type:MAC  Level of Consciousness: awake and patient cooperative  Airway & Oxygen Therapy: Patient Spontanous Breathing and Patient connected to face mask oxygen  Post-op Assessment: Report given to RN and Post -op Vital signs reviewed and stable  Post vital signs: Reviewed and stable  Last Vitals:  Vitals Value Taken Time  BP 105/50 01/05/23 1000  Temp    Pulse 62 01/05/23 1002  Resp 12 01/05/23 1002  SpO2 93 % 01/05/23 1002  Vitals shown include unfiled device data.  Last Pain:  Vitals:   01/05/23 0614  TempSrc:   PainSc: 0-No pain         Complications: No notable events documented.

## 2023-01-05 NOTE — Anesthesia Postprocedure Evaluation (Signed)
Anesthesia Post Note  Patient: Heather Crawford  Procedure(s) Performed: Transcatheter Aortic Valve in Valve Replacement, Transfemoral with Cerebral Embolic Protection INTRAOPERATIVE TRANSTHORACIC ECHOCARDIOGRAM     Patient location during evaluation: PACU Anesthesia Type: MAC Level of consciousness: awake and alert Pain management: pain level controlled Vital Signs Assessment: post-procedure vital signs reviewed and stable Respiratory status: spontaneous breathing, nonlabored ventilation, respiratory function stable and patient connected to nasal cannula oxygen Cardiovascular status: stable and blood pressure returned to baseline Postop Assessment: no apparent nausea or vomiting Anesthetic complications: no   No notable events documented.  Last Vitals:  Vitals:   01/05/23 1118 01/05/23 1138  BP: (!) 105/54 (!) 93/49  Pulse: (!) 59 60  Resp: 14 12  Temp:    SpO2: 97% 97%    Last Pain:  Vitals:   01/05/23 1118  TempSrc:   PainSc: 0-No pain                 Earl Lites P Cortney Mckinney

## 2023-01-05 NOTE — Anesthesia Procedure Notes (Signed)
Procedure Name: MAC Date/Time: 01/05/2023 7:40 AM  Performed by: Little Ishikawa, CRNAPre-anesthesia Checklist: Patient identified, Emergency Drugs available, Suction available, Timeout performed and Patient being monitored Patient Re-evaluated:Patient Re-evaluated prior to induction Oxygen Delivery Method: Simple face mask Induction Type: IV induction Dental Injury: Teeth and Oropharynx as per pre-operative assessment

## 2023-01-05 NOTE — Interval H&P Note (Signed)
History and Physical Interval Note:  01/05/2023 6:39 AM  Heather Crawford  has presented today for surgery, with the diagnosis of Severe Bioprosthetic Aortic Stenosis.  The various methods of treatment have been discussed with the patient and family. After consideration of risks, benefits and other options for treatment, the patient has consented to  Procedure(s): Transcatheter Aortic Valve in Valve Replacement, Transfemoral with Cerebral Embolic Protection (N/A) INTRAOPERATIVE TRANSTHORACIC ECHOCARDIOGRAM (N/A) as a surgical intervention.  The patient's history has been reviewed, patient examined, no change in status, stable for surgery.  I have reviewed the patient's chart and labs.  Questions were answered to the patient's satisfaction.     Eugenio Hoes

## 2023-01-05 NOTE — Progress Notes (Signed)
  [facility]   MULTIDISCIPLINARY HEART VALVE TEAM  Patient doing well s/p TAVR. She is hemodynamically stable. Groin sites stable. ECG with paced rhythm. Transferred from cath lab holding to 4E. Early ambulation after bedrest completed and hopeful discharge over the next 24-48 hours.   Cline Crock PA-C  [facility]  Pager [phone]

## 2023-01-05 NOTE — Op Note (Signed)
HEART AND VASCULAR CENTER   MULTIDISCIPLINARY HEART VALVE TEAM   TAVR OPERATIVE NOTE   Date of Procedure:  01/05/2023  Preoperative Diagnosis: Severe Aortic Stenosis   Postoperative Diagnosis: Same   Procedure:   Transcatheter Aortic Valve Replacement - Percutaneous Transfemoral Approach  Edwards Sapien 3 Ultra Resilia THV (size 23 mm, serial # [id number]) Sentinel Cerebral Embolic Protection   Co-Surgeons:  Eugenio Hoes, MD and Tonny Bollman, MD  Anesthesiologist:  Hester Mates, MD  Echocardiographer:  Riley Lam, MD  Pre-operative Echo Findings: Severe prosthetic aortic stenosis Normal left ventricular systolic function  Post-operative Echo Findings: No paravalvular leak Normal/unchanged left ventricular systolic function  BRIEF CLINICAL NOTE AND INDICATIONS FOR SURGERY  77 year old woman who has developed severe bioprosthetic aortic valve stenosis associated with NYHA functional class III symptoms of exertional dyspnea and fatigue.  She has a history of surgical aortic valve replacement with a 21 mm bioprosthesis and single-vessel CABG with a RIMA to RCA graft that was shown to be patent on preoperative imaging.  After multidisciplinary review of her case, we elected to proceed with valve in valve TAVR using cerebral embolic protection and protection of the left coronary artery.  During the course of the patient's preoperative work up they have been evaluated comprehensively by a multidisciplinary team of specialists coordinated through the Multidisciplinary Heart Valve Clinic in the [facility].  They have been demonstrated to suffer from symptomatic severe aortic stenosis as noted above. The patient has been counseled extensively as to the relative risks and benefits of all options for the treatment of severe aortic stenosis including long term medical therapy, conventional surgery for aortic valve replacement, and transcatheter  aortic valve replacement.  The patient has been independently evaluated in formal cardiac surgical consultation by Dr [name], who deemed the patient appropriate for TAVR. Based upon review of all of the patient's preoperative diagnostic tests they are felt to be candidate for transcatheter aortic valve replacement using the transfemoral approach as an alternative to conventional surgery.    Following the decision to proceed with transcatheter aortic valve replacement, a discussion has been held regarding what types of management strategies would be attempted intraoperatively in the event of life-threatening complications, including whether or not the patient would be considered a candidate for the use of cardiopulmonary bypass and/or conversion to open sternotomy for attempted surgical intervention.  The patient has been advised of a variety of complications that might develop peculiar to this approach including but not limited to risks of death, stroke, paravalvular leak, aortic dissection or other major vascular complications, aortic annulus rupture, device embolization, cardiac rupture or perforation, acute myocardial infarction, arrhythmia, heart block or bradycardia requiring permanent pacemaker placement, congestive heart failure, respiratory failure, renal failure, pneumonia, infection, other late complications related to structural valve deterioration or migration, or other complications that might ultimately cause a temporary or permanent loss of functional independence or other long term morbidity.  The patient provides full informed consent for the procedure as described and all questions were answered preoperatively.  DETAILS OF THE OPERATIVE PROCEDURE  PREPARATION:   The patient is brought to the operating room on the above mentioned date and central monitoring was established by the anesthesia team including placement of a radial arterial line. The patient is placed in the supine position on  the operating table.  Intravenous antibiotics are administered. The patient is monitored closely throughout the procedure under conscious sedation.  Baseline transthoracic echocardiogram is performed. The patient's chest, abdomen, both  groins, and both lower extremities are prepared and draped in a sterile manner. A time out procedure is performed.   PERIPHERAL ACCESS:   Using ultrasound guidance, femoral arterial and venous access is obtained with placement of 6 Fr sheaths on the left side.  [place] images are digitally captured and stored in the patient's chart.   A temporary transvenous pacemaker catheter was passed through the femoral venous sheath under fluoroscopic guidance into the right ventricle.  The pacemaker was tested to ensure stable lead placement and pacemaker capture.  A 5 French EBU 3.0 cm guide catheter is inserted into the left coronary cusp and a coronary angiogram is performed in the LAO projection.  Once the patient was fully heparinized, a Prowater wire is advanced into the diagonal branch of the LAD for coronary protection.  TRANSFEMORAL ACCESS:  A micropuncture technique is used to access the right femoral artery under fluoroscopic and ultrasound guidance.  2 Perclose devices are deployed at 10' and 2' positions to 'PreClose' the femoral artery. An 8 French sheath is placed and then an Amplatz Superstiff wire is advanced through the sheath. This is changed out for a 14 French transfemoral E-Sheath after progressively dilating over the Superstiff wire.  An AL-1 catheter was used to direct a straight-tip exchange length wire across the native aortic valve into the left ventricle. This was exchanged out for a pigtail catheter and position was confirmed in the LV apex. Simultaneous LV and Ao pressures were recorded.  The pigtail catheter was exchanged for a Safari wire in the LV apex.    SENTINEL CEREBRAL EMBOLIC PROTECTION: Using direct ultrasound guidance, the right radial artery is  accessed and a 6 French slender sheath is inserted.  Once a therapeutic ACT greater than 250 seconds is achieved, medium support wire is advanced through the radial sheath into the aortic root.  The Sentinel device is prepped per protocol then advanced into the innominate artery where the innominate filter is deployed.  The catheter is retroflexed in a way that it is oriented towards the left common carotid artery and the wire is easily advanced up the common carotid where the left carotid filter is deployed.  The catheter is retracted so that it is along the greater curvature of the aortic arch.  Once the procedure is completed, the Sentinel device is recaptured and removed from the body without complication.  TRANSCATHETER HEART VALVE DEPLOYMENT:  An Edwards Sapien 3 Ultra Resilia transcatheter heart valve (size 23 mm) was prepared and crimped per manufacturer's guidelines, and the proper orientation of the valve is confirmed on the [employer] delivery system. The valve was advanced through the introducer sheath using normal technique until in an appropriate position in the abdominal aorta beyond the sheath tip. The balloon was then retracted and using the fine-tuning wheel was centered on the valve. The valve was then advanced across the aortic arch using appropriate flexion of the catheter. The valve was carefully positioned across the aortic valve annulus. The Commander catheter was retracted using normal technique. Once final position of the valve has been confirmed by angiographic assessment, the valve is deployed while temporarily holding ventilation and during rapid ventricular pacing to maintain systolic blood pressure < 50 mmHg and pulse pressure < 10 mmHg. The balloon inflation is held for >3 seconds after reaching full deployment volume. Once the balloon has fully deflated the balloon is retracted into the ascending aorta and valve function is assessed using echocardiography.  We had decided  to post  dilate the valve in order to fracture the surgical sewing ring.  A 22 mm Bard true balloon is used.  The balloon is advanced around the aortic arch into the TAVR valve.  Rapid pacing is performed and the balloon is dilated to 15 atm where the valve is fractured.  The patient tolerated that well and her hemodynamic recovery following valvuloplasty is good.  The deployment balloon and guidewire are both removed. Echo demostrated acceptable post-procedural gradients, stable mitral valve function, and no aortic insufficiency.  The mean transvalvular gradient is 5 mmHg after postdilatation.   PROCEDURE COMPLETION:  The sheath was removed and femoral artery closure is performed using the 2 previously deployed Perclose devices.  Protamine is administered once femoral arterial repair was complete. The site is clear with no evidence of bleeding or hematoma after the sutures are tightened. The temporary pacemaker and pigtail catheters are removed. Mynx closure is used for contralateral femoral arterial hemostasis for the 6 Fr sheath.  Completion angiography is performed and shows narrowing of the distal common femoral artery on the right with brisk flow down the SFA and profunda.  The patient tolerated the procedure well and is transported to the recovery area in stable condition. There were no immediate intraoperative complications. All sponge instrument and needle counts are verified correct at completion of the operation.   The patient received a total of 30 mL of intravenous contrast during the procedure.  EBL: minimal  LVEDP: 19 mmHg   Tonny Bollman, MD 01/05/2023 11:02 AM

## 2023-01-05 NOTE — Progress Notes (Signed)
  Echocardiogram 2D Echocardiogram has been performed.  Heather Crawford 01/05/2023, 9:34 AM

## 2023-01-06 ENCOUNTER — Other Ambulatory Visit (HOSPITAL_COMMUNITY): Payer: Self-pay

## 2023-01-06 ENCOUNTER — Inpatient Hospital Stay (HOSPITAL_COMMUNITY): Payer: HMO

## 2023-01-06 DIAGNOSIS — Z006 Encounter for examination for normal comparison and control in clinical research program: Secondary | ICD-10-CM | POA: Diagnosis not present

## 2023-01-06 DIAGNOSIS — Z952 Presence of prosthetic heart valve: Secondary | ICD-10-CM

## 2023-01-06 DIAGNOSIS — I130 Hypertensive heart and chronic kidney disease with heart failure and stage 1 through stage 4 chronic kidney disease, or unspecified chronic kidney disease: Secondary | ICD-10-CM | POA: Diagnosis not present

## 2023-01-06 DIAGNOSIS — T82857A Stenosis of cardiac prosthetic devices, implants and grafts, initial encounter: Secondary | ICD-10-CM | POA: Diagnosis not present

## 2023-01-06 DIAGNOSIS — I5032 Chronic diastolic (congestive) heart failure: Secondary | ICD-10-CM | POA: Diagnosis not present

## 2023-01-06 LAB — BASIC METABOLIC PANEL
Anion gap: 7 (ref 5–15)
BUN: 13 mg/dL (ref 8–23)
CO2: 26 mmol/L (ref 22–32)
Calcium: 8.4 mg/dL — ABNORMAL LOW (ref 8.9–10.3)
Chloride: 102 mmol/L (ref 98–111)
Creatinine, Ser: 0.91 mg/dL (ref 0.44–1.00)
GFR, Estimated: >60 mL/min (ref >60)
Glucose, Bld: 121 mg/dL — ABNORMAL HIGH (ref 70–99)
Potassium: 4.2 mmol/L (ref 3.5–5.1)
Sodium: 135 mmol/L (ref 135–145)

## 2023-01-06 LAB — CBC
HCT: 32.9 % — ABNORMAL LOW (ref 36.0–46.0)
Hemoglobin: 10.7 g/dL — ABNORMAL LOW (ref 12.0–15.0)
MCH: 29 pg (ref 26.0–34.0)
MCHC: 32.5 g/dL (ref 30.0–36.0)
MCV: 89.2 fL (ref 80.0–100.0)
Platelets: 160 10*3/uL (ref 150–400)
RBC: 3.69 MIL/uL — ABNORMAL LOW (ref 3.87–5.11)
RDW: 12.4 % (ref 11.5–15.5)
WBC: 8 10*3/uL (ref 4.0–10.5)
nRBC: 0 % (ref 0.0–0.2)

## 2023-01-06 LAB — ECHOCARDIOGRAM COMPLETE
AR max vel: 1.59 cm2
AV Area VTI: 1.62 cm2
AV Area mean vel: 1.98 cm2
AV Mean grad: 13.3 mm[Hg]
AV Peak grad: 24.7 mm[Hg]
Ao pk vel: 2.48 m/s
Area-P 1/2: 2.19 cm2
Height: 62 in
MV VTI: 2.26 cm2
S' Lateral: 2.9 cm
Weight: 2698.43 [oz_av]

## 2023-01-06 LAB — MAGNESIUM: Magnesium: 1.6 mg/dL — ABNORMAL LOW (ref 1.7–2.4)

## 2023-01-06 MED ORDER — CLOPIDOGREL BISULFATE 75 MG PO TABS
75.0000 mg | ORAL_TABLET | Freq: Every day | ORAL | 0 refills | Status: DC
  Filled 2023-01-06: qty 90, 90d supply, fill #0

## 2023-01-06 MED ORDER — FUROSEMIDE 20 MG PO TABS: 20.0000 mg | ORAL_TABLET | Freq: Every day | ORAL | Status: DC

## 2023-01-06 MED ORDER — PANTOPRAZOLE SODIUM 40 MG PO TBEC
40.0000 mg | DELAYED_RELEASE_TABLET | Freq: Every day | ORAL | 0 refills | Status: DC
  Filled 2023-01-06: qty 90, 90d supply, fill #0

## 2023-01-06 MED ORDER — IRBESARTAN 150 MG PO TABS: 75.0000 mg | ORAL_TABLET | Freq: Every day | ORAL | Status: DC

## 2023-01-06 NOTE — Progress Notes (Signed)
Echocardiogram 2D Echocardiogram has been performed.  Warren Lacy Liddy Deam RDCS 01/06/2023, 9:10 AM

## 2023-01-06 NOTE — Plan of Care (Signed)
  Problem: Education: Goal: Knowledge of General Education information will improve Description: Including pain rating scale, medication(s)/side effects and non-pharmacologic comfort measures Outcome: Progressing   Problem: Clinical Measurements: Goal: Ability to maintain clinical measurements within normal limits will improve Outcome: Progressing   Problem: Clinical Measurements: Goal: Ability to maintain clinical measurements within normal limits will improve Outcome: Progressing Goal: Will remain free from infection Outcome: Progressing Goal: Diagnostic test results will improve Outcome: Progressing Goal: Respiratory complications will improve Outcome: Progressing Goal: Cardiovascular complication will be avoided Outcome: Progressing   Problem: Activity: Goal: Risk for activity intolerance will decrease Outcome: Progressing   Problem: Elimination: Goal: Will not experience complications related to bowel motility Outcome: Progressing Goal: Will not experience complications related to urinary retention Outcome: Progressing   Problem: Pain Management: Goal: General experience of comfort will improve Outcome: Progressing

## 2023-01-06 NOTE — Progress Notes (Signed)
Discharge instructions reviewed with pt, husband and daughter.  Copy of instructions given to pt. [facility] pharmacy has filled scripts for pt and will be picked up on the way out for discharge.  Pt to be d/c'd via wheelchair with belongings, with family.            Escorted by hospital volunteer.   Annice Needy, RN SWOT

## 2023-01-06 NOTE — Plan of Care (Signed)
  Problem: Education: Goal: Knowledge of General Education information will improve Description: Including pain rating scale, medication(s)/side effects and non-pharmacologic comfort measures Outcome: Completed/Met   Problem: Clinical Measurements: Goal: Ability to maintain clinical measurements within normal limits will improve Outcome: Completed/Met Goal: Will remain free from infection Outcome: Completed/Met Goal: Diagnostic test results will improve Outcome: Completed/Met Goal: Respiratory complications will improve Outcome: Completed/Met Goal: Cardiovascular complication will be avoided Outcome: Completed/Met   Problem: Activity: Goal: Risk for activity intolerance will decrease Outcome: Completed/Met   Problem: Elimination: Goal: Will not experience complications related to bowel motility Outcome: Completed/Met Goal: Will not experience complications related to urinary retention Outcome: Completed/Met   Problem: Pain Management: Goal: General experience of comfort will improve Outcome: Completed/Met

## 2023-01-06 NOTE — Progress Notes (Signed)
Pt ambulated with daughter this am. Sts some SOB but not as bad. Discussed with pt and family restrictions, walking for exercise and CRPII. Pt receptive however unsure of CRPII due to distance. Will refer to [place] CRPII.  [id number] Ethelda Chick BS, ACSM-CEP 01/06/2023 10:11 AM

## 2023-01-06 NOTE — Progress Notes (Signed)
Pt s/p TAVR, stable for transition home today, no TOC needs noted, family to transport home    01/06/23 1110  TOC Brief Assessment  Insurance and Status Reviewed  Patient has primary care physician Yes  Home environment has been reviewed home with spouse  Prior level of function: independent  Prior/Current Home Services No current home services  Social Determinants of Health Reivew SDOH reviewed no interventions necessary  Readmission risk has been reviewed Yes  Transition of care needs no transition of care needs at this time

## 2023-01-07 ENCOUNTER — Telehealth: Payer: Self-pay | Admitting: Physician Assistant

## 2023-01-07 NOTE — Telephone Encounter (Signed)
  HEART AND VASCULAR CENTER   MULTIDISCIPLINARY HEART VALVE TEAM   Patient contacted regarding discharge from [facility] on 12/4  Patient understands to follow up with a structural heart APP on 12/11 at [address] [facility].  Patient understands discharge instructions? yes Patient understands medications and regimen? yes Patient understands to bring all medications to this visit? yes  Cline Crock PA-C  [facility]

## 2023-01-09 ENCOUNTER — Other Ambulatory Visit (HOSPITAL_COMMUNITY): Payer: Self-pay | Admitting: *Deleted

## 2023-01-09 DIAGNOSIS — Z952 Presence of prosthetic heart valve: Secondary | ICD-10-CM

## 2023-01-11 ENCOUNTER — Telehealth (HOSPITAL_COMMUNITY): Payer: Self-pay

## 2023-01-11 NOTE — Telephone Encounter (Signed)
Called and spoke with pt in regards to CR, pt stated she is not interested at this time due to distance and will work out at home.   Closed referral

## 2023-01-11 NOTE — Progress Notes (Unsigned)
HEART AND VASCULAR CENTER   MULTIDISCIPLINARY HEART VALVE CLINIC                                     Cardiology Office Note:    Date:  01/11/2023   ID:  Heather Crawford, DOB [birth year], MRN [id number]  PCP:  [name], MD  [facility] Cardiologist:  Tonny Bollman, MD  [facility] Electrophysiologist:  Will Jorja Loa, MD   Referring MD: [name], MD   [facility] s/p TAVR  History of Present Illness:    [name] is a 77 y.o. 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  Ms. Hands has been followed for progressive prosthetic aortic valve stenosis over the past several years. Recently she has been suffering from increasing DOE. Echo 10/14/22 showed EF 55% and severe bioprosthetic aortic valve stenosis with a mean gradient of , mild AI, as well as severe MAC with mild MR. [facility] 11/26/22 showed patent left main, LAD, and left circumflex, with calcification and mild nonobstructive plaquing stable from the previous cardiac catheterization study as well as chronic occlusion of the RCA with patent RIMA to RCA graft. She underwent CTA 11/02/22 with femoral access however her ascending aorta was noted to be highly calcified. Also her RCA was noted to be low takeoff but cath showed a patent RIMA to RCA.   S/p VIV TAVR with a 23 mm Edwards Sapien 3 Ultra Resilia THV via the TF approach on 01/05/23. Sentinel cerebral embolic protection was used given VIV TAVR. The surgical valve was fractured. Post operative echo showed EF 55%, mod concentric LVH, normally functioning TAVR with a mean gradient of 13.3 mmHg and no PVL as well as moderate MAC and mild MR. Discharged on aspirin and Plavix x 3 months followed by aspirin   alone. Omeprazole switched to pantoprazole while on Plavix.  Today the patient presents to clinic for follow up.    Past Medical History:  Diagnosis Date   Bilateral carotid artery disease (HCC) 08/15/2012   Carotid US 11/2021: Bilat ICA 1-39   CAD (coronary artery disease)    a. s/p CABG 4/12: RIMA-RCA   Carotid artery disease (HCC)    Carotid US 10/21: Bilateral ICA 1-39; left subclavian stenosis   CHF (congestive heart failure) (HCC)    chronic diastolic CHF   Chronic back pain    H/O BACK SURGERY   Coronary artery disease    adenosine myoview 2/12 w/EF 56%, ISCHEMIA W/SCAR IN THE MID TO APICAL ANTERIO WALL, SEPTAL WALL, AND APEX. LHC 3/12 W/EF 55%, 40% OSTIAL RCA, 60% MID RCA, 95% DISTAL RC A, INTERVENTION  ATTEMPTED BUT UNABLE TO ADEQUADATELY SEAT  CATHETER.   Diverticulosis    Esophageal reflux    Glucose intolerance (impaired glucose tolerance)    A1C 6.3 (4/12)   H/O hiatal hernia    H/O vertigo    with fluid  in ears   Hemorrhoids    History of rheumatic fever    Hx of hysterectomy    Hx: UTI (urinary tract infection)    Hyperlipidemia    LBBB (left bundle branch block)    PPD positive  in the past   Prosthetic valve failure    S/P VIV TAVR (transcatheter aortic valve replacement) 01/05/2023   s/p VIV TAVR with a 23 mm Edwards S3UR via the TF approach by Dr. Excell Seltzer and [name]. The surgical valve was fractured.   Squamous cell carcinoma of skin    nose   Stenosis of left subclavian artery (HCC)    Carotid US 10/22: Bilateral ICA 1-39; L subclavian stenosis   Valvular heart disease s/p AVR in 2012 02/07/2010   AoV Replacement with Bovine prosthesis ' 12 Echo 2/23: EF 60-65, no RWMA, GR 1 DD, normal RVSF, normal PASP, AVR without aortic stenosis or AI (mean gradient 22 mmHg)     Current Medications: No outpatient medications have been marked as taking for the 01/13/23 encounter (Appointment) with [facility].      ROS:   Please see the  history of present illness.    All other systems reviewed and are negative.  EKGs       Risk Assessment/Calculations:       {This patient may be at risk for Amyloid. She has one or more dx on the problem list or PMH from the following list - Abnormal EKG, CHF, Aortic Stenosis, Proteinuria, LVH, Carpal Tunnel Syndrome, Biceps Tendon Rupture, Syncope. See list below or review PMH.  Diagnoses From Problem List           Noted     (HFpEF) heart failure with preserved ejection fraction (HCC) 03/13/2021     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    Click HERE to open Cardiac Amyloid Screening SmartSet to order screening OR Click HERE to defer testing for 1 year or permanently :1}    Physical Exam:    VS:  There were no vitals taken for this visit.    Wt Readings from Last 3 Encounters:  01/06/23 168 lb 10.4 oz (76.5 kg)  12/17/22 162 lb (73.5 kg)  11/26/22 162 lb (73.5 kg)     GEN: Well nourished, well developed in no acute distress NECK: No JVD CARDIAC: ***RRR, no murmurs, rubs, gallops RESPIRATORY:  Clear to auscultation without rales, wheezing or rhonchi  ABDOMEN: Soft, non-tender, non-distended EXTREMITIES:  No edema; No deformity.  Groin sites clear without hematoma or ecchymosis. ****  ASSESSMENT:    1. S/P VIV TAVR (transcatheter aortic valve replacement)   2. Gastroesophageal reflux disease, unspecified whether esophagitis present   3. Essential hypertension   4. Pacemaker   5. Coronary artery disease involving native coronary artery of native heart with angina pectoris (HCC)   6. Chronic heart failure with preserved ejection fraction (HCC)   7. Pulmonary nodules     PLAN:    In order of problems listed above:  Severe bioprosthetic AS s/p TAVR: pt doing *** s/p TAVR. ECG with no HAVB. Groin sites healing well. SBE***. Continue ***. I will see back for 1 month echo and OV.  GERD: omeprazole changed to pantoprazole while on Plavix given drug drug  interaction.    HTN:   CHB s/p PPM: followed by Dr. Elberta Fortis.   CAD: [facility] 11/26/22 showed patent left main, LAD, and left circumflex, with calcification and mild nonobstructive plaquing stable from the previous cardiac catheterization study as well as chronic occlusion of the RCA with patent RIMA to RCA graft. Continue medical therapy with aspirin 81mg  daily and simvastatin 40mg  daily.    HFpEF: appears *** . Continue Lasix 20mg  daily.   Pulmonary nodules: pre TAVR CT showed "  small solid pulmonary nodules in the right middle lobe along the minor fissure, largest 0.3 cm. No follow-up needed if patient is low-risk (and has no known or suspected primary neoplasm). Non-contrast chest CT can be considered in 12 months if patient is high-risk." This will be discussed in the outpatient setting.    {The patient has an active order for outpatient cardiac rehabilitation.   Please indicate if the patient is ready to start. Do NOT delete this.  It will auto delete.  Refresh note, then sign.              Click here to document readiness and see contraindications.  :1}  Cardiac Rehabilitation Eligibility Assessment      {Are you ordering a CV Procedure (e.g. stress test, cath, DCCV, TEE, etc)?   Press F2        :[id number]}    I spent *** minutes caring for this patient today including face-to-face discussions, reviewing labs, reviewing records from [facility] and other outside facilities, documenting in the record, and arranging for follow up.     Medication Adjustments/Labs and Tests Ordered: Current medicines are reviewed at length with the patient today.  Concerns regarding medicines are outlined above.  No orders of the defined types were placed in this encounter.  No orders of the defined types were placed in this encounter.   There are no Patient Instructions on file for this visit.   Signed, Cline Crock, PA-C  01/11/2023 11:42 AM    [facility]

## 2023-01-13 ENCOUNTER — Ambulatory Visit: Payer: HMO | Attending: Cardiology | Admitting: Physician Assistant

## 2023-01-13 VITALS — BP 134/58 | HR 87 | Ht 62.0 in | Wt 164.0 lb

## 2023-01-13 DIAGNOSIS — I25119 Atherosclerotic heart disease of native coronary artery with unspecified angina pectoris: Secondary | ICD-10-CM | POA: Diagnosis not present

## 2023-01-13 DIAGNOSIS — Z950 Presence of cardiac pacemaker: Secondary | ICD-10-CM

## 2023-01-13 DIAGNOSIS — K219 Gastro-esophageal reflux disease without esophagitis: Secondary | ICD-10-CM

## 2023-01-13 DIAGNOSIS — Z952 Presence of prosthetic heart valve: Secondary | ICD-10-CM | POA: Diagnosis not present

## 2023-01-13 DIAGNOSIS — I5032 Chronic diastolic (congestive) heart failure: Secondary | ICD-10-CM | POA: Diagnosis not present

## 2023-01-13 DIAGNOSIS — R918 Other nonspecific abnormal finding of lung field: Secondary | ICD-10-CM | POA: Diagnosis not present

## 2023-01-13 DIAGNOSIS — I10 Essential (primary) hypertension: Secondary | ICD-10-CM

## 2023-01-13 NOTE — Patient Instructions (Signed)
Medication Instructions:   Your physician recommends that you continue on your current medications as directed. Please refer to the Current Medication list given to you today.  *If you need a refill on your cardiac medications before your next appointment, please call your pharmacy*    Testing/Procedures:  As scheduled   Follow-Up:  As scheduled

## 2023-01-14 ENCOUNTER — Ambulatory Visit (INDEPENDENT_AMBULATORY_CARE_PROVIDER_SITE_OTHER): Payer: Medicare HMO

## 2023-01-14 DIAGNOSIS — I442 Atrioventricular block, complete: Secondary | ICD-10-CM | POA: Diagnosis not present

## 2023-01-14 LAB — CUP PACEART REMOTE DEVICE CHECK
Battery Remaining Longevity: 146 mo
Battery Voltage: 3.02 V
Brady Statistic AP VP Percent: 0.06 %
Brady Statistic AP VS Percent: 16.64 %
Brady Statistic AS VP Percent: 0.03 %
Brady Statistic AS VS Percent: 83.27 %
Brady Statistic RA Percent Paced: 16.94 %
Brady Statistic RV Percent Paced: 0.09 %
Date Time Interrogation Session: 20241211200931
Implantable Lead Connection Status: 753985
Implantable Lead Connection Status: 753985
Implantable Lead Implant Date: 20211215
Implantable Lead Implant Date: 20211215
Implantable Lead Location: 753859
Implantable Lead Location: 753860
Implantable Lead Model: 5076
Implantable Lead Model: 5076
Implantable Pulse Generator Implant Date: 20211215
Lead Channel Impedance Value: 285 Ohm
Lead Channel Impedance Value: 361 Ohm
Lead Channel Impedance Value: 399 Ohm
Lead Channel Impedance Value: 456 Ohm
Lead Channel Pacing Threshold Amplitude: 0.5 V
Lead Channel Pacing Threshold Amplitude: 0.625 V
Lead Channel Pacing Threshold Pulse Width: 0.4 ms
Lead Channel Pacing Threshold Pulse Width: 0.4 ms
Lead Channel Sensing Intrinsic Amplitude: 0.875 mV
Lead Channel Sensing Intrinsic Amplitude: 0.875 mV
Lead Channel Sensing Intrinsic Amplitude: 17.25 mV
Lead Channel Sensing Intrinsic Amplitude: 17.25 mV
Lead Channel Setting Pacing Amplitude: 1.5 V
Lead Channel Setting Pacing Amplitude: 2 V
Lead Channel Setting Pacing Pulse Width: 0.4 ms
Lead Channel Setting Sensing Sensitivity: 1.2 mV
Zone Setting Status: 755011

## 2023-01-18 ENCOUNTER — Encounter (HOSPITAL_BASED_OUTPATIENT_CLINIC_OR_DEPARTMENT_OTHER): Payer: Self-pay

## 2023-01-18 ENCOUNTER — Other Ambulatory Visit (HOSPITAL_BASED_OUTPATIENT_CLINIC_OR_DEPARTMENT_OTHER): Payer: Self-pay

## 2023-01-18 ENCOUNTER — Ambulatory Visit (HOSPITAL_BASED_OUTPATIENT_CLINIC_OR_DEPARTMENT_OTHER)
Admission: EM | Admit: 2023-01-18 | Discharge: 2023-01-18 | Disposition: A | Discharge status: Home / Self Care | Payer: HMO | Attending: Internal Medicine | Admitting: Internal Medicine

## 2023-01-18 DIAGNOSIS — R1031 Right lower quadrant pain: Secondary | ICD-10-CM

## 2023-01-18 LAB — POCT URINALYSIS DIP (MANUAL ENTRY)
Bilirubin, UA: NEGATIVE
Blood, UA: NEGATIVE
Glucose, UA: NEGATIVE mg/dL
Ketones, POC UA: NEGATIVE mg/dL
Leukocytes, UA: NEGATIVE
Nitrite, UA: NEGATIVE
Protein Ur, POC: NEGATIVE mg/dL
Spec Grav, UA: 1.01 (ref 1.010–1.025)
Urobilinogen, UA: 0.2 U/dL
pH, UA: 6.5 (ref 5.0–8.0)

## 2023-01-18 MED ORDER — ACETAMINOPHEN 325 MG PO TABS
975.0000 mg | ORAL_TABLET | Freq: Once | ORAL | Status: AC
  Administered 2023-01-18: 975 mg via ORAL

## 2023-01-18 MED ORDER — DOCUSATE SODIUM 100 MG PO CAPS
100.0000 mg | ORAL_CAPSULE | Freq: Every day | ORAL | 0 refills | Status: DC
  Filled 2023-01-18: qty 30, 30d supply, fill #0

## 2023-01-18 NOTE — ED Triage Notes (Signed)
Patient recently had TAVR surgery on 01/05/2023.   Right wrist and bilat groin sites. Patient states onset of right lower abdominal pain Saturday. Feel as if she has to urinate frequently. Had follow up with cardiac surgeon approx 1 week ago. No symptoms at that time.

## 2023-01-18 NOTE — ED Provider Notes (Signed)
[facility]    CSN: [id number] Arrival date & time: 01/18/23  1351      History   Chief Complaint Chief Complaint  Patient presents with   Dysuria    HPI Heather Crawford is a 77 y.o. female.   Heather Crawford is a 77 y.o. female presenting for chief complaint of pain to the mid lower abdomen, dysuria, and urinary frequency that started 2 days ago. Pain to the mid lower abdomen is currently a 4/10 and described as sharp/throbbing and constant. Pain is worse with movement, nothing makes pain better. History of CHF, takes lasix daily, held lasix today to see if this helped with urinary frequency and she states frequency has been better today. Denies urinary urgency, gross hematuria, N/V/D, fevers, chills, flank pain, and use of SGLT-2 inhibitor. History of type 2 diabetes that she states is well controlled with diet currently. Drinks flavored water and lemonade, otherwise drinks mostly water. No recent antibiotic/steroid use. Reports normal bowel movements, last bowel movement was this morning and soft/without blood. History of appendectomy. She has not attempted use of any OTC medications to help with pain PTA.   Recently had TAVR procedure on 01/05/2023 where she had bilateral femoral access sites and right wrist site. She had follow-up with cardiologist regarding TAVR last week and was cleared.    Dysuria   Past Medical History:  Diagnosis Date   Bilateral carotid artery disease (HCC) 08/15/2012   Carotid US 11/2021: Bilat ICA 1-39   CAD (coronary artery disease)    a. s/p CABG 4/12: RIMA-RCA   Carotid artery disease (HCC)    Carotid US 10/21: Bilateral ICA 1-39; left subclavian stenosis   CHF (congestive heart failure) (HCC)    chronic diastolic CHF   Chronic back pain    H/O BACK SURGERY   Coronary artery disease    adenosine myoview 2/12 w/EF 56%, ISCHEMIA W/SCAR IN THE MID TO APICAL ANTERIO WALL, SEPTAL WALL, AND APEX. LHC 3/12 W/EF 55%, 40% OSTIAL RCA, 60%  MID RCA, 95% DISTAL RC A, INTERVENTION  ATTEMPTED BUT UNABLE TO ADEQUADATELY SEAT  CATHETER.   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 Excell Seltzer and [name]. The surgical valve was fractured.   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   UARS (upper airway resistance syndrome) 07/06/2022   Upper airway cough  syndrome 07/06/2022   Pacemaker 10/06/2021   Stage 3a chronic kidney disease (HCC) 07/03/2021   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   (HFpEF) heart failure with preserved ejection fraction (HCC) 03/13/2021   Stenosis of left subclavian artery (HCC) 11/18/2020   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 06/20/2019   Vertigo, benign paroxysmal, bilateral 04/04/2018   Rheumatic heart disease 05/22/2017   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   Hyperlipidemia 08/16/2013   Osteopenia 08/16/2013   Type II diabetes mellitus with manifestations (HCC) 08/16/2013   Bilateral carotid artery disease (HCC) 08/15/2012   Atypical lobular hyperplasia of  breast 10/08/2011   Coronary artery disease involving native coronary artery of native heart with angina pectoris (HCC) 04/18/2010   Essential hypertension 03/26/2010   Valvular heart disease s/p AVR in 2012 02/07/2010   GERD 02/07/2010    Past Surgical History:  Procedure Laterality Date   ABDOMINAL HYSTERECTOMY     APPENDECTOMY  1973   BACK SURGERY     Lumbar Lam and discectomy x 2   BREAST BIOPSY Right 10/2011   BREAST LUMPECTOMY Right    CARDIAC CATHETERIZATION  2012   CARDIAC VALVE REPLACEMENT  2012   Aortic   CORONARY ARTERY BYPASS GRAFT  2012   DILATION AND CURETTAGE OF UTERUS     ENDOMETRIAL ABLATION     INTRAOPERATIVE TRANSTHORACIC ECHOCARDIOGRAM N/A 01/05/2023   Procedure: INTRAOPERATIVE TRANSTHORACIC ECHOCARDIOGRAM;  Surgeon: Tonny Bollman, MD;  Location: [facility] INVASIVE CV LAB;  Service: Cardiovascular;  Laterality: N/A;   LAPAROSCOPIC LYSIS INTESTINAL ADHESIONS     LUMBAR DISC SURGERY     DR. Deneen Harts- [place]   PACEMAKER IMPLANT N/A 01/17/2020   Procedure: PACEMAKER IMPLANT;  Surgeon: Regan Lemming, MD;  Location: [facility] INVASIVE CV LAB;  Service: Cardiovascular;  Laterality: N/A;   RIGHT/LEFT HEART CATH AND CORONARY/GRAFT ANGIOGRAPHY N/A 04/07/2021   Procedure: RIGHT/LEFT HEART CATH AND CORONARY/GRAFT ANGIOGRAPHY;  Surgeon: Yvonne Kendall, MD;  Location: [facility] INVASIVE CV LAB;  Service: Cardiovascular;  Laterality: N/A;   RIGHT/LEFT HEART CATH AND CORONARY/GRAFT ANGIOGRAPHY N/A 11/26/2022   Procedure: RIGHT/LEFT HEART CATH AND CORONARY/GRAFT ANGIOGRAPHY;  Surgeon: Tonny Bollman, MD;  Location: [facility] INVASIVE CV LAB;  Service: Cardiovascular;  Laterality: N/A;   SKIN SURGERY     skin sarcomas removed   TONSILLECTOMY     1955   TONSILLECTOMY      OB History   No obstetric history on file.      Home Medications    Prior to Admission medications   Medication Sig Start Date End Date Taking? Authorizing Provider  docusate sodium (COLACE) 100 MG capsule Take 1  capsule (100 mg total) by mouth daily. 01/18/23  Yes Carlisle Beers, FNP  acetaminophen (TYLENOL) 325 MG tablet Take 1-2 tablets (325-650 mg total) by mouth every 4 (four) hours as needed for mild pain. 01/18/20   Graciella Freer, PA-C  amLODipine (NORVASC) 2.5 MG tablet Take 1 tablet (2.5 mg total) by mouth daily. 06/02/22   Tereso Newcomer T, PA-C  aspirin EC 81 MG tablet Take 1 tablet (81 mg total) by mouth daily. Swallow whole. 07/03/20   Tereso Newcomer T, PA-C  azelastine (ASTELIN) 0.1 % nasal spray Place 2 sprays into both nostrils 2 (two) times daily. Use in each nostril as directed 12/03/22   [name], MD  azithromycin (ZITHROMAX) 500 MG tablet Take 1 tablet (500 mg total) by mouth as directed. Take one tablet 1 hour before any dental  work including cleanings. 11/27/22   Janetta Hora, PA-C  carboxymethylcellulose (REFRESH TEARS) 0.5 % SOLN Place 1 drop into both eyes 3 (three) times daily as needed (Burning eyes).    [provider]  cetirizine (ZYRTEC) 10 MG tablet Take 10 mg by mouth every morning. Alternate every 4 weeks with loratadine    [provider]  Cholecalciferol 2000 units TABS Take 1 tablet (2,000 Units total) by mouth daily. 02/17/17   [name], MD  clopidogrel (PLAVIX) 75 MG tablet Take 1 tablet (75 mg total) by mouth daily with breakfast. 01/07/23   Janetta Hora, PA-C  ezetimibe (ZETIA) 10 MG tablet Take 1 tablet (10 mg total) by mouth daily. 06/02/22   Tereso Newcomer T, PA-C  fluorouracil (EFUDEX) 5 % cream Apply topically 2 (two) times daily. START ON JANUARY 1st, 2025 12/21/22   Terri Piedra, DO  fluticasone (FLONASE) 50 MCG/ACT nasal spray USE 1 SPRAY IN EACH NOSTRIL EVERY DAY Patient taking differently: Place 1 spray into both nostrils daily as needed for allergies or rhinitis. 11/10/21   [name], MD  furosemide (LASIX) 20 MG tablet Take 1 tablet (20 mg total) by mouth daily. 10/15/22 01/13/23  Tereso Newcomer T,  PA-C  hydrocortisone 2.5 % cream Apply topically 2 (two) times daily. 12/21/22   Terri Piedra, DO  irbesartan (AVAPRO) 150 MG tablet Take 0.5 tablets (75 mg total) by mouth in the morning and at bedtime. 06/02/22   Tereso Newcomer T, PA-C  isosorbide mononitrate (IMDUR) 30 MG 24 hr tablet Take 1 tablet (30 mg total) by mouth daily. 06/02/22   Tereso Newcomer T, PA-C  loratadine (CLARITIN) 10 MG tablet Take 10 mg by mouth daily as needed for allergies. Alternate with Zyrtec every 4 weeks    [provider]  meclizine (ANTIVERT) 25 MG tablet Take 25 mg by mouth 3 (three) times daily as needed for dizziness. OTC    [provider]  montelukast (SINGULAIR) 10 MG tablet Take 1 tablet (10 mg total) by mouth at bedtime. 12/03/22   [name], MD  nitroGLYCERIN (NITROSTAT) 0.4 MG SL tablet Place 1 tablet (0.4 mg total) under the tongue every 5 (five) minutes as needed for chest pain. 06/02/22 01/13/23  Tereso Newcomer T, PA-C  pantoprazole (PROTONIX) 40 MG tablet Take 1 tablet (40 mg total) by mouth daily. 01/06/23 01/06/24  Janetta Hora, PA-C  simvastatin (ZOCOR) 40 MG tablet Take 1 tablet (40 mg total) by mouth daily. 06/02/22   Beatrice Lecher, PA-C    Family History Family History  Problem Relation Age of Onset   Hypertension Mother    Breast cancer Mother    Hypothyroidism Mother    Diabetes type II Mother    Heart disease Mother    Heart attack Father        CABG @ 27   CAD Father    Breast cancer Sister        x 2   Colon cancer Maternal Aunt    Breast cancer Maternal Aunt    Colon cancer Paternal Uncle    Pancreatic cancer Other        PGA   Breast cancer Paternal Grandmother    Stomach cancer Neg Hx    Throat cancer Neg Hx    Esophageal cancer Neg Hx     Social History Social History   Tobacco Use   Smoking status: Former    Current packs/day: 0.00  Average packs/day: 1 pack/day for 40.0 years (40.0 ttl pk-yrs)    Types: Cigarettes    Start date:  02/03/1960    Quit date: 02/03/2000    Years since quitting: 22.9    Passive exposure: Never   Smokeless tobacco: Former    Types: Chew   Tobacco comments:    discussed LDCT   Vaping Use   Vaping status: Never Used  Substance Use Topics   Alcohol use: No    Alcohol/week: 0.0 standard drinks of alcohol   Drug use: No     Allergies   Crestor [rosuvastatin], Lipitor [atorvastatin], Sulfamethoxazole-trimethoprim, Tape, Amoxicillin, Neomycin, Neosporin [neomycin-bacitracin zn-polymyx], and Polysporin [bacitracin-polymyxin b]   Review of Systems Review of Systems  Genitourinary:  Positive for dysuria.  Per HPI   Physical Exam Triage Vital Signs ED Triage Vitals  Encounter Vitals Group     BP 01/18/23 1441 114/70     Systolic BP Percentile --      Diastolic BP Percentile --      Pulse Rate 01/18/23 1441 63     Resp 01/18/23 1441 20     Temp 01/18/23 1441 98.7 F (37.1 C)     Temp Source 01/18/23 1441 Oral     SpO2 01/18/23 1441 94 %     Weight 01/18/23 1443 163 lb (73.9 kg)     Height --      Head Circumference --      Peak Flow --      Pain Score 01/18/23 1442 4     Pain Loc --      Pain Education --      Exclude from Growth Chart --    No data found.  Updated Vital Signs BP 114/70 (BP Location: Right Arm)   Pulse 63   Temp 98.7 F (37.1 C) (Oral)   Resp 20   Wt 163 lb (73.9 kg)   SpO2 94%   BMI 29.81 kg/m   Visual Acuity Right Eye Distance:   Left Eye Distance:   Bilateral Distance:    Right Eye Near:   Left Eye Near:    Bilateral Near:     Physical Exam Vitals and nursing note reviewed.  Constitutional:      Appearance: She is not ill-appearing or toxic-appearing.  HENT:     Head: Normocephalic and atraumatic.     Right Ear: Hearing and external ear normal.     Left Ear: Hearing and external ear normal.     Nose: Nose normal.     Mouth/Throat:     Lips: Pink.     Mouth: Mucous membranes are moist. No injury.     Tongue: No lesions. Tongue  does not deviate from midline.     Palate: No mass and lesions.     Pharynx: Oropharynx is clear. Uvula midline. No pharyngeal swelling, oropharyngeal exudate, posterior oropharyngeal erythema or uvula swelling.     Tonsils: No tonsillar exudate or tonsillar abscesses.  Eyes:     General: Lids are normal. Vision grossly intact. Gaze aligned appropriately.     Extraocular Movements: Extraocular movements intact.     Conjunctiva/sclera: Conjunctivae normal.  Cardiovascular:     Rate and Rhythm: Normal rate and regular rhythm.     Heart sounds: Normal heart sounds, S1 normal and S2 normal.  Pulmonary:     Effort: Pulmonary effort is normal. No respiratory distress.     Breath sounds: Normal breath sounds and air entry.  Abdominal:     General:  Abdomen is flat. Bowel sounds are normal.     Palpations: Abdomen is soft.     Tenderness: There is abdominal tenderness in the suprapubic area. There is no right CVA tenderness, left CVA tenderness or guarding.       Comments: Femoral sites assessed, no palpable underlying soft tissue swelling/evidence of hematoma to bilateral femoral/inguinal areas as well as area of tenderness at the right mid abdomen.   Musculoskeletal:     Cervical back: Neck supple.  Skin:    General: Skin is warm and dry.     Capillary Refill: Capillary refill takes less than 2 seconds.     Findings: No rash.  Neurological:     General: No focal deficit present.     Mental Status: She is alert and oriented to person, place, and time. Mental status is at baseline.     Cranial Nerves: No dysarthria or facial asymmetry.  Psychiatric:        Mood and Affect: Mood normal.        Speech: Speech normal.        Behavior: Behavior normal.        Thought Content: Thought content normal.        Judgment: Judgment normal.      UC Treatments / Results  Labs (all labs ordered are listed, but only abnormal results are displayed) Labs Reviewed  POCT URINALYSIS DIP (MANUAL  ENTRY)    EKG   Radiology No results found.  Procedures Procedures (including critical care time)  Medications Ordered in UC Medications  acetaminophen (TYLENOL) tablet 975 mg (975 mg Oral Given 01/18/23 1518)    Initial Impression / Assessment and Plan / UC Course  I have reviewed the triage vital signs and the nursing notes.  Pertinent labs & imaging results that were available during my care of the patient were reviewed by me and considered in my medical decision making (see chart for details).   1. Right lower quadrant abdominal pain Unclear etiology of patient's abdominal discomfort.  Urinalysis is unremarkable for UTI. Low suspicion for constipation, though she may benefit from stool softener daily. She has had recent narcotics from TAVR procedure, though this was 3 weeks ago.  Low suspicion for complication related to TAVR.  Abdominal exam is without peritoneal signs, she is well-appearing, and vital signs are hemodynamically stable.  Tylenol 975mg  given for pain in clinic. She may repeat this every 6 hours as needed for pain at home. Advised to try heat 20 minutes on 20 minutes off as needed.  Colace stool softener sent to pharmacy to be taken once daily to prevent constipation. Recommend follow-up with PCP in the next 2-3 days for re-check and further evaluation.   Counseled patient on potential for adverse effects with medications prescribed/recommended today, strict ER and return-to-clinic precautions discussed, patient verbalized understanding.    Final Clinical Impressions(s) / UC Diagnoses   Final diagnoses:  Right lower quadrant abdominal pain     Discharge Instructions      It is unclear what is causing your abdominal pain. Your urinalysis is negative for signs of urinary tract infection. I suspect you may be experiencing a little bit of constipation. I'd like for you to take a daily stool softener to keep your stools regular. Increase fiber in your  diet (whole grains, fruits/vegetables, etc). Decreased the amount of lemonade/flavored water you are drinking as this is known to be a urinary irritant and can cause some urinary symptoms. Drink mostly plain water.  If you develop any new or worsening symptoms or if your symptoms do not start to improve, please return here or follow-up with your primary care provider. If your symptoms are severe, please go to the emergency room.    ED Prescriptions     Medication Sig Dispense Auth. Provider   docusate sodium (COLACE) 100 MG capsule Take 1 capsule (100 mg total) by mouth daily. 30 capsule Carlisle Beers, FNP      PDMP not reviewed this encounter.   Carlisle Beers, [place] 01/18/23 1815

## 2023-01-18 NOTE — Discharge Instructions (Addendum)
It is unclear what is causing your abdominal pain. Your urinalysis is negative for signs of urinary tract infection. I suspect you may be experiencing a little bit of constipation. I'd like for you to take a daily stool softener to keep your stools regular. Increase fiber in your diet (whole grains, fruits/vegetables, etc). Decreased the amount of lemonade/flavored water you are drinking as this is known to be a urinary irritant and can cause some urinary symptoms. Drink mostly plain water.  If you develop any new or worsening symptoms or if your symptoms do not start to improve, please return here or follow-up with your primary care provider. If your symptoms are severe, please go to the emergency room.

## 2023-01-23 ENCOUNTER — Other Ambulatory Visit (HOSPITAL_COMMUNITY): Payer: Self-pay

## 2023-02-02 NOTE — Progress Notes (Signed)
 [facility]   MULTIDISCIPLINARY HEART VALVE CLINIC                                     Cardiology Office Note:    Date:  02/05/2023   ID:  Heather Crawford, DOB 07-04-1945, MRN [id number]  PCP:  Joshua Debby CROME, MD  [facility] Cardiologist:  Ozell Fell, MD  [facility] Electrophysiologist:  Will Gladis Norton, MD   Referring MD: Joshua Debby CROME, MD   1 month s/p TAVR  History of Present Illness:    Heather Crawford is a 77 y.o. 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  Heather Crawford has been followed for progressive prosthetic aortic valve stenosis over the past several years. Recently she has been suffering from increasing DOE. Echo 10/14/22 showed EF 55% and severe bioprosthetic aortic valve stenosis with a mean gradient of , mild AI, as well as severe MAC with mild MR. [facility] 11/26/22 showed patent left main, LAD, and left circumflex, with calcification and mild nonobstructive plaquing stable from the previous cardiac catheterization study as well as chronic occlusion of the RCA with patent RIMA to RCA graft. She underwent CTA 11/02/22 with femoral access however her ascending aorta was noted to be highly calcified. Also her RCA was noted to be low takeoff but cath showed a patent RIMA to RCA.   S/p VIV TAVR with a 23 mm Edwards Sapien 3 Ultra Resilia THV via the TF approach on 01/05/23. Sentinel cerebral embolic protection was used given VIV TAVR. The surgical valve was fractured. Post operative echo showed EF 55%, mod concentric LVH, normally functioning TAVR with a mean gradient of 13.3 mmHg and no PVL as well as moderate MAC and mild MR. Discharged on aspirin   and Plavix  x 3 months followed by aspirin  alone.   Today the patient presents to clinic for follow up. Here with her husband. No CP. Has some mild SOB with exertion but more stamina since her TAVR. No LE edema, orthopnea or PND. Occasionally gets some dizziness but no syncope. No blood in stool or urine. No palpitations.    Past Medical History:  Diagnosis Date   Bilateral carotid artery disease (HCC) 08/15/2012   Carotid US  11/2021: Bilat ICA 1-39   CAD (coronary artery disease)    a. s/p CABG 4/12: RIMA-RCA   Carotid artery disease (HCC)    Carotid US  10/21: Bilateral ICA 1-39; left subclavian stenosis   CHF (congestive heart failure) (HCC)    chronic diastolic CHF   Chronic back pain    H/O BACK SURGERY   Coronary artery disease    adenosine  myoview  2/12 w/EF 56%, ISCHEMIA W/SCAR IN THE MID TO APICAL ANTERIO WALL, SEPTAL WALL, AND APEX. LHC 3/12 W/EF 55%, 40% OSTIAL RCA, 60% MID RCA, 95% DISTAL RC A, INTERVENTION  ATTEMPTED BUT UNABLE TO ADEQUADATELY SEAT  CATHETER.   Diverticulosis    Esophageal reflux    Glucose intolerance (impaired glucose tolerance)    A1C 6.3 (4/12)   H/O hiatal hernia    H/O vertigo    with fluid  in ears   Hemorrhoids  History of rheumatic fever    Hx of hysterectomy    Hx: UTI (urinary tract infection)    Hyperlipidemia    LBBB (left bundle branch block)    PPD positive    in the past   Prosthetic valve failure    S/P VIV TAVR (transcatheter aortic valve replacement) 01/05/2023   s/p VIV TAVR with a 23 mm Edwards S3UR via the TF approach by Dr. Wonda and Maryjane. The surgical valve was fractured.   Squamous cell carcinoma of skin    nose   Stenosis of left subclavian artery (HCC)    Carotid US  10/22: Bilateral ICA 1-39; L subclavian stenosis   Valvular heart disease s/p AVR in 2012 02/07/2010   AoV Replacement with Bovine prosthesis ' 12 Echo 2/23: EF 60-65, no RWMA, GR 1 DD, normal RVSF, normal PASP, AVR without aortic stenosis or AI (mean  gradient 22 mmHg)     Current Medications: Current Meds  Medication Sig   acetaminophen  (TYLENOL ) 325 MG tablet Take 1-2 tablets (325-650 mg total) by mouth every 4 (four) hours as needed for mild pain.   amLODipine  (NORVASC ) 2.5 MG tablet Take 1 tablet (2.5 mg total) by mouth daily.   aspirin  EC 81 MG tablet Take 1 tablet (81 mg total) by mouth daily. Swallow whole.   azelastine  (ASTELIN ) 0.1 % nasal spray Place 2 sprays into both nostrils 2 (two) times daily. Use in each nostril as directed   azithromycin  (ZITHROMAX ) 500 MG tablet Take 1 tablet (500 mg total) by mouth as directed. Take one tablet 1 hour before any dental work including cleanings.   carboxymethylcellulose (REFRESH TEARS) 0.5 % SOLN Place 1 drop into both eyes 3 (three) times daily as needed (Burning eyes).   cetirizine (ZYRTEC) 10 MG tablet Take 10 mg by mouth every morning. Alternate every 4 weeks with loratadine    Cholecalciferol  2000 units TABS Take 1 tablet (2,000 Units total) by mouth daily.   clopidogrel  (PLAVIX ) 75 MG tablet Take 1 tablet (75 mg total) by mouth daily with breakfast.   docusate sodium  (COLACE) 100 MG capsule Take 1 capsule (100 mg total) by mouth daily.   ezetimibe  (ZETIA ) 10 MG tablet Take 1 tablet (10 mg total) by mouth daily.   fluorouracil  (EFUDEX ) 5 % cream Apply topically 2 (two) times daily. START ON JANUARY 1st, 2025   fluticasone  (FLONASE ) 50 MCG/ACT nasal spray USE 1 SPRAY IN EACH NOSTRIL EVERY DAY (Patient taking differently: Place 1 spray into both nostrils daily as needed for allergies or rhinitis.)   hydrocortisone  2.5 % cream Apply topically 2 (two) times daily.   irbesartan  (AVAPRO ) 150 MG tablet Take 0.5 tablets (75 mg total) by mouth in the morning and at bedtime.   isosorbide  mononitrate (IMDUR ) 30 MG 24 hr tablet Take 1 tablet (30 mg total) by mouth daily.   loratadine  (CLARITIN ) 10 MG tablet Take 10 mg by mouth daily as needed for allergies. Alternate with Zyrtec every 4 weeks    meclizine  (ANTIVERT ) 25 MG tablet Take 25 mg by mouth 3 (three) times daily as needed for dizziness. OTC   montelukast  (SINGULAIR ) 10 MG tablet Take 1 tablet (10 mg total) by mouth at bedtime.   pantoprazole  (PROTONIX ) 40 MG tablet Take 1 tablet (40 mg total) by mouth daily.   simvastatin  (ZOCOR ) 40 MG tablet Take 1 tablet (40 mg total) by mouth daily.      ROS:   Please see the history of present illness.    All  other systems reviewed and are negative.  EKGs       Risk Assessment/Calculations:           Physical Exam:    VS:  BP 110/60   Pulse 72   Ht 5' 2 (1.575 m)   Wt 165 lb (74.8 kg)   SpO2 96%   BMI 30.18 kg/m     Wt Readings from Last 3 Encounters:  02/05/23 165 lb (74.8 kg)  01/18/23 163 lb (73.9 kg)  01/13/23 164 lb (74.4 kg)     GEN: Well nourished, well developed in no acute distress NECK: No JVD CARDIAC: RRR, 1/6 flow murmur @ RUSB. No rubs, gallops RESPIRATORY:  Clear to auscultation without rales, wheezing or rhonchi  ABDOMEN: Soft, non-tender, non-distended EXTREMITIES:  No edema; No deformity.     ASSESSMENT:    1. S/P VIV TAVR (transcatheter aortic valve replacement)   2. Gastroesophageal reflux disease, unspecified whether esophagitis present   3. Essential hypertension   4. Pacemaker   5. Coronary artery disease involving native coronary artery of native heart with angina pectoris (HCC)   6. Chronic heart failure with preserved ejection fraction (HCC)   7. Pulmonary nodules      PLAN:    In order of problems listed above:  Severe bioprosthetic AS s/p VIV TAVR: echo today shows EF 60%, normally functioning TAVR with a mean gradient of 9 mmHg and no PVL. She has NYHA class II symptoms, but has had improvement since TAVR. SBE discussed; she has azithromycin . Continue aspirin  81mg  daily and Plavix  75 mg daily x 3 months followed by aspirin  alone (end date early March when her Rx runs out). I will see back for 1 year echo and OV.  GERD:  omeprazole  changed to pantoprazole  while on Plavix  given drug drug interaction. Once she is off Plaivx she can resume omeprazole .   HTN: BP well controlled on Norvasc  2.5mg  daily, irbesartan  75mg  BID, imdur  30mg  daily and Lasix  20mg  daily. No changes made today.    CHB s/p PPM: followed by Dr. Inocencio.   CAD: [facility] 11/26/22 showed patent left main, LAD, and left circumflex, with calcification and mild nonobstructive plaquing stable from the previous cardiac catheterization study as well as chronic occlusion of the RCA with patent RIMA to RCA graft. Continue medical therapy with aspirin  81mg  daily and simvastatin  40mg  daily.    HFpEF: appears euvolemic. Continue Lasix  20mg  daily.   Pulmonary nodules: pre TAVR CT showed small solid pulmonary nodules in the right middle lobe along the minor fissure, largest 0.3 cm. No follow-up needed if patient is low-risk (and has no known or suspected primary neoplasm). Non-contrast chest CT can be considered in 12 months if patient is high-risk.  Discussed with pt and given breast cancer history will get this set up at our 1 year office visit.   Medication Adjustments/Labs and Tests Ordered: Current medicines are reviewed at length with the patient today.  Concerns regarding medicines are outlined above.  Orders Placed This Encounter  Procedures   ECHOCARDIOGRAM COMPLETE   No orders of the defined types were placed in this encounter.   Patient Instructions  Medication Instructions:  Your physician recommends that you continue on your current medications as directed. Please refer to the Current Medication list given to you today. Please stop your Plavix  when the pills run out in early March 2025. Continue on Aspirin .   *If you need a refill on your cardiac medications before your next appointment, please call your  pharmacy*   Lab Work: NONE  If you have labs (blood work) drawn today and your tests are completely normal, you will receive your results  only by: MyChart Message (if you have MyChart) OR A paper copy in the mail If you have any lab test that is abnormal or we need to change your treatment, we will call you to review the results.   Testing/Procedures: Your physician has requested that you have an echocardiogram. Echocardiography is a painless test that uses sound waves to create images of your heart. It provides your doctor with information about the size and shape of your heart and how well your heart's chambers and valves are working. This procedure takes approximately one hour. There are no restrictions for this procedure. Please do NOT wear cologne, perfume, aftershave, or lotions (deodorant is allowed). Please arrive 15 minutes prior to your appointment time.  Please note: We ask at that you not bring children with you during ultrasound (echo/ vascular) testing. Due to room size and safety concerns, children are not allowed in the ultrasound rooms during exams. Our front office staff cannot provide observation of children in our lobby area while testing is being conducted. An adult accompanying a patient to their appointment will only be allowed in the ultrasound room at the discretion of the ultrasound technician under special circumstances. We apologize for any inconvenience.    Follow-Up:As scheduled  At [facility], you and your health needs are our priority.  As part of our continuing mission to provide you with exceptional heart care, we have created designated Provider Care Teams.  These Care Teams include your primary Cardiologist (physician) and Advanced Practice Providers (APPs -  Physician Assistants and Nurse Practitioners) who all work together to provide you with the care you need, when you need it.           Signed, Lamarr Hummer, PA-C  02/05/2023 7:16 PM    [facility]

## 2023-02-05 ENCOUNTER — Ambulatory Visit (INDEPENDENT_AMBULATORY_CARE_PROVIDER_SITE_OTHER): Payer: HMO | Admitting: Physician Assistant

## 2023-02-05 ENCOUNTER — Ambulatory Visit: Payer: HMO | Attending: Cardiology

## 2023-02-05 VITALS — BP 110/60 | HR 72 | Ht 62.0 in | Wt 165.0 lb

## 2023-02-05 DIAGNOSIS — I5032 Chronic diastolic (congestive) heart failure: Secondary | ICD-10-CM

## 2023-02-05 DIAGNOSIS — Z952 Presence of prosthetic heart valve: Secondary | ICD-10-CM | POA: Insufficient documentation

## 2023-02-05 DIAGNOSIS — Z950 Presence of cardiac pacemaker: Secondary | ICD-10-CM | POA: Diagnosis not present

## 2023-02-05 DIAGNOSIS — R918 Other nonspecific abnormal finding of lung field: Secondary | ICD-10-CM | POA: Diagnosis not present

## 2023-02-05 DIAGNOSIS — I10 Essential (primary) hypertension: Secondary | ICD-10-CM

## 2023-02-05 DIAGNOSIS — I25119 Atherosclerotic heart disease of native coronary artery with unspecified angina pectoris: Secondary | ICD-10-CM

## 2023-02-05 DIAGNOSIS — K219 Gastro-esophageal reflux disease without esophagitis: Secondary | ICD-10-CM

## 2023-02-05 LAB — ECHOCARDIOGRAM COMPLETE
AV Mean grad: 9 mm[Hg]
AV Peak grad: 16.8 mm[Hg]
Ao pk vel: 2.05 m/s
Area-P 1/2: 2.58 cm2
S' Lateral: 2.9 cm

## 2023-02-05 NOTE — Patient Instructions (Addendum)
 Medication Instructions:  Your physician recommends that you continue on your current medications as directed. Please refer to the Current Medication list given to you today. Please stop your Plavix  when the pills run out in early March 2025. Continue on Aspirin .   *If you need a refill on your cardiac medications before your next appointment, please call your pharmacy*   Lab Work: NONE  If you have labs (blood work) drawn today and your tests are completely normal, you will receive your results only by: MyChart Message (if you have MyChart) OR A paper copy in the mail If you have any lab test that is abnormal or we need to change your treatment, we will call you to review the results.   Testing/Procedures: Your physician has requested that you have an echocardiogram. Echocardiography is a painless test that uses sound waves to create images of your heart. It provides your doctor with information about the size and shape of your heart and how well your heart's chambers and valves are working. This procedure takes approximately one hour. There are no restrictions for this procedure. Please do NOT wear cologne, perfume, aftershave, or lotions (deodorant is allowed). Please arrive 15 minutes prior to your appointment time.  Please note: We ask at that you not bring children with you during ultrasound (echo/ vascular) testing. Due to room size and safety concerns, children are not allowed in the ultrasound rooms during exams. Our front office staff cannot provide observation of children in our lobby area while testing is being conducted. An adult accompanying a patient to their appointment will only be allowed in the ultrasound room at the discretion of the ultrasound technician under special circumstances. We apologize for any inconvenience.    Follow-Up:As scheduled  At [facility], you and your health needs are our priority.  As part of our continuing mission to provide you with  exceptional heart care, we have created designated Provider Care Teams.  These Care Teams include your primary Cardiologist (physician) and Advanced Practice Providers (APPs -  Physician Assistants and Nurse Practitioners) who all work together to provide you with the care you need, when you need it.

## 2023-02-06 ENCOUNTER — Other Ambulatory Visit (HOSPITAL_COMMUNITY): Payer: Self-pay

## 2023-02-08 ENCOUNTER — Other Ambulatory Visit: Payer: Self-pay

## 2023-02-15 DIAGNOSIS — R059 Cough, unspecified: Secondary | ICD-10-CM | POA: Diagnosis not present

## 2023-02-15 DIAGNOSIS — J411 Mucopurulent chronic bronchitis: Secondary | ICD-10-CM | POA: Diagnosis not present

## 2023-02-15 DIAGNOSIS — R0981 Nasal congestion: Secondary | ICD-10-CM | POA: Diagnosis not present

## 2023-02-15 DIAGNOSIS — J3489 Other specified disorders of nose and nasal sinuses: Secondary | ICD-10-CM | POA: Diagnosis not present

## 2023-02-19 NOTE — Progress Notes (Signed)
Remote pacemaker transmission.   

## 2023-02-22 ENCOUNTER — Ambulatory Visit: Payer: HMO | Admitting: Dermatology

## 2023-03-11 ENCOUNTER — Other Ambulatory Visit (HOSPITAL_COMMUNITY): Payer: Self-pay

## 2023-03-11 ENCOUNTER — Other Ambulatory Visit: Payer: Self-pay

## 2023-04-05 ENCOUNTER — Other Ambulatory Visit (HOSPITAL_COMMUNITY): Payer: Self-pay

## 2023-04-14 ENCOUNTER — Encounter (HOSPITAL_BASED_OUTPATIENT_CLINIC_OR_DEPARTMENT_OTHER): Payer: Self-pay | Admitting: Family Medicine

## 2023-04-14 ENCOUNTER — Ambulatory Visit (HOSPITAL_BASED_OUTPATIENT_CLINIC_OR_DEPARTMENT_OTHER): Admitting: Radiology

## 2023-04-14 ENCOUNTER — Other Ambulatory Visit (HOSPITAL_BASED_OUTPATIENT_CLINIC_OR_DEPARTMENT_OTHER): Payer: Self-pay

## 2023-04-14 ENCOUNTER — Ambulatory Visit (HOSPITAL_BASED_OUTPATIENT_CLINIC_OR_DEPARTMENT_OTHER)
Admission: EM | Admit: 2023-04-14 | Discharge: 2023-04-14 | Disposition: A | Discharge status: Home / Self Care | Attending: Family Medicine | Admitting: Family Medicine

## 2023-04-14 DIAGNOSIS — M79621 Pain in right upper arm: Secondary | ICD-10-CM

## 2023-04-14 DIAGNOSIS — S40021A Contusion of right upper arm, initial encounter: Secondary | ICD-10-CM

## 2023-04-14 DIAGNOSIS — M79601 Pain in right arm: Secondary | ICD-10-CM | POA: Diagnosis not present

## 2023-04-14 DIAGNOSIS — M25511 Pain in right shoulder: Secondary | ICD-10-CM

## 2023-04-14 MED ORDER — CELECOXIB 200 MG PO CAPS
200.0000 mg | ORAL_CAPSULE | Freq: Two times a day (BID) | ORAL | 0 refills | Status: DC | PRN
  Filled 2023-04-14: qty 30, 15d supply, fill #0

## 2023-04-14 NOTE — Discharge Instructions (Addendum)
 X-rays appear negative.  Renal function from 01/06/2023 looks great.  Will treat with Celebrex, 200 mg twice daily for up to 15 days for right shoulder pain.  Use ice to the shoulder 30 minutes on 30 minutes off for 1 to 2 days.  Then try heat packs but be careful of burns.  Follow-up if symptoms do not improve, worsen or new symptoms occur.  Provided a right arm sling to take some pressure off her shoulder.

## 2023-04-14 NOTE — Progress Notes (Signed)
 Right Humerus and Shoulder X-Ray IMPRESSION:   Negative radiographs of the right humerus.  Patient updated on this report.

## 2023-04-14 NOTE — ED Provider Notes (Signed)
 [facility]    CSN: [id number] Arrival date & time: 04/14/23  1553      History   Chief Complaint No chief complaint on file.   HPI Heather Crawford is a 78 y.o. female.   The patient fell last night (04/14/23) and when she fell she fell forward and hit her right shoulder on the ground.  She has right upper arm and right shoulder and right upper back pain.  Her daughter from out of her to get x-rays to be sure that nothing was broken.     Past Medical History:  Diagnosis Date   Bilateral carotid artery disease (HCC) 08/15/2012   Carotid US 11/2021: Bilat ICA 1-39   CAD (coronary artery disease)    a. s/p CABG 4/12: RIMA-RCA   Carotid artery disease (HCC)    Carotid US 10/21: Bilateral ICA 1-39; left subclavian stenosis   CHF (congestive heart failure) (HCC)    chronic diastolic CHF   Chronic back pain    H/O BACK SURGERY   Coronary artery disease    adenosine myoview 2/12 w/EF 56%, ISCHEMIA W/SCAR IN THE MID TO APICAL ANTERIO WALL, SEPTAL WALL, AND APEX. LHC 3/12 W/EF 55%, 40% OSTIAL RCA, 60% MID RCA, 95% DISTAL RC A, INTERVENTION  ATTEMPTED BUT UNABLE TO ADEQUADATELY SEAT  CATHETER.   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 Excell Seltzer and [name]. The surgical valve was fractured.   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   UARS (upper airway resistance syndrome) 07/06/2022   Upper airway cough syndrome 07/06/2022   Pacemaker 10/06/2021   Stage 3a chronic kidney disease (HCC) 07/03/2021   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   (HFpEF) heart failure with preserved ejection fraction (HCC) 03/13/2021   Stenosis of left subclavian artery (HCC) 11/18/2020   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 06/20/2019   Vertigo, benign paroxysmal, bilateral 04/04/2018   Rheumatic heart disease 05/22/2017   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   Hyperlipidemia 08/16/2013   Osteopenia 08/16/2013   Type II diabetes mellitus with manifestations (HCC) 08/16/2013   Bilateral carotid artery disease (HCC) 08/15/2012   Atypical lobular hyperplasia of breast 10/08/2011   Coronary artery disease involving native coronary artery of native heart with angina pectoris (HCC) 04/18/2010   Essential hypertension 03/26/2010   Valvular heart  disease s/p AVR in 2012 02/07/2010   GERD 02/07/2010    Past Surgical History:  Procedure Laterality Date   ABDOMINAL HYSTERECTOMY     APPENDECTOMY  1973   BACK SURGERY     Lumbar Lam and discectomy x 2   BREAST BIOPSY Right 10/2011   BREAST LUMPECTOMY Right    CARDIAC CATHETERIZATION  2012   CARDIAC VALVE REPLACEMENT  2012   Aortic   CORONARY ARTERY BYPASS GRAFT  2012   DILATION AND CURETTAGE OF UTERUS     ENDOMETRIAL ABLATION     INTRAOPERATIVE TRANSTHORACIC ECHOCARDIOGRAM N/A 01/05/2023   Procedure: INTRAOPERATIVE TRANSTHORACIC ECHOCARDIOGRAM;  Surgeon: Tonny Bollman, MD;  Location: [facility] INVASIVE CV LAB;  Service: Cardiovascular;  Laterality: N/A;   LAPAROSCOPIC LYSIS  INTESTINAL ADHESIONS     LUMBAR DISC SURGERY     DR. Deneen Harts- [place]   PACEMAKER IMPLANT N/A 01/17/2020   Procedure: PACEMAKER IMPLANT;  Surgeon: Regan Lemming, MD;  Location: [facility] INVASIVE CV LAB;  Service: Cardiovascular;  Laterality: N/A;   RIGHT/LEFT HEART CATH AND CORONARY/GRAFT ANGIOGRAPHY N/A 04/07/2021   Procedure: RIGHT/LEFT HEART CATH AND CORONARY/GRAFT ANGIOGRAPHY;  Surgeon: Yvonne Kendall, MD;  Location: [facility] INVASIVE CV LAB;  Service: Cardiovascular;  Laterality: N/A;   RIGHT/LEFT HEART CATH AND CORONARY/GRAFT ANGIOGRAPHY N/A 11/26/2022   Procedure: RIGHT/LEFT HEART CATH AND CORONARY/GRAFT ANGIOGRAPHY;  Surgeon: Tonny Bollman, MD;  Location: [facility] INVASIVE CV LAB;  Service: Cardiovascular;  Laterality: N/A;   SKIN SURGERY     skin sarcomas removed   TONSILLECTOMY     1955   TONSILLECTOMY      OB History   No obstetric history on file.      Home Medications    Prior to Admission medications   Medication Sig Start Date End Date Taking? Authorizing Provider  amLODipine (NORVASC) 2.5 MG tablet Take 1 tablet (2.5 mg total) by mouth daily. 06/02/22  Yes Tereso Newcomer T, PA-C  aspirin EC 81 MG tablet Take 1 tablet (81 mg total) by mouth daily. Swallow whole. 07/03/20  Yes Weaver, Scott T, PA-C  azelastine (ASTELIN) 0.1 % nasal spray Place 2 sprays into both nostrils 2 (two) times daily. Use in each nostril as directed 12/03/22  Yes [name], MD  celecoxib (CELEBREX) 200 MG capsule Take 1 capsule (200 mg total) by mouth 2 (two) times daily as needed for up to 15 days. 04/14/23 04/29/23 Yes Prescilla Sours, FNP  cetirizine (ZYRTEC) 10 MG tablet Take 10 mg by mouth every morning. Alternate every 4 weeks with loratadine   Yes [provider]  Cholecalciferol 2000 units TABS Take 1 tablet (2,000 Units total) by mouth daily. 02/17/17  Yes [name], MD  ezetimibe (ZETIA) 10 MG tablet Take 1 tablet (10 mg total) by mouth daily. 06/02/22  Yes Weaver, Scott T, PA-C   fluticasone (FLONASE) 50 MCG/ACT nasal spray USE 1 SPRAY IN EACH NOSTRIL EVERY DAY Patient taking differently: Place 1 spray into both nostrils daily as needed for allergies or rhinitis. 11/10/21  Yes [name], MD  irbesartan (AVAPRO) 150 MG tablet Take 0.5 tablets (75 mg total) by mouth in the morning and at bedtime. 06/02/22  Yes Weaver, Scott T, PA-C  isosorbide mononitrate (IMDUR) 30 MG 24 hr tablet Take 1 tablet (30 mg total) by mouth daily. 06/02/22  Yes Weaver, Scott T, PA-C  loratadine (CLARITIN) 10 MG tablet Take 10 mg by mouth daily as needed for allergies. Alternate with Zyrtec every 4 weeks  Yes [provider]  meclizine (ANTIVERT) 25 MG tablet Take 25 mg by mouth 3 (three) times daily as needed for dizziness. OTC   Yes [provider]  montelukast (SINGULAIR) 10 MG tablet Take 1 tablet (10 mg total) by mouth at bedtime. 12/03/22  Yes [name], MD  nitroGLYCERIN (NITROSTAT) 0.4 MG SL tablet Place 1 tablet (0.4 mg total) under the tongue every 5 (five) minutes as needed for chest pain. 06/02/22 04/14/23 Yes Weaver, Scott T, PA-C  simvastatin (ZOCOR) 40 MG tablet Take 1 tablet (40 mg total) by mouth daily. 06/02/22  Yes Weaver, Scott T, PA-C  acetaminophen (TYLENOL) 325 MG tablet Take 1-2 tablets (325-650 mg total) by mouth every 4 (four) hours as needed for mild pain. 01/18/20   Graciella Freer, PA-C  azithromycin (ZITHROMAX) 500 MG tablet Take 1 tablet (500 mg total) by mouth as directed. Take one tablet 1 hour before any dental work including cleanings. 11/27/22   Janetta Hora, PA-C  carboxymethylcellulose (REFRESH TEARS) 0.5 % SOLN Place 1 drop into both eyes 3 (three) times daily as needed (Burning eyes).    [provider]  clopidogrel (PLAVIX) 75 MG tablet Take 1 tablet (75 mg total) by mouth daily with breakfast. 01/07/23   Janetta Hora, PA-C  docusate sodium (COLACE) 100 MG capsule Take 1 capsule (100 mg total) by mouth  daily. 01/18/23   Carlisle Beers, FNP  fluorouracil (EFUDEX) 5 % cream Apply topically 2 (two) times daily. START ON JANUARY 1st, 2025 12/21/22   Terri Piedra, DO  furosemide (LASIX) 20 MG tablet Take 1 tablet (20 mg total) by mouth daily. 10/15/22 01/13/23  Tereso Newcomer T, PA-C  hydrocortisone 2.5 % cream Apply topically 2 (two) times daily. 12/21/22   Terri Piedra, DO  pantoprazole (PROTONIX) 40 MG tablet Take 1 tablet (40 mg total) by mouth daily. 01/06/23 01/06/24  Janetta Hora, PA-C    Family History Family History  Problem Relation Age of Onset   Hypertension Mother    Breast cancer Mother    Hypothyroidism Mother    Diabetes type II Mother    Heart disease Mother    Heart attack Father        CABG @ 69   CAD Father    Breast cancer Sister        x 2   Colon cancer Maternal Aunt    Breast cancer Maternal Aunt    Colon cancer Paternal Uncle    Pancreatic cancer Other        PGA   Breast cancer Paternal Grandmother    Stomach cancer Neg Hx    Throat cancer Neg Hx    Esophageal cancer Neg Hx     Social History Social History   Tobacco Use   Smoking status: Former    Current packs/day: 0.00    Average packs/day: 1 pack/day for 40.0 years (40.0 ttl pk-yrs)    Types: Cigarettes    Start date: 02/03/1960    Quit date: 02/03/2000    Years since quitting: 23.2    Passive exposure: Never   Smokeless tobacco: Former    Types: Chew   Tobacco comments:    discussed LDCT   Vaping Use   Vaping status: Never Used  Substance Use Topics   Alcohol use: No    Alcohol/week: 0.0 standard drinks of alcohol   Drug use: No     Allergies   Crestor [rosuvastatin], Lipitor [atorvastatin], Sulfamethoxazole-trimethoprim, Tape,  Amoxicillin, Neomycin, Neosporin [neomycin-bacitracin zn-polymyx], and Polysporin [bacitracin-polymyxin b]   Review of Systems Review of Systems  Constitutional:  Negative for chills and fever.  HENT:  Negative for ear pain and sore  throat.   Eyes:  Negative for pain and visual disturbance.  Respiratory:  Negative for cough and shortness of breath.   Cardiovascular:  Negative for chest pain and palpitations.  Gastrointestinal:  Negative for abdominal pain, constipation, diarrhea, nausea and vomiting.  Genitourinary:  Negative for dysuria and hematuria.  Musculoskeletal:  Positive for myalgias (right shoulder, upper arm and upper back pain.). Negative for arthralgias and back pain.  Skin:  Negative for color change and rash.  Neurological:  Negative for seizures and syncope.  All other systems reviewed and are negative.    Physical Exam Triage Vital Signs ED Triage Vitals  Encounter Vitals Group     BP 04/14/23 1603 113/73     Systolic BP Percentile --      Diastolic BP Percentile --      Pulse Rate 04/14/23 1603 83     Resp 04/14/23 1603 18     Temp 04/14/23 1603 97.7 F (36.5 C)     Temp Source 04/14/23 1603 Oral     SpO2 04/14/23 1603 94 %     Weight --      Height --      Head Circumference --      Peak Flow --      Pain Score 04/14/23 1559 3     Pain Loc --      Pain Education --      Exclude from Growth Chart --    No data found.  Updated Vital Signs BP 113/73 (BP Location: Left Arm)   Pulse 83   Temp 97.7 F (36.5 C) (Oral)   Resp 18   SpO2 94%   Visual Acuity Right Eye Distance:   Left Eye Distance:   Bilateral Distance:    Right Eye Near:   Left Eye Near:    Bilateral Near:     Physical Exam Constitutional:      General: She is not in acute distress.    Appearance: She is not ill-appearing or toxic-appearing.  HENT:     Head: Normocephalic and atraumatic.     Right Ear: External ear normal.     Left Ear: External ear normal.     Nose: Nose normal.     Mouth/Throat:     Lips: Pink.     Mouth: Mucous membranes are dry.  Eyes:     Pupils: Pupils are equal, round, and reactive to light.  Cardiovascular:     Rate and Rhythm: Normal rate.     Heart sounds: S1 normal and S2  normal. No murmur heard. Pulmonary:     Effort: Pulmonary effort is normal.     Breath sounds: Normal breath sounds. No decreased breath sounds, wheezing, rhonchi or rales.  Musculoskeletal:     Right shoulder: Tenderness (along upper chest, right shoulder, upper back.) present. No swelling, deformity, effusion or laceration. Decreased range of motion (due to pain).     Left shoulder: Normal.     Right upper arm: Tenderness present.     Left upper arm: Normal.  Lymphadenopathy:     Head:     Right side of head: No submental, submandibular, tonsillar, preauricular or posterior auricular adenopathy.     Left side of head: No submental, submandibular, tonsillar, preauricular or posterior auricular adenopathy.  Cervical: No cervical adenopathy.     Right cervical: No superficial cervical adenopathy.    Left cervical: No superficial cervical adenopathy.  Skin:    Findings: No rash.  Neurological:     Mental Status: She is alert and oriented to person, place, and time.      UC Treatments / Results  Labs (all labs ordered are listed, but only abnormal results are displayed) Basic metabolic panel: 01/06/23:       Component Ref Range & Units (hover) 3 mo ago (01/06/23) 3 mo ago (01/05/23)  Sodium 135 138  Potassium 4.2 4.2  Chloride 102 103  CO2 26   Glucose, Bld 121 High  183 High  CM  Comment: Glucose reference range applies only to samples taken after fasting for at least 8 hours.  BUN 13 17  Creatinine, Ser 0.91 0.70  Calcium 8.4 Low    GFR, Estimated >60       EKG   Radiology No results found.  Procedures Procedures (including critical care time)  Medications Ordered in UC Medications - No data to display  Initial Impression / Assessment and Plan / UC Course  I have reviewed the triage vital signs and the nursing notes.  Pertinent labs & imaging results that were available during my care of the patient were reviewed by me and considered in my medical decision  making (see chart for details).     X-rays appear negative.  Will update the patient if the radiology review differs from my review.  Excellent kidney function from 01/06/2023.  Will treat with Celebrex, 200 mg twice daily if needed for shoulder pain.  May use for up to 15 days then needs to see a specialist.  Use ice to the shoulder 30 minutes on 30 minutes off for the next 12 to 24 hours.  And then switch to heat.  Be careful not to get any burns related to laying on a heating pad.  Follow-up if symptoms do not improve, worsen or new symptoms occur.  Provided a right arm sling to take some pressure off her right shoulder. Final Clinical Impressions(s) / UC Diagnoses   Final diagnoses:  Acute pain of right shoulder  Contusion of right upper arm, initial encounter  Pain in right upper arm     Discharge Instructions      X-rays appear negative.  Renal function from 01/06/2023 looks great.  Will treat with Celebrex, 200 mg twice daily for up to 15 days for right shoulder pain.  Use ice to the shoulder 30 minutes on 30 minutes off for 1 to 2 days.  Then try heat packs but be careful of burns.  Follow-up if symptoms do not improve, worsen or new symptoms occur.  Provided a right arm sling to take some pressure off her shoulder.     ED Prescriptions     Medication Sig Dispense Auth. Provider   celecoxib (CELEBREX) 200 MG capsule Take 1 capsule (200 mg total) by mouth 2 (two) times daily as needed for up to 15 days. 30 capsule Prescilla Sours, FNP      PDMP not reviewed this encounter.   Prescilla Sours, FNP 04/14/23 [id number]

## 2023-04-14 NOTE — ED Triage Notes (Signed)
 Pt fell yesterday evening she was going down the steps and had package in her arm and her son told her not to fall and about that time she fell and hit her right shoulder.

## 2023-04-15 ENCOUNTER — Other Ambulatory Visit: Payer: Self-pay | Admitting: Physician Assistant

## 2023-04-15 ENCOUNTER — Other Ambulatory Visit: Payer: Self-pay

## 2023-04-15 ENCOUNTER — Ambulatory Visit (INDEPENDENT_AMBULATORY_CARE_PROVIDER_SITE_OTHER): Payer: Medicare HMO

## 2023-04-15 ENCOUNTER — Other Ambulatory Visit (HOSPITAL_COMMUNITY): Payer: Self-pay

## 2023-04-15 DIAGNOSIS — I442 Atrioventricular block, complete: Secondary | ICD-10-CM

## 2023-04-15 MED ORDER — SIMVASTATIN 40 MG PO TABS
40.0000 mg | ORAL_TABLET | Freq: Every day | ORAL | 2 refills | Status: DC
  Filled 2023-04-15: qty 90, 90d supply, fill #0
  Filled 2023-07-30: qty 90, 90d supply, fill #1
  Filled 2023-10-31 – 2023-11-08 (×2): qty 90, 90d supply, fill #2

## 2023-04-15 MED ORDER — AMLODIPINE BESYLATE 2.5 MG PO TABS
2.5000 mg | ORAL_TABLET | Freq: Every day | ORAL | 2 refills | Status: DC
  Filled 2023-04-15: qty 90, 90d supply, fill #0

## 2023-04-15 MED ORDER — EZETIMIBE 10 MG PO TABS
10.0000 mg | ORAL_TABLET | Freq: Every day | ORAL | 2 refills | Status: DC
  Filled 2023-04-15: qty 90, 90d supply, fill #0
  Filled 2023-07-30: qty 90, 90d supply, fill #1
  Filled 2023-10-31 – 2023-11-08 (×2): qty 90, 90d supply, fill #2

## 2023-04-15 MED ORDER — ISOSORBIDE MONONITRATE ER 30 MG PO TB24
30.0000 mg | ORAL_TABLET | Freq: Every day | ORAL | 2 refills | Status: DC
  Filled 2023-04-15: qty 90, 90d supply, fill #0
  Filled 2023-07-30: qty 90, 90d supply, fill #1
  Filled 2023-10-31 – 2023-11-08 (×2): qty 90, 90d supply, fill #2

## 2023-04-16 LAB — CUP PACEART REMOTE DEVICE CHECK
Battery Remaining Longevity: 143 mo
Battery Voltage: 3.02 V
Brady Statistic AP VP Percent: 0.02 %
Brady Statistic AP VS Percent: 10.52 %
Brady Statistic AS VP Percent: 0.04 %
Brady Statistic AS VS Percent: 89.43 %
Brady Statistic RA Percent Paced: 10.63 %
Brady Statistic RV Percent Paced: 0.05 %
Date Time Interrogation Session: 20250313133951
Implantable Lead Connection Status: 753985
Implantable Lead Connection Status: 753985
Implantable Lead Implant Date: 20211215
Implantable Lead Implant Date: 20211215
Implantable Lead Location: 753859
Implantable Lead Location: 753860
Implantable Lead Model: 5076
Implantable Lead Model: 5076
Implantable Pulse Generator Implant Date: 20211215
Lead Channel Impedance Value: 304 Ohm
Lead Channel Impedance Value: 361 Ohm
Lead Channel Impedance Value: 418 Ohm
Lead Channel Impedance Value: 456 Ohm
Lead Channel Pacing Threshold Amplitude: 0.5 V
Lead Channel Pacing Threshold Amplitude: 0.625 V
Lead Channel Pacing Threshold Pulse Width: 0.4 ms
Lead Channel Pacing Threshold Pulse Width: 0.4 ms
Lead Channel Sensing Intrinsic Amplitude: 1.125 mV
Lead Channel Sensing Intrinsic Amplitude: 1.125 mV
Lead Channel Sensing Intrinsic Amplitude: 14.25 mV
Lead Channel Sensing Intrinsic Amplitude: 14.25 mV
Lead Channel Setting Pacing Amplitude: 1.5 V
Lead Channel Setting Pacing Amplitude: 2 V
Lead Channel Setting Pacing Pulse Width: 0.4 ms
Lead Channel Setting Sensing Sensitivity: 1.2 mV
Zone Setting Status: 755011

## 2023-04-19 ENCOUNTER — Other Ambulatory Visit: Payer: Self-pay

## 2023-04-21 DIAGNOSIS — H40003 Preglaucoma, unspecified, bilateral: Secondary | ICD-10-CM | POA: Diagnosis not present

## 2023-04-21 DIAGNOSIS — H2513 Age-related nuclear cataract, bilateral: Secondary | ICD-10-CM | POA: Diagnosis not present

## 2023-04-22 ENCOUNTER — Other Ambulatory Visit (HOSPITAL_BASED_OUTPATIENT_CLINIC_OR_DEPARTMENT_OTHER): Payer: Self-pay

## 2023-04-22 ENCOUNTER — Encounter (HOSPITAL_BASED_OUTPATIENT_CLINIC_OR_DEPARTMENT_OTHER): Payer: Self-pay

## 2023-04-22 ENCOUNTER — Ambulatory Visit (HOSPITAL_BASED_OUTPATIENT_CLINIC_OR_DEPARTMENT_OTHER)
Admission: EM | Admit: 2023-04-22 | Discharge: 2023-04-22 | Disposition: A | Discharge status: Home / Self Care | Attending: Family Medicine | Admitting: Family Medicine

## 2023-04-22 DIAGNOSIS — R21 Rash and other nonspecific skin eruption: Secondary | ICD-10-CM | POA: Diagnosis not present

## 2023-04-22 MED ORDER — PREDNISONE 20 MG PO TABS
40.0000 mg | ORAL_TABLET | Freq: Every day | ORAL | 0 refills | Status: AC
  Filled 2023-04-22: qty 10, 5d supply, fill #0

## 2023-04-22 MED ORDER — FAMOTIDINE 20 MG PO TABS
20.0000 mg | ORAL_TABLET | Freq: Once | ORAL | Status: AC
  Administered 2023-04-22: 20 mg via ORAL

## 2023-04-22 MED ORDER — DEXAMETHASONE SODIUM PHOSPHATE 10 MG/ML IJ SOLN: 10.0000 mg | Freq: Once | INTRAMUSCULAR | Status: DC

## 2023-04-22 MED ORDER — DEXAMETHASONE SODIUM PHOSPHATE 10 MG/ML IJ SOLN
10.0000 mg | Freq: Once | INTRAMUSCULAR | Status: AC
  Administered 2023-04-22: 10 mg via INTRAMUSCULAR

## 2023-04-22 NOTE — ED Provider Notes (Signed)
 [facility] C[facility]    CSN: [id number] Arrival date & time: 04/22/23  0849      History   Chief Complaint Chief Complaint  Patient presents with   Rash    HPI Heather Crawford is a 78 y.o. female.   78 year old female presents today with rash generalized to face, neck, back and chest area.  This started 2 days ago but worsened last night.  Reports was recently here and started new medication Celebrex for pain after having a fall and injuring right arm.  She is also wearing a sling.  Reports the sling was irritating her neck and burning underneath.  She also admits to being outside in the garden.  The rash is very itchy and burning.  She is feeling tightness in her neck but no trouble swallowing or breathing. She took Benadryl last night and Singulair. No fever.    Rash   Past Medical History:  Diagnosis Date   Bilateral carotid artery disease (HCC) 08/15/2012   Carotid US 11/2021: Bilat ICA 1-39   CAD (coronary artery disease)    a. s/p CABG 4/12: RIMA-RCA   Carotid artery disease (HCC)    Carotid US 10/21: Bilateral ICA 1-39; left subclavian stenosis   CHF (congestive heart failure) (HCC)    chronic diastolic CHF   Chronic back pain    H/O BACK SURGERY   Coronary artery disease    adenosine myoview 2/12 w/EF 56%, ISCHEMIA W/SCAR IN THE MID TO APICAL ANTERIO WALL, SEPTAL WALL, AND APEX. LHC 3/12 W/EF 55%, 40% OSTIAL RCA, 60% MID RCA, 95% DISTAL RC A, INTERVENTION  ATTEMPTED BUT UNABLE TO ADEQUADATELY SEAT  CATHETER.   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 Excell Seltzer and [name]. The surgical valve was fractured.   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   UARS (upper airway resistance syndrome) 07/06/2022   Upper airway cough syndrome 07/06/2022   Pacemaker 10/06/2021   Stage 3a chronic kidney disease (HCC) 07/03/2021   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   (HFpEF) heart failure with preserved ejection fraction (HCC) 03/13/2021   Stenosis of left subclavian artery (HCC) 11/18/2020   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 06/20/2019   Vertigo, benign paroxysmal, bilateral 04/04/2018   Rheumatic heart disease 05/22/2017   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  Hyperlipidemia 08/16/2013   Osteopenia 08/16/2013   Type II diabetes mellitus with manifestations (HCC) 08/16/2013   Bilateral carotid artery disease (HCC) 08/15/2012   Atypical lobular hyperplasia of breast 10/08/2011   Coronary artery disease involving native coronary artery of native heart with angina pectoris (HCC) 04/18/2010   Essential hypertension 03/26/2010   Valvular heart disease s/p AVR in 2012 02/07/2010   GERD 02/07/2010    Past Surgical History:  Procedure Laterality Date   ABDOMINAL HYSTERECTOMY     APPENDECTOMY  1973   BACK SURGERY     Lumbar Lam and discectomy x 2   BREAST BIOPSY Right 10/2011   BREAST LUMPECTOMY Right    CARDIAC CATHETERIZATION  2012   CARDIAC VALVE REPLACEMENT  2012   Aortic   CORONARY ARTERY  BYPASS GRAFT  2012   DILATION AND CURETTAGE OF UTERUS     ENDOMETRIAL ABLATION     INTRAOPERATIVE TRANSTHORACIC ECHOCARDIOGRAM N/A 01/05/2023   Procedure: INTRAOPERATIVE TRANSTHORACIC ECHOCARDIOGRAM;  Surgeon: Tonny Bollman, MD;  Location: [facility] INVASIVE CV LAB;  Service: Cardiovascular;  Laterality: N/A;   LAPAROSCOPIC LYSIS INTESTINAL ADHESIONS     LUMBAR DISC SURGERY     DR. Deneen Harts- [place]   PACEMAKER IMPLANT N/A 01/17/2020   Procedure: PACEMAKER IMPLANT;  Surgeon: Regan Lemming, MD;  Location: [facility] INVASIVE CV LAB;  Service: Cardiovascular;  Laterality: N/A;   RIGHT/LEFT HEART CATH AND CORONARY/GRAFT ANGIOGRAPHY N/A 04/07/2021   Procedure: RIGHT/LEFT HEART CATH AND CORONARY/GRAFT ANGIOGRAPHY;  Surgeon: Yvonne Kendall, MD;  Location: [facility] INVASIVE CV LAB;  Service: Cardiovascular;  Laterality: N/A;   RIGHT/LEFT HEART CATH AND CORONARY/GRAFT ANGIOGRAPHY N/A 11/26/2022   Procedure: RIGHT/LEFT HEART CATH AND CORONARY/GRAFT ANGIOGRAPHY;  Surgeon: Tonny Bollman, MD;  Location: [facility] INVASIVE CV LAB;  Service: Cardiovascular;  Laterality: N/A;   SKIN SURGERY     skin sarcomas removed   TONSILLECTOMY     1955   TONSILLECTOMY      OB History   No obstetric history on file.      Home Medications    Prior to Admission medications   Medication Sig Start Date End Date Taking? Authorizing Provider  predniSONE (DELTASONE) 20 MG tablet Take 2 tablets (40 mg total) by mouth daily with breakfast for 5 days. 04/22/23 04/27/23 Yes Laverda Stribling A, FNP  acetaminophen (TYLENOL) 325 MG tablet Take 1-2 tablets (325-650 mg total) by mouth every 4 (four) hours as needed for mild pain. 01/18/20   Graciella Freer, PA-C  amLODipine (NORVASC) 2.5 MG tablet Take 1 tablet (2.5 mg total) by mouth daily. 04/15/23   Janetta Hora, PA-C  aspirin EC 81 MG tablet Take 1 tablet (81 mg total) by mouth daily. Swallow whole. 07/03/20   Tereso Newcomer T, PA-C  azelastine (ASTELIN) 0.1 % nasal spray Place 2  sprays into both nostrils 2 (two) times daily. Use in each nostril as directed 12/03/22   [name], MD  carboxymethylcellulose (REFRESH TEARS) 0.5 % SOLN Place 1 drop into both eyes 3 (three) times daily as needed (Burning eyes).    [provider]  cetirizine (ZYRTEC) 10 MG tablet Take 10 mg by mouth every morning. Alternate every 4 weeks with loratadine    [provider]  Cholecalciferol 2000 units TABS Take 1 tablet (2,000 Units total) by mouth daily. 02/17/17   [name], MD  clopidogrel (PLAVIX) 75 MG tablet Take 1 tablet (75 mg total) by mouth daily with breakfast. 01/07/23   Janetta Hora, PA-C  docusate sodium (COLACE) 100 MG capsule Take 1 capsule (100 mg total) by mouth daily. 01/18/23   Carlisle Beers, FNP  ezetimibe (ZETIA) 10 MG tablet Take 1 tablet (10 mg total) by mouth daily. 04/15/23   Janetta Hora, PA-C  fluorouracil (EFUDEX) 5 % cream Apply topically 2 (two) times daily. START ON JANUARY 1st, 2025 12/21/22   Terri Piedra, DO  fluticasone (FLONASE) 50 MCG/ACT nasal spray USE 1 SPRAY IN EACH NOSTRIL EVERY DAY Patient taking differently: Place 1 spray into both nostrils daily as needed for allergies or rhinitis. 11/10/21   [name], MD  furosemide (LASIX) 20 MG tablet Take 1 tablet (20 mg total) by mouth daily. 10/15/22 01/13/23  Tereso Newcomer T, PA-C  hydrocortisone 2.5 % cream Apply topically 2 (two) times daily. 12/21/22   Terri Piedra, DO  irbesartan (AVAPRO) 150 MG tablet Take 0.5 tablets (75 mg total) by mouth in the morning and at bedtime. 06/02/22   Tereso Newcomer T, PA-C  isosorbide mononitrate (IMDUR) 30 MG 24 hr tablet Take 1 tablet (30 mg total) by mouth daily. 04/15/23   Janetta Hora, PA-C  loratadine (CLARITIN) 10 MG tablet Take 10 mg by mouth daily as needed for allergies. Alternate with Zyrtec every 4 weeks    [provider]  meclizine (ANTIVERT) 25 MG tablet Take 25 mg by mouth 3 (three)  times daily as needed for dizziness. OTC    [provider]  montelukast (SINGULAIR) 10 MG tablet Take 1 tablet (10 mg total) by mouth at bedtime. 12/03/22   [name], MD  nitroGLYCERIN (NITROSTAT) 0.4 MG SL tablet Place 1 tablet (0.4 mg total) under the tongue every 5 (five) minutes as needed for chest pain. 06/02/22 04/14/23  Tereso Newcomer T, PA-C  pantoprazole (PROTONIX) 40 MG tablet Take 1 tablet (40 mg total) by mouth daily. 01/06/23 01/06/24  Janetta Hora, PA-C  simvastatin (ZOCOR) 40 MG tablet Take 1 tablet (40 mg total) by mouth daily. 04/15/23   Janetta Hora, PA-C    Family History Family History  Problem Relation Age of Onset   Hypertension Mother    Breast cancer Mother    Hypothyroidism Mother    Diabetes type II Mother    Heart disease Mother    Heart attack Father        CABG @ 17   CAD Father    Breast cancer Sister        x 2   Colon cancer Maternal Aunt    Breast cancer Maternal Aunt    Colon cancer Paternal Uncle    Pancreatic cancer Other        PGA   Breast cancer Paternal Grandmother    Stomach cancer Neg Hx    Throat cancer Neg Hx    Esophageal cancer Neg Hx     Social History Social History   Tobacco Use   Smoking status: Former    Current packs/day: 0.00    Average packs/day: 1 pack/day for 40.0 years (40.0 ttl pk-yrs)    Types: Cigarettes    Start date: 02/03/1960    Quit date: 02/03/2000    Years since quitting: 23.2    Passive exposure: Never   Smokeless tobacco: Former    Types: Chew   Tobacco comments:    discussed LDCT   Vaping Use   Vaping status: Never Used  Substance Use Topics   Alcohol use: No    Alcohol/week: 0.0 standard drinks  of alcohol   Drug use: No     Allergies   Crestor [rosuvastatin], Lipitor [atorvastatin], Sulfamethoxazole-trimethoprim, Tape, Amoxicillin, Neomycin, Neosporin [neomycin-bacitracin zn-polymyx], and Polysporin [bacitracin-polymyxin b]   Review of Systems Review of Systems   Skin:  Positive for rash.     Physical Exam Triage Vital Signs ED Triage Vitals [04/22/23 0859]  Encounter Vitals Group     BP (!) 161/84     Systolic BP Percentile      Diastolic BP Percentile      Pulse Rate 72     Resp 20     Temp 98.1 F (36.7 C)     Temp Source Oral     SpO2 97 %     Weight      Height      Head Circumference      Peak Flow      Pain Score      Pain Loc      Pain Education      Exclude from Growth Chart    No data found.  Updated Vital Signs BP (!) 161/84 (BP Location: Left Arm)   Pulse 72   Temp 98.1 F (36.7 C) (Oral)   Resp 20   SpO2 97%   Visual Acuity Right Eye Distance:   Left Eye Distance:   Bilateral Distance:    Right Eye Near:   Left Eye Near:    Bilateral Near:     Physical Exam Vitals and nursing note reviewed.  Constitutional:      General: She is not in acute distress. HENT:     Nose: Nose normal.     Mouth/Throat:     Pharynx: Oropharynx is clear.  Eyes:     Conjunctiva/sclera: Conjunctivae normal.  Cardiovascular:     Rate and Rhythm: Normal rate and regular rhythm.     Heart sounds: Normal heart sounds.  Pulmonary:     Effort: Pulmonary effort is normal.     Breath sounds: Normal breath sounds.  Musculoskeletal:     Cervical back: Normal range of motion.  Skin:    Findings: Rash present.     Comments: Maculopapular rash generalized to face, neck, back and upper chest area.  No rash on limbs  Neurological:     Mental Status: She is alert.      UC Treatments / Results  Labs (all labs ordered are listed, but only abnormal results are displayed) Labs Reviewed - No data to display  EKG   Radiology No results found.  Procedures Procedures (including critical care time)  Medications Ordered in UC Medications  dexamethasone (DECADRON) injection 10 mg (10 mg Intramuscular Given 04/22/23 0911)  famotidine (PEPCID) tablet 20 mg (20 mg Oral Given 04/22/23 0929)    Initial Impression / Assessment and  Plan / UC Course  I have reviewed the triage vital signs and the nursing notes.  Pertinent labs & imaging results that were available during my care of the patient were reviewed by me and considered in my medical decision making (see chart for details).     Rash-most likely allergic reaction versus potential outbreak of poison ivy.  I would discontinue the sling and the Celebrex. Dexamethasone injection given here for allergic potential allergic reaction. Pepcid given also.  She can take Benadryl at home. Recommend if the rash is worse or not improving at all she can start the prednisone tomorrow otherwise she may not need the prednisone. Follow-up for any continued or worsening issues Final Clinical Impressions(s) /  UC Diagnoses   Final diagnoses:  Rash and nonspecific skin eruption     Discharge Instructions      The rash is most likely due to allergic reaction or potential outbreak from poison ivy. I would not use the sling anymore nor the Celebrex We have given you a steroid injection here and Pepcid for the allergic reaction.  You can take Benadryl at home. Start the prednisone tomorrow if you are not seeing any improvement. If getting better you may not need the prednisone.        ED Prescriptions     Medication Sig Dispense Auth. Provider   predniSONE (DELTASONE) 20 MG tablet Take 2 tablets (40 mg total) by mouth daily with breakfast for 5 days. 10 tablet Janace Aris, FNP      PDMP not reviewed this encounter.   Janace Aris, FNP 04/22/23 [phone]

## 2023-04-22 NOTE — Discharge Instructions (Addendum)
 The rash is most likely due to allergic reaction or potential outbreak from poison ivy. I would not use the sling anymore nor the Celebrex We have given you a steroid injection here and Pepcid for the allergic reaction.  You can take Benadryl at home. Start the prednisone tomorrow if you are not seeing any improvement. If getting better you may not need the prednisone.

## 2023-04-22 NOTE — ED Notes (Signed)
 Rash remains. Patient states itching is slightly better. Famotidine administered.

## 2023-04-22 NOTE — ED Triage Notes (Signed)
 Believes she is having an allergic reaction to something. States rash onset yesterday. Speaking and breathing easily. Rash is on face, neck, and back. No rash to arms or legs. States rash burns.

## 2023-04-23 ENCOUNTER — Other Ambulatory Visit (HOSPITAL_COMMUNITY): Payer: Self-pay

## 2023-04-29 ENCOUNTER — Ambulatory Visit (HOSPITAL_BASED_OUTPATIENT_CLINIC_OR_DEPARTMENT_OTHER)
Admit: 2023-04-29 | Discharge: 2023-04-29 | Disposition: A | Discharge status: Home / Self Care | Attending: Family Medicine | Admitting: Radiology

## 2023-04-29 ENCOUNTER — Other Ambulatory Visit (HOSPITAL_BASED_OUTPATIENT_CLINIC_OR_DEPARTMENT_OTHER): Payer: Self-pay

## 2023-04-29 ENCOUNTER — Ambulatory Visit (HOSPITAL_BASED_OUTPATIENT_CLINIC_OR_DEPARTMENT_OTHER)
Admission: EM | Admit: 2023-04-29 | Discharge: 2023-04-29 | Disposition: A | Discharge status: Home / Self Care | Attending: Family Medicine | Admitting: Family Medicine

## 2023-04-29 ENCOUNTER — Encounter (HOSPITAL_BASED_OUTPATIENT_CLINIC_OR_DEPARTMENT_OTHER): Payer: Self-pay | Admitting: Emergency Medicine

## 2023-04-29 DIAGNOSIS — R059 Cough, unspecified: Secondary | ICD-10-CM

## 2023-04-29 DIAGNOSIS — J208 Acute bronchitis due to other specified organisms: Secondary | ICD-10-CM

## 2023-04-29 DIAGNOSIS — R051 Acute cough: Secondary | ICD-10-CM | POA: Diagnosis not present

## 2023-04-29 DIAGNOSIS — I700 Atherosclerosis of aorta: Secondary | ICD-10-CM | POA: Diagnosis not present

## 2023-04-29 LAB — POC COVID19/FLU A&B COMBO
Covid Antigen, POC: NEGATIVE
Influenza A Antigen, POC: NEGATIVE
Influenza B Antigen, POC: NEGATIVE

## 2023-04-29 MED ORDER — PREDNISONE 10 MG PO TABS
ORAL_TABLET | ORAL | 0 refills | Status: AC
  Filled 2023-04-29: qty 9, 6d supply, fill #0

## 2023-04-29 MED ORDER — PROMETHAZINE-DM 6.25-15 MG/5ML PO SYRP
5.0000 mL | ORAL_SOLUTION | Freq: Four times a day (QID) | ORAL | 0 refills | Status: DC | PRN
  Filled 2023-04-29: qty 118, 6d supply, fill #0

## 2023-04-29 MED ORDER — ALBUTEROL SULFATE HFA 108 (90 BASE) MCG/ACT IN AERS
2.0000 | INHALATION_SPRAY | RESPIRATORY_TRACT | 0 refills | Status: AC | PRN
  Filled 2023-04-29: qty 6.7, 16d supply, fill #0

## 2023-04-29 MED ORDER — IPRATROPIUM-ALBUTEROL 0.5-2.5 (3) MG/3ML IN SOLN
3.0000 mL | Freq: Once | RESPIRATORY_TRACT | Status: AC
  Administered 2023-04-29: 3 mL via RESPIRATORY_TRACT

## 2023-04-29 MED ORDER — COMPACT SPACE CHAMBER DEVI
0 refills | Status: DC
  Filled 2023-04-29: qty 1, 30d supply, fill #0

## 2023-04-29 NOTE — ED Provider Notes (Signed)
 [facility]    CSN: [id number] Arrival date & time: 04/29/23  0847      History   Chief Complaint No chief complaint on file.   HPI Heather Crawford is a 78 y.o. female.   Patient reports that her granddaughter's 2 children were sick with some kind of viral illness and after several days the doctor said they were not contagious.  She took care of them last week after 4 days of them being sick and they were some better when they came to stay with her but by Friday, 04/23/2023, the patient had a cough and congestion.  Since Friday, 04/23/2023, she has developed head and chest congestion, sinus pressure, cough and wheezing.  She denies fever, nausea, vomiting, diarrhea, constipation.  She does feel fatigued and weak.  Patient was seen on 04/14/2023 for an acute shoulder bursitis and treated with Celebrex.  On 04/22/2023 she was treated for either poison ivy or reaction to Celebrex.  On 04/22/2023 she was treated with prednisone 40 mg daily for 5 days.  She just completed the prednisone on 04/27/2023.  She is no longer taking Celebrex and has that listed as an allergy now     Past Medical History:  Diagnosis Date   Bilateral carotid artery disease (HCC) 08/15/2012   Carotid US 11/2021: Bilat ICA 1-39   CAD (coronary artery disease)    a. s/p CABG 4/12: RIMA-RCA   Carotid artery disease (HCC)    Carotid US 10/21: Bilateral ICA 1-39; left subclavian stenosis   CHF (congestive heart failure) (HCC)    chronic diastolic CHF   Chronic back pain    H/O BACK SURGERY   Coronary artery disease    adenosine myoview 2/12 w/EF 56%, ISCHEMIA W/SCAR IN THE MID TO APICAL ANTERIO WALL, SEPTAL WALL, AND APEX. LHC 3/12 W/EF 55%, 40% OSTIAL RCA, 60% MID RCA, 95% DISTAL RC A, INTERVENTION  ATTEMPTED BUT UNABLE TO ADEQUADATELY SEAT  CATHETER.   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 Excell Seltzer and [name]. The surgical valve was fractured.   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   UARS (upper airway resistance syndrome) 07/06/2022   Upper airway cough syndrome 07/06/2022   Pacemaker 10/06/2021   Stage 3a chronic kidney disease (HCC) 07/03/2021   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   (HFpEF) heart  failure with preserved ejection fraction (HCC) 03/13/2021   Stenosis of left subclavian artery (HCC) 11/18/2020   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 06/20/2019   Vertigo, benign paroxysmal, bilateral 04/04/2018   Rheumatic heart disease 05/22/2017   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   Hyperlipidemia 08/16/2013   Osteopenia 08/16/2013   Type II diabetes mellitus with manifestations (HCC) 08/16/2013   Bilateral carotid artery disease (HCC) 08/15/2012   Atypical lobular hyperplasia of breast 10/08/2011   Coronary artery disease involving native coronary artery of native heart with angina pectoris (HCC) 04/18/2010   Essential hypertension 03/26/2010   Valvular heart disease s/p AVR in 2012 02/07/2010   GERD 02/07/2010    Past  Surgical History:  Procedure Laterality Date   ABDOMINAL HYSTERECTOMY     APPENDECTOMY  1973   BACK SURGERY     Lumbar Lam and discectomy x 2   BREAST BIOPSY Right 10/2011   BREAST LUMPECTOMY Right    CARDIAC CATHETERIZATION  2012   CARDIAC VALVE REPLACEMENT  2012   Aortic   CORONARY ARTERY BYPASS GRAFT  2012   DILATION AND CURETTAGE OF UTERUS     ENDOMETRIAL ABLATION     INTRAOPERATIVE TRANSTHORACIC ECHOCARDIOGRAM N/A 01/05/2023   Procedure: INTRAOPERATIVE TRANSTHORACIC ECHOCARDIOGRAM;  Surgeon: Tonny Bollman, MD;  Location: [facility] INVASIVE CV LAB;  Service: Cardiovascular;  Laterality: N/A;   LAPAROSCOPIC LYSIS INTESTINAL ADHESIONS     LUMBAR DISC SURGERY     DR. Deneen Harts- [place]   PACEMAKER IMPLANT N/A 01/17/2020   Procedure: PACEMAKER IMPLANT;  Surgeon: Regan Lemming, MD;  Location: [facility] INVASIVE CV LAB;  Service: Cardiovascular;  Laterality: N/A;   RIGHT/LEFT HEART CATH AND CORONARY/GRAFT ANGIOGRAPHY N/A 04/07/2021   Procedure: RIGHT/LEFT HEART CATH AND CORONARY/GRAFT ANGIOGRAPHY;  Surgeon: Yvonne Kendall, MD;  Location: [facility] INVASIVE CV LAB;  Service: Cardiovascular;  Laterality: N/A;   RIGHT/LEFT HEART CATH AND CORONARY/GRAFT ANGIOGRAPHY N/A 11/26/2022   Procedure: RIGHT/LEFT HEART CATH AND CORONARY/GRAFT ANGIOGRAPHY;  Surgeon: Tonny Bollman, MD;  Location: [facility] INVASIVE CV LAB;  Service: Cardiovascular;  Laterality: N/A;   SKIN SURGERY     skin sarcomas removed   TONSILLECTOMY     1955   TONSILLECTOMY      OB History   No obstetric history on file.      Home Medications    Prior to Admission medications   Medication Sig Start Date End Date Taking? Authorizing Provider  albuterol (VENTOLIN HFA) 108 (90 Base) MCG/ACT inhaler Inhale 2 puffs into the lungs every 4 (four) hours as needed for wheezing or shortness of breath. 04/29/23  Yes Prescilla Sours, FNP  amLODipine (NORVASC) 2.5 MG tablet Take 1 tablet (2.5 mg total) by mouth daily. 04/15/23  Yes Janetta Hora,  PA-C  azelastine (ASTELIN) 0.1 % nasal spray Place 2 sprays into both nostrils 2 (two) times daily. Use in each nostril as directed 12/03/22  Yes [name], MD  clopidogrel (PLAVIX) 75 MG tablet Take 1 tablet (75 mg total) by mouth daily with breakfast. 01/07/23  Yes Janetta Hora, PA-C  ezetimibe (ZETIA) 10 MG tablet Take 1 tablet (10 mg total) by mouth daily. 04/15/23  Yes Janetta Hora, PA-C  irbesartan (AVAPRO) 150 MG tablet Take 0.5 tablets (75 mg total) by mouth in the morning and at bedtime. 06/02/22  Yes Weaver, Scott T, PA-C  isosorbide mononitrate (IMDUR) 30 MG 24 hr tablet Take 1 tablet (30 mg total) by mouth daily. 04/15/23  Yes Janee Morn,  Wille Celeste, PA-C  montelukast (SINGULAIR) 10 MG tablet Take 1 tablet (10 mg total) by mouth at bedtime. 12/03/22  Yes [name], MD  pantoprazole (PROTONIX) 40 MG tablet Take 1 tablet (40 mg total) by mouth daily. 01/06/23 01/06/24 Yes Janetta Hora, PA-C  predniSONE (DELTASONE) 10 MG tablet Take 2 tablets (20 mg total) by mouth in the morning for 3 days, THEN 1 tablet (10 mg total) in the morning for 3 days then stop 04/29/23 05/05/23 Yes Prescilla Sours, FNP  promethazine-dextromethorphan (PROMETHAZINE-DM) 6.25-15 MG/5ML syrup Take 5 mLs by mouth 4 (four) times daily as needed for cough. Do not use and drive - May make drowsy. 04/29/23  Yes Prescilla Sours, FNP  simvastatin (ZOCOR) 40 MG tablet Take 1 tablet (40 mg total) by mouth daily. 04/15/23  Yes Janetta Hora, PA-C  Spacer/Aero-Holding Chambers (COMPACT SPACE CHAMBER) DEVI Use with the albuterol inhaler 04/29/23  Yes Prescilla Sours, FNP  acetaminophen (TYLENOL) 325 MG tablet Take 1-2 tablets (325-650 mg total) by mouth every 4 (four) hours as needed for mild pain. 01/18/20   Graciella Freer, PA-C  aspirin EC 81 MG tablet Take 1 tablet (81 mg total) by mouth daily. Swallow whole. 07/03/20   Tereso Newcomer T, PA-C  carboxymethylcellulose (REFRESH TEARS) 0.5 % SOLN Place 1 drop  into both eyes 3 (three) times daily as needed (Burning eyes).    [provider]  cetirizine (ZYRTEC) 10 MG tablet Take 10 mg by mouth every morning. Alternate every 4 weeks with loratadine    [provider]  Cholecalciferol 2000 units TABS Take 1 tablet (2,000 Units total) by mouth daily. 02/17/17   [name], MD  docusate sodium (COLACE) 100 MG capsule Take 1 capsule (100 mg total) by mouth daily. 01/18/23   Carlisle Beers, FNP  fluorouracil (EFUDEX) 5 % cream Apply topically 2 (two) times daily. START ON JANUARY 1st, 2025 12/21/22   Terri Piedra, DO  fluticasone (FLONASE) 50 MCG/ACT nasal spray USE 1 SPRAY IN EACH NOSTRIL EVERY DAY Patient taking differently: Place 1 spray into both nostrils daily as needed for allergies or rhinitis. 11/10/21   [name], MD  furosemide (LASIX) 20 MG tablet Take 1 tablet (20 mg total) by mouth daily. 10/15/22 01/13/23  Tereso Newcomer T, PA-C  hydrocortisone 2.5 % cream Apply topically 2 (two) times daily. 12/21/22   Terri Piedra, DO  loratadine (CLARITIN) 10 MG tablet Take 10 mg by mouth daily as needed for allergies. Alternate with Zyrtec every 4 weeks    [provider]  meclizine (ANTIVERT) 25 MG tablet Take 25 mg by mouth 3 (three) times daily as needed for dizziness. OTC    [provider]  nitroGLYCERIN (NITROSTAT) 0.4 MG SL tablet Place 1 tablet (0.4 mg total) under the tongue every 5 (five) minutes as needed for chest pain. 06/02/22 04/14/23  Beatrice Lecher, PA-C    Family History Family History  Problem Relation Age of Onset   Hypertension Mother    Breast cancer Mother    Hypothyroidism Mother    Diabetes type II Mother    Heart disease Mother    Heart attack Father        CABG @ 16   CAD Father    Breast cancer Sister        x 2   Colon cancer Maternal Aunt    Breast cancer Maternal Aunt    Colon cancer Paternal Uncle  Pancreatic cancer Other        PGA   Breast cancer  Paternal Grandmother    Stomach cancer Neg Hx    Throat cancer Neg Hx    Esophageal cancer Neg Hx     Social History Social History   Tobacco Use   Smoking status: Former    Current packs/day: 0.00    Average packs/day: 1 pack/day for 40.0 years (40.0 ttl pk-yrs)    Types: Cigarettes    Start date: 02/03/1960    Quit date: 02/03/2000    Years since quitting: 23.2    Passive exposure: Never   Smokeless tobacco: Former    Types: Chew   Tobacco comments:    discussed LDCT   Vaping Use   Vaping status: Never Used  Substance Use Topics   Alcohol use: No    Alcohol/week: 0.0 standard drinks of alcohol   Drug use: No     Allergies   Crestor [rosuvastatin], Lipitor [atorvastatin], Sulfamethoxazole-trimethoprim, Tape, Amoxicillin, Celebrex [celecoxib], Neomycin, Neosporin [neomycin-bacitracin zn-polymyx], and Polysporin [bacitracin-polymyxin b]   Review of Systems Review of Systems  Constitutional:  Negative for chills and fever.  HENT:  Positive for sinus pressure and sinus pain. Negative for ear pain and sore throat.   Eyes:  Negative for pain and visual disturbance.  Respiratory:  Positive for cough, chest tightness and wheezing. Negative for shortness of breath.   Cardiovascular:  Negative for chest pain and palpitations.  Gastrointestinal:  Negative for abdominal pain, constipation, diarrhea, nausea and vomiting.  Genitourinary:  Negative for dysuria and hematuria.  Musculoskeletal:  Negative for arthralgias and back pain.  Skin:  Negative for color change and rash.  Neurological:  Negative for seizures and syncope.  All other systems reviewed and are negative.    Physical Exam Triage Vital Signs ED Triage Vitals  Encounter Vitals Group     BP 04/29/23 0903 (!) 142/80     Systolic BP Percentile --      Diastolic BP Percentile --      Pulse Rate 04/29/23 0903 66     Resp 04/29/23 0903 (!) 22     Temp 04/29/23 0903 98 F (36.7 C)     Temp Source 04/29/23 0903 Oral      SpO2 04/29/23 0903 96 %     Weight --      Height --      Head Circumference --      Peak Flow --      Pain Score 04/29/23 0902 3     Pain Loc --      Pain Education --      Exclude from Growth Chart --    No data found.  Updated Vital Signs BP (!) 142/80 (BP Location: Right Arm)   Pulse 66   Temp 98 F (36.7 C) (Oral)   Resp (!) 22   SpO2 96%   Visual Acuity Right Eye Distance:   Left Eye Distance:   Bilateral Distance:    Right Eye Near:   Left Eye Near:    Bilateral Near:     Physical Exam Vitals and nursing note reviewed.  Constitutional:      General: She is not in acute distress.    Appearance: She is well-developed. She is ill-appearing. She is not toxic-appearing.  HENT:     Head: Normocephalic and atraumatic.     Right Ear: Hearing, tympanic membrane, ear canal and external ear normal.     Left Ear: Hearing, tympanic membrane,  ear canal and external ear normal.     Nose: Congestion and rhinorrhea present. Rhinorrhea is clear.     Right Sinus: Maxillary sinus tenderness present. No frontal sinus tenderness.     Left Sinus: Maxillary sinus tenderness present. No frontal sinus tenderness.     Mouth/Throat:     Lips: Pink.     Mouth: Mucous membranes are moist.     Pharynx: Uvula midline. No oropharyngeal exudate or posterior oropharyngeal erythema.     Tonsils: No tonsillar exudate.  Eyes:     Conjunctiva/sclera: Conjunctivae normal.     Pupils: Pupils are equal, round, and reactive to light.  Cardiovascular:     Rate and Rhythm: Normal rate and regular rhythm.     Heart sounds: S1 normal and S2 normal. No murmur heard. Pulmonary:     Effort: Pulmonary effort is normal. No respiratory distress.     Breath sounds: Examination of the right-upper field reveals wheezing and rhonchi. Examination of the left-upper field reveals wheezing and rhonchi. Examination of the right-middle field reveals wheezing. Examination of the left-middle field reveals wheezing.  Examination of the right-lower field reveals decreased breath sounds and wheezing. Examination of the left-lower field reveals decreased breath sounds and wheezing. Decreased breath sounds, wheezing and rhonchi present. No rales.     Comments: Reassessment after DuoNeb treatment: Breath sounds are improved but she still has some wheezing on the left and some rhonchi.  Oxygen saturation was 96% on room air prior to the treatment and oxygen saturation is 98% on room air after the treatment. Abdominal:     General: Bowel sounds are normal.     Palpations: Abdomen is soft.     Tenderness: There is no abdominal tenderness.  Musculoskeletal:        General: No swelling.     Cervical back: Neck supple.  Lymphadenopathy:     Head:     Right side of head: No submental, submandibular, tonsillar, preauricular or posterior auricular adenopathy.     Left side of head: No submental, submandibular, tonsillar, preauricular or posterior auricular adenopathy.     Cervical: No cervical adenopathy.     Right cervical: No superficial cervical adenopathy.    Left cervical: No superficial cervical adenopathy.  Skin:    General: Skin is warm and dry.     Capillary Refill: Capillary refill takes less than 2 seconds.     Findings: No rash.  Neurological:     Mental Status: She is alert and oriented to person, place, and time.  Psychiatric:        Mood and Affect: Mood normal.      UC Treatments / Results  Labs (all labs ordered are listed, but only abnormal results are displayed) Labs Reviewed  POC COVID19/FLU A&B COMBO - Normal    EKG   Radiology No results found.  Procedures Procedures (including critical care time)  Medications Ordered in UC Medications  ipratropium-albuterol (DUONEB) 0.5-2.5 (3) MG/3ML nebulizer solution 3 mL (3 mLs Nebulization Given 04/29/23 0932)    Initial Impression / Assessment and Plan / UC Course  I have reviewed the triage vital signs and the nursing  notes.  Pertinent labs & imaging results that were available during my care of the patient were reviewed by me and considered in my medical decision making (see chart for details).     Chest x-ray appears negative.  Will update the patient if the radiology report differs.  Albuterol inhaler, 2 puffs every 4 hours as  needed for wheezing.  Use albuterol inhaler with a spacer.  Has had recent prednisone use but needs more I am going to give her a lower dose.  Prednisone 10 mg, 2 pills daily for 3 days then 1 pill daily for 3 days.  Promethazine DM, 5 mL every 6 hours as needed for cough.  Get plenty of fluids and rest.  Follow-up if symptoms do not improve, worsen or new symptoms occur. Final Clinical Impressions(s) / UC Diagnoses   Final diagnoses:  Acute cough  Acute viral bronchitis     Discharge Instructions      Chest x-ray appears negative.  Will update the patient if the radiology review differs.  Albuterol inhaler, 2 puffs every 4 hours as needed for wheezing.  Provided a spacer to use with the inhaler and instructions.  Patient was recently on prednisone for an acute allergic reaction to Celebrex.  Will add prednisone but 10 mg 2 pills daily for 3 days then 1 pill daily for 3 days.  Use Promethazine DM, 5 mL, every 6 hours as needed for cough.  Get plenty of fluids and rest.  Follow-up if symptoms do not improve, worsen or new symptoms occur.     ED Prescriptions     Medication Sig Dispense Auth. Provider   predniSONE (DELTASONE) 10 MG tablet Take 2 tablets (20 mg total) by mouth in the morning for 3 days, THEN 1 tablet (10 mg total) in the morning for 3 days then stop 9 tablet Prescilla Sours, FNP   albuterol (VENTOLIN HFA) 108 (90 Base) MCG/ACT inhaler Inhale 2 puffs into the lungs every 4 (four) hours as needed for wheezing or shortness of breath. 6.7 g Prescilla Sours, FNP   Spacer/Aero-Holding Chambers (COMPACT SPACE CHAMBER) DEVI Use with the albuterol inhaler 1 each Prescilla Sours, FNP   promethazine-dextromethorphan (PROMETHAZINE-DM) 6.25-15 MG/5ML syrup Take 5 mLs by mouth 4 (four) times daily as needed for cough. Do not use and drive - May make drowsy. 118 mL Prescilla Sours, FNP      PDMP not reviewed this encounter.   Prescilla Sours, FNP 04/29/23 1024

## 2023-04-29 NOTE — Discharge Instructions (Addendum)
 Chest x-ray appears negative.  Will update the patient if the radiology review differs.  Albuterol inhaler, 2 puffs every 4 hours as needed for wheezing.  Provided a spacer to use with the inhaler and instructions.  Patient was recently on prednisone for an acute allergic reaction to Celebrex.  Will add prednisone but 10 mg 2 pills daily for 3 days then 1 pill daily for 3 days.  Use Promethazine DM, 5 mL, every 6 hours as needed for cough.  Get plenty of fluids and rest.  Follow-up if symptoms do not improve, worsen or new symptoms occur.

## 2023-04-29 NOTE — ED Triage Notes (Signed)
 Pt c/o sinus and chest congestion, coughing and wheezing started on Friday.

## 2023-04-29 NOTE — Progress Notes (Signed)
 Chest X-Ray IMPRESSION:   No active cardiopulmonary disease.  Patient updated.

## 2023-05-10 ENCOUNTER — Encounter (HOSPITAL_BASED_OUTPATIENT_CLINIC_OR_DEPARTMENT_OTHER): Payer: Self-pay

## 2023-05-10 ENCOUNTER — Ambulatory Visit (HOSPITAL_BASED_OUTPATIENT_CLINIC_OR_DEPARTMENT_OTHER)
Admission: EM | Admit: 2023-05-10 | Discharge: 2023-05-10 | Disposition: A | Discharge status: Home / Self Care | Attending: Family Medicine | Admitting: Family Medicine

## 2023-05-10 ENCOUNTER — Other Ambulatory Visit (HOSPITAL_BASED_OUTPATIENT_CLINIC_OR_DEPARTMENT_OTHER): Payer: Self-pay

## 2023-05-10 DIAGNOSIS — B370 Candidal stomatitis: Secondary | ICD-10-CM

## 2023-05-10 MED ORDER — NYSTATIN 100000 UNIT/ML MT SUSP
500000.0000 [IU] | Freq: Four times a day (QID) | OROMUCOSAL | 0 refills | Status: AC
  Filled 2023-05-10: qty 140, 7d supply, fill #0

## 2023-05-10 MED ORDER — NYSTATIN 100000 UNIT/ML MT SUSP
500000.0000 [IU] | Freq: Four times a day (QID) | OROMUCOSAL | 0 refills | Status: DC
  Filled 2023-05-10: qty 140, 7d supply, fill #0

## 2023-05-10 NOTE — Discharge Instructions (Signed)
 Use the mouthwash as prescribed.  Follow-up as needed Make sure after using inhalers that you rinse your mouth out.

## 2023-05-10 NOTE — ED Provider Notes (Signed)
 [facility]    CSN: [id number] Arrival date & time: 05/10/23  1020      History   Chief Complaint Chief Complaint  Patient presents with   possible thrush    HPI Heather Crawford is a 78 y.o. female.   Patient is a 78 year old female who presents today with burning and white patches on tongue since Thursday.  Started after using albuterol inhaler.  Was treated for respiratory infection.  Has been trying to do yogurt with probiotics and rinsing mouth out with warm salt water but no change.  No fever, lesions or sores treating with nystatin     Past Medical History:  Diagnosis Date   Bilateral carotid artery disease (HCC) 08/15/2012   Carotid US 11/2021: Bilat ICA 1-39   CAD (coronary artery disease)    a. s/p CABG 4/12: RIMA-RCA   Carotid artery disease (HCC)    Carotid US 10/21: Bilateral ICA 1-39; left subclavian stenosis   CHF (congestive heart failure) (HCC)    chronic diastolic CHF   Chronic back pain    H/O BACK SURGERY   Coronary artery disease    adenosine myoview 2/12 w/EF 56%, ISCHEMIA W/SCAR IN THE MID TO APICAL ANTERIO WALL, SEPTAL WALL, AND APEX. LHC 3/12 W/EF 55%, 40% OSTIAL RCA, 60% MID RCA, 95% DISTAL RC A, INTERVENTION  ATTEMPTED BUT UNABLE TO ADEQUADATELY SEAT  CATHETER.   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 Excell Seltzer and [name]. The surgical valve was fractured.   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   UARS (upper airway resistance syndrome) 07/06/2022   Upper airway cough syndrome 07/06/2022   Pacemaker 10/06/2021   Stage 3a chronic kidney disease (HCC) 07/03/2021   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   (HFpEF) heart failure with preserved ejection fraction (HCC) 03/13/2021   Stenosis of left subclavian artery (HCC) 11/18/2020   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 06/20/2019   Vertigo, benign paroxysmal, bilateral 04/04/2018   Rheumatic heart disease 05/22/2017   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   Hyperlipidemia 08/16/2013   Osteopenia 08/16/2013   Type II diabetes mellitus with manifestations (HCC) 08/16/2013   Bilateral carotid artery disease (HCC) 08/15/2012   Atypical lobular hyperplasia of breast 10/08/2011   Coronary artery disease involving native coronary artery of native heart with angina  pectoris (HCC) 04/18/2010   Essential hypertension 03/26/2010   Valvular heart disease s/p AVR in 2012 02/07/2010   GERD 02/07/2010    Past Surgical History:  Procedure Laterality Date   ABDOMINAL HYSTERECTOMY     APPENDECTOMY  1973   BACK SURGERY     Lumbar Lam and discectomy x 2   BREAST BIOPSY Right 10/2011   BREAST LUMPECTOMY Right    CARDIAC CATHETERIZATION  2012   CARDIAC VALVE REPLACEMENT  2012   Aortic   CORONARY ARTERY BYPASS GRAFT  2012   DILATION AND CURETTAGE OF UTERUS     ENDOMETRIAL ABLATION     INTRAOPERATIVE TRANSTHORACIC ECHOCARDIOGRAM N/A 01/05/2023   Procedure: INTRAOPERATIVE TRANSTHORACIC ECHOCARDIOGRAM;  Surgeon: Tonny Bollman,  MD;  Location: [facility] INVASIVE CV LAB;  Service: Cardiovascular;  Laterality: N/A;   LAPAROSCOPIC LYSIS INTESTINAL ADHESIONS     LUMBAR DISC SURGERY     DR. Deneen Harts- [place]   PACEMAKER IMPLANT N/A 01/17/2020   Procedure: PACEMAKER IMPLANT;  Surgeon: Regan Lemming, MD;  Location: [facility] INVASIVE CV LAB;  Service: Cardiovascular;  Laterality: N/A;   RIGHT/LEFT HEART CATH AND CORONARY/GRAFT ANGIOGRAPHY N/A 04/07/2021   Procedure: RIGHT/LEFT HEART CATH AND CORONARY/GRAFT ANGIOGRAPHY;  Surgeon: Yvonne Kendall, MD;  Location: [facility] INVASIVE CV LAB;  Service: Cardiovascular;  Laterality: N/A;   RIGHT/LEFT HEART CATH AND CORONARY/GRAFT ANGIOGRAPHY N/A 11/26/2022   Procedure: RIGHT/LEFT HEART CATH AND CORONARY/GRAFT ANGIOGRAPHY;  Surgeon: Tonny Bollman, MD;  Location: [facility] INVAS[facility] CV LAB;  Service: Cardiovascular;  Laterality: N/A;   SKIN SURGERY     skin sarcomas removed   TONSILLECTOMY     1955   TONSILLECTOMY      OB History   No obstetric history on file.      Home Medications    Prior to Admission medications   Medication Sig Start Date End Date Taking? Authorizing Provider  acetaminophen (TYLENOL) 325 MG tablet Take 1-2 tablets (325-650 mg total) by mouth every 4 (four) hours as needed for mild pain. 01/18/20   Graciella Freer, PA-C  albuterol (VENTOLIN HFA) 108 (90 Base) MCG/ACT inhaler Inhale 2 puffs into the lungs every 4 (four) hours as needed for wheezing or shortness of breath. 04/29/23   Prescilla Sours, FNP  amLODipine (NORVASC) 2.5 MG tablet Take 1 tablet (2.5 mg total) by mouth daily. 04/15/23   Janetta Hora, PA-C  aspirin EC 81 MG tablet Take 1 tablet (81 mg total) by mouth daily. Swallow whole. 07/03/20   Tereso Newcomer T, PA-C  azelastine (ASTELIN) 0.1 % nasal spray Place 2 sprays into both nostrils 2 (two) times daily. Use in each nostril as directed 12/03/22   [name], MD  carboxymethylcellulose (REFRESH TEARS) 0.5 % SOLN Place 1 drop into both eyes 3  (three) times daily as needed (Burning eyes).    [provider]  cetirizine (ZYRTEC) 10 MG tablet Take 10 mg by mouth every morning. Alternate every 4 weeks with loratadine    [provider]  Cholecalciferol 2000 units TABS Take 1 tablet (2,000 Units total) by mouth daily. 02/17/17   [name], MD  clopidogrel (PLAVIX) 75 MG tablet Take 1 tablet (75 mg total) by mouth daily with breakfast. 01/07/23   Janetta Hora, PA-C  docusate sodium (COLACE) 100 MG capsule Take 1 capsule (100 mg total) by mouth daily. 01/18/23   Carlisle Beers, FNP  ezetimibe (ZETIA) 10 MG tablet Take 1 tablet (10 mg total) by mouth daily. 04/15/23  Janetta Hora, PA-C  fluorouracil (EFUDEX) 5 % cream Apply topically 2 (two) times daily. START ON JANUARY 1st, 2025 12/21/22   Terri Piedra, DO  fluticasone (FLONASE) 50 MCG/ACT nasal spray USE 1 SPRAY IN EACH NOSTRIL EVERY DAY Patient taking differently: Place 1 spray into both nostrils daily as needed for allergies or rhinitis. 11/10/21   [name], MD  furosemide (LASIX) 20 MG tablet Take 1 tablet (20 mg total) by mouth daily. 10/15/22 01/13/23  Tereso Newcomer T, PA-C  hydrocortisone 2.5 % cream Apply topically 2 (two) times daily. 12/21/22   Terri Piedra, DO  irbesartan (AVAPRO) 150 MG tablet Take 0.5 tablets (75 mg total) by mouth in the morning and at bedtime. 06/02/22   Tereso Newcomer T, PA-C  isosorbide mononitrate (IMDUR) 30 MG 24 hr tablet Take 1 tablet (30 mg total) by mouth daily. 04/15/23   Janetta Hora, PA-C  loratadine (CLARITIN) 10 MG tablet Take 10 mg by mouth daily as needed for allergies. Alternate with Zyrtec every 4 weeks    [provider]  meclizine (ANTIVERT) 25 MG tablet Take 25 mg by mouth 3 (three) times daily as needed for dizziness. OTC    [provider]  montelukast (SINGULAIR) 10 MG tablet Take 1 tablet (10 mg total) by mouth at bedtime. 12/03/22   [name], MD   nitroGLYCERIN (NITROSTAT) 0.4 MG SL tablet Place 1 tablet (0.4 mg total) under the tongue every 5 (five) minutes as needed for chest pain. 06/02/22 04/14/23  Tereso Newcomer T, PA-C  nystatin (MYCOSTATIN) 100000 UNIT/ML suspension Take 5 mLs (500,000 Units total) by mouth 4 (four) times daily for 7 days. 05/10/23 05/17/23  Dahlia Byes A, FNP  pantoprazole (PROTONIX) 40 MG tablet Take 1 tablet (40 mg total) by mouth daily. 01/06/23 01/06/24  Janetta Hora, PA-C  promethazine-dextromethorphan (PROMETHAZINE-DM) 6.25-15 MG/5ML syrup Take 5 mLs by mouth 4 (four) times daily as needed for cough. Do not use and drive - May make drowsy. 04/29/23   Prescilla Sours, FNP  simvastatin (ZOCOR) 40 MG tablet Take 1 tablet (40 mg total) by mouth daily. 04/15/23   Janetta Hora, PA-C  Spacer/Aero-Holding Chambers (COMPACT SPACE CHAMBER) DEVI Use with the albuterol inhaler 04/29/23   Prescilla Sours, FNP    Family History Family History  Problem Relation Age of Onset   Hypertension Mother    Breast cancer Mother    Hypothyroidism Mother    Diabetes type II Mother    Heart disease Mother    Heart attack Father        CABG @ 2   CAD Father    Breast cancer Sister        x 2   Colon cancer Maternal Aunt    Breast cancer Maternal Aunt    Colon cancer Paternal Uncle    Pancreatic cancer Other        PGA   Breast cancer Paternal Grandmother    Stomach cancer Neg Hx    Throat cancer Neg Hx    Esophageal cancer Neg Hx     Social History Social History   Tobacco Use   Smoking status: Former    Current packs/day: 0.00    Average packs/day: 1 pack/day for 40.0 years (40.0 ttl pk-yrs)    Types: Cigarettes    Start date: 02/03/1960    Quit date: 02/03/2000    Years since quitting: 23.2    Passive exposure: Never   Smokeless tobacco: Former  Types: Chew   Tobacco comments:    discussed LDCT   Vaping Use   Vaping status: Never Used  Substance Use Topics   Alcohol use: No    Alcohol/week: 0.0 standard  drinks of alcohol   Drug use: No     Allergies   Crestor [rosuvastatin], Lipitor [atorvastatin], Sulfamethoxazole-trimethoprim, Tape, Amoxicillin, Celebrex [celecoxib], Neomycin, Neosporin [neomycin-bacitracin zn-polymyx], and Polysporin [bacitracin-polymyxin b]   Review of Systems Review of Systems   Physical Exam Triage Vital Signs ED Triage Vitals [05/10/23 1033]  Encounter Vitals Group     BP 124/80     Systolic BP Percentile      Diastolic BP Percentile      Pulse Rate 94     Resp 20     Temp 98.1 F (36.7 C)     Temp Source Oral     SpO2 95 %     Weight      Height      Head Circumference      Peak Flow      Pain Score 2     Pain Loc      Pain Education      Exclude from Growth Chart    No data found.  Updated Vital Signs BP 124/80 (BP Location: Right Arm)   Pulse 94   Temp 98.1 F (36.7 C) (Oral)   Resp 20   SpO2 95%   Visual Acuity Right Eye Distance:   Left Eye Distance:   Bilateral Distance:    Right Eye Near:   Left Eye Near:    Bilateral Near:     Physical Exam Constitutional:      Appearance: Normal appearance.  HENT:     Mouth/Throat:     Tongue: No lesions.     Pharynx: No pharyngeal swelling, oropharyngeal exudate or posterior oropharyngeal erythema.     Comments: Tongue with white coating  Pulmonary:     Effort: Pulmonary effort is normal.  Neurological:     Mental Status: She is alert.      UC Treatments / Results  Labs (all labs ordered are listed, but only abnormal results are displayed) Labs Reviewed - No data to display  EKG   Radiology No results found.  Procedures Procedures (including critical care time)  Medications Ordered in UC Medications - No data to display  Initial Impression / Assessment and Plan / UC Course  I have reviewed the triage vital signs and the nursing notes.  Pertinent labs & imaging results that were available during my care of the patient were reviewed by me and considered in my  medical decision making (see chart for details).     Oral thrush-treating with nystatin Recommend for future to rinse mouth out when using inhalers. Final Clinical Impressions(s) / UC Diagnoses   Final diagnoses:  Oral thrush     Discharge Instructions      Use the mouthwash as prescribed.  Follow-up as needed Make sure after using inhalers that you rinse your mouth out.    ED Prescriptions     Medication Sig Dispense Auth. Provider   nystatin (MYCOSTATIN) 100000 UNIT/ML suspension  (Status: Discontinued) Take 5 mLs (500,000 Units total) by mouth 4 (four) times daily. 140 mL Beau Ramsburg A, FNP   nystatin (MYCOSTATIN) 100000 UNIT/ML suspension Take 5 mLs (500,000 Units total) by mouth 4 (four) times daily for 7 days. 140 mL Dahlia Byes A, FNP      PDMP not reviewed this encounter.  Janace Aris, FNP 05/10/23 1108

## 2023-05-10 NOTE — ED Triage Notes (Signed)
 Patient states onset of burning and white patch on tongue since Thursday. Patient has been using albuterol inhaler and has not rinsed mouth after use.

## 2023-05-13 ENCOUNTER — Other Ambulatory Visit (HOSPITAL_COMMUNITY): Payer: Self-pay

## 2023-05-20 ENCOUNTER — Telehealth (HOSPITAL_BASED_OUTPATIENT_CLINIC_OR_DEPARTMENT_OTHER): Payer: Self-pay | Admitting: Physician Assistant

## 2023-05-20 ENCOUNTER — Other Ambulatory Visit (HOSPITAL_BASED_OUTPATIENT_CLINIC_OR_DEPARTMENT_OTHER): Payer: Self-pay

## 2023-05-20 ENCOUNTER — Encounter (HOSPITAL_BASED_OUTPATIENT_CLINIC_OR_DEPARTMENT_OTHER): Payer: Self-pay

## 2023-05-20 ENCOUNTER — Other Ambulatory Visit (HOSPITAL_COMMUNITY): Payer: Self-pay

## 2023-05-20 ENCOUNTER — Telehealth: Payer: Self-pay | Admitting: Cardiovascular Disease

## 2023-05-20 ENCOUNTER — Ambulatory Visit (HOSPITAL_BASED_OUTPATIENT_CLINIC_OR_DEPARTMENT_OTHER)
Admission: EM | Admit: 2023-05-20 | Discharge: 2023-05-20 | Disposition: A | Discharge status: Home / Self Care | Attending: Physician Assistant | Admitting: Physician Assistant

## 2023-05-20 ENCOUNTER — Ambulatory Visit (HOSPITAL_BASED_OUTPATIENT_CLINIC_OR_DEPARTMENT_OTHER)
Admit: 2023-05-20 | Discharge: 2023-05-20 | Disposition: A | Discharge status: Home / Self Care | Attending: Physician Assistant | Admitting: Physician Assistant

## 2023-05-20 DIAGNOSIS — I700 Atherosclerosis of aorta: Secondary | ICD-10-CM | POA: Diagnosis not present

## 2023-05-20 DIAGNOSIS — R052 Subacute cough: Secondary | ICD-10-CM | POA: Diagnosis not present

## 2023-05-20 DIAGNOSIS — R0602 Shortness of breath: Secondary | ICD-10-CM

## 2023-05-20 DIAGNOSIS — R062 Wheezing: Secondary | ICD-10-CM | POA: Diagnosis not present

## 2023-05-20 DIAGNOSIS — Z950 Presence of cardiac pacemaker: Secondary | ICD-10-CM | POA: Diagnosis not present

## 2023-05-20 DIAGNOSIS — R059 Cough, unspecified: Secondary | ICD-10-CM

## 2023-05-20 DIAGNOSIS — J329 Chronic sinusitis, unspecified: Secondary | ICD-10-CM

## 2023-05-20 DIAGNOSIS — R918 Other nonspecific abnormal finding of lung field: Secondary | ICD-10-CM | POA: Diagnosis not present

## 2023-05-20 MED ORDER — NYSTATIN 100000 UNIT/ML MT SUSP
500000.0000 [IU] | Freq: Four times a day (QID) | OROMUCOSAL | 0 refills | Status: DC
  Filled 2023-05-20: qty 60, 3d supply, fill #0

## 2023-05-20 MED ORDER — IPRATROPIUM-ALBUTEROL 0.5-2.5 (3) MG/3ML IN SOLN
3.0000 mL | Freq: Once | RESPIRATORY_TRACT | Status: AC
  Administered 2023-05-20: 3 mL via RESPIRATORY_TRACT

## 2023-05-20 MED ORDER — CEFPODOXIME PROXETIL 200 MG PO TABS
200.0000 mg | ORAL_TABLET | Freq: Two times a day (BID) | ORAL | 0 refills | Status: DC
  Filled 2023-05-20 – 2023-05-21 (×2): qty 14, 7d supply, fill #0

## 2023-05-20 MED ORDER — DOXYCYCLINE HYCLATE 100 MG PO CAPS
100.0000 mg | ORAL_CAPSULE | Freq: Two times a day (BID) | ORAL | 0 refills | Status: DC
  Filled 2023-05-20: qty 14, 7d supply, fill #0

## 2023-05-20 MED ORDER — FLUTICASONE-SALMETEROL 100-50 MCG/ACT IN AEPB
1.0000 | INHALATION_SPRAY | Freq: Two times a day (BID) | RESPIRATORY_TRACT | 0 refills | Status: DC
  Filled 2023-05-20 (×2): qty 60, 30d supply, fill #0

## 2023-05-20 MED ORDER — BUDESONIDE-FORMOTEROL FUMARATE 80-4.5 MCG/ACT IN AERO
2.0000 | INHALATION_SPRAY | Freq: Two times a day (BID) | RESPIRATORY_TRACT | 0 refills | Status: DC
  Filled 2023-05-20: qty 1, fill #0

## 2023-05-20 NOTE — Telephone Encounter (Signed)
 Pt seen at Urgent Care today in [place] and was told to f/u with card asap due to fluid buildup. Requesting cb to further discuss

## 2023-05-20 NOTE — Telephone Encounter (Signed)
 Received radiologist over read of x-ray that showed possible pneumonia.  Patient was started on doxycycline to cover for atypical pathogens but will add cefpodoxime for outpatient coverage of CAP.  She does have a history of itching with amoxicillin in the past but denies any hypersensitivity reaction.  She is unsure if she has taken a cephalosporin but does have use of cefazolin and surgery without documented reaction.  I did offer respiratory fluoroquinolone as an alternative but after discussion of risks and benefits of this medication patient preferred to try cephalosporin.  She will monitor for any adverse reaction including itching or rash.  We discussed that given her age it is appropriate to consider inpatient treatment but she has close follow-up with cardiology tomorrow and will monitor her oxygen saturation.  She is agreeable to go to the hospital if anything worsens but would like to attempt outpatient treatment.  All questions were answered to patient satisfaction.

## 2023-05-20 NOTE — Discharge Instructions (Addendum)
 Please start doxycycline 100 mg twice daily for 7 days.  Stay out of the sun while on this medication as it caused you to have a sunburn.  I am hesitant to begin additional steroids as I am concerned that that is leading to your swelling.  Lets try Symbicort twice daily for 1 week.  Rinse your mouth following use of this medication to prevent thrush.  If you get thrush I have called in nystatin to have on hand to be used as previously prescribed.  Please take your Lasix.  As we discussed, I am concerned that it is also possible you are having some fluid issues related to your heart that are contributing to the symptoms.  I would like you to follow-up with your cardiologist as soon as possible.  Please call them to schedule an appointment when you leave here.  If you are not feeling better in 24 hours or if anything worsens overnight and you have worsening cough, shortness of breath, oxygen below 93%, fever, weakness, nausea/vomiting, chest pain you need to go to the emergency room as we discussed.

## 2023-05-20 NOTE — Telephone Encounter (Signed)
 Per Urgent Care note patient advised to see cardiology as soon as possible.  I spoke with patient and scheduled her to see Dr Albert Huff ([facility]) tomorrow.  Patient aware to go to ED if symptoms worsen prior to this appointment

## 2023-05-20 NOTE — ED Triage Notes (Signed)
 Patient presents for cough, sinus congestion. Has had multiple visits since mid March for same.  Has been on steroids, inhaler for treatment. States every time she calls her family doctor's office for follow up, she is referred back to urgent care. States has never been given antibiotics.

## 2023-05-20 NOTE — ED Provider Notes (Signed)
 [facility]    CSN: [id number] Arrival date & time: 05/20/23  1106      History   Chief Complaint Chief Complaint  Patient presents with   Cough   Sinus congestion.    HPI Heather Crawford is a 78 y.o. female.   Patient presents today with a several week history of persistent URI symptoms.  She has been seen by our clinic several times over the past month for similar symptoms.  She has been diagnosed with viral bronchitis and started on oral steroids but this has not provided any relief of symptoms.  She has also been taking over-the-counter allergy medication including Zyrtec, Flonase.  She denies any known sick contacts.  She denies any recent antibiotics in the past 90 days.  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  She denies any chest pain or palpitations.  She does have a history of congestive heart failure and is followed closely by cardiology.  She has missed several days of her Lasix but has been weighing herself daily and has not noticed a significant increase in her daily weight.  She does report some shortness of breath but attributes this primarily to wheezing.  She denies formal diagnosis of asthma or COPD.  She does not smoke; she is a former smoker but quit many years ago.  She is having difficulty with her daily activities as result of symptoms and is frustrated that she has had to keep returning to our clinic because her symptoms are not improving.  She does have a history of diabetes but has been monitoring her blood sugar closely and reports that her fasting blood sugar has not been above 125 even with steroid use.    Past Medical History:  Diagnosis Date   Bilateral carotid artery disease (HCC) 08/15/2012   Carotid US 11/2021: Bilat ICA 1-39   CAD (coronary artery disease)    a. s/p CABG 4/12: RIMA-RCA   Carotid artery disease (HCC)    Carotid US 10/21: Bilateral ICA  1-39; left subclavian stenosis   CHF (congestive heart failure) (HCC)    chronic diastolic CHF   Chronic back pain    H/O BACK SURGERY   Coronary artery disease    adenosine myoview 2/12 w/EF 56%, ISCHEMIA W/SCAR IN THE MID TO APICAL ANTERIO WALL, SEPTAL WALL, AND APEX. LHC 3/12 W/EF 55%, 40% OSTIAL RCA, 60% MID RCA, 95% DISTAL RC A, INTERVENTION  ATTEMPTED BUT UNABLE TO ADEQUADATELY SEAT  CATHETER.   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 Excell Seltzer and [name]. The surgical valve was fractured.   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   UARS (upper airway resistance syndrome) 07/06/2022   Upper airway cough syndrome 07/06/2022   Pacemaker 10/06/2021   Stage 3a chronic kidney disease (HCC) 07/03/2021   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   (HFpEF) heart failure with preserved ejection fraction (HCC) 03/13/2021   Stenosis of left subclavian artery (HCC) 11/18/2020   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 06/20/2019   Vertigo, benign  paroxysmal, bilateral 04/04/2018   Rheumatic heart disease 05/22/2017   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   Hyperlipidemia 08/16/2013   Osteopenia 08/16/2013   Type II diabetes mellitus with manifestations (HCC) 08/16/2013   Bilateral carotid artery disease (HCC) 08/15/2012   Atypical lobular hyperplasia of breast 10/08/2011   Coronary artery disease involving native coronary artery of native heart with angina pectoris (HCC) 04/18/2010   Essential hypertension 03/26/2010   Valvular heart disease s/p AVR in 2012 02/07/2010   GERD 02/07/2010    Past Surgical History:  Procedure Laterality Date   ABDOMINAL HYSTERECTOMY     APPENDECTOMY  1973   BACK SURGERY     Lumbar Lam and discectomy x 2   BREAST BIOPSY Right 10/2011   BREAST LUMPECTOMY Right    CARDIAC CATHETERIZATION  2012   CARDIAC VALVE REPLACEMENT  2012   Aortic   CORONARY ARTERY BYPASS GRAFT  2012   DILATION AND CURETTAGE OF UTERUS     ENDOMETRIAL ABLATION     INTRAOPERATIVE TRANSTHORACIC ECHOCARDIOGRAM N/A 01/05/2023   Procedure: INTRAOPERATIVE TRANSTHORACIC ECHOCARDIOGRAM;  Surgeon: Tonny Bollman, MD;  Location: [facility]ASIVE [facility];  Service: Cardiovascular;  Laterality: N/A;   LAPAROSCOPIC LYSIS INTESTINAL ADHESIONS     LUMBAR DISC SURGERY     DR. Deneen Harts- [place]   PACEMAKER IMPLANT N/A 01/17/2020   Procedure: PACEMAKER IMPLANT;  Surgeon: Regan Lemming, MD;  Location: [facility] INVASIVE CV LAB;  Service: Cardiovascular;  Laterality: N/A;   RIGHT/LEFT HEART CATH AND CORONARY/GRAFT ANGIOGRAPHY N/A 04/07/2021   Procedure: RIGHT/LEFT HEART CATH AND CORONARY/GRAFT ANGIOGRAPHY;  Surgeon: Yvonne Kendall, MD;  Location: [facility] INVASIVE CV LAB;  Service: Cardiovascular;  Laterality: N/A;   RIGHT/LEFT HEART CATH AND CORONARY/GRAFT ANGIOGRAPHY N/A 11/26/2022   Procedure: RIGHT/LEFT HEART CATH AND CORONARY/GRAFT ANGIOGRAPHY;  Surgeon: Tonny Bollman, MD;  Location: [facility] INVASIVE CV LAB;  Service: Cardiovascular;  Laterality:  N/A;   SKIN SURGERY     skin sarcomas removed   TONSILLECTOMY     1955   TONSILLECTOMY      OB History   No obstetric history on file.      Home Medications    Prior to Admission medications   Medication Sig Start Date End Date Taking? Authorizing Provider  acetaminophen (TYLENOL) 325 MG tablet Take 1-2 tablets (325-650 mg total) by mouth every 4 (four) hours as needed for mild pain. 01/18/20   Graciella Freer, PA-C  albuterol (VENTOLIN HFA) 108 (90 Base) MCG/ACT inhaler Inhale 2 puffs into the lungs every 4 (four) hours as needed for wheezing or shortness of breath. 04/29/23   Prescilla Sours, FNP  amLODipine (NORVASC) 2.5 MG tablet Take 1 tablet (2.5 mg total) by mouth daily. 04/15/23  Janetta Hora, PA-C  aspirin EC 81 MG tablet Take 1 tablet (81 mg total) by mouth daily. Swallow whole. 07/03/20   Tereso Newcomer T, PA-C  azelastine (ASTELIN) 0.1 % nasal spray Place 2 sprays into both nostrils 2 (two) times daily. Use in each nostril as directed 12/03/22   [name], MD  budesonide-formoterol [facility]) 80-4.5 MCG/ACT inhaler Inhale 2 puffs into the lungs in the morning and at bedtime. 05/20/23   Jahnessa Vanduyn, Noberto Retort, PA-C  carboxymethylcellulose (REFRESH TEARS) 0.5 % SOLN Place 1 drop into both eyes 3 (three) times daily as needed (Burning eyes).    [provider]  cetirizine (ZYRTEC) 10 MG tablet Take 10 mg by mouth every morning. Alternate every 4 weeks with loratadine    [provider]  Cholecalciferol 2000 units TABS Take 1 tablet (2,000 Units total) by mouth daily. 02/17/17   [name], MD  clopidogrel (PLAVIX) 75 MG tablet Take 1 tablet (75 mg total) by mouth daily with breakfast. 01/07/23   Janetta Hora, PA-C  docusate sodium (COLACE) 100 MG capsule Take 1 capsule (100 mg total) by mouth daily. 01/18/23   Carlisle Beers, FNP  doxycycline (VIBRAMYCIN) 100 MG capsule Take 1 capsule (100 mg total) by mouth 2 (two) times daily. 05/20/23    Rosellen Lichtenberger, Noberto Retort, PA-C  ezetimibe (ZETIA) 10 MG tablet Take 1 tablet (10 mg total) by mouth daily. 04/15/23   Janetta Hora, PA-C  fluorouracil (EFUDEX) 5 % cream Apply topically 2 (two) times daily. START ON JANUARY 1st, 2025 12/21/22   Terri Piedra, DO  fluticasone (FLONASE) 50 MCG/ACT nasal spray USE 1 SPRAY IN EACH NOSTRIL EVERY DAY Patient taking differently: Place 1 spray into both nostrils daily as needed for allergies or rhinitis. 11/10/21   [name], MD  furosemide (LASIX) 20 MG tablet Take 1 tablet (20 mg total) by mouth daily. 10/15/22 01/13/23  Tereso Newcomer T, PA-C  hydrocortisone 2.5 % cream Apply topically 2 (two) times daily. 12/21/22   Terri Piedra, DO  irbesartan (AVAPRO) 150 MG tablet Take 0.5 tablets (75 mg total) by mouth in the morning and at bedtime. 06/02/22   Tereso Newcomer T, PA-C  isosorbide mononitrate (IMDUR) 30 MG 24 hr tablet Take 1 tablet (30 mg total) by mouth daily. 04/15/23   Janetta Hora, PA-C  loratadine (CLARITIN) 10 MG tablet Take 10 mg by mouth daily as needed for allergies. Alternate with Zyrtec every 4 weeks    [provider]  meclizine (ANTIVERT) 25 MG tablet Take 25 mg by mouth 3 (three) times daily as needed for dizziness. OTC    [provider]  montelukast (SINGULAIR) 10 MG tablet Take 1 tablet (10 mg total) by mouth at bedtime. 12/03/22   [name], MD  nitroGLYCERIN (NITROSTAT) 0.4 MG SL tablet Place 1 tablet (0.4 mg total) under the tongue every 5 (five) minutes as needed for chest pain. 06/02/22 04/14/23  Tereso Newcomer T, PA-C  nystatin (MYCOSTATIN) 100000 UNIT/ML suspension Take 5 mLs (500,000 Units total) by mouth 4 (four) times daily. 05/20/23   Eveny Anastas, Noberto Retort, PA-C  pantoprazole (PROTONIX) 40 MG tablet Take 1 tablet (40 mg total) by mouth daily. 01/06/23 01/06/24  Janetta Hora, PA-C  promethazine-dextromethorphan (PROMETHAZINE-DM) 6.25-15 MG/5ML syrup Take 5 mLs by mouth 4 (four) times daily as  needed for cough. Do not use and drive - May make drowsy. 04/29/23   Prescilla Sours, FNP  simvastatin (ZOCOR) 40  MG tablet Take 1 tablet (40 mg total) by mouth daily. 04/15/23   Ardia Kraft, PA-C  Spacer/Aero-Holding Chambers (COMPACT SPACE CHAMBER) DEVI Use with the albuterol inhaler 04/29/23   Guss Legacy, FNP    Family History Family History  Problem Relation Age of Onset   Hypertension Mother    Breast cancer Mother    Hypothyroidism Mother    Diabetes type II Mother    Heart disease Mother    Heart attack Father        CABG @ 51   CAD Father    Breast cancer Sister        x 2   Colon cancer Maternal Aunt    Breast cancer Maternal Aunt    Colon cancer Paternal Uncle    Pancreatic cancer Other        PGA   Breast cancer Paternal Grandmother    Stomach cancer Neg Hx    Throat cancer Neg Hx    Esophageal cancer Neg Hx     Social History Social History   Tobacco Use   Smoking status: Former    Current packs/day: 0.00    Average packs/day: 1 pack/day for 40.0 years (40.0 ttl pk-yrs)    Types: Cigarettes    Start date: 02/03/1960    Quit date: 02/03/2000    Years since quitting: 23.3    Passive exposure: Never   Smokeless tobacco: Former    Types: Chew   Tobacco comments:    discussed LDCT   Vaping Use   Vaping status: Never Used  Substance Use Topics   Alcohol use: No    Alcohol/week: 0.0 standard drinks of alcohol   Drug use: No     Allergies   Crestor [rosuvastatin], Lipitor [atorvastatin], Sulfamethoxazole-trimethoprim, Tape, Amoxicillin, Celebrex [celecoxib], Neomycin, Neosporin [neomycin-bacitracin zn-polymyx], and Polysporin [bacitracin-polymyxin b]   Review of Systems Review of Systems  Constitutional:  Positive for activity change and fatigue. Negative for appetite change and fever.  HENT:  Positive for congestion. Negative for sinus pressure, sneezing and sore throat.   Respiratory:  Positive for cough, chest tightness, shortness of breath and  wheezing.   Cardiovascular:  Negative for chest pain.  Gastrointestinal:  Negative for abdominal pain, diarrhea, nausea and vomiting.  Neurological:  Negative for dizziness, light-headedness and headaches.     Physical Exam Triage Vital Signs ED Triage Vitals  Encounter Vitals Group     BP 05/20/23 1206 (!) 159/82     Systolic BP Percentile --      Diastolic BP Percentile --      Pulse Rate 05/20/23 1206 65     Resp 05/20/23 1206 20     Temp 05/20/23 1206 97.8 F (36.6 C)     Temp Source 05/20/23 1206 Oral     SpO2 05/20/23 1206 97 %     Weight --      Height --      Head Circumference --      Peak Flow --      Pain Score 05/20/23 1208 0     Pain Loc --      Pain Education --      Exclude from Growth Chart --    No data found.  Updated Vital Signs BP (!) 159/82 (BP Location: Right Arm)   Pulse 65   Temp 97.8 F (36.6 C) (Oral)   Resp 20   SpO2 97%   Visual Acuity Right Eye Distance:   Left Eye Distance:   Bilateral  Distance:    Right Eye Near:   Left Eye Near:    Bilateral Near:     Physical Exam Vitals reviewed.  Constitutional:      General: She is awake. She is not in acute distress.    Appearance: Normal appearance. She is well-developed. She is not ill-appearing.     Comments: Very pleasant female appears stated age in no acute distress sitting comfortably in exam room  HENT:     Head: Normocephalic and atraumatic.     Right Ear: Tympanic membrane, ear canal and external ear normal. Tympanic membrane is not erythematous or bulging.     Left Ear: Tympanic membrane, ear canal and external ear normal. Tympanic membrane is not erythematous or bulging.     Nose:     Right Sinus: No maxillary sinus tenderness or frontal sinus tenderness.     Left Sinus: No maxillary sinus tenderness or frontal sinus tenderness.     Mouth/Throat:     Pharynx: Uvula midline. No oropharyngeal exudate or posterior oropharyngeal erythema.     Comments: Normal-appearing  posterior oropharynx; no white plaque or significant erythema concerning for thrush Cardiovascular:     Rate and Rhythm: Normal rate and regular rhythm.     Heart sounds: Normal heart sounds, S1 normal and S2 normal. No murmur heard.    Comments: 1+ pitting edema to mid anterior tibia bilaterally Pulmonary:     Effort: Pulmonary effort is normal.     Breath sounds: Wheezing present. No rhonchi or rales.     Comments: Widespread wheezing Musculoskeletal:     Right lower leg: 1+ Edema present.     Left lower leg: 1+ Edema present.  Psychiatric:        Behavior: Behavior is cooperative.      UC Treatments / Results  Labs (all labs ordered are listed, but only abnormal results are displayed) Labs Reviewed - No data to display  EKG   Radiology No results found.  Procedures Procedures (including critical care time)  Medications Ordered in UC Medications  ipratropium-albuterol (DUONEB) 0.5-2.5 (3) MG/3ML nebulizer solution 3 mL (3 mLs Nebulization Given 05/20/23 1248)    Initial Impression / Assessment and Plan / UC Course  I have reviewed the triage vital signs and the nursing notes.  Pertinent labs & imaging results that were available during my care of the patient were reviewed by me and considered in my medical decision making (see chart for details).     Patient is well-appearing, afebrile, nontoxic, nontachycardic, with oxygen saturation of 97% on room air.  She did have widespread wheezing on exam but this improved following dose of DuoNeb in clinic.  Chest x-ray was obtained that showed peribronchial thickening consistent with reactive airway disease based on my primary read.  At the time of discharge we were reading for radiologist over read and we will contact her if this differs and changes her treatment plan.  Given her prolonged and worsening symptoms will cover for secondary bacterial infection with doxycycline 100 mg twice daily for 7 days.  We discussed that she  should avoid prolonged sun exposure while on this medication due to associated photosensitivity.  She has had some increase in swelling and so we discussed that symptoms could be related to CHF exacerbation, however, she reports stable dry weight and has only mild edema on exam.  She was encouraged to take her Lasix as prescribed and we discussed the importance of sodium restriction.  She will contact her cardiologist  as soon as she leaves to schedule an appointment as soon as possible for further evaluation.  We did discuss potential utility of going to the emergency room, however, given her stable vital signs we will attempt outpatient treatment but discussed that if she is not improving or if anything worsens she should go to the ER to which she expressed understanding.  She has responded to steroids at previous visits but we discussed that I am concerned this is contributing to her swelling and so we will attempt inhaled corticosteroid over systemic steroid to manage bronchospasm with hopefully fewer adverse reactions.  We did discuss that this can cause thrush and is important that she rinses her mouth following use of this medication.  She was provided a refill of nystatin to have on hand should she develop any thrush symptoms.  If she is not feeling better within a few days or if anything worsens and she has chest pain, worsening shortness of breath, persistent wheezing despite medication, sudden weight increase, significant peripheral edema she needs to go to the emergency room immediately for further evaluation and management to which she and her husband expressed understanding.  Strict return precautions given.  All questions were answered to patient's satisfaction.  Final Clinical Impressions(s) / UC Diagnoses   Final diagnoses:  Subacute cough  Sinobronchitis  Shortness of breath  Wheezing     Discharge Instructions      Please start doxycycline 100 mg twice daily for 7 days.  Stay out of  the sun while on this medication as it caused you to have a sunburn.  I am hesitant to begin additional steroids as I am concerned that that is leading to your swelling.  Lets try Symbicort twice daily for 1 week.  Rinse your mouth following use of this medication to prevent thrush.  If you get thrush I have called in nystatin to have on hand to be used as previously prescribed.  Please take your Lasix.  As we discussed, I am concerned that it is also possible you are having some fluid issues related to your heart that are contributing to the symptoms.  I would like you to follow-up with your cardiologist as soon as possible.  Please call them to schedule an appointment when you leave here.  If you are not feeling better in 24 hours or if anything worsens overnight and you have worsening cough, shortness of breath, oxygen below 93%, fever, weakness, nausea/vomiting, chest pain you need to go to the emergency room as we discussed.     ED Prescriptions     Medication Sig Dispense Auth. Provider   budesonide-formoterol (SYMBICORT) 80-4.5 MCG/ACT inhaler  (Status: Discontinued) Inhale 2 puffs into the lungs in the morning and at bedtime. 1 each Demetrice Amstutz K, PA-C   doxycycline (VIBRAMYCIN) 100 MG capsule  (Status: Discontinued) Take 1 capsule (100 mg total) by mouth 2 (two) times daily. 14 capsule Caidence Higashi K, PA-C   nystatin (MYCOSTATIN) 100000 UNIT/ML suspension  (Status: Discontinued) Take 5 mLs (500,000 Units total) by mouth 4 (four) times daily. 60 mL Chianne Byrns K, PA-C   budesonide-formoterol (SYMBICORT) 80-4.5 MCG/ACT inhaler Inhale 2 puffs into the lungs in the morning and at bedtime. 1 each Melony Tenpas K, PA-C   doxycycline (VIBRAMYCIN) 100 MG capsule Take 1 capsule (100 mg total) by mouth 2 (two) times daily. 14 capsule Dayani Winbush K, PA-C   nystatin (MYCOSTATIN) 100000 UNIT/ML suspension Take 5 mLs (500,000 Units total) by mouth 4 (four)  times daily. 60 mL Electa Sterry K, PA-C       PDMP not reviewed this encounter.   Budd Cargo, PA-C 05/20/23 1347

## 2023-05-21 ENCOUNTER — Other Ambulatory Visit (HOSPITAL_COMMUNITY): Payer: Self-pay

## 2023-05-21 ENCOUNTER — Other Ambulatory Visit: Payer: Self-pay

## 2023-05-21 ENCOUNTER — Ambulatory Visit: Attending: Cardiology | Admitting: Cardiology

## 2023-05-21 VITALS — BP 122/66 | HR 83 | Resp 16 | Ht 62.0 in | Wt 168.0 lb

## 2023-05-21 DIAGNOSIS — I442 Atrioventricular block, complete: Secondary | ICD-10-CM

## 2023-05-21 DIAGNOSIS — I2510 Atherosclerotic heart disease of native coronary artery without angina pectoris: Secondary | ICD-10-CM | POA: Diagnosis not present

## 2023-05-21 DIAGNOSIS — Z952 Presence of prosthetic heart valve: Secondary | ICD-10-CM

## 2023-05-21 DIAGNOSIS — I10 Essential (primary) hypertension: Secondary | ICD-10-CM | POA: Diagnosis not present

## 2023-05-21 DIAGNOSIS — Z950 Presence of cardiac pacemaker: Secondary | ICD-10-CM

## 2023-05-21 DIAGNOSIS — R918 Other nonspecific abnormal finding of lung field: Secondary | ICD-10-CM

## 2023-05-21 DIAGNOSIS — R0602 Shortness of breath: Secondary | ICD-10-CM | POA: Diagnosis not present

## 2023-05-21 DIAGNOSIS — I5032 Chronic diastolic (congestive) heart failure: Secondary | ICD-10-CM

## 2023-05-21 NOTE — Progress Notes (Addendum)
 Cardiology Office Note:    ID:  Heather, Crawford 1945/11/26, MRN [id number]  PCP:  Joshua Debby CROME, MD  [facility] Cardiologist:  Ozell Fell, MD  [facility] Electrophysiologist:  Will Gladis Norton, MD   Referring MD: Joshua Debby CROME, MD   Chief Complaint  Patient presents with   Follow-up    ED follow up   Shortness of Breath    History of Present Illness:    Heather Crawford is a 78 y.o. 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   Patient presents today for sick visit with the DOD for evaluation of dyspnea and concerns for volume overload.  She regularly follows with Dr. Fell on a longitudinal basis.    [name] has been followed for progressive prosthetic aortic valve stenosis over the past several years. Recently she has been suffering from increasing DOE. Echo 10/14/22 showed EF 55% and severe bioprosthetic aortic valve stenosis with a mean gradient of , mild AI, as well as severe MAC with mild MR. [facility] 11/26/22 showed patent left main, LAD, and left circumflex, with calcification and mild nonobstructive plaquing stable from the previous cardiac catheterization study as well as chronic occlusion of the RCA with patent RIMA to RCA graft. She underwent  VIV TAVR with a 23 mm Edwards Sapien 3 Ultra Resilia THV via the TF approach on 01/05/23. Sentinel cerebral embolic protection was used given VIV TAVR. Post operative echo showed EF 55%, mod concentric LVH, normally functioning TAVR with a mean gradient of 13.3 mmHg and no PVL as well as moderate MAC and mild MR. Discharged on aspirin  and  Plavix  x 3 months followed by aspirin  alone.   Discussed the use of AI scribe software for clinical note transcription with the patient, who gave verbal consent to proceed.  History of Present Illness   She has been experiencing shortness of breath for over a week, which has not improved and may have worsened. The shortness of breath occurs both at rest and with activity. There is associated wheezing and production of pink-tinged sputum. Patient denies orthopnea, PND, and LE swelling.   She was seen at an urgent care facility yesterday where a chest x-ray was performed, and she was later informed by phone that the radiologist diagnosed her with pneumonia. She has been experiencing wheezing and was previously treated for bronchitis, which did not fully resolve. When she left urgent care she was given doxycycline  and after her chest x-ray results she was given Cefpodoxime  proxetil. She is now unsure which antibiotic to take.   She has a history of asthma but denies any history of COPD. She has been to urgent care multiple times over the past five weeks for various issues including bronchitis, a fall, and thrush.    Besides the chest x-ray no labs were done at the urgent care, last visit was 05/20/2023.   Patient states her husband who accompanies her has had congestion as well.   No prior  hx of DVT or PE. No recent prolong periods of immobilization.     Past Medical History:  Diagnosis Date   Bilateral carotid artery disease (HCC) 08/15/2012   Carotid US  11/2021: Bilat ICA 1-39   CAD (coronary artery disease)    a. s/p CABG 4/12: RIMA-RCA   Carotid artery disease (HCC)    Carotid US  10/21: Bilateral ICA 1-39; left subclavian stenosis   CHF (congestive heart failure) (HCC)    chronic diastolic CHF   Chronic back pain    H/O BACK SURGERY   Coronary artery disease    adenosine  myoview  2/12 w/EF 56%, ISCHEMIA W/SCAR IN THE MID TO APICAL ANTERIO WALL, SEPTAL WALL, AND APEX. LHC 3/12 W/EF  55%, 40% OSTIAL RCA, 60% MID RCA, 95% DISTAL RC A, INTERVENTION  ATTEMPTED BUT UNABLE TO ADEQUADATELY SEAT  CATHETER.   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 Wonda and Maryjane. The surgical valve was fractured.   Squamous cell carcinoma of skin    nose   Stenosis of left subclavian artery (HCC)    Carotid US  10/22: Bilateral ICA 1-39; L subclavian stenosis   Valvular heart disease s/p AVR in 2012 02/07/2010   AoV Replacement with Bovine prosthesis ' 12 Echo 2/23: EF 60-65, no RWMA, GR 1 DD, normal RVSF, normal PASP, AVR without aortic stenosis or AI (mean gradient 22 mmHg)     Current Medications: Current Meds  Medication Sig   acetaminophen  (TYLENOL ) 325 MG tablet Take 1-2 tablets (325-650 mg total) by mouth every 4 (four) hours as needed for mild pain.   albuterol  (VENTOLIN  HFA) 108 (90 Base) MCG/ACT inhaler Inhale 2 puffs into the lungs every 4 (four) hours as needed for wheezing or shortness of breath.   amLODipine  (NORVASC ) 2.5 MG tablet Take 1 tablet (2.5 mg total) by mouth daily.   aspirin  EC 81 MG tablet Take 1 tablet (81 mg total) by mouth daily. Swallow whole.   azelastine  (ASTELIN ) 0.1 % nasal spray Place 2 sprays into both nostrils 2 (two) times daily. Use in each nostril as directed   carboxymethylcellulose (REFRESH TEARS) 0.5 % SOLN Place 1 drop into both eyes 3 (three) times daily as needed (Burning eyes).   cefpodoxime  (VANTIN ) 200 MG tablet Take 1 tablet (200 mg total) by mouth 2 (two) times  daily.   cetirizine (ZYRTEC) 10 MG tablet Take 10 mg by mouth every morning. Alternate every 4 weeks with loratadine    Cholecalciferol  2000 units TABS Take 1 tablet (2,000 Units total) by mouth daily.   doxycycline  (VIBRAMYCIN ) 100 MG capsule Take 1 capsule (100 mg total) by mouth 2 (two) times daily.   ezetimibe  (ZETIA ) 10 MG tablet Take 1 tablet (10 mg total) by mouth daily.   fluticasone  (FLONASE ) 50 MCG/ACT nasal spray USE 1 SPRAY IN EACH NOSTRIL EVERY DAY (Patient taking differently: Place 1 spray  into both nostrils daily as needed for allergies or rhinitis.)   fluticasone -salmeterol (ADVAIR  DISKUS) 100-50 MCG/ACT AEPB Inhale 1 puff into the lungs every 12 (twelve) hours.   irbesartan  (AVAPRO ) 150 MG tablet Take 0.5 tablets (75 mg total) by mouth in the morning and at bedtime.   isosorbide  mononitrate (IMDUR ) 30 MG 24 hr tablet Take 1 tablet (30 mg total) by mouth daily.   loratadine  (CLARITIN ) 10 MG tablet Take 10 mg by mouth daily as needed for allergies. Alternate with Zyrtec every 4 weeks   meclizine  (ANTIVERT ) 25 MG tablet Take 25 mg by mouth 3 (three) times daily as needed for dizziness. OTC   montelukast  (SINGULAIR ) 10 MG tablet Take 1 tablet (10 mg total) by mouth at bedtime.   nystatin  (MYCOSTATIN ) 100000 UNIT/ML suspension Take 5 mLs (500,000 Units total) by mouth 4 (four) times daily.   simvastatin  (ZOCOR ) 40 MG tablet Take 1 tablet (40 mg total) by mouth daily.     ROS:   Review of Systems  HENT:  Positive for congestion.   Cardiovascular:  Positive for dyspnea on exertion. Negative for chest pain, claudication, irregular heartbeat, leg swelling, near-syncope, orthopnea, palpitations, paroxysmal nocturnal dyspnea and syncope.  Respiratory:  Positive for cough, shortness of breath and wheezing.   Hematologic/Lymphatic: Negative for bleeding problem.     EKGs   EKG Interpretation Date/Time:  Friday May 21 2023 11:33:56 EDT Ventricular Rate:  78 PR Interval:  162 QRS  Duration:  136 QT Interval:  450 QTC Calculation: 513 R Axis:   64  Text Interpretation: Normal sinus rhythm Left bundle branch block Non-specific ST-t changes When compared with ECG of 13-Jan-2023 13:59, T wave inversion no longer evident in Inferior leads Lateral ST-T are prominent compared to prior tracing. Confirmed by Michele Richardson [phone]) on 05/21/2023 12:16:00 PM   Physical Exam:    VS:  BP 122/66 (BP Location: Left Arm, Patient Position: Sitting, Cuff Size: Large)   Pulse 83   Resp 16   Ht 5' 2 (1.575 m)   Wt 168 lb (76.2 kg)   SpO2 95%   BMI 30.73 kg/m     Wt Readings from Last 3 Encounters:  05/21/23 168 lb (76.2 kg)  02/05/23 165 lb (74.8 kg)  01/18/23 163 lb (73.9 kg)     GEN: Well nourished, well developed in no acute distress NECK: No JVD CARDIAC: RRR, 1/6 flow murmur @ RUSB. No rubs, gallops RESPIRATORY:  Clear to auscultation without rales, wheezing or rhonchi  ABDOMEN: Soft, non-tender, non-distended EXTREMITIES:  No edema; No deformity.     ASSESSMENT:    1. Shortness of breath   2. S/P VIV TAVR (transcatheter aortic valve replacement)   3. Essential hypertension   4. Complete AV block (HCC)   5. Pacemaker   6. Coronary artery disease involving native coronary artery of native heart without angina pectoris   7. Chronic heart failure with preserved ejection fraction (HCC)   8. Pulmonary nodules    PLAN:    Shortness of breath Acute. Presents for sick visit w/ DOD. Multifactorial Shortness of breath with profound expiratory wheezing and pink-tinged sputum, likely infectious rather than cardiac. Differential includes pneumonia, bronchitis, and possible heart failure. - Order CBC and procalcitonin to assess for infection. - Will check BMP and BNP -If fluid markers are significantly elevated we will titrate her current dose of diuretics otherwise continue current medical therapy - Advise contacting primary care physician for further evaluation and to  discuss was antibiotic to  take going forward.  - Recommend ER visit if symptoms worsen or if unable to see primary care physician.  Severe bioprosthetic AS s/p VIV TAVR: Prior EF 60%, normally functioning TAVR with a mean gradient of 9 mmHg and no PVL. SBE discussed; she has azithromycin .   CHB s/p PPM: followed by Dr. Inocencio.   HTN: BP well controlled on Norvasc  2.5mg  daily, irbesartan  75mg  BID, imdur  30mg  daily and Lasix  20mg  daily. No changes made today.     CAD: [facility] 11/26/22 showed patent left main, LAD, and left circumflex, with calcification and mild nonobstructive plaquing stable from the previous cardiac catheterization study as well as chronic occlusion of the RCA with patent RIMA to RCA graft. Continue medical therapy with aspirin  81mg  daily and simvastatin  40mg  daily.    HFpEF: appears euvolemic. Continue Lasix  20mg  daily.  GDMT as discussed above.   Pulmonary nodules: pre TAVR CT showed small solid pulmonary nodules in the right middle lobe along the minor fissure, largest 0.3 cm. No follow-up needed if patient is low-risk (and has no known or suspected primary neoplasm). Non-contrast chest CT can be considered in 12 months if patient is high-risk.  Discussed with pt and given breast cancer history will get this set up at our 1 year office visit.   Plan of care discussed with patient and husband at today's office visit.  Patient had an appointment with Dr. Wonda in June 2025 and will keep that appointment for now unless a change in clinical status  Medication Adjustments/Labs and Tests Ordered: Current medicines are reviewed at length with the patient today.  Concerns regarding medicines are outlined above.  Orders Placed This Encounter  Procedures   Procalcitonin   Basic metabolic panel with GFR   CBC   Pro b natriuretic peptide (BNP)   Procalcitonin   EKG 12-Lead   No orders of the defined types were placed in this encounter.   Patient Instructions  Medication  Instructions:  Your physician recommends that you continue on your current medications as directed. Please refer to the Current Medication list given to you today.  *If you need a refill on your cardiac medications before your next appointment, please call your pharmacy*  Lab Work: To be completed today: BMP, Pro-BNP, CBC, Procalcitonin   If you have labs (blood work) drawn today and your tests are completely normal, you will receive your results only by: MyChart Message (if you have MyChart) OR A paper copy in the mail If you have any lab test that is abnormal or we need to change your treatment, we will call you to review the results.  Testing/Procedures: None ordered today.  Follow-Up: At [facility], you and your health needs are our priority.  As part of our continuing mission to provide you with exceptional heart care, we have created designated Provider Care Teams.  These Care Teams include your primary Cardiologist (physician) and Advanced Practice Providers (APPs -  Physician Assistants and Nurse Practitioners) who all work together to provide you with the care you need, when you need it.  We recommend signing up for the patient portal called MyChart.  Sign up information is provided on this After Visit Summary.  MyChart is used to connect with patients for Virtual Visits (Telemedicine).  Patients are able to view lab/test results, encounter notes, upcoming appointments, etc.  Non-urgent messages can be sent to your provider as well.   To learn more about what you can do with MyChart, go to [email or web address].  Your next appointment:   June 9  The format for your next appointment:   In Person  Provider:   Ozell Fell, MD{  Other Instructions   1st Floor: - Lobby - Registration  - Pharmacy  - Lab - Cafe  2nd Floor: - PV Lab - Diagnostic Testing (echo, CT, nuclear med)  3rd Floor: - Vacant  4th Floor: - TCTS (cardiothoracic surgery) - AFib  Clinic - Structural Heart Clinic - Vascular Surgery  - Vascular Ultrasound  5th Floor: - HeartCare Cardiology (general and EP) - Clinical Pharmacy for coumadin, hypertension, lipid, weight-loss medications, and med management appointments    Valet parking services will be available as well.      Madonna Large, DO, [facility]  [facility]  [address] [place], [place] [address]

## 2023-05-21 NOTE — Patient Instructions (Signed)
 Medication Instructions:  Your physician recommends that you continue on your current medications as directed. Please refer to the Current Medication list given to you today.  *If you need a refill on your cardiac medications before your next appointmen

## 2023-05-22 ENCOUNTER — Encounter: Payer: Self-pay | Admitting: Cardiology

## 2023-05-22 LAB — CBC
Hematocrit: 34.8 % (ref 34.0–46.6)
Hemoglobin: 11.5 g/dL (ref 11.1–15.9)
MCH: 29.9 pg (ref 26.6–33.0)
MCHC: 33 g/dL (ref 31.5–35.7)
MCV: 90 fL (ref 79–97)
Platelets: 221 10*3/uL (ref 150–450)
RBC: 3.85 x10E6/uL (ref 3.77–5.28)
RDW: 13.2 % (ref 11.7–15.4)
WBC: 4.9 10*3/uL (ref 3.4–10.8)

## 2023-05-22 LAB — BASIC METABOLIC PANEL WITH GFR
BUN/Creatinine Ratio: 16 (ref 12–28)
BUN: 16 mg/dL (ref 8–27)
CO2: 23 mmol/L (ref 20–29)
Calcium: 8.9 mg/dL (ref 8.7–10.3)
Chloride: 104 mmol/L (ref 96–106)
Creatinine, Ser: 1.03 mg/dL — ABNORMAL HIGH (ref 0.57–1.00)
Glucose: 160 mg/dL — ABNORMAL HIGH (ref 70–99)
Potassium: 4.7 mmol/L (ref 3.5–5.2)
Sodium: 141 mmol/L (ref 134–144)
eGFR: 55 mL/min/{1.73_m2} — ABNORMAL LOW (ref >59)

## 2023-05-22 LAB — PRO B NATRIURETIC PEPTIDE: NT-Pro BNP: 442 pg/mL (ref 0–738)

## 2023-05-22 LAB — PROCALCITONIN: Procalcitonin: 0.04 ng/mL (ref 0.00–0.08)

## 2023-05-23 ENCOUNTER — Encounter: Payer: Self-pay | Admitting: Cardiology

## 2023-05-28 NOTE — Addendum Note (Signed)
 Addended by: Lott Rouleau A on: 05/28/2023 12:51 PM   Modules accepted: Orders

## 2023-05-28 NOTE — Progress Notes (Signed)
 Remote pacemaker transmission.

## 2023-06-10 LAB — MICROALBUMIN / CREATININE URINE RATIO: Microalb Creat Ratio: <30

## 2023-06-16 ENCOUNTER — Other Ambulatory Visit: Payer: Self-pay | Admitting: Internal Medicine

## 2023-06-16 ENCOUNTER — Other Ambulatory Visit: Payer: Self-pay | Admitting: Physician Assistant

## 2023-06-16 DIAGNOSIS — J301 Allergic rhinitis due to pollen: Secondary | ICD-10-CM

## 2023-06-16 DIAGNOSIS — R058 Other specified cough: Secondary | ICD-10-CM

## 2023-06-17 ENCOUNTER — Other Ambulatory Visit: Payer: Self-pay

## 2023-06-17 ENCOUNTER — Other Ambulatory Visit (HOSPITAL_COMMUNITY): Payer: Self-pay

## 2023-06-17 MED ORDER — FUROSEMIDE 20 MG PO TABS
20.0000 mg | ORAL_TABLET | Freq: Every day | ORAL | 3 refills | Status: AC
  Filled 2023-06-17: qty 90, 90d supply, fill #0
  Filled 2023-10-04: qty 90, 90d supply, fill #1
  Filled 2023-10-31 – 2023-12-30 (×2): qty 90, 90d supply, fill #2

## 2023-06-17 MED ORDER — NITROGLYCERIN 0.4 MG SL SUBL
0.4000 mg | SUBLINGUAL_TABLET | SUBLINGUAL | 11 refills | Status: AC | PRN
  Filled 2023-06-17: qty 25, 7d supply, fill #0

## 2023-06-21 ENCOUNTER — Other Ambulatory Visit (HOSPITAL_COMMUNITY): Payer: Self-pay

## 2023-06-21 ENCOUNTER — Ambulatory Visit: Payer: HMO | Admitting: Dermatology

## 2023-06-21 ENCOUNTER — Encounter: Payer: Self-pay | Admitting: Dermatology

## 2023-06-21 ENCOUNTER — Other Ambulatory Visit: Payer: Self-pay

## 2023-06-21 VITALS — BP 89/58 | HR 82

## 2023-06-21 DIAGNOSIS — L304 Erythema intertrigo: Secondary | ICD-10-CM | POA: Diagnosis not present

## 2023-06-21 DIAGNOSIS — L570 Actinic keratosis: Secondary | ICD-10-CM | POA: Diagnosis not present

## 2023-06-21 DIAGNOSIS — B079 Viral wart, unspecified: Secondary | ICD-10-CM

## 2023-06-21 MED ORDER — KETOCONAZOLE 2 % EX CREA
1.0000 | TOPICAL_CREAM | Freq: Two times a day (BID) | CUTANEOUS | 8 refills | Status: DC
  Filled 2023-06-21: qty 60, 30d supply, fill #0

## 2023-06-21 NOTE — Progress Notes (Signed)
 Follow-Up Visit   Subjective  Heather Crawford is a 78 y.o. female who presents for the following: AK  Patient present today for follow up visit for Ak. Patient was last evaluated on 12/21/22. At this visit patient was prescribed 5-FU. Patient reports sxs are better. Patient denies medication changes.  The following portions of the chart were reviewed this encounter and updated as appropriate: medications, allergies, medical history  Review of Systems:  No other skin or systemic complaints except as noted in HPI or Assessment and Plan.  Objective  Well appearing patient in no apparent distress; mood and affect are within normal limits.  A focused examination was performed of the following areas: Face  Relevant exam findings are noted in the Assessment and Plan.  Left Buccal Cheek, Left Frontal Scalp (4), Left Parotid Area, Right Frontal Scalp (7) Verrucous papules  Right Forearm - Posterior (2) Erythematous thin papules/macules with gritty scale.   Assessment & Plan   1. Status post treatment for actinic keratoses and skin cancers - Assessment: Patient underwent efudex  treatment in January, resulting in expected erythema and irritation, indicating presence of precancerous lesions. She applied the hydrocortisone  cream as directed and the areas resolved, Current examination shows complete resolution of previous Aks/ scaly spots   - Plan:    Continue daily sunscreen application    Provide sunscreen samples    Schedule next full body examination in November  2. Verrucous papules - Assessment: Cluster of verrucous papules identified on the right forehead, consistent with warts. Total of eight warts observed. - Plan:    Perform cryotherapy on all identified warts    Instruct patient to apply plain Aquaphor to treated areas for one week    Educate patient on expected post-treatment course (crusty and irritated appearance)  3. Actinic keratoses - Assessment: Two actinic  keratoses (AKs) identified on examination. - Plan:    Perform cryotherapy on the two identified AKs  4. Intertrigo - Assessment: Patient reports recurrent yeast infections in the intertriginous areas, specifically under the breasts. Condition is consistent with intertrigo. - Plan:    Prescribe ketoconazole  cream for daily application until resolution    Instruct patient to mix with hydrocortisone  if itching occurs (limit hydrocortisone  use to 7 days maximum)    After resolution, apply ketoconazole  cream once weekly for prevention    Recommend ZiaSorb antifungal powder for use on non-cream days    Educate on proper drying of affected areas after showering    Prescribe large tube of ketoconazole  cream with 6 refills    Recommend washing with benzoyl peroxide    Suggest use of zinc-based products (Z-Bar zinc shampoo or zinc bar soap)    Provide patient education on chronic nature of condition and management strategies    Include product pictures in after-visit summary   VIRAL WARTS, UNSPECIFIED TYPE (13) Left Buccal Cheek, Left Frontal Scalp (4), Left Parotid Area, Right Frontal Scalp (7) Destruction of lesion - Left Frontal Scalp (4), Right Frontal Scalp (7) Complexity: simple   Destruction method: cryotherapy   Informed consent: discussed and consent obtained   Timeout:  patient name, date of birth, surgical site, and procedure verified Lesion destroyed using liquid nitrogen: Yes   Cryotherapy cycles:  13 Post-procedure details: wound care instructions given   AK (ACTINIC KERATOSIS) (2) Right Forearm - Posterior (2) Destruction of lesion - Right Forearm - Posterior (2) Complexity: simple   Destruction method: cryotherapy   Informed consent: discussed and consent obtained   Timeout:  patient name, date of birth, surgical site, and procedure verified Lesion destroyed using liquid nitrogen: Yes   Cryotherapy cycles:  2 Post-procedure details: wound care instructions given    INTERTRIGO    Return in about 6 months (around 12/22/2023) for TBSE.  I, Jetta Ager, am acting as Neurosurgeon for [employer], DO.  Documentation: I have reviewed the above documentation for accuracy and completeness, and I agree with the above.  Louana Roup, DO

## 2023-06-21 NOTE — Patient Instructions (Addendum)
 Date: Mon Jun 21 2023  Hello Heather Crawford,  Thank you for visiting today. Here is a summary of the key instructions:  - Treatments:   - Apply plain Aquaphor to treated areas for the next week   - Expect treated spots to become crusty and irritated (this is normal)  - Medications:   - Use ketoconazole  cream daily on affected areas until yeast infection clears   - Mix ketoconazole  with hydrocortisone  if itchy, but don't use hydrocortisone  for more than 7 days   - After infection clears, use ketoconazole  once a week to prevent flare-ups  - Skincare:   - Wear sunscreen every day   - Use ZeaSorb antifungal powder on days not using ketoconazole  cream   - Dry skin folds thoroughly after showering   - Consider washing with benzoyl peroxide or zinc bar soap (Z-Bar) for yeast prevention  - Follow-up:   - Next full body exam scheduled for November  Please reach out if you have any questions or concerns.  Warm regards,  Dr. Louana Roup Dermatology      Important Information   Due to recent changes in healthcare laws, you may see results of your pathology and/or laboratory studies on MyChart before the doctors have had a chance to review them. We understand that in some cases there may be results that are confusing or concerning to you. Please understand that not all results are received at the same time and often the doctors may need to interpret multiple results in order to provide you with the best plan of care or course of treatment. Therefore, we ask that you please give us  2 business days to thoroughly review all your results before contacting the office for clarification. Should we see a critical lab result, you will be contacted sooner.     If You Need Anything After Your Visit   If you have any questions or concerns for your doctor, please call our main line at [phone]. If no one answers, please leave a voicemail as directed and we will return your call as soon as possible.  Messages left after 4 pm will be answered the following business day.    You may also send us  a message via MyChart. We typically respond to MyChart messages within 1-2 business days.  For prescription refills, please ask your pharmacy to contact our office. Our fax number is [phone].  If you have an urgent issue when the clinic is closed that cannot wait until the next business day, you can page your doctor at the number below.     Please note that while we do our best to be available for urgent issues outside of office hours, we are not available 24/7.    If you have an urgent issue and are unable to reach us , you may choose to seek medical care at your doctor's office, retail clinic, urgent care center, or emergency room.   If you have a medical emergency, please immediately call 911 or go to the emergency department. In the event of inclement weather, please call our main line at [phone] for an update on the status of any delays or closures.  Dermatology Medication Tips: Please keep the boxes that topical medications come in in order to help keep track of the instructions about where and how to use these. Pharmacies typically print the medication instructions only on the boxes and not directly on the medication tubes.   If your medication is too expensive, please contact our  office at [phone] or send us  a message through MyChart.    We are unable to tell what your co-pay for medications will be in advance as this is different depending on your insurance coverage. However, we may be able to find a substitute medication at lower cost or fill out paperwork to get insurance to cover a needed medication.    If a prior authorization is required to get your medication covered by your insurance company, please allow us  1-2 business days to complete this process.   Drug prices often vary depending on where the prescription is filled and some pharmacies may offer cheaper prices.    The website www.goodrx.com contains coupons for medications through different pharmacies. The prices here do not account for what the cost may be with help from insurance (it may be cheaper with your insurance), but the website can give you the price if you did not use any insurance.  - You can print the associated coupon and take it with your prescription to the pharmacy.  - You may also stop by our office during regular business hours and pick up a GoodRx coupon card.  - If you need your prescription sent electronically to a different pharmacy, notify our office through [facility] or by phone at [phone]

## 2023-06-22 MED ORDER — AZELASTINE HCL 0.1 % NA SOLN
2.0000 | Freq: Two times a day (BID) | NASAL | 1 refills | Status: DC
  Filled 2023-06-22: qty 90, 75d supply, fill #0
  Filled 2023-10-04: qty 90, 75d supply, fill #1

## 2023-06-22 MED ORDER — MONTELUKAST SODIUM 10 MG PO TABS
10.0000 mg | ORAL_TABLET | Freq: Every day | ORAL | 1 refills | Status: DC
  Filled 2023-06-22: qty 90, 90d supply, fill #0
  Filled 2023-10-04: qty 90, 90d supply, fill #1

## 2023-06-23 ENCOUNTER — Other Ambulatory Visit (HOSPITAL_COMMUNITY): Payer: Self-pay

## 2023-07-02 ENCOUNTER — Ambulatory Visit

## 2023-07-02 VITALS — Ht 62.0 in | Wt 168.0 lb

## 2023-07-02 DIAGNOSIS — Z1231 Encounter for screening mammogram for malignant neoplasm of breast: Secondary | ICD-10-CM

## 2023-07-02 DIAGNOSIS — M8580 Other specified disorders of bone density and structure, unspecified site: Secondary | ICD-10-CM | POA: Diagnosis not present

## 2023-07-02 DIAGNOSIS — Z0000 Encounter for general adult medical examination without abnormal findings: Secondary | ICD-10-CM

## 2023-07-02 NOTE — Progress Notes (Signed)
 Subjective:   Heather Crawford is a 78 y.o. who presents for a Medicare Wellness preventive visit.  As a reminder, Annual Wellness Visits don't include a physical exam, and some assessments may be limited, especially if this visit is performed virtually. We may recommend an in-person follow-up visit with your provider if needed.  Visit Complete: Virtual I connected with  Heather Litter on 07/02/23 by a audio enabled telemedicine application and verified that I am speaking with the correct person using two identifiers.  Patient Location: Home  Provider Location: Office/Clinic  I discussed the limitations of evaluation and management by telemedicine. The patient expressed understanding and agreed to proceed.  Vital Signs: Because this visit was a virtual/telehealth visit, some criteria may be missing or patient reported. Any vitals not documented were not able to be obtained and vitals that have been documented are patient reported.  VideoDeclined- This patient declined Librarian, academic. Therefore the visit was completed with audio only.  Persons Participating in Visit: Patient.  AWV Questionnaire: No: Patient Medicare AWV questionnaire was not completed prior to this visit.  Cardiac Risk Factors include: advanced age (>73men, >61 women);diabetes mellitus;dyslipidemia;Other (see comment), Risk factor comments: CKD, Stage 3,     Objective:     Today's Vitals   07/02/23 0807  Weight: 168 lb (76.2 kg)  Height: 5\' 2"  (1.575 m)   Body mass index is 30.73 kg/m.     07/02/2023    8:20 AM 11/26/2022    8:17 AM 06/24/2021   12:47 PM 04/07/2021    7:27 AM 06/19/2020   11:38 AM 01/18/2020    1:00 AM 01/17/2020    6:41 PM  Advanced Directives  Does Patient Have a Medical Advance Directive? Yes No Yes Yes Yes Yes   Type of Estate agent of [place];Living will  Living will;Healthcare Power of [employer] Power of  [place];Living will Living will;Healthcare Power of [employer] Power of [place];Living will Healthcare Power of [place];Living will  Does patient want to make changes to medical advance directive?   No - Patient declined No - Patient declined No - Patient declined No - Patient declined   Copy of Healthcare Power of Attorney in Chart? No - copy requested  No - copy requested  No - copy requested No - copy requested No - copy requested  Would patient like information on creating a medical advance directive?  No - Patient declined  No - Patient declined       Current Medications (verified) Outpatient Encounter Medications as of 07/02/2023  Medication Sig   acetaminophen  (TYLENOL ) 325 MG tablet Take 1-2 tablets (325-650 mg total) by mouth every 4 (four) hours as needed for mild pain.   albuterol  (VENTOLIN  HFA) 108 (90 Base) MCG/ACT inhaler Inhale 2 puffs into the lungs every 4 (four) hours as needed for wheezing or shortness of breath.   amLODipine  (NORVASC ) 2.5 MG tablet Take 1 tablet (2.5 mg total) by mouth daily.   aspirin  EC 81 MG tablet Take 1 tablet (81 mg total) by mouth daily. Swallow whole.   azelastine  (ASTELIN ) 0.1 % nasal spray Place 2 sprays into both nostrils 2 (two) times daily as directed   fluticasone  (FLONASE ) 50 MCG/ACT nasal spray USE 1 SPRAY IN EACH NOSTRIL EVERY DAY (Patient taking differently: Place 1 spray into both nostrils daily as needed for allergies or rhinitis.)   furosemide  (LASIX ) 20 MG tablet Take 1 tablet (20 mg total) by mouth daily.  irbesartan  (AVAPRO ) 150 MG tablet Take 0.5 tablets (75 mg total) by mouth in the morning and at bedtime.   isosorbide  mononitrate (IMDUR ) 30 MG 24 hr tablet Take 1 tablet (30 mg total) by mouth daily.   montelukast  (SINGULAIR ) 10 MG tablet Take 1 tablet (10 mg total) by mouth at bedtime.   nitroGLYCERIN  (NITROSTAT ) 0.4 MG SL tablet Place 1 tablet (0.4 mg total) under the tongue every 5 (five) minutes as needed for chest  pain.   simvastatin  (ZOCOR ) 40 MG tablet Take 1 tablet (40 mg total) by mouth daily.   carboxymethylcellulose (REFRESH TEARS) 0.5 % SOLN Place 1 drop into both eyes 3 (three) times daily as needed (Burning eyes). (Patient not taking: Reported on 07/02/2023)   cetirizine (ZYRTEC) 10 MG tablet Take 10 mg by mouth every morning. Alternate every 4 weeks with loratadine  (Patient not taking: Reported on 07/02/2023)   Cholecalciferol  2000 units TABS Take 1 tablet (2,000 Units total) by mouth daily. (Patient not taking: Reported on 07/02/2023)   doxycycline  (VIBRAMYCIN ) 100 MG capsule Take 1 capsule (100 mg total) by mouth 2 (two) times daily. (Patient not taking: Reported on 07/02/2023)   ezetimibe  (ZETIA ) 10 MG tablet Take 1 tablet (10 mg total) by mouth daily. (Patient not taking: Reported on 07/02/2023)   fluticasone -salmeterol (ADVAIR  DISKUS) 100-50 MCG/ACT AEPB Inhale 1 puff into the lungs every 12 (twelve) hours. (Patient not taking: Reported on 07/02/2023)   ketoconazole  (NIZORAL ) 2 % cream Apply 1 Application topically 2 (two) times daily. (Patient not taking: Reported on 07/02/2023)   loratadine  (CLARITIN ) 10 MG tablet Take 10 mg by mouth daily as needed for allergies. Alternate with Zyrtec every 4 weeks (Patient not taking: Reported on 07/02/2023)   meclizine  (ANTIVERT ) 25 MG tablet Take 25 mg by mouth 3 (three) times daily as needed for dizziness. OTC (Patient not taking: Reported on 07/02/2023)   nystatin  (MYCOSTATIN ) 100000 UNIT/ML suspension Take 5 mLs (500,000 Units total) by mouth 4 (four) times daily. (Patient not taking: Reported on 07/02/2023)   No facility-administered encounter medications on file as of 07/02/2023.    Allergies (verified) Crestor  [rosuvastatin ], Lipitor [atorvastatin ], Sulfamethoxazole-trimethoprim, Tape, Amoxicillin, Celebrex  [celecoxib ], Neomycin, Neosporin [neomycin-bacitracin zn-polymyx], and Polysporin [bacitracin-polymyxin b]   History: Past Medical History:  Diagnosis  Date   Bilateral carotid artery disease (HCC) 08/15/2012   Carotid US  11/2021: Bilat ICA 1-39   CAD (coronary artery disease)    a. s/p CABG 4/12: RIMA-RCA   Carotid artery disease (HCC)    Carotid US  10/21: Bilateral ICA 1-39; left subclavian stenosis   CHF (congestive heart failure) (HCC)    chronic diastolic CHF   Chronic back pain    H/O BACK SURGERY   Coronary artery disease    adenosine  myoview  2/12 w/EF 56%, ISCHEMIA W/SCAR IN THE MID TO APICAL ANTERIO WALL, SEPTAL WALL, AND APEX. LHC 3/12 W/EF 55%, 40% OSTIAL RCA, 60% MID RCA, 95% DISTAL RC A, INTERVENTION  ATTEMPTED BUT UNABLE TO ADEQUADATELY SEAT  CATHETER.   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 Arlester Ladd and Honey Lusty. The surgical valve was fractured.   Squamous cell carcinoma of skin    nose   Stenosis of left subclavian artery (HCC)    Carotid US  10/22: Bilateral ICA 1-39; L subclavian stenosis   Valvular heart disease s/p AVR in 2012 02/07/2010   AoV Replacement with Bovine prosthesis ' 12 Echo 2/23: EF 60-65, no RWMA, GR 1 DD, normal RVSF, normal PASP, AVR without aortic stenosis or AI (mean gradient 22 mmHg)   Past Surgical History:  Procedure Laterality Date   ABDOMINAL HYSTERECTOMY     APPENDECTOMY  1973   BACK SURGERY     Lumbar Lam and discectomy x 2   BREAST BIOPSY Right 10/2011   BREAST LUMPECTOMY Right    CARDIAC CATHETERIZATION  2012   CARDIAC VALVE REPLACEMENT  2012   Aortic   CORONARY ARTERY BYPASS GRAFT  2012   DILATION AND  CURETTAGE OF UTERUS     ENDOMETRIAL ABLATION     INTRAOPERATIVE TRANSTHORACIC ECHOCARDIOGRAM N/A 01/05/2023   Procedure: INTRAOPERATIVE TRANSTHORACIC ECHOCARDIOGRAM;  Surgeon: Arnoldo Lapping, MD;  Location: [facility] INVASIVE CV LAB;  Service: Cardiovascular;  Laterality: N/A;   LAPAROSCOPIC LYSIS INTESTINAL ADHESIONS     LUMBAR DISC SURGERY     DR. Layman Pries- [place]   PACEMAKER IMPLANT N/A 01/17/2020   Procedure: PACEMAKER IMPLANT;  Surgeon: Lei Pump, MD;  Location: [facility] INVASIVE CV LAB;  Service: Cardiovascular;  Laterality: N/A;   RIGHT/LEFT HEART CATH AND CORONARY/GRAFT ANGIOGRAPHY N/A 04/07/2021   Procedure: RIGHT/LEFT HEART CATH AND CORONARY/GRAFT ANGIOGRAPHY;  Surgeon: Sammy Crisp, MD;  Location: [facility] INVASIVE CV LAB;  Service: Cardiovascular;  Laterality: N/A;   RIGHT/LEFT HEART CATH AND CORONARY/GRAFT ANGIOGRAPHY N/A 11/26/2022   Procedure: RIGHT/LEFT HEART CATH AND CORONARY/GRAFT ANGIOGRAPHY;  Surgeon: Arnoldo Lapping, MD;  Location: [facility] INVASIVE CV LAB;  Service: Cardiovascular;  Laterality: N/A;   SKIN SURGERY     skin sarcomas removed   TONSILLECTOMY     1955   TONSILLECTOMY     Family History  Problem Relation Age of Onset   Hypertension Mother    Breast cancer Mother    Hypothyroidism Mother    Diabetes type II Mother    Heart disease Mother    Heart attack Father        CABG @ 62   CAD Father    Breast cancer Sister        x 2   Colon cancer Maternal Aunt    Breast cancer Maternal Aunt    Colon cancer Paternal Uncle    Pancreatic cancer Other        PGA   Breast cancer Paternal Grandmother    Stomach cancer Neg Hx    Throat cancer Neg Hx    Esophageal cancer Neg Hx    Social History   Socioeconomic History   Marital status: Married    Spouse name: Hewitt Lou   Number of children: 2   Years of education: Not on file   Highest education level: Not on file  Occupational History   Occupation: retired    Associate Professor: RETIRED    Comment: [employer], Clinical cytogeneticist, CERTIFIED AS SPECIAL ED TEACHER  Tobacco Use   Smoking status: Former    Current packs/day: 0.00    Average packs/day: 1 pack/day for 40.0 years (40.0  ttl pk-yrs)    Types: Cigarettes    Start date: 02/03/1960    Quit date: 02/03/2000    Years since quitting: 23.4    Passive exposure: Never   Smokeless tobacco: Former    Types: Chew   Tobacco comments:    discussed LDCT   Vaping Use   Vaping status: Never Used  Substance and Sexual Activity   Alcohol use: No    Alcohol/week: 0.0 standard drinks of alcohol   Drug use: No   Sexual activity: Yes  Other Topics Concern   Not on file  Social History Narrative   MARRIED1 SON 1 DAUGHTER 2 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      Lives with husband and 2 cats/2025   Social Drivers of Corporate investment banker Strain: Low Risk  (07/02/2023)   Overall Financial Resource Strain (CARDIA)    Difficulty of Paying Living Expenses: Not hard at all  Food Insecurity: No Food Insecurity (07/02/2023)   Hunger Vital Sign    Worried About Running Out of Food in the Last Year: Never true    Ran Out of Food in the Last Year: Never true  Transportation Needs: No Transportation Needs (07/02/2023)   PRAPARE - Administrator, Civil Service (Medical): No    Lack of Transportation (Non-Medical): No  Physical Activity: Inactive (07/02/2023)   Exercise Vital Sign    Days of Exercise per Week: 0 days    Minutes of Exercise per Session: 0 min  Stress: No Stress Concern Present (07/02/2023)   [employer] of Occupational Health - Occupational Stress Questionnaire    Feeling of Stress : Not at all  Social Connections: Socially Integrated (07/02/2023)   Social Connection and Isolation Panel [NHANES]    Frequency of Communication with Friends and Family: More than three times a week    Frequency of Social Gatherings with Friends and Family: More than three times a week    Attends  Religious Services: More than 4 times per year    Active Member of [employer] or Organizations: Yes    Attends Banker Meetings: Never    Marital Status: Married    Tobacco Counseling Counseling given: Not Answered Tobacco comments: discussed LDCT     Clinical Intake:  Pre-visit preparation completed: Yes  Pain : No/denies pain     BMI - recorded: 30.73 Nutritional Status: BMI > 30  Obese Nutritional Risks: None Diabetes: Yes CBG done?: No Did pt. bring in CBG monitor from home?: No  Lab Results  Component Value Date   HGBA1C 6.8 (H) 07/06/2022   HGBA1C 7.1 (H) 07/02/2021   HGBA1C 6.9 (H) 06/24/2020     How often do you need to have someone help you when you read instructions, pamphlets, or other written materials from your doctor or pharmacy?: 1 - Never  Interpreter Needed?: No  Information entered by :: Dannon Nguyenthi, RMA   Activities of Daily Living     07/02/2023    8:01 AM 01/05/2023    6:15 AM  In your present state of health, do you have any difficulty performing the following activities:  Hearing? 0   Vision? 0   Difficulty concentrating or making decisions? 0   Walking or climbing stairs? 0   Dressing or bathing? 0   Doing errands, shopping? 0 0  Preparing Food and eating ? N   Using the Toilet? N   In the past six months, have  you accidently leaked urine? N   Do you have problems with loss of bowel control? N   Managing your Medications? N   Managing your Finances? N   Housekeeping or managing your Housekeeping? N     Patient Care Team: Arcadio Knuckles, MD as PCP - General (Internal Medicine) Arnoldo Lapping, MD as PCP - Cardiology (Cardiology) Lei Pump, MD as PCP - Electrophysiology (Cardiology) Zelphia Higashi, MD (Cardiothoracic Surgery) Hazle Lites, MD as Consulting Physician (Orthopedic Surgery) Dorthey Gave, PA-C as Physician Assistant (Dermatology) [place], Jannetta Men, OD (Optometry) Sherwood Donath as Physician Assistant (Cardiology) Ronni Colace, MD as Consulting Physician (Ophthalmology)  Indicate any recent Medical Services you may have received from other than [facility] providers in the past year (date may be approximate).     Assessment:    This is a routine wellness examination for Akiah.  Hearing/Vision screen Hearing Screening - Comments:: Denies hearing difficulties   Vision Screening - Comments:: Has cataract/[facility]   Goals Addressed             This Visit's Progress    Weight (lb) < 160 lb (72.6 kg)   168 lb (76.2 kg)    Will try again to lose some weight;  Will drink more water/still working on it/2025       Depression Screen     07/02/2023    8:23 AM 07/06/2022   10:53 AM 06/24/2021   12:46 PM 06/19/2020   11:37 AM 05/04/2019   10:38 AM 04/04/2018    9:29 AM 02/08/2017   11:06 AM  PHQ 2/9 Scores  PHQ - 2 Score 0 0 0 0 0 0 0  PHQ- 9 Score 0 0     1    Fall Risk     07/02/2023    8:20 AM 07/06/2022   10:53 AM 06/24/2021   12:48 PM 06/19/2020   11:38 AM 05/04/2019   10:37 AM  Fall Risk   Falls in the past year? 1 0 1 1 0  Number falls in past yr: 0 0 0 0 0  Injury with Fall? 1 0 0 1 0  Risk for fall due to : Impaired balance/gait No Fall Risks No Fall Risks  No Fall Risks  Follow up Falls evaluation completed;Falls prevention discussed Falls evaluation completed Falls evaluation completed Falls evaluation completed Falls evaluation completed;Education provided;Falls prevention discussed    MEDICARE RISK AT HOME:  Medicare Risk at Home Any stairs in or around the home?: Yes (outside) If so, are there any without handrails?: Yes Home free of loose throw rugs in walkways, pet beds, electrical cords, etc?: Yes Adequate lighting in your home to reduce risk of falls?: Yes Life alert?: No Use of a cane, walker or w/c?: No Grab bars in the bathroom?: Yes Shower chair or bench in shower?: Yes Elevated toilet seat or a handicapped toilet?:  Yes  TIMED UP AND GO:  Was the test performed?  No  Cognitive Function: Declined/Normal: No cognitive concerns noted by patient or family. Patient alert, oriented, able to answer questions appropriately and recall recent events. No signs of memory loss or confusion.    02/08/2017   11:10 AM 10/22/2015   12:46 PM 10/11/2014   12:48 PM  MMSE - Mini Mental State Exam  Not completed:  --  Unable to complete  Orientation to time 5    Orientation to Place 5    Registration 3    Attention/ Calculation 5  Recall 2    Language- name 2 objects 2    Language- repeat 1    Language- follow 3 step command 3    Language- read & follow direction 1    Write a sentence 1    Copy design 1    Total score 29       Significant value        06/24/2021   12:48 PM 05/04/2019   10:40 AM  6CIT Screen  What Year? 0 points 0 points  What month? 0 points 0 points  What time? 0 points 0 points  Count back from 20 0 points 0 points  Months in reverse 0 points 0 points  Repeat phrase 0 points 0 points  Total Score 0 points 0 points    Immunizations Immunization History  Administered Date(s) Administered   Hep A / Hep B 10/17/2014, 04/16/2015   Hepatitis B, ADULT 11/16/2014   PFIZER(Purple Top)SARS-COV-2 Vaccination 02/14/2019, 03/07/2019   Pneumococcal Conjugate-13 08/16/2013   Pneumococcal Polysaccharide-23 08/12/2011, 09/12/2019   Td 02/21/2010   Tdap 02/21/2010, 06/24/2020    Screening Tests Health Maintenance  Topic Date Due   Zoster Vaccines- Shingrix (1 of 2) Never done   COVID-19 Vaccine (3 - Pfizer risk series) 04/04/2019   OPHTHALMOLOGY EXAM  12/09/2022   HEMOGLOBIN A1C  01/05/2023   Diabetic kidney evaluation - Urine ACR  07/06/2023   Colonoscopy  07/08/2023 (Originally 04/29/2020)   FOOT EXAM  07/08/2023   INFLUENZA VACCINE  09/03/2023   Diabetic kidney evaluation - eGFR measurement  05/20/2024   Medicare Annual Wellness (AWV)  07/01/2024   DTaP/Tdap/Td (4 - Td or Tdap)  06/25/2030   Pneumonia Vaccine 66+ Years old  Completed   DEXA SCAN  Completed   Hepatitis C Screening  Completed   HPV VACCINES  Aged Out   Meningococcal B Vaccine  Aged Out    Health Maintenance  Health Maintenance Due  Topic Date Due   Zoster Vaccines- Shingrix (1 of 2) Never done   COVID-19 Vaccine (3 - Pfizer risk series) 04/04/2019   OPHTHALMOLOGY EXAM  12/09/2022   HEMOGLOBIN A1C  01/05/2023   Diabetic kidney evaluation - Urine ACR  07/06/2023   Health Maintenance Items Addressed: Mammogram ordered, DEXA ordered, A1C, UACR (Urine Albumin:Creatinine Ratio), See Nurse Notes  Additional Screening:  Vision Screening: Recommended annual ophthalmology exams for early detection of glaucoma and other disorders of the eye.  Dental Screening: Recommended annual dental exams for proper oral hygiene  Community Resource Referral / Chronic Care Management: CRR required this visit?  No   CCM required this visit?  No   Plan:    I have personally reviewed and noted the following in the patient's chart:   Medical and social history Use of alcohol, tobacco or illicit drugs  Current medications and supplements including opioid prescriptions. Patient is not currently taking opioid prescriptions. Functional ability and status Nutritional status Physical activity Advanced directives List of other physicians Hospitalizations, surgeries, and ER visits in previous 12 months Vitals Screenings to include cognitive, depression, and falls Referrals and appointments  In addition, I have reviewed and discussed with patient certain preventive protocols, quality metrics, and best practice recommendations. A written personalized care plan for preventive services as well as general preventive health recommendations were provided to patient.   Altair Stanko L Luree Palla, CMA   07/02/2023   After Visit Summary: (MyChart) Due to this being a telephonic visit, the after visit summary with patients  personalized plan was offered  to patient via MyChart   Notes: Please refer to Routing Comments.

## 2023-07-02 NOTE — Patient Instructions (Signed)
 Heather Crawford , Thank you for taking time out of your busy schedule to complete your Annual Wellness Visit with me. I enjoyed our conversation and look forward to speaking with you again next year. I, as well as your care team,  appreciate your ongoing commitment to your health goals. Please review the following plan we discussed and let me know if I can assist you in the future. Your Game plan/ To Do List    Referrals: If you haven't heard from the office you've been referred to, please reach out to them at the phone provided.  You have an order for:  [x]   3D Mammogram  [x]   Bone Density     Please call for appointment:  [facility] [address] [place], [place] [address] [phone]  Make sure to wear two-piece clothing.  No lotions, powders, or deodorants the day of the appointment. Make sure to bring picture ID and insurance card.  Bring list of medications you are currently taking including any supplements.   Follow up Visits: Next Medicare AWV with our clinical staff: 07/03/2024.   Have you seen your provider in the last 6 months (3 months if uncontrolled diabetes)? Yes Next Office Visit with your provider: 07/07/2023.  Clinician Recommendations:  Aim for 30 minutes of exercise or brisk walking, 6-8 glasses of water, and 5 servings of fruits and vegetables each day. You are due for a A1c check and a urine, kidney evaluation during your next office visit.      This is a list of the screening recommended for you and due dates:  Health Maintenance  Topic Date Due   Zoster (Shingles) Vaccine (1 of 2) Never done   COVID-19 Vaccine (3 - Pfizer risk series) 04/04/2019   Eye exam for diabetics  12/09/2022   Hemoglobin A1C  01/05/2023   Yearly kidney health urinalysis for diabetes  07/06/2023   Colon Cancer Screening  07/08/2023*   Complete foot exam   07/08/2023   Flu Shot  09/03/2023   Yearly kidney function blood test for diabetes  05/20/2024   Medicare Annual  Wellness Visit  07/01/2024   DTaP/Tdap/Td vaccine (4 - Td or Tdap) 06/25/2030   Pneumonia Vaccine  Completed   DEXA scan (bone density measurement)  Completed   Hepatitis C Screening  Completed   HPV Vaccine  Aged Out   Meningitis B Vaccine  Aged Out  *Topic was postponed. The date shown is not the original due date.    Advanced directives: (Copy Requested) Please bring a copy of your health care power of attorney and living will to the office to be added to your chart at your convenience. You can mail to [facility] [address] [place], [place] [address] or email to [email or web address] Advance Care Planning is important because it:  [x]  Makes sure you receive the medical care that is consistent with your values, goals, and preferences  [x]  It provides guidance to your family and loved ones and reduces their decisional burden about whether or not they are making the right decisions based on your wishes.  Follow the link provided in your after visit summary or read over the paperwork we have mailed to you to help you started getting your Advance Directives in place. If you need assistance in completing these, please reach out to us  so that we can help you!  See attachments for Preventive Care and Fall Prevention Tips.

## 2023-07-07 ENCOUNTER — Ambulatory Visit: Admitting: Internal Medicine

## 2023-07-07 ENCOUNTER — Encounter: Payer: Self-pay | Admitting: Internal Medicine

## 2023-07-07 ENCOUNTER — Ambulatory Visit: Payer: Self-pay | Admitting: Internal Medicine

## 2023-07-07 VITALS — BP 148/62 | HR 60 | Temp 97.8°F | Resp 16 | Ht 62.0 in | Wt 168.2 lb

## 2023-07-07 DIAGNOSIS — K219 Gastro-esophageal reflux disease without esophagitis: Secondary | ICD-10-CM

## 2023-07-07 DIAGNOSIS — Z0001 Encounter for general adult medical examination with abnormal findings: Secondary | ICD-10-CM | POA: Insufficient documentation

## 2023-07-07 DIAGNOSIS — J301 Allergic rhinitis due to pollen: Secondary | ICD-10-CM

## 2023-07-07 DIAGNOSIS — E782 Mixed hyperlipidemia: Secondary | ICD-10-CM | POA: Diagnosis not present

## 2023-07-07 DIAGNOSIS — R058 Other specified cough: Secondary | ICD-10-CM

## 2023-07-07 DIAGNOSIS — Z0000 Encounter for general adult medical examination without abnormal findings: Secondary | ICD-10-CM

## 2023-07-07 DIAGNOSIS — Z7984 Long term (current) use of oral hypoglycemic drugs: Secondary | ICD-10-CM

## 2023-07-07 DIAGNOSIS — I25119 Atherosclerotic heart disease of native coronary artery with unspecified angina pectoris: Secondary | ICD-10-CM

## 2023-07-07 DIAGNOSIS — I10 Essential (primary) hypertension: Secondary | ICD-10-CM | POA: Diagnosis not present

## 2023-07-07 DIAGNOSIS — E118 Type 2 diabetes mellitus with unspecified complications: Secondary | ICD-10-CM

## 2023-07-07 DIAGNOSIS — N1831 Chronic kidney disease, stage 3a: Secondary | ICD-10-CM

## 2023-07-07 DIAGNOSIS — I6523 Occlusion and stenosis of bilateral carotid arteries: Secondary | ICD-10-CM

## 2023-07-07 LAB — URINALYSIS, ROUTINE W REFLEX MICROSCOPIC
Bilirubin Urine: NEGATIVE
Hgb urine dipstick: NEGATIVE
Ketones, ur: NEGATIVE
Nitrite: NEGATIVE
RBC / HPF: NONE SEEN (ref 0–?)
Specific Gravity, Urine: 1.01 (ref 1.000–1.030)
Total Protein, Urine: NEGATIVE
Urine Glucose: NEGATIVE
Urobilinogen, UA: 0.2 (ref 0.0–1.0)
pH: 6 (ref 5.0–8.0)

## 2023-07-07 LAB — LIPID PANEL
Cholesterol: 129 mg/dL (ref 0–200)
HDL: 49 mg/dL (ref >39.00)
LDL Cholesterol: 50 mg/dL (ref 0–99)
NonHDL: 79.76
Total CHOL/HDL Ratio: 3
Triglycerides: 150 mg/dL — ABNORMAL HIGH (ref 0.0–149.0)
VLDL: 30 mg/dL (ref 0.0–40.0)

## 2023-07-07 LAB — TSH: TSH: 5.91 u[IU]/mL — ABNORMAL HIGH (ref 0.35–5.50)

## 2023-07-07 LAB — MICROALBUMIN / CREATININE URINE RATIO
Creatinine,U: 53.2 mg/dL
Microalb Creat Ratio: 24.1 mg/g (ref 0.0–30.0)
Microalb, Ur: 1.3 mg/dL (ref 0.0–1.9)

## 2023-07-07 LAB — HEMOGLOBIN A1C: Hgb A1c MFr Bld: 7.3 % — ABNORMAL HIGH (ref 4.6–6.5)

## 2023-07-07 MED ORDER — OMEPRAZOLE 20 MG PO CPDR: 20.0000 mg | DELAYED_RELEASE_CAPSULE | Freq: Every day | ORAL | 1 refills | Status: DC

## 2023-07-07 MED ORDER — EMPAGLIFLOZIN 10 MG PO TABS
10.0000 mg | ORAL_TABLET | Freq: Every day | ORAL | 0 refills | Status: DC
Start: 2023-07-07 — End: 2023-07-12

## 2023-07-07 NOTE — Progress Notes (Signed)
 Subjective:  Patient ID: Heather Crawford, female    DOB: 18-May-1945  Age: 78 y.o. MRN: [id number]  CC: Annual Exam (No concerns. ), Hypertension, Hyperlipidemia, and Diabetes   HPI Heather Crawford presents for a CPX and f/up ---  Discussed the use of AI scribe software for clinical note transcription with the patient, who gave verbal consent to proceed.  History of Present Illness   Heather Crawford is a 78 year old female who presents for follow-up care after heart surgery.  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 Since the surgery, she has experienced weakness and fatigue but denies any chest pain or shortness of breath. She reports dizziness when bending over and standing up.  She experiences sinus drainage leading to a cough, particularly when the drainage gets into her throat. She previously took Singulair  at night and rotated Claritin  and Zyrtec, but stopped as they were ineffective. She currently uses an antihistamine nasal spray in the morning and Flonase  at night due to increased nasal congestion at night. No hemoptysis or blood in nasal discharge.  She requires a refill of Prilosec for her stomach but is otherwise up to date with her medications, which are managed by her pharmacy.  She is allergic to Bactrim and was advised against receiving the shingles vaccine due to this allergy.       Outpatient Medications Prior to Visit  Medication Sig Dispense Refill   albuterol  (VENTOLIN  HFA) 108 (90 Base) MCG/ACT inhaler Inhale 2 puffs into the lungs every 4 (four) hours as needed for wheezing or shortness of breath. 6.7 g 0   amLODipine  (NORVASC ) 2.5 MG tablet Take 1 tablet (2.5 mg total) by mouth daily. 90 tablet 2   aspirin  EC 81 MG tablet Take 1 tablet (81 mg total) by mouth daily. Swallow whole. 90 tablet 3   azelastine  (ASTELIN ) 0.1 % nasal spray Place 2 sprays into both nostrils 2 (two) times daily as  directed (Patient taking differently: Place 2 sprays into both nostrils daily.) 90 mL 1   carboxymethylcellulose (REFRESH TEARS) 0.5 % SOLN Place 1 drop into both eyes 3 (three) times daily as needed (Burning eyes).     Cholecalciferol  2000 units TABS Take 1 tablet (2,000 Units total) by mouth daily. 90 tablet 1   ezetimibe  (ZETIA ) 10 MG tablet Take 1 tablet (10 mg total) by mouth daily. 90 tablet 2   fluticasone  (FLONASE ) 50 MCG/ACT nasal spray USE 1 SPRAY IN EACH NOSTRIL EVERY DAY (Patient taking differently: Place 1 spray into both nostrils daily as needed for allergies or rhinitis.) 32 g 5   furosemide  (LASIX ) 20 MG tablet Take 1 tablet (20 mg total) by mouth daily. 90 tablet 3   irbesartan  (AVAPRO ) 150 MG tablet Take 0.5 tablets (75 mg total) by mouth in the morning and at bedtime. 90 tablet 3   isosorbide  mononitrate (IMDUR ) 30 MG 24 hr tablet Take 1 tablet (30 mg total) by mouth daily. 90 tablet 2   montelukast  (SINGULAIR ) 10 MG tablet Take 1 tablet (10 mg total) by mouth at bedtime. 90 tablet 1   nitroGLYCERIN  (NITROSTAT ) 0.4 MG SL tablet Place 1 tablet (0.4 mg total) under the tongue every 5 (five) minutes as needed for chest pain. 25 tablet 11   simvastatin  (ZOCOR ) 40 MG tablet Take 1 tablet (40 mg total) by mouth daily. 90 tablet 2   acetaminophen  (TYLENOL ) 325 MG tablet Take 1-2 tablets (325-650 mg  total) by mouth every 4 (four) hours as needed for mild pain.     cetirizine (ZYRTEC) 10 MG tablet Take 10 mg by mouth every morning. Alternate every 4 weeks with loratadine      doxycycline  (VIBRAMYCIN ) 100 MG capsule Take 1 capsule (100 mg total) by mouth 2 (two) times daily. 14 capsule 0   fluticasone -salmeterol (ADVAIR  DISKUS) 100-50 MCG/ACT AEPB Inhale 1 puff into the lungs every 12 (twelve) hours. 60 each 0   ketoconazole  (NIZORAL ) 2 % cream Apply 1 Application topically 2 (two) times daily. 60 g 8   loratadine  (CLARITIN ) 10 MG tablet Take 10 mg by mouth daily as needed for allergies.  Alternate with Zyrtec every 4 weeks     meclizine  (ANTIVERT ) 25 MG tablet Take 25 mg by mouth 3 (three) times daily as needed for dizziness. OTC     nystatin  (MYCOSTATIN ) 100000 UNIT/ML suspension Take 5 mLs (500,000 Units total) by mouth 4 (four) times daily. 60 mL 0   No facility-administered medications prior to visit.    ROS Review of Systems  Constitutional:  Negative for appetite change, chills, diaphoresis, fatigue and fever.  HENT: Negative.    Eyes: Negative.   Respiratory: Negative.  Negative for cough, chest tightness, shortness of breath and wheezing.   Cardiovascular:  Negative for chest pain, palpitations and leg swelling.  Gastrointestinal:  Negative for abdominal pain, constipation, diarrhea, nausea and vomiting.  Genitourinary: Negative.  Negative for difficulty urinating.  Musculoskeletal: Negative.  Negative for arthralgias, joint swelling and myalgias.  Skin: Negative.  Negative for color change and pallor.  Neurological:  Negative for dizziness and weakness.  Hematological:  Negative for adenopathy. Does not bruise/bleed easily.  Psychiatric/Behavioral: Negative.      Objective:  BP (!) 148/62 (BP Location: Left Arm, Patient Position: Sitting, Cuff Size: Normal)   Pulse 60   Temp 97.8 F (36.6 C) (Oral)   Resp 16   Ht 5\' 2"  (1.575 m)   Wt 168 lb 3.2 oz (76.3 kg)   SpO2 95%   BMI 30.76 kg/m   BP Readings from Last 3 Encounters:  07/07/23 (!) 148/62  06/21/23 (!) 89/58  05/21/23 122/66    Wt Readings from Last 3 Encounters:  07/07/23 168 lb 3.2 oz (76.3 kg)  07/02/23 168 lb (76.2 kg)  05/21/23 168 lb (76.2 kg)    Physical Exam Cardiovascular:     Rate and Rhythm: Normal rate and regular rhythm.     Heart sounds: Murmur heard.     Systolic murmur is present with a grade of 1/6.     No diastolic murmur is present.     No friction rub. No gallop.  Musculoskeletal:     Right lower leg: No edema.     Left lower leg: No edema.     Lab Results   Component Value Date   WBC 4.9 05/21/2023   HGB 11.5 05/21/2023   HCT 34.8 05/21/2023   PLT 221 05/21/2023   GLUCOSE 160 (H) 05/21/2023   CHOL 129 07/07/2023   TRIG 150.0 (H) 07/07/2023   HDL 49.00 07/07/2023   LDLDIRECT 76.0 06/24/2020   LDLCALC 50 07/07/2023   ALT 19 01/04/2023   AST 19 01/04/2023   NA 141 05/21/2023   K 4.7 05/21/2023   CL 104 05/21/2023   CREATININE 1.03 (H) 05/21/2023   BUN 16 05/21/2023   CO2 23 05/21/2023   TSH 5.91 (H) 07/07/2023   INR 1.0 01/04/2023   HGBA1C 7.3 (H) 07/07/2023  MICROALBUR 1.3 07/07/2023    DG Chest 2 View Result Date: 05/20/2023 CLINICAL DATA:  Worsening cough EXAM: CHEST - 2 VIEW COMPARISON:  Chest x-ray 04/29/2023 FINDINGS: 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 There is some patchy airspace opacities in the right perihilar region. The lungs are otherwise clear. There is no pleural effusion or pneumothorax. Cardiomediastinal silhouette is within normal limits. There are atherosclerotic calcifications of the aorta. Osseous structures are within normal limits. IMPRESSION: Patchy airspace opacities in the right perihilar region, concerning for pneumonia. Follow-up chest x-ray in 4-6 weeks is recommended to ensure resolution. Electronically Signed   By: Tyron Gallon M.D.   On: 05/20/2023 15:16    Assessment & Plan:   Type II diabetes mellitus with manifestations (HCC)- Blood sugar is well controlled. -     Urinalysis, Routine w reflex microscopic; Future -     Hemoglobin A1c; Future -     Microalbumin / creatinine urine ratio; Future -     HM Diabetes Foot Exam -     Empagliflozin ; Take 1 tablet (10 mg total) by mouth daily.  Dispense: 90 tablet; Refill: 0  Coronary artery disease involving native coronary artery of native heart with angina pectoris (HCC) -     Lipid panel; Future  Bilateral carotid artery stenosis -     Lipid panel; Future  Mixed hyperlipidemia- LDL goal  achieved. Doing well on the statin  -     Lipid panel; Future -     TSH; Future  Essential hypertension- BP is well controlled. -     TSH; Future  Encounter for general adult medical examination with abnormal findings- Exam completed, labs reviewed, vaccines reviewed, no cancer screenings indicated, pt ed material was given.   Gastroesophageal reflux disease without esophagitis -     Omeprazole ; Take 1 capsule (20 mg total) by mouth daily.  Dispense: 90 capsule; Refill: 1  Stage 3a chronic kidney disease (HCC)- Will avoid nephrotoxic agents  -     Empagliflozin ; Take 1 tablet (10 mg total) by mouth daily.  Dispense: 90 tablet; Refill: 0     Follow-up: Return in about 6 months (around 01/06/2024).  Sandra Crouch, MD

## 2023-07-07 NOTE — Patient Instructions (Signed)

## 2023-07-12 ENCOUNTER — Encounter: Payer: Self-pay | Admitting: Cardiovascular Disease

## 2023-07-12 ENCOUNTER — Ambulatory Visit: Payer: HMO | Attending: Internal Medicine | Admitting: Cardiovascular Disease

## 2023-07-12 VITALS — BP 110/66 | HR 61 | Ht 62.0 in | Wt 167.4 lb

## 2023-07-12 DIAGNOSIS — I442 Atrioventricular block, complete: Secondary | ICD-10-CM

## 2023-07-12 DIAGNOSIS — Z952 Presence of prosthetic heart valve: Secondary | ICD-10-CM

## 2023-07-12 DIAGNOSIS — E785 Hyperlipidemia, unspecified: Secondary | ICD-10-CM | POA: Diagnosis not present

## 2023-07-12 DIAGNOSIS — I10 Essential (primary) hypertension: Secondary | ICD-10-CM

## 2023-07-12 DIAGNOSIS — I5032 Chronic diastolic (congestive) heart failure: Secondary | ICD-10-CM | POA: Diagnosis not present

## 2023-07-12 NOTE — Assessment & Plan Note (Signed)
 Blood pressure is well-controlled and in fact may be overtreated.  Recommend stop amlodipine .  Continue low-dose furosemide , isosorbide , and irbesartan .

## 2023-07-12 NOTE — Patient Instructions (Addendum)
 Medications: STOP Amlodipine   Follow-Up: At [facility], you and your health needs are our priority.  As part of our continuing mission to provide you with exceptional heart care, our providers are all part of one team.  This team includes your primary Cardiologist (physician) and Advanced Practice Providers or APPs (Physician Assistants and Nurse Practitioners) who all work together to provide you with the care you need, when you need it.  Your next appointment:   As Scheduled  Provider:   Arnoldo Lapping, MD

## 2023-07-12 NOTE — Assessment & Plan Note (Signed)
 Appears clinically stable with NYHA functional class II symptoms.  No volume excess on exam.  No orthopnea or edema reported.

## 2023-07-12 NOTE — Progress Notes (Signed)
 Cardiology Office Note:    Date:  07/12/2023   ID:  Brindle, Leyba [birth year], MRN [id number]  PCP:  Arcadio Knuckles, MD    HeartCare Providers Cardiologist:  Arnoldo Lapping, MD Cardiology APP:  Gabino Joe, PA-C  Electrophysiologist:  Will Cortland Ding, MD     Referring MD: Arcadio Knuckles, MD   Chief Complaint  Patient presents with   Follow-up    S/P TAVR    History of Present Illness:    Heather Crawford is a 78 y.o. female with a complex cardiac history. See outline below:  Valvular heart disease, Rheumatic Fever as a child Aortic stenosis, s/p bioprosthetic AVR in 2012, then VIV-TAVR 2024 Cath 04/2021: mean AV 30 mmHg TTE 03/18/21: EF 60-65, no RWMA, Gr 1 DD, normal RVSF, normal PASP, no MR, AVR w no AI and mean 22 mmHg  TTE 03/31/22: EF 60-65, no RWMA, Gr 1 DD, NL RVSF, NL PASP, mild LAE, mild MR, trivial AI, bioprosthetic aortic stenosis (mean 30, Vmax 340 cm/s, DI 0.26) Mitral regurgitation Mild to mod MR on TTE in 2021; no MR in 2023; mild MR 03/2022 Coronary artery disease  S/p CABG x 1 in 2012 (RIMA-RCA) Myoview  3/21: No ischemia, EF 68; low risk>>Chest pain improved on nitrates  Cath 04/07/21: mRCA 100 CTO, mLCx 30, OM1 25, mLAD 40; patent R-RCA - med Rx  (HFpEF) heart failure with preserved ejection fraction  Echo 3/21: EF 65-70, GRII DD RHC 3/23: low normal R and L heart filling pressures  Carotid artery disease US  11/15/20: Bilateral ICA 1-39; L subcl stenosis  US  11/04/21: Bilat ICA 1-39; normal flow bilat subcl arteries  Left subclavian stenosis Noted 11/2020; normal flow on US  in 2023  Aortic atherosclerosis  LBBB Symptomatic bradycardia S/p Pacemaker in 12/21 Vertigo 2/2 labyrinthitis  GERD Impaired glucose tolerance  The patient is here with her husband today. She had a difficult time earlier this year with pneumonia and bronchitis, now finally over this. She states her strength is improving. Overall she feels her breathing  has improved since undergoing VIV-TAVR in December 2024. She denies chest pain, pressure, orthopnea, PND, or leg swelling.  The patient has occasional dizziness and has noted a few times where her systolic blood pressure has been running in the 90s.  Current Medications: Current Meds  Medication Sig   albuterol  (VENTOLIN  HFA) 108 (90 Base) MCG/ACT inhaler Inhale 2 puffs into the lungs every 4 (four) hours as needed for wheezing or shortness of breath.   aspirin  EC 81 MG tablet Take 1 tablet (81 mg total) by mouth daily. Swallow whole.   azelastine  (ASTELIN ) 0.1 % nasal spray Place 2 sprays into both nostrils 2 (two) times daily as directed (Patient taking differently: Place 2 sprays into both nostrils as needed.)   carboxymethylcellulose (REFRESH TEARS) 0.5 % SOLN Place 1 drop into both eyes 3 (three) times daily as needed (Burning eyes).   Cholecalciferol  2000 units TABS Take 1 tablet (2,000 Units total) by mouth daily.   ezetimibe  (ZETIA ) 10 MG tablet Take 1 tablet (10 mg total) by mouth daily.   fluticasone  (FLONASE ) 50 MCG/ACT nasal spray USE 1 SPRAY IN EACH NOSTRIL EVERY DAY (Patient taking differently: Place 1 spray into both nostrils daily as needed for allergies or rhinitis.)   furosemide  (LASIX ) 20 MG tablet Take 1 tablet (20 mg total) by mouth daily.   irbesartan  (AVAPRO ) 150 MG tablet Take 0.5 tablets (75 mg total) by mouth in the morning  and at bedtime.   isosorbide  mononitrate (IMDUR ) 30 MG 24 hr tablet Take 1 tablet (30 mg total) by mouth daily.   montelukast  (SINGULAIR ) 10 MG tablet Take 1 tablet (10 mg total) by mouth at bedtime.   nitroGLYCERIN  (NITROSTAT ) 0.4 MG SL tablet Place 1 tablet (0.4 mg total) under the tongue every 5 (five) minutes as needed for chest pain.   omeprazole  (PRILOSEC) 20 MG capsule Take 1 capsule (20 mg total) by mouth daily.   simvastatin  (ZOCOR ) 40 MG tablet Take 1 tablet (40 mg total) by mouth daily.   [DISCONTINUED] amLODipine  (NORVASC ) 2.5 MG tablet  Take 1 tablet (2.5 mg total) by mouth daily.   [DISCONTINUED] empagliflozin  (JARDIANCE ) 10 MG TABS tablet Take 1 tablet (10 mg total) by mouth daily.     Allergies:   Crestor  [rosuvastatin ], Lipitor [atorvastatin ], Sulfamethoxazole-trimethoprim, Tape, Amoxicillin, Celebrex  [celecoxib ], Neomycin, Neosporin [neomycin-bacitracin zn-polymyx], and Polysporin [bacitracin-polymyxin b]   ROS:   Please see the history of present illness.    All other systems reviewed and are negative.  EKGs/Labs/Other Studies Reviewed:    The following studies were reviewed today: Cardiac Studies & Procedures   ______________________________________________________________________________________________ CARDIAC CATHETERIZATION  CARDIAC CATHETERIZATION 11/26/2022  Conclusion 1.  Patent left main, LAD, and left circumflex, with calcification and mild nonobstructive plaquing stable from the previous cardiac catheterization study 2.  Chronic occlusion of the RCA with patent RIMA to RCA graft 3.  Known severe prosthetic aortic stenosis, aortic valve not crossed 4.  Mild pulmonary hypertension with PA pressure 41/24, mean 32 mmHg.  Transpulmonary gradient is 11 mmHg with a PVR of 2.5 Wood units.  Recommendations: Multidisciplinary heart team review of her case, formal cardiac surgical referral, anticipate valve in valve TAVR if anatomy is amenable. Continue outpatient loop diuretic and heart failure treatment  Findings Coronary Findings Diagnostic  Dominance: Right  Left Main Vessel is large. There is mild diffuse disease throughout the vessel. The left main is patent with no stenosis  Left Anterior Descending Vessel is large. The LAD is patent and wraps the apex.  The vessel has mild nonobstructive plaquing unchanged from the previous study Mid LAD lesion is 40% stenosed.  First Diagonal Branch Vessel is large in size.  Ramus Intermedius Vessel is moderate in size.  Left Circumflex Vessel is large.  The circumflex has nonobstructive plaquing in the mid vessel, no high-grade lesions noted. Mid Cx lesion is 30% stenosed.  First Obtuse Marginal Branch Vessel is large in size. The high OM ramus branch has no stenosis. 1st Mrg lesion is 25% stenosed.  Second Obtuse Marginal Branch Vessel is small in size.  Third Obtuse Marginal Branch Vessel is moderate in size.  Right Coronary Artery Vessel is moderate in size. The native RCA is 100% occluded.  The distal vessel fills from the RIMA graft. Ost RCA to Prox RCA lesion is 40% stenosed. Prox RCA to Mid RCA lesion is 100% stenosed. The lesion is chronically occluded. Dist RCA lesion is 90% stenosed.  Right Posterior Descending Artery Vessel is small in size.  Right Posterior Atrioventricular Artery Vessel is moderate in size. There is mild disease in the vessel.  First Right Posterolateral Branch Vessel is small in size.  Second Right Posterolateral Branch Vessel is moderate in size.  Third Right Posterolateral Branch Vessel is moderate in size.  RIMA RIMA Graft To Dist RCA RIMA and is normal in caliber.  The graft exhibits no disease. The RIMA to RCA graft remains patent with no significant stenosis  Intervention  No interventions have been documented.   CARDIAC CATHETERIZATION  CARDIAC CATHETERIZATION 04/07/2021  Conclusion Conclusions: Severe single-vessel coronary artery disease with chronic total occlusion of the mid RCA and 90% stenosis of the distal RCA (lesion is proximal to the RIMA anastomosis).  There is mild-moderate, nonobstructive disease involving the left coronary artery with up to 40% stenosis in the mid LAD. Widely patent RIMA-distal RCA. Low normal left and right heart filling pressures. Moderate stenosis of bioprosthetic aortic valve (mean gradient 30 mmHg, AVA 1.2 cm). Normal Fick cardiac output/index.  Recommendations: Continue medical therapy of chronic HFpEF and coronary artery disease with  stable angina. Ongoing surveillance of bioprosthetic aortic valve with moderately elevated gradient.  I do not believe this alone explains the patient's exertional dyspnea. Secondary prevention of coronary artery disease.  Sammy Crisp, MD [facility]  Findings Coronary Findings Diagnostic  Dominance: Right  Left Main Vessel is large. There is mild diffuse disease throughout the vessel.  Left Anterior Descending Vessel is large. Mid LAD lesion is 40% stenosed.  First Diagonal Branch Vessel is large in size.  Ramus Intermedius Vessel is moderate in size.  Left Circumflex Vessel is large. Mid Cx lesion is 30% stenosed.  First Obtuse Marginal Branch Vessel is large in size. 1st Mrg lesion is 25% stenosed.  Second Obtuse Marginal Branch Vessel is small in size.  Third Obtuse Marginal Branch Vessel is moderate in size.  Right Coronary Artery Vessel is moderate in size. Ost RCA to Prox RCA lesion is 40% stenosed. Prox RCA to Mid RCA lesion is 100% stenosed. The lesion is chronically occluded. Dist RCA lesion is 90% stenosed.  Right Posterior Descending Artery Vessel is small in size.  Right Posterior Atrioventricular Artery Vessel is moderate in size. There is mild disease in the vessel.  First Right Posterolateral Branch Vessel is small in size.  Second Right Posterolateral Branch Vessel is moderate in size.  Third Right Posterolateral Branch Vessel is moderate in size.  RIMA RIMA Graft To Dist RCA RIMA graft was visualized by angiography and is normal in caliber.  The graft exhibits no disease.  Intervention  No interventions have been documented.   STRESS TESTS  MYOCARDIAL PERFUSION IMAGING 05/03/2019  Narrative  The left ventricular ejection fraction is hyperdynamic (>65%).  Nuclear stress EF: 68%.  1 mm ST segment depressions in inferior leads in recovery, returning to baseline 4 minutes into recovery.  This is a overall low risk  study.  Defect 1: There is a small defect of mild severity present in the apical anterior, apical septal and apex location.  Small fixed perfusion defect in the apex, anteroapex and apical septum of mild severity with no reversibility. Compared to prior study, a similar distribution is noted however no ischemia is seen on today's study. Wall motion mildly hypokinetic in anteroapex and apical septum.  Findings consistent with infarction.   ECHOCARDIOGRAM  ECHOCARDIOGRAM COMPLETE 02/05/2023  Narrative ECHOCARDIOGRAM REPORT    Patient Name:   Heather Crawford Date of Exam: 02/05/2023 Medical Rec #:  [id number]          Height:       62.0 in Accession #:    [id number]         Weight:       163.0 lb Date of Birth:  [birth year]          BSA:          1.753 m Patient Age:    44 years  BP:           134/58 mmHg Patient Gender: F                  HR:           74 bpm. Exam Location:  [address]  Procedure: 2D Echo, Cardiac Doppler, Color Doppler, 3D Echo and Strain Analysis  Indications:    Z95.2 Post TAVR evaluation  History:        Patient has prior history of Echocardiogram examinations, most recent 01/06/2023. CHF, CAD, Pacemaker; Risk Factors:Hypertension, Diabetes and Dyslipidemia. Aortic Valve: 23 mm Edwards Sapien 3 Ultra Resilia valve is present in the aortic position.  Sonographer:    Mylinda Asa RCS Referring Phys: Arcadio Knuckles  IMPRESSIONS   1. Left ventricular ejection fraction, by estimation, is 55 to 60%. The left ventricle has normal function. The left ventricle has no regional wall motion abnormalities. There is moderate left ventricular hypertrophy. Left ventricular diastolic parameters are consistent with Grade I diastolic dysfunction (impaired relaxation). Elevated left ventricular end-diastolic pressure. The average left ventricular global longitudinal strain is -16.5 %. The global longitudinal strain is abnormal. 2. Right ventricular systolic  function is normal. The right ventricular size is normal. There is normal pulmonary artery systolic pressure. 3. The mitral valve is degenerative. Trivial mitral valve regurgitation. No evidence of mitral stenosis. The mean mitral valve gradient is 3.2 mmHg with average heart rate of 61 bpm. 4. The aortic valve is normal in structure. Aortic valve regurgitation is not visualized. No aortic stenosis is present. There is a 23 mm Edwards Sapien 3 Ultra Resilia valve present in the aortic position. Echo findings are consistent with normal structure and function of the aortic valve prosthesis. Aortic valve mean gradient measures 9.0 mmHg. Aortic valve Vmax measures 2.05 m/s. 5. The inferior vena cava is normal in size with greater than 50% respiratory variability, suggesting right atrial pressure of 3 mmHg.  FINDINGS Left Ventricle: Left ventricular ejection fraction, by estimation, is 55 to 60%. The left ventricle has normal function. The left ventricle has no regional wall motion abnormalities. The average left ventricular global longitudinal strain is -16.5 %. The global longitudinal strain is abnormal. The left ventricular internal cavity size was normal in size. There is moderate left ventricular hypertrophy. Left ventricular diastolic parameters are consistent with Grade I diastolic dysfunction (impaired relaxation). Elevated left ventricular end-diastolic pressure.  Right Ventricle: The right ventricular size is normal. No increase in right ventricular wall thickness. Right ventricular systolic function is normal. There is normal pulmonary artery systolic pressure. The tricuspid regurgitant velocity is 2.35 m/s, and with an assumed right atrial pressure of 3 mmHg, the estimated right ventricular systolic pressure is 25.1 mmHg.  Left Atrium: Left atrial size was normal in size.  Right Atrium: Right atrial size was normal in size.  Pericardium: There is no evidence of pericardial  effusion.  Mitral Valve: The mitral valve is degenerative in appearance. There is severe thickening of the posterior mitral valve leaflet(s). There is moderate calcification of the posterior mitral valve leaflet(s). Trivial mitral valve regurgitation. No evidence of mitral valve stenosis. The mean mitral valve gradient is 3.2 mmHg with average heart rate of 61 bpm.  Tricuspid Valve: The tricuspid valve is normal in structure. Tricuspid valve regurgitation is trivial. No evidence of tricuspid stenosis.  Aortic Valve: The aortic valve is normal in structure. Aortic valve regurgitation is not visualized. No aortic stenosis is present. Aortic valve mean gradient measures 9.0  mmHg. Aortic valve peak gradient measures 16.8 mmHg. There is a 23 mm Edwards Sapien 3 Ultra Resilia valve present in the aortic position. Echo findings are consistent with normal structure and function of the aortic valve prosthesis.  Pulmonic Valve: The pulmonic valve was normal in structure. Pulmonic valve regurgitation is not visualized. No evidence of pulmonic stenosis.  Aorta: The aortic root is normal in size and structure.  Venous: The inferior vena cava is normal in size with greater than 50% respiratory variability, suggesting right atrial pressure of 3 mmHg.  IAS/Shunts: No atrial level shunt detected by color flow Doppler.  Additional Comments: A device lead is visualized.   LEFT VENTRICLE PLAX 2D LVIDd:         4.50 cm Diastology LVIDs:         2.90 cm LV e' medial:    5.00 cm/s LV PW:         1.10 cm LV E/e' medial:  22.0 LV IVS:        1.40 cm LV e' lateral:   6.85 cm/s LV E/e' lateral: 16.1  2D Longitudinal Strain 2D Strain GLS (A2C):   -16.6 % 2D Strain GLS (A3C):   -18.6 % 2D Strain GLS (A4C):   -14.4 % 2D Strain GLS Avg:     -16.5 %  3D Volume EF: 3D EF:        56 % LV EDV:       87 ml LV ESV:       38 ml LV SV:        49 ml  RIGHT VENTRICLE RV Basal diam:  2.80 cm RV S prime:     12.50  cm/s TAPSE (M-mode): 1.6 cm RVSP:           25.1 mmHg  LEFT ATRIUM             Index        RIGHT ATRIUM           Index LA diam:        4.20 cm 2.40 cm/m   RA Pressure: 3.00 mmHg LA Vol (A2C):   45.5 ml 25.96 ml/m  RA Area:     8.70 cm LA Vol (A4C):   48.8 ml 27.85 ml/m  RA Volume:   15.50 ml  8.84 ml/m LA Biplane Vol: 47.2 ml 26.93 ml/m AORTIC VALVE AV Vmax:           205.00 cm/s AV Vmean:          140.000 cm/s AV VTI:            0.436 m AV Peak Grad:      16.8 mmHg AV Mean Grad:      9.0 mmHg LVOT Vmax:         91.80 cm/s LVOT Vmean:        59.700 cm/s LVOT VTI:          0.216 m LVOT/AV VTI ratio: 0.50  AORTA Ao Asc diam: 3.60 cm  MITRAL VALVE                TRICUSPID VALVE MV Area (PHT): 2.58 cm     TR Peak grad:   22.1 mmHg MV Mean grad:  3.2 mmHg     TR Vmax:        235.00 cm/s MV Decel Time: 294 msec     Estimated RAP:  3.00 mmHg MV E velocity: 110.00 cm/s  RVSP:  25.1 mmHg MV A velocity: 154.00 cm/s MV E/A ratio:  0.71         SHUNTS Systemic VTI: 0.22 m  Maudine Sos MD Electronically signed by Maudine Sos MD Signature Date/Time: 02/05/2023/6:29:53 PM    Final    MONITORS  CARDIAC EVENT MONITOR 11/15/2019   CT SCANS  CT CORONARY MORPH W/CTA COR W/SCORE 10/30/2022  Addendum 11/02/2022  5:05 PM ADDENDUM REPORT: 11/02/2022 17:02  CLINICAL DATA:  55F with severe bioprosthetic aortic valve stenosis being evaluated for a valve in valve procedure.  EXAM: Cardiac TAVR CT  TECHNIQUE: The patient was scanned on a [employer]. A 120 kV retrospective scan was triggered in the descending thoracic aorta at 111 HU's. Gantry rotation speed was 250 msecs and collimation was .6 mm. No beta blockade or nitro were given. The 3D data set was reconstructed in 5% intervals of the R-R cycle. Systolic and diastolic phases were analyzed on a dedicated work station using MPR, MIP and VRT modes. The patient received 100 cc of  contrast.  FINDINGS: Aortic valve: There is a 21mm Magna Ease valve in the aortic position. Either a 20mm or 23mm Sapien 3 valve would be options for valve in valve THV implantation. For 23mm Sapien 3, the virtual THV to coronary (VTC) distance measures 7mm to left main and 1mm to RCA. For 20mm Sapien 3, the VTC distance measures 8mm to left main and 2mm to RCA. This suggests elevated risk of RCA obstruction with valve in valve procedure for both 23mm and 20mm Sapien 3 valve  Right atrium: Normal size  Right ventricle: Mild dilatation  Pulmonary arteries: Normal size  Pulmonary veins: Normal configuration  Left atrium: Mild enlargement  Left ventricle: Normal size  Pericardium: Normal thickness  Coronary arteries: Normal origins. Calcium  score 2516 (98th percentile). Patent RIMA-RCA  IMPRESSION: 1. There is a 21mm Magna Ease valve in the aortic position. Either a 20mm or 23mm Sapien 3 valve would be options for valve in valve THV implantation. For 23mm Sapien 3, the virtual THV to coronary (VTC) distance measures 7mm to left main and 1mm to RCA. For 20mm Sapien 3, the VTC distance measures 8mm to left main and 2mm to RCA. This suggests elevated risk of RCA obstruction with valve in valve procedure for both 23mm and 20mm Sapien 3 valve  2.  Coronary calcium  score 2516 (98th percentile).  Patent RIMA-RCA   Electronically Signed By: Carson Clara M.D. On: 11/02/2022 17:02  Narrative EXAM: OVER-READ INTERPRETATION  CT CHEST  The following report is a limited chest CT over-read performed by radiologist Dr. Karlyn Overman of [facility] on 10/30/2022. This over-read does not include interpretation of cardiac or coronary anatomy or pathology. The cardiac CTA interpretation by the cardiologist is attached.  COMPARISON:  06/02/2022 chest radiograph.  FINDINGS: Please see the separate concurrent chest CT angiogram report  for details.  IMPRESSION: Please see the separate concurrent chest CT angiogram report for details.  Electronically Signed: By: Levell Reach M.D. On: 11/02/2022 08:33     ______________________________________________________________________________________________      EKG:        Recent Labs: 01/04/2023: ALT 19 01/06/2023: Magnesium  1.6 05/21/2023: BUN 16; Creatinine, Ser 1.03; Hemoglobin 11.5; NT-Pro BNP 442; Platelets 221; Potassium 4.7; Sodium 141 07/07/2023: TSH 5.91  Recent Lipid Panel    Component Value Date/Time   CHOL 129 07/07/2023 0900   CHOL 149 10/07/2021 1124   TRIG 150.0 (H) 07/07/2023 0900   HDL 49.00  07/07/2023 0900   HDL 50 10/07/2021 1124   CHOLHDL 3 07/07/2023 0900   VLDL 30.0 07/07/2023 0900   LDLCALC 50 07/07/2023 0900   LDLCALC 68 10/07/2021 1124   LDLDIRECT 76.0 06/24/2020 1346     Risk Assessment/Calculations:                Physical Exam:    VS:  BP 110/66   Pulse 61   Ht 5\' 2"  (1.575 m)   Wt 167 lb 6.4 oz (75.9 kg)   SpO2 96%   BMI 30.62 kg/m     Wt Readings from Last 3 Encounters:  07/12/23 167 lb 6.4 oz (75.9 kg)  07/07/23 168 lb 3.2 oz (76.3 kg)  07/02/23 168 lb (76.2 kg)     GEN:  Well nourished, well developed in no acute distress HEENT: Normal NECK: No JVD; No carotid bruits LYMPHATICS: No lymphadenopathy CARDIAC: RRR, 1/6 systolic murmur right upper sternal border, early peaking RESPIRATORY:  Clear to auscultation without rales, wheezing or rhonchi  ABDOMEN: Soft, non-tender, non-distended MUSCULOSKELETAL:  No edema; No deformity  SKIN: Warm and dry NEUROLOGIC:  Alert and oriented x 3 PSYCHIATRIC:  Normal affect   Assessment & Plan S/P VIV TAVR (transcatheter aortic valve replacement) Patient appears clinically stable.  Scheduled for her 1 year echo and structural heart APP office visit in December.  She is on aspirin  for antiplatelet therapy.  She follows SBE prophylaxis when indicated. Essential  hypertension Blood pressure is well-controlled and in fact may be overtreated.  Recommend stop amlodipine .  Continue low-dose furosemide , isosorbide , and irbesartan . 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  Continue current management on simvastatin  and Zetia  with history of myalgias on rosuvastatin  and atorvastatin . Chronic heart failure with preserved ejection fraction (HCC) Appears clinically stable with NYHA functional class II symptoms.  No volume excess on exam.  No orthopnea or edema reported.      Medication Adjustments/Labs and Tests Ordered: Current medicines are reviewed at length with the patient today.  Concerns regarding medicines are outlined above.  No orders of the defined types were placed in this encounter.  No orders of the defined types were placed in this encounter.   Patient Instructions  Medications: STOP Amlodipine   Follow-Up: At [facility], you and your health needs are our priority.  As part of our continuing mission to provide you with exceptional heart care, our providers are all part of one team.  This team includes your primary Cardiologist (physician) and Advanced Practice Providers or APPs (Physician Assistants and Nurse Practitioners) who all work together to provide you with the care you need, when you need it.  Your next appointment:   As Scheduled  Provider:   Arnoldo Lapping, MD       Signed, Arnoldo Lapping, MD  07/12/2023 1:20 PM    [facility]

## 2023-07-12 NOTE — Assessment & Plan Note (Signed)
 Patient appears clinically stable.  Scheduled for her 1 year echo and structural heart APP office visit in December.  She is on aspirin  for antiplatelet therapy.  She follows SBE prophylaxis when indicated.

## 2023-07-15 ENCOUNTER — Ambulatory Visit (INDEPENDENT_AMBULATORY_CARE_PROVIDER_SITE_OTHER): Payer: Medicare HMO

## 2023-07-15 DIAGNOSIS — I442 Atrioventricular block, complete: Secondary | ICD-10-CM | POA: Diagnosis not present

## 2023-07-20 ENCOUNTER — Ambulatory Visit: Payer: Self-pay | Admitting: Cardiology

## 2023-07-27 ENCOUNTER — Other Ambulatory Visit (HOSPITAL_COMMUNITY): Payer: Self-pay

## 2023-07-27 DIAGNOSIS — H25013 Cortical age-related cataract, bilateral: Secondary | ICD-10-CM | POA: Diagnosis not present

## 2023-07-27 DIAGNOSIS — H2513 Age-related nuclear cataract, bilateral: Secondary | ICD-10-CM | POA: Diagnosis not present

## 2023-07-27 DIAGNOSIS — H18413 Arcus senilis, bilateral: Secondary | ICD-10-CM | POA: Diagnosis not present

## 2023-07-27 DIAGNOSIS — H25043 Posterior subcapsular polar age-related cataract, bilateral: Secondary | ICD-10-CM | POA: Diagnosis not present

## 2023-07-27 DIAGNOSIS — H2511 Age-related nuclear cataract, right eye: Secondary | ICD-10-CM | POA: Diagnosis not present

## 2023-07-27 MED ORDER — GATIFLOXACIN 0.5 % OP SOLN
1.0000 [drp] | Freq: Four times a day (QID) | OPHTHALMIC | 1 refills | Status: DC
  Filled 2023-07-27: qty 5, 25d supply, fill #0

## 2023-07-27 MED ORDER — PREDNISOLONE ACETATE 1 % OP SUSP
1.0000 [drp] | Freq: Four times a day (QID) | OPHTHALMIC | 1 refills | Status: DC
  Filled 2023-07-27: qty 5, 25d supply, fill #0

## 2023-07-27 MED ORDER — KETOROLAC TROMETHAMINE 0.5 % OP SOLN
1.0000 [drp] | Freq: Four times a day (QID) | OPHTHALMIC | 1 refills | Status: DC
  Filled 2023-07-27: qty 10, 50d supply, fill #0

## 2023-07-28 ENCOUNTER — Other Ambulatory Visit: Payer: Self-pay

## 2023-07-29 ENCOUNTER — Encounter: Payer: Self-pay | Admitting: Pharmacist

## 2023-07-29 ENCOUNTER — Other Ambulatory Visit: Payer: Self-pay

## 2023-07-29 ENCOUNTER — Other Ambulatory Visit (HOSPITAL_COMMUNITY): Payer: Self-pay

## 2023-07-30 ENCOUNTER — Other Ambulatory Visit: Payer: Self-pay | Admitting: Physician Assistant

## 2023-07-30 DIAGNOSIS — E118 Type 2 diabetes mellitus with unspecified complications: Secondary | ICD-10-CM

## 2023-07-30 DIAGNOSIS — I10 Essential (primary) hypertension: Secondary | ICD-10-CM

## 2023-07-31 ENCOUNTER — Other Ambulatory Visit (HOSPITAL_COMMUNITY): Payer: Self-pay

## 2023-08-02 ENCOUNTER — Other Ambulatory Visit: Payer: Self-pay

## 2023-08-02 ENCOUNTER — Other Ambulatory Visit (HOSPITAL_COMMUNITY): Payer: Self-pay

## 2023-08-02 MED ORDER — IRBESARTAN 150 MG PO TABS
75.0000 mg | ORAL_TABLET | Freq: Two times a day (BID) | ORAL | 3 refills | Status: AC
  Filled 2023-08-02: qty 90, 90d supply, fill #0
  Filled 2023-08-26 – 2023-11-08 (×4): qty 90, 90d supply, fill #1
  Filled 2023-12-30 – 2024-02-07 (×2): qty 90, 90d supply, fill #2

## 2023-08-18 DIAGNOSIS — H2511 Age-related nuclear cataract, right eye: Secondary | ICD-10-CM | POA: Diagnosis not present

## 2023-08-19 DIAGNOSIS — H2512 Age-related nuclear cataract, left eye: Secondary | ICD-10-CM | POA: Diagnosis not present

## 2023-08-27 ENCOUNTER — Other Ambulatory Visit (HOSPITAL_COMMUNITY): Payer: Self-pay

## 2023-08-31 ENCOUNTER — Ambulatory Visit (HOSPITAL_COMMUNITY)
Admission: RE | Admit: 2023-08-31 | Discharge: 2023-08-31 | Disposition: A | Discharge status: Home / Self Care | Source: Ambulatory Visit | Attending: Physician Assistant | Admitting: Physician Assistant

## 2023-08-31 DIAGNOSIS — I6523 Occlusion and stenosis of bilateral carotid arteries: Secondary | ICD-10-CM | POA: Diagnosis not present

## 2023-08-31 DIAGNOSIS — I779 Disorder of arteries and arterioles, unspecified: Secondary | ICD-10-CM | POA: Diagnosis not present

## 2023-09-01 ENCOUNTER — Ambulatory Visit: Payer: Self-pay | Admitting: Physician Assistant

## 2023-09-01 DIAGNOSIS — H2512 Age-related nuclear cataract, left eye: Secondary | ICD-10-CM | POA: Diagnosis not present

## 2023-09-01 DIAGNOSIS — I779 Disorder of arteries and arterioles, unspecified: Secondary | ICD-10-CM

## 2023-09-01 DIAGNOSIS — I771 Stricture of artery: Secondary | ICD-10-CM

## 2023-09-01 DIAGNOSIS — H2511 Age-related nuclear cataract, right eye: Secondary | ICD-10-CM | POA: Diagnosis not present

## 2023-09-08 NOTE — Addendum Note (Signed)
 Addended by: VICCI SELLER A on: 09/08/2023 08:50 AM   Modules accepted: Orders

## 2023-09-08 NOTE — Progress Notes (Signed)
 Remote pacemaker transmission.

## 2023-09-29 DIAGNOSIS — Z1231 Encounter for screening mammogram for malignant neoplasm of breast: Secondary | ICD-10-CM | POA: Diagnosis not present

## 2023-09-29 DIAGNOSIS — M8589 Other specified disorders of bone density and structure, multiple sites: Secondary | ICD-10-CM | POA: Diagnosis not present

## 2023-09-29 LAB — HM DEXA SCAN

## 2023-09-29 LAB — HM MAMMOGRAPHY

## 2023-10-01 ENCOUNTER — Encounter: Payer: Self-pay | Admitting: Internal Medicine

## 2023-10-04 ENCOUNTER — Other Ambulatory Visit: Payer: Self-pay

## 2023-10-05 ENCOUNTER — Other Ambulatory Visit: Payer: Self-pay

## 2023-10-14 ENCOUNTER — Ambulatory Visit (INDEPENDENT_AMBULATORY_CARE_PROVIDER_SITE_OTHER): Payer: Medicare HMO

## 2023-10-14 DIAGNOSIS — I442 Atrioventricular block, complete: Secondary | ICD-10-CM

## 2023-10-15 LAB — CUP PACEART REMOTE DEVICE CHECK
Battery Remaining Longevity: 137 mo
Battery Voltage: 3.02 V
Brady Statistic AP VP Percent: 0.02 %
Brady Statistic AP VS Percent: 17.76 %
Brady Statistic AS VP Percent: 0.05 %
Brady Statistic AS VS Percent: 82.17 %
Brady Statistic RA Percent Paced: 17.83 %
Brady Statistic RV Percent Paced: 0.07 %
Date Time Interrogation Session: 20250911132449
Implantable Lead Connection Status: 753985
Implantable Lead Connection Status: 753985
Implantable Lead Implant Date: 20211215
Implantable Lead Implant Date: 20211215
Implantable Lead Location: 753859
Implantable Lead Location: 753860
Implantable Lead Model: 5076
Implantable Lead Model: 5076
Implantable Pulse Generator Implant Date: 20211215
Lead Channel Impedance Value: 304 Ohm
Lead Channel Impedance Value: 342 Ohm
Lead Channel Impedance Value: 361 Ohm
Lead Channel Impedance Value: 418 Ohm
Lead Channel Pacing Threshold Amplitude: 0.5 V
Lead Channel Pacing Threshold Amplitude: 0.625 V
Lead Channel Pacing Threshold Pulse Width: 0.4 ms
Lead Channel Pacing Threshold Pulse Width: 0.4 ms
Lead Channel Sensing Intrinsic Amplitude: 0.75 mV
Lead Channel Sensing Intrinsic Amplitude: 0.75 mV
Lead Channel Sensing Intrinsic Amplitude: 10.125 mV
Lead Channel Sensing Intrinsic Amplitude: 10.125 mV
Lead Channel Setting Pacing Amplitude: 1.5 V
Lead Channel Setting Pacing Amplitude: 2 V
Lead Channel Setting Pacing Pulse Width: 0.4 ms
Lead Channel Setting Sensing Sensitivity: 1.2 mV
Zone Setting Status: 755011

## 2023-10-19 ENCOUNTER — Ambulatory Visit: Payer: Self-pay | Admitting: Cardiology

## 2023-10-19 ENCOUNTER — Ambulatory Visit: Attending: Student | Admitting: Cardiology

## 2023-10-19 ENCOUNTER — Telehealth: Payer: Self-pay | Admitting: Cardiovascular Disease

## 2023-10-19 ENCOUNTER — Encounter: Payer: Self-pay | Admitting: Student

## 2023-10-19 VITALS — BP 138/70 | HR 73 | Ht 62.0 in | Wt 167.6 lb

## 2023-10-19 DIAGNOSIS — R918 Other nonspecific abnormal finding of lung field: Secondary | ICD-10-CM

## 2023-10-19 DIAGNOSIS — I10 Essential (primary) hypertension: Secondary | ICD-10-CM

## 2023-10-19 DIAGNOSIS — Z950 Presence of cardiac pacemaker: Secondary | ICD-10-CM | POA: Diagnosis not present

## 2023-10-19 DIAGNOSIS — I38 Endocarditis, valve unspecified: Secondary | ICD-10-CM

## 2023-10-19 DIAGNOSIS — J309 Allergic rhinitis, unspecified: Secondary | ICD-10-CM

## 2023-10-19 NOTE — Telephone Encounter (Signed)
 Spoke with pt regarding a CT scan. Pt stated she was told by Dr. Wonda and Izetta Hummer a year ago that she had a spot on her lung that needed to be evaluated. Pt has been unable to schedule the follow up CT because there is no order. Pt was currently at an appointment with Jodie Passey, PA-C who stated he would take care of ordering the CT. Pt verbalized understanding. All questions if any were answered.

## 2023-10-19 NOTE — Patient Instructions (Signed)
 Medication Instructions:  Your physician recommends that you continue on your current medications as directed. Please refer to the Current Medication list given to you today.  *If you need a refill on your cardiac medications before your next appointment, please call your pharmacy*  Lab Work: None ordered If you have labs (blood work) drawn today and your tests are completely normal, you will receive your results only by: MyChart Message (if you have MyChart) OR A paper copy in the mail If you have any lab test that is abnormal or we need to change your treatment, we will call you to review the results.  Testing/Procedures: Your provider has recommended that you have a non contrast CT of your chest.  Follow-Up: At [facility], you and your health needs are our priority.  As part of our continuing mission to provide you with exceptional heart care, our providers are all part of one team.  This team includes your primary Cardiologist (physician) and Advanced Practice Providers or APPs (Physician Assistants and Nurse Practitioners) who all work together to provide you with the care you need, when you need it.  Your next appointment:   1 year(s)  Provider:   Soyla Norton, MD

## 2023-10-19 NOTE — Telephone Encounter (Signed)
 Pt sent this via my chart to the scheduling pool:    Dr Wonda shared with my mom that she had a spot on her lung that needed to be evaluated. This was found during a heart scan prior to her last surgery. She still hasn't had the CT scheduled. Are you able to help with this? She is high risk As she smoked for many years. She has an upcoming appointment with you so I wanted to inquire. She is out of town 9/29-10/5 Thank you LaRaye Rudicile (Daughter)  Pt stated that she is wanting to know if Dr Wonda would order an other CT Scan   Best number [phone]

## 2023-10-19 NOTE — Progress Notes (Signed)
  Electrophysiology Office Note:   ID:  Heather, Crawford September 26, 1945, MRN [id number]  Primary Cardiologist: Ozell Fell, MD Electrophysiologist: Soyla Gladis Norton, MD      History of Present Illness:   Heather Crawford is a 78 y.o. female with h/o aortic stenosis (s/p bioprosthetic AVR in 2012, VIV-TAVR 2024), hypertension, coronary artery disease, heart block seen today for routine electrophysiology followup.   Since last being seen in our clinic the patient reports doing okay. Earlier this year she had pneumonia and bronchitis, has since fully recovered. Since her last EP visit, patient also underwent VIV TAVR (12/24). Patient expresses frustration today regarding difficulty scheduling follow up lung CT that was recommended last year after pre-TAVR imaging noted pulmonary nodules.   she denies chest pain, palpitations, dyspnea, PND, nausea, vomiting, dizziness, syncope, edema, or early satiety. Reports fluctuant LE edema, attributes to occasional high salt meals and denies orthopnea.   Review of systems complete and found to be negative unless listed in HPI.   EP Information / Studies Reviewed:    EKG is not ordered today. EKG from 05/21/23 reviewed which showed sinus rhythm HR 78 with known LBBB. Lateral TWI noted.       PPM Interrogation-  reviewed in detail today,  See PACEART report.  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 Will have 1 year echo this December.  Essential hypertension Last general cardiology OV, Amlodipine  stopped due to occasional dizziness and low systolic BP at home. BP today 150/20mmHg, 138/70 mmHg on recheck. Patient reports home readings are typically ~120 systolic with intermittent low readings. Suspect elevated BP due to stress regarding CT scan. Continue low dose Lasix , irbesartan , Imdur . Encouraged regular monitoring at home.   CAD Hyperlipidemia S/p CABG 2012. Cholesterol well controlled with LDL 50, total 129. Continue Simvastatin  and Zetia  per general cardiology.  Pulmonary nodules On 10/30/23 chest CTA, small solid pulmonary nodules in the right middle lobe along the minor fissure, largest 0.3 cm. Repeat screening CT ordered.    Disposition:   Follow up with Dr. Norton in 12 months  Signed, Artist Pouch, PA-C

## 2023-10-21 NOTE — Progress Notes (Signed)
 Remote PPM Transmission

## 2023-10-26 ENCOUNTER — Ambulatory Visit (HOSPITAL_COMMUNITY)
Admission: RE | Admit: 2023-10-26 | Discharge: 2023-10-26 | Disposition: A | Discharge status: Home / Self Care | Source: Ambulatory Visit | Attending: Internal Medicine | Admitting: Internal Medicine

## 2023-10-26 DIAGNOSIS — R918 Other nonspecific abnormal finding of lung field: Secondary | ICD-10-CM | POA: Insufficient documentation

## 2023-10-26 DIAGNOSIS — I2510 Atherosclerotic heart disease of native coronary artery without angina pectoris: Secondary | ICD-10-CM | POA: Diagnosis not present

## 2023-10-26 DIAGNOSIS — J439 Emphysema, unspecified: Secondary | ICD-10-CM | POA: Insufficient documentation

## 2023-10-26 DIAGNOSIS — Z87891 Personal history of nicotine dependence: Secondary | ICD-10-CM | POA: Insufficient documentation

## 2023-10-26 DIAGNOSIS — I700 Atherosclerosis of aorta: Secondary | ICD-10-CM | POA: Insufficient documentation

## 2023-10-31 ENCOUNTER — Other Ambulatory Visit: Payer: Self-pay | Admitting: Family

## 2023-10-31 DIAGNOSIS — J301 Allergic rhinitis due to pollen: Secondary | ICD-10-CM

## 2023-11-01 ENCOUNTER — Other Ambulatory Visit (HOSPITAL_COMMUNITY): Payer: Self-pay

## 2023-11-03 ENCOUNTER — Other Ambulatory Visit: Payer: Self-pay

## 2023-11-03 ENCOUNTER — Other Ambulatory Visit (HOSPITAL_COMMUNITY): Payer: Self-pay

## 2023-11-04 ENCOUNTER — Other Ambulatory Visit (HOSPITAL_COMMUNITY): Payer: Self-pay

## 2023-11-13 ENCOUNTER — Ambulatory Visit: Payer: Self-pay | Admitting: Cardiology

## 2023-11-29 ENCOUNTER — Other Ambulatory Visit (HOSPITAL_COMMUNITY): Payer: Self-pay

## 2023-12-15 ENCOUNTER — Other Ambulatory Visit: Payer: Self-pay | Admitting: Family

## 2023-12-15 DIAGNOSIS — R058 Other specified cough: Secondary | ICD-10-CM

## 2023-12-15 DIAGNOSIS — J301 Allergic rhinitis due to pollen: Secondary | ICD-10-CM

## 2023-12-20 ENCOUNTER — Encounter: Payer: Self-pay | Admitting: Dermatology

## 2023-12-20 ENCOUNTER — Ambulatory Visit: Admitting: Dermatology

## 2023-12-20 VITALS — BP 107/66 | HR 66

## 2023-12-20 DIAGNOSIS — L814 Other melanin hyperpigmentation: Secondary | ICD-10-CM | POA: Diagnosis not present

## 2023-12-20 DIAGNOSIS — Z85828 Personal history of other malignant neoplasm of skin: Secondary | ICD-10-CM

## 2023-12-20 DIAGNOSIS — L578 Other skin changes due to chronic exposure to nonionizing radiation: Secondary | ICD-10-CM | POA: Diagnosis not present

## 2023-12-20 DIAGNOSIS — D1801 Hemangioma of skin and subcutaneous tissue: Secondary | ICD-10-CM | POA: Diagnosis not present

## 2023-12-20 DIAGNOSIS — L570 Actinic keratosis: Secondary | ICD-10-CM

## 2023-12-20 DIAGNOSIS — L304 Erythema intertrigo: Secondary | ICD-10-CM | POA: Diagnosis not present

## 2023-12-20 DIAGNOSIS — L821 Other seborrheic keratosis: Secondary | ICD-10-CM | POA: Diagnosis not present

## 2023-12-20 DIAGNOSIS — Z1283 Encounter for screening for malignant neoplasm of skin: Secondary | ICD-10-CM | POA: Diagnosis not present

## 2023-12-20 DIAGNOSIS — W908XXA Exposure to other nonionizing radiation, initial encounter: Secondary | ICD-10-CM

## 2023-12-20 DIAGNOSIS — D229 Melanocytic nevi, unspecified: Secondary | ICD-10-CM

## 2023-12-20 NOTE — Patient Instructions (Addendum)

## 2023-12-20 NOTE — Progress Notes (Signed)
 Total Body Skin Exam (TBSE) Visit   Subjective  Heather Crawford is a 78 y.o. female who presents for the following: Skin Cancer Screening and Full Body Skin Exam  Patient presents today for follow up visit for TBSE. Patient was last evaluated on 06/21/2023 . Patient denies medication changes. Patient reports she does not have spots, moles and lesions of concern to be evaluated. Patient reports throughout her lifetime she has had moderate sun exposure. Currently, patient reports if she has excessive sun exposure, she does apply sunscreen and/or wears protective coverings. 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 Patient reports  family history of skin cancers.   The following portions of the chart were reviewed this encounter and updated as appropriate: medications, allergies, medical history  Review of Systems:  No other skin or systemic complaints except as noted in HPI or Assessment and Plan.  Objective  Well appearing patient in no apparent distress; mood and affect are within normal limits.  A full examination was performed including scalp, head, eyes, ears, nose, lips, neck, chest, axillae, abdomen, back, buttocks, bilateral upper extremities, bilateral lower extremities, hands, feet, fingers, toes, fingernails, and toenails. All findings within normal limits unless otherwise noted below.   Relevant physical exam findings are noted in the Assessment and Plan.  Right Buccal Cheek Erythematous thin papules/macules with gritty scale.   Assessment & Plan   LENTIGINES, SEBORRHEIC KERATOSES, HEMANGIOMAS - Benign normal skin lesions - Benign-appearing - Call for any changes  MELANOCYTIC NEVI - Tan-brown and/or pink-flesh-colored symmetric macules and papules - Benign appearing on exam today - Observation - Call clinic for new or changing moles - Recommend daily use of broad spectrum spf 30+ sunscreen to sun-exposed areas.   ACTINIC DAMAGE - Chronic  condition, secondary to cumulative UV/sun exposure - diffuse scaly erythematous macules with underlying dyspigmentation - Recommend daily broad spectrum sunscreen SPF 30+ to sun-exposed areas, reapply every 2 hours as needed.  - Staying in the shade or wearing long sleeves, sun glasses (UVA+UVB protection) and wide brim hats (4-inch brim around the entire circumference of the hat) are also recommended for sun protection.  - Call for new or changing lesions.  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  - Call if any new or changing lesions are noted between office visits  ACTINIC KERATOSIS Exam: Erythematous thin papules/macules with gritty scale at the right Cheek  Actinic keratoses are precancerous spots that appear secondary to cumulative UV radiation exposure/sun exposure over time. They are chronic with expected duration over 1 year. A portion of actinic keratoses will progress to squamous cell carcinoma of the skin. It is not possible to reliably predict which spots will progress to skin cancer and so treatment is recommended to prevent development of skin cancer.  Recommend daily broad spectrum sunscreen SPF 30+ to sun-exposed areas, reapply every 2 hours as needed.  Recommend staying in the shade or wearing long sleeves, sun glasses (UVA+UVB protection) and wide brim hats (4-inch brim around the entire circumference of the hat). Call for new or changing lesions.  Treatment Plan: - Cryo Therapy Completed while in office today  INTERTRIGO Exam: Erythematous macerated patches in body folds  Flared  Intertrigo is a chronic recurrent rash that occurs in  skin fold areas that may be associated with friction; heat; moisture; yeast; fungus; and bacteria.  It is exacerbated by  increased movement / activity; sweating; and higher atmospheric temperature.  Use of an absorbant powder such as Zeasorb AF powder or other OTC antifungal powder to the area daily can prevent rash recurrence. Other options to help keep the area dry include blow drying the area after bathing or using antiperspirant products such as Duradry sweat minimizing gel.  Treatment Plan: - Recommended washing with Anti-bacterial soaps - Recommended continuing Ketoconazole  until redness at groin resolves - Recommended to apply Z abosorb powder daily  SKIN CANCER SCREENING PERFORMED TODAY.  AK (ACTINIC KERATOSIS) Right Buccal Cheek Destruction of lesion - Right Buccal Cheek Complexity: simple   Destruction method: cryotherapy   Informed consent: discussed and consent obtained   Timeout:  patient name, date of birth, surgical site, and procedure verified Lesion destroyed using liquid nitrogen: Yes   Cryotherapy cycles:  1 Post-procedure details: wound care instructions given     Return in about 1 year (around 12/19/2024) for TBSE.  I, Jetta Ager, am acting as neurosurgeon for [employer], DO.  Documentation: I have reviewed the above documentation for accuracy and completeness, and I agree with the above.  Delon Lenis, DO

## 2023-12-30 ENCOUNTER — Other Ambulatory Visit: Payer: Self-pay | Admitting: Family

## 2023-12-30 ENCOUNTER — Other Ambulatory Visit: Payer: Self-pay | Admitting: Physician Assistant

## 2023-12-30 DIAGNOSIS — J301 Allergic rhinitis due to pollen: Secondary | ICD-10-CM

## 2023-12-30 NOTE — Progress Notes (Signed)
 [facility]   MULTIDISCIPLINARY HEART VALVE CLINIC                                     Cardiology Office Note:    Date:  01/03/2024   ID:  Heather Crawford, Heather Crawford 1945/09/30, MRN [id number]  PCP:  Joshua Debby CROME, MD  [facility] Cardiologist:  Ozell Fell, MD  [facility] Structural heart: Ozell Fell, MD [facility] Electrophysiologist:  Will Gladis Norton, MD   Referring MD: Joshua Debby CROME, MD   1 year s/p TAVR  History of Present Illness:    Heather Crawford is a 78 y.o. 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  Echo 10/14/22 showed EF 55% and severe bioprosthetic aortic valve stenosis with a mean gradient of , mild AI, as well as severe MAC with mild MR. [facility] 11/26/22 showed patent left main, LAD, and left circumflex, with calcification and mild nonobstructive plaquing stable from the previous cardiac catheterization study as well as chronic occlusion of the RCA with patent RIMA to RCA graft. S/p VIV TAVR with a 23 mm Edwards Sapien 3 Ultra Resilia THV via the TF approach on 01/05/23. Post operative echo showed EF 55%, mod concentric LVH, normally functioning TAVR with a mean gradient of 13.3 mmHg and no PVL as well as moderate MAC and mild MR. Discharged on aspirin  and Plavix  x 3 months followed by aspirin  alone.    Today the patient presents to clinic for follow up. Here with her husband. Still has some mild DOE but much better since TAVR. No chest pain but sometimes has indigestion. Has some pain at her pacemaker site from time to time. She likes to stay active taking care of grandkids and cook. No LE edema, orthopnea or PND. No dizziness or syncope.  No blood in stool or urine. No palpitations.       Past Medical History:  Diagnosis Date   Bilateral carotid artery disease 08/15/2012   Carotid US  11/2021: Bilat ICA 1-39   CAD (coronary artery disease)    a. s/p CABG 4/12: RIMA-RCA   Carotid artery disease    Carotid US  10/21: Bilateral ICA 1-39; left subclavian stenosis   CHF (congestive heart failure) (HCC)    chronic diastolic CHF   Chronic back pain    H/O BACK SURGERY   Coronary artery disease    adenosine  myoview  2/12 w/EF 56%, ISCHEMIA W/SCAR IN THE MID TO APICAL ANTERIO WALL, SEPTAL WALL, AND APEX. LHC 3/12 W/EF 55%, 40% OSTIAL RCA, 60% MID RCA, 95% DISTAL RC A, INTERVENTION  ATTEMPTED BUT UNABLE TO ADEQUADATELY SEAT  CATHETER.   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 Wonda and Maryjane. The surgical valve was fractured.   Squamous cell carcinoma of skin    nose   Stenosis of left subclavian artery    Carotid US  10/22: Bilateral ICA 1-39; L subclavian stenosis   Valvular heart disease s/p AVR in 2012 02/07/2010   AoV Replacement with Bovine prosthesis ' 12 Echo 2/23: EF 60-65, no RWMA, GR 1 DD, normal RVSF, normal PASP, AVR without aortic stenosis or AI (mean gradient 22 mmHg)     Current Medications: Current Meds  Medication Sig   albuterol  (VENTOLIN  HFA) 108 (90 Base) MCG/ACT inhaler Inhale 2 puffs into the lungs every 4 (four) hours as needed for wheezing or shortness of breath.   aspirin  EC 81 MG tablet Take 1 tablet (81 mg total) by  mouth daily. Swallow whole.   azelastine  (ASTELIN ) 0.1 % nasal spray Place 2 sprays into both nostrils 2 (two) times daily as directed   azithromycin  (ZITHROMAX ) 500 MG tablet Take 1 tablet (500 mg total) by mouth as directed. Take one tablet 1 hour before any dental work including cleanings.   carboxymethylcellulose (REFRESH TEARS) 0.5 % SOLN Place 1 drop into both eyes 3 (three) times daily as needed (Burning eyes).   Cholecalciferol  2000 units TABS Take 1 tablet (2,000 Units total) by mouth daily.   ezetimibe  (ZETIA ) 10 MG tablet Take 1 tablet (10 mg total) by mouth daily.   fluticasone  (FLONASE ) 50 MCG/ACT nasal spray USE 1 SPRAY IN EACH NOSTRIL EVERY DAY   furosemide  (LASIX ) 20 MG tablet Take 1 tablet (20 mg total) by mouth daily.   irbesartan  (AVAPRO ) 150 MG tablet Take 0.5 tablets (75 mg total) by mouth in the morning and at bedtime.   isosorbide  mononitrate (IMDUR ) 30 MG 24 hr tablet Take 1 tablet (30 mg total) by mouth daily.   montelukast  (SINGULAIR ) 10 MG tablet Take 1 tablet (10 mg total) by mouth at bedtime.   nitroGLYCERIN  (NITROSTAT ) 0.4 MG SL tablet Place 1 tablet (0.4 mg total) under the tongue every 5 (five) minutes as needed for chest pain.   omeprazole  (PRILOSEC) 20 MG capsule Take 1 capsule (20 mg total) by mouth daily.   simvastatin  (ZOCOR ) 40 MG tablet Take 1 tablet (40 mg total) by mouth daily.      ROS:   Please see the history of present illness.    All other systems reviewed and are negative.  EKGs       Risk Assessment/Calculations:           Physical Exam:    VS:  BP 120/60   Pulse 60   Ht 5' 2 (1.575 m)   Wt 165 lb 3.2 oz (74.9 kg)   SpO2 96%   BMI 30.22 kg/m     Wt Readings from Last 3 Encounters:  01/03/24 165 lb 3.2 oz (74.9 kg)  10/19/23 167 lb 9.6 oz (76 kg)  07/12/23 167 lb 6.4 oz (75.9 kg)     GEN: Well nourished, well developed in no acute distress NECK: No JVD CARDIAC: RRR, no murmurs, rubs, gallops RESPIRATORY:  Clear to  auscultation without rales, wheezing or rhonchi  ABDOMEN: Soft, non-tender, non-distended EXTREMITIES:  mild edema; No deformity.   ASSESSMENT:    1. S/P VIV TAVR (transcatheter aortic valve replacement)   2. Essential hypertension   3.  Pacemaker   4. Chronic heart failure with preserved ejection fraction (HFpEF) (HCC)   5. Pulmonary nodules     PLAN:    In order of problems listed above:  Severe bioprosthetic AS s/p VIV TAVR:  -- Echo today shows EF 60%, mod MAC with mild MR, normally functioning TAVR with a mean gradient of 9 mm hg and no PVL.  -- NYHA class I symptoms with an improvement in symptoms since TAVR. -- SBE discussed; she has azithromycin .  -- Continue aspirin  81mg  daily. -- Continue regular follow up with Dr. Annamarie Glendia Ferrier.    HTN:  -- BP elevated but improved at the end of the visit to 120/60. -- Continue irbesartan  37.5mg  BID, imdur  30mg  daily and Lasix  20mg  daily.   CHB s/p PPM:  -- Followed by Dr. Inocencio.   CAD:  -- LHC 11/26/22 showed patent left main, LAD, and left circumflex, with calcification and mild nonobstructive plaquing stable from the previous cardiac catheterization study as well as chronic occlusion of the RCA with patent RIMA to RCA graft.  -- Continue medical therapy with aspirin  81mg  daily and simvastatin  40mg  daily.    HFpEF:  -- Appears euvolemic.  -- Continue Lasix  20mg  daily.   Pulmonary nodules:  -- Pre TAVR CT showed small solid pulmonary nodules in the right middle lobe along the minor fissure, largest 0.3 cm.  -- Follow up CT 11/04/23 showed definitely benign nodules with no future scans needed.   edication Adjustments/Labs and Tests Ordered: Current medicines are reviewed at length with the patient today.  Concerns regarding medicines are outlined above.  No orders of the defined types were placed in this encounter.  Meds ordered this encounter  Medications   azithromycin  (ZITHROMAX ) 500 MG tablet    Sig: Take 1  tablet (500 mg total) by mouth as directed. Take one tablet 1 hour before any dental work including cleanings.    Dispense:  6 tablet    Refill:  12    Supervising Provider:   WONDA SHARPER [[id number]]    Patient Instructions  Medication Instructions:  Your physician recommends that you continue on your current medications as directed. Please refer to the Current Medication list given to you today.  A refill of Azithromycin  has been sent in to your pharmacy.  *If you need a refill on your cardiac medications before your next appointment, please call your pharmacy*  Lab Work: None needed If you have labs (blood work) drawn today and your tests are completely normal, you will receive your results only by: MyChart Message (if you have MyChart) OR A paper copy in the mail If you have any lab test that is abnormal or we need to change your treatment, we will call you to review the results.  Testing/Procedures: None needed  Follow-Up: At [facility], you and your health needs are our priority.  As part of our continuing mission to provide you with exceptional heart care, our providers are all part of one team.  This team includes your primary Cardiologist (physician) and Advanced Practice Providers or APPs (Physician Assistants and Nurse Practitioners) who all work together to provide you with the care you need, when you need it.  Your next appointment:   6 month(s)  Provider:   Glendia Ferrier, PA-C          We recommend signing up for the patient portal called MyChart.  Sign up information is provided on this After Visit Summary.  MyChart is used to  connect with patients for Virtual Visits (Telemedicine).  Patients are able to view lab/test results, encounter notes, upcoming appointments, etc.  Non-urgent messages can be sent to your provider as well.   To learn more about what you can do with MyChart, go to [email or web address].           Signed, Lamarr Hummer, PA-C   01/03/2024 10:17 PM    [facility]

## 2023-12-31 ENCOUNTER — Other Ambulatory Visit: Payer: Self-pay

## 2023-12-31 ENCOUNTER — Other Ambulatory Visit (HOSPITAL_COMMUNITY): Payer: Self-pay

## 2024-01-03 ENCOUNTER — Ambulatory Visit (HOSPITAL_COMMUNITY)
Admission: RE | Admit: 2024-01-03 | Discharge: 2024-01-03 | Disposition: A | Discharge status: Home / Self Care | Source: Ambulatory Visit | Attending: Cardiovascular Disease | Admitting: Cardiovascular Disease

## 2024-01-03 ENCOUNTER — Ambulatory Visit: Attending: Physician Assistant | Admitting: Physician Assistant

## 2024-01-03 ENCOUNTER — Other Ambulatory Visit: Payer: Self-pay

## 2024-01-03 ENCOUNTER — Other Ambulatory Visit (HOSPITAL_COMMUNITY): Payer: Self-pay

## 2024-01-03 VITALS — BP 120/60 | HR 60 | Ht 62.0 in | Wt 165.2 lb

## 2024-01-03 DIAGNOSIS — R918 Other nonspecific abnormal finding of lung field: Secondary | ICD-10-CM | POA: Diagnosis not present

## 2024-01-03 DIAGNOSIS — I340 Nonrheumatic mitral (valve) insufficiency: Secondary | ICD-10-CM

## 2024-01-03 DIAGNOSIS — Z952 Presence of prosthetic heart valve: Secondary | ICD-10-CM | POA: Diagnosis not present

## 2024-01-03 DIAGNOSIS — I361 Nonrheumatic tricuspid (valve) insufficiency: Secondary | ICD-10-CM | POA: Diagnosis not present

## 2024-01-03 DIAGNOSIS — I5032 Chronic diastolic (congestive) heart failure: Secondary | ICD-10-CM

## 2024-01-03 DIAGNOSIS — Z950 Presence of cardiac pacemaker: Secondary | ICD-10-CM | POA: Diagnosis not present

## 2024-01-03 DIAGNOSIS — I10 Essential (primary) hypertension: Secondary | ICD-10-CM | POA: Diagnosis not present

## 2024-01-03 LAB — ECHOCARDIOGRAM COMPLETE
AR max vel: 2.85 cm2
AV Area VTI: 2.6 cm2
AV Area mean vel: 2.79 cm2
AV Mean grad: 9 mmHg
AV Peak grad: 15.7 mmHg
Ao pk vel: 1.98 m/s
Area-P 1/2: 2.95 cm2
S' Lateral: 3 cm

## 2024-01-03 MED ORDER — AZITHROMYCIN 500 MG PO TABS
500.0000 mg | ORAL_TABLET | ORAL | 12 refills | Status: AC
  Filled 2024-01-03: qty 6, 6d supply, fill #0
  Filled 2024-01-08: qty 6, 6d supply, fill #1
  Filled 2024-02-07: qty 6, 6d supply, fill #2

## 2024-01-03 NOTE — Patient Instructions (Addendum)
 Medication Instructions:  Your physician recommends that you continue on your current medications as directed. Please refer to the Current Medication list given to you today.  A refill of Azithromycin  has been sent in to your pharmacy.  *If you need a refill on your cardiac medications before your next appointment, please call your pharmacy*  Lab Work: None needed If you have labs (blood work) drawn today and your tests are completely normal, you will receive your results only by: MyChart Message (if you have MyChart) OR A paper copy in the mail If you have any lab test that is abnormal or we need to change your treatment, we will call you to review the results.  Testing/Procedures: None needed  Follow-Up: At [facility], you and your health needs are our priority.  As part of our continuing mission to provide you with exceptional heart care, our providers are all part of one team.  This team includes your primary Cardiologist (physician) and Advanced Practice Providers or APPs (Physician Assistants and Nurse Practitioners) who all work together to provide you with the care you need, when you need it.  Your next appointment:   6 month(s)  Provider:   Glendia Ferrier, PA-C          We recommend signing up for the patient portal called MyChart.  Sign up information is provided on this After Visit Summary.  MyChart is used to connect with patients for Virtual Visits (Telemedicine).  Patients are able to view lab/test results, encounter notes, upcoming appointments, etc.  Non-urgent messages can be sent to your provider as well.   To learn more about what you can do with MyChart, go to [email or web address].

## 2024-01-04 ENCOUNTER — Other Ambulatory Visit (HOSPITAL_COMMUNITY): Payer: Self-pay

## 2024-01-04 MED ORDER — EZETIMIBE 10 MG PO TABS
10.0000 mg | ORAL_TABLET | Freq: Every day | ORAL | 2 refills | Status: AC
  Filled 2024-01-04 – 2024-02-07 (×2): qty 90, 90d supply, fill #0

## 2024-01-04 MED ORDER — SIMVASTATIN 40 MG PO TABS
40.0000 mg | ORAL_TABLET | Freq: Every day | ORAL | 2 refills | Status: AC
  Filled 2024-01-04 – 2024-02-07 (×2): qty 90, 90d supply, fill #0

## 2024-01-04 MED ORDER — ISOSORBIDE MONONITRATE ER 30 MG PO TB24
30.0000 mg | ORAL_TABLET | Freq: Every day | ORAL | 2 refills | Status: AC
  Filled 2024-01-04 – 2024-02-07 (×2): qty 90, 90d supply, fill #0

## 2024-01-05 ENCOUNTER — Other Ambulatory Visit (HOSPITAL_COMMUNITY): Payer: Self-pay

## 2024-01-06 ENCOUNTER — Ambulatory Visit: Payer: Self-pay | Admitting: Internal Medicine

## 2024-01-06 ENCOUNTER — Encounter: Payer: Self-pay | Admitting: Internal Medicine

## 2024-01-06 ENCOUNTER — Ambulatory Visit: Admitting: Internal Medicine

## 2024-01-06 VITALS — BP 144/62 | HR 63 | Temp 97.8°F | Resp 16 | Ht 62.0 in | Wt 165.8 lb

## 2024-01-06 DIAGNOSIS — E782 Mixed hyperlipidemia: Secondary | ICD-10-CM

## 2024-01-06 DIAGNOSIS — N1831 Chronic kidney disease, stage 3a: Secondary | ICD-10-CM | POA: Insufficient documentation

## 2024-01-06 DIAGNOSIS — I10 Essential (primary) hypertension: Secondary | ICD-10-CM

## 2024-01-06 DIAGNOSIS — J309 Allergic rhinitis, unspecified: Secondary | ICD-10-CM

## 2024-01-06 DIAGNOSIS — K219 Gastro-esophageal reflux disease without esophagitis: Secondary | ICD-10-CM

## 2024-01-06 LAB — BASIC METABOLIC PANEL WITH GFR
BUN: 22 mg/dL (ref 6–23)
CO2: 29 meq/L (ref 19–32)
Calcium: 9.2 mg/dL (ref 8.4–10.5)
Chloride: 100 meq/L (ref 96–112)
Creatinine, Ser: 0.95 mg/dL (ref 0.40–1.20)
GFR: 56.61 mL/min — ABNORMAL LOW (ref >60.00)
Glucose, Bld: 166 mg/dL — ABNORMAL HIGH (ref 70–99)
Potassium: 4.1 meq/L (ref 3.5–5.1)
Sodium: 139 meq/L (ref 135–145)

## 2024-01-06 LAB — HEPATIC FUNCTION PANEL
ALT: 15 U/L (ref 0–35)
AST: 16 U/L (ref 0–37)
Albumin: 4.4 g/dL (ref 3.5–5.2)
Alkaline Phosphatase: 54 U/L (ref 39–117)
Bilirubin, Direct: 0.1 mg/dL (ref 0.0–0.3)
Total Bilirubin: 0.5 mg/dL (ref 0.2–1.2)
Total Protein: 7.1 g/dL (ref 6.0–8.3)

## 2024-01-06 LAB — CBC WITH DIFFERENTIAL/PLATELET
Basophils Absolute: 0.1 K/uL (ref 0.0–0.1)
Basophils Relative: 0.8 % (ref 0.0–3.0)
Eosinophils Absolute: 0.1 K/uL (ref 0.0–0.7)
Eosinophils Relative: 1.1 % (ref 0.0–5.0)
HCT: 35.5 % — ABNORMAL LOW (ref 36.0–46.0)
Hemoglobin: 12.1 g/dL (ref 12.0–15.0)
Lymphocytes Relative: 26.8 % (ref 12.0–46.0)
Lymphs Abs: 2 K/uL (ref 0.7–4.0)
MCHC: 34 g/dL (ref 30.0–36.0)
MCV: 88.5 fl (ref 78.0–100.0)
Monocytes Absolute: 0.5 K/uL (ref 0.1–1.0)
Monocytes Relative: 7.2 % (ref 3.0–12.0)
Neutro Abs: 4.7 K/uL (ref 1.4–7.7)
Neutrophils Relative %: 64.1 % (ref 43.0–77.0)
Platelets: 192 K/uL (ref 150.0–400.0)
RBC: 4.01 Mil/uL (ref 3.87–5.11)
RDW: 12.9 % (ref 11.5–15.5)
WBC: 7.4 K/uL (ref 4.0–10.5)

## 2024-01-06 LAB — HEMOGLOBIN A1C: Hgb A1c MFr Bld: 7.3 % — ABNORMAL HIGH (ref 4.6–6.5)

## 2024-01-06 LAB — TSH: TSH: 7.87 u[IU]/mL — ABNORMAL HIGH (ref 0.35–5.50)

## 2024-01-06 MED ORDER — EMPAGLIFLOZIN 10 MG PO TABS
10.0000 mg | ORAL_TABLET | Freq: Every day | ORAL | 1 refills | Status: AC
  Filled 2024-01-06 – 2024-01-08 (×2): qty 90, 90d supply, fill #0

## 2024-01-06 MED ORDER — OMEPRAZOLE 20 MG PO CPDR
20.0000 mg | DELAYED_RELEASE_CAPSULE | Freq: Every day | ORAL | 1 refills | Status: AC
  Filled 2024-01-06 – 2024-01-08 (×2): qty 90, 90d supply, fill #0

## 2024-01-06 MED ORDER — FLUTICASONE PROPIONATE 50 MCG/ACT NA SUSP
2.0000 | Freq: Every day | NASAL | 1 refills | Status: AC
  Filled 2024-01-06 – 2024-01-08 (×2): qty 48, 90d supply, fill #0

## 2024-01-06 NOTE — Progress Notes (Unsigned)
 Subjective:  Patient ID: Heather Crawford, female    DOB: [birth year]  Age: 78 y.o. MRN: [id number]  CC: Diabetes (6 month follow up ), Hyperlipidemia, and Hypertension   HPI Heather Crawford presents for f/up ---  Discussed the use of AI scribe software for clinical note transcription with the patient, who gave verbal consent to proceed.  History of Present Illness SOUMYA Crawford is an 78 year old female who presents for a follow-up visit.  She experiences dizziness when bending down to pick something up, as noted by her grandchildren. No chest pain or shortness of breath occurs during physical activity. She experiences dizziness when bending down to pick something up.  Her blood pressure tends to be high during doctor visits but normalizes before leaving, which her daughter attributes to 'white coat syndrome'. Recent measurements were 144/62 and 120/60.  She reports episodes of heartburn and regurgitation, particularly at night, but denies painful or difficult swallowing.  She uses Flonase  occasionally for nasal congestion at night, which reduces her snoring. She is not currently using any allergy medication as she is not experiencing symptoms.  She has not received the latest COVID vaccine and has had three doses previously. She attempted to get a shingles vaccine but was informed of an allergy to an ingredient in the vaccine.  She experiences occasional thirst but denies symptoms of excessive thirst or urination. Her family encourages her to drink more fluids.     Outpatient Medications Prior to Visit  Medication Sig Dispense Refill   albuterol  (VENTOLIN  HFA) 108 (90 Base) MCG/ACT inhaler Inhale 2 puffs into the lungs every 4 (four) hours as needed for wheezing or shortness of breath. 6.7 g 0   azelastine  (ASTELIN ) 0.1 % nasal spray Place 2 sprays into both nostrils 2 (two) times daily as directed 90 mL 1   azithromycin  (ZITHROMAX ) 500 MG tablet Take 1 tablet (500 mg  total) by mouth as directed. Take one tablet 1 hour before any dental work including cleanings. 6 tablet 12   ezetimibe  (ZETIA ) 10 MG tablet Take 1 tablet (10 mg total) by mouth daily. 90 tablet 2   furosemide  (LASIX ) 20 MG tablet Take 1 tablet (20 mg total) by mouth daily. 90 tablet 3   irbesartan  (AVAPRO ) 150 MG tablet Take 0.5 tablets (75 mg total) by mouth in the morning and at bedtime. 90 tablet 3   isosorbide  mononitrate (IMDUR ) 30 MG 24 hr tablet Take 1 tablet (30 mg total) by mouth daily. 90 tablet 2   montelukast  (SINGULAIR ) 10 MG tablet Take 1 tablet (10 mg total) by mouth at bedtime. 90 tablet 1   nitroGLYCERIN  (NITROSTAT ) 0.4 MG SL tablet Place 1 tablet (0.4 mg total) under the tongue every 5 (five) minutes as needed for chest pain. 25 tablet 11   simvastatin  (ZOCOR ) 40 MG tablet Take 1 tablet (40 mg total) by mouth daily. 90 tablet 2   fluticasone  (FLONASE ) 50 MCG/ACT nasal spray USE 1 SPRAY IN EACH NOSTRIL EVERY DAY 32 g 5   omeprazole  (PRILOSEC) 20 MG capsule Take 1 capsule (20 mg total) by mouth daily. 90 capsule 1   aspirin  EC 81 MG tablet Take 1 tablet (81 mg total) by mouth daily. Swallow whole. 90 tablet 3   carboxymethylcellulose (REFRESH TEARS) 0.5 % SOLN Place 1 drop into both eyes 3 (three) times daily as needed (Burning eyes).     Cholecalciferol  2000 units TABS Take 1 tablet (2,000 Units total) by mouth daily. 90 tablet  1   No facility-administered medications prior to visit.    ROS Review of Systems  Objective:  BP (!) 144/62 (BP Location: Left Arm, Patient Position: Sitting, Cuff Size: Normal)   Pulse 63   Temp 97.8 F (36.6 C) (Oral)   Resp 16   Ht 5' 2 (1.575 m)   Wt 165 lb 12.8 oz (75.2 kg)   SpO2 94%   BMI 30.33 kg/m   BP Readings from Last 3 Encounters:  01/06/24 (!) 144/62  01/03/24 120/60  12/20/23 107/66    Wt Readings from Last 3 Encounters:  01/06/24 165 lb 12.8 oz (75.2 kg)  01/03/24 165 lb 3.2 oz (74.9 kg)  10/19/23 167 lb 9.6 oz (76  kg)    Physical Exam Cardiovascular:     Heart sounds: Murmur heard.     Systolic murmur is present with a grade of 1/6.     No diastolic murmur is present.     No gallop.     Lab Results  Component Value Date   WBC 7.4 01/06/2024   HGB 12.1 01/06/2024   HCT 35.5 (L) 01/06/2024   PLT 192.0 01/06/2024   GLUCOSE 166 (H) 01/06/2024   CHOL 129 07/07/2023   TRIG 150.0 (H) 07/07/2023   HDL 49.00 07/07/2023   LDLDIRECT 76.0 06/24/2020   LDLCALC 50 07/07/2023   ALT 15 01/06/2024   AST 16 01/06/2024   NA 139 01/06/2024   K 4.1 01/06/2024   CL 100 01/06/2024   CREATININE 0.95 01/06/2024   BUN 22 01/06/2024   CO2 29 01/06/2024   TSH 7.87 (H) 01/06/2024   INR 1.0 01/04/2023   HGBA1C 7.3 (H) 01/06/2024   MICROALBUR 1.3 07/07/2023    ECHOCARDIOGRAM COMPLETE Result Date: 01/03/2024    ECHOCARDIOGRAM REPORT   Patient Name:   Heather Crawford Date of Exam: 01/03/2024 Medical Rec #:  [id number]          Height:       62.0 in Accession #:    [id number]         Weight:       167.6 lb Date of Birth:  03-24-1945          BSA:          1.773 m Patient Age:    78 years           BP:           193/85 mmHg Patient Gender: F                  HR:           60 bpm. Exam Location:  [address] Procedure: 2D Echo, Cardiac Doppler, Color Doppler and 3D Echo (Both Spectral            and Color Flow Doppler were utilized during procedure). 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 CAD, Arrythmias:LBBB; Risk                 Factors:Dyslipidemia.                 Aortic Valve: 23 mm Edwards Sapien prosthetic, stented (TAVR)                 valve is present  in the aortic position. Procedure Date:                 01/05/2023.  Sonographer:    Augustin Seals RDCS Referring Phys: [id number] KATHERINE ANNE THOMPSON IMPRESSIONS  1. Left ventricular ejection fraction, by  estimation, is 60 to 65%. Left ventricular ejection fraction by 3D volume is 71 %. The left ventricle has normal function. The left ventricle has no regional wall motion abnormalities. There is mild concentric left ventricular hypertrophy. Left ventricular diastolic parameters are consistent with Grade I diastolic dysfunction (impaired relaxation).  2. Right ventricular systolic function is normal. The right ventricular size is normal. There is normal pulmonary artery systolic pressure. The estimated right ventricular systolic pressure is 30.9 mmHg.  3. Left atrial size was mildly dilated.  4. The mitral valve is degenerative. Mild mitral valve regurgitation. No evidence of mitral stenosis. Moderate mitral annular calcification.  5. The aortic valve has been repaired/replaced. Aortic valve regurgitation is not visualized. No aortic stenosis is present. 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 Procedure Date: 01/05/2023. Echo findings  are consistent with normal structure and function of the aortic valve prosthesis. Aortic valve Vmax measures 1.98 m/s.  6. The inferior vena cava is normal in size with greater than 50% respiratory variability, suggesting right atrial pressure of 3 mmHg. FINDINGS  Left Ventricle: Left ventricular ejection fraction, by estimation, is 60 to 65%. Left ventricular ejection fraction by 3D volume is 71 %. The left ventricle has normal function. The left ventricle has no regional wall motion abnormalities. The left ventricular internal cavity size was normal in size. There is mild concentric left ventricular hypertrophy. Left ventricular diastolic parameters are consistent with Grade I diastolic dysfunction (impaired relaxation). Right Ventricle: The right ventricular size is normal. No increase in right ventricular wall thickness. Right ventricular systolic function is normal. There is normal pulmonary artery systolic pressure. The tricuspid  regurgitant velocity is 2.64 m/s, and  with an assumed right atrial pressure of 3 mmHg, the estimated right ventricular systolic pressure is 30.9 mmHg. Left Atrium: Left atrial size was mildly dilated. Right Atrium: Right atrial size was normal in size. Pericardium: There is no evidence of pericardial effusion. Mitral Valve: The mitral valve is degenerative in appearance. Moderate mitral annular calcification. Mild mitral valve regurgitation. No evidence of mitral valve stenosis. Tricuspid Valve: The tricuspid valve is normal in structure. Tricuspid valve regurgitation is mild . No evidence of tricuspid stenosis. Aortic Valve: The aortic valve has been repaired/replaced. Aortic valve regurgitation is not visualized. No aortic stenosis is present. Aortic valve mean gradient measures 9.0 mmHg. Aortic valve peak gradient measures 15.7 mmHg. Aortic valve area, by VTI  measures 2.60 cm. There is a 23 mm Edwards Sapien prosthetic, stented (TAVR) valve present in the aortic position. Procedure Date: 01/05/2023. Echo findings are consistent with normal structure and function of the aortic valve prosthesis. Pulmonic Valve: The pulmonic valve was normal in structure. Pulmonic valve regurgitation is trivial. No evidence of pulmonic stenosis. Aorta: The aortic root is normal in size and structure. Venous: The inferior vena cava is normal in size with greater than 50% respiratory variability, suggesting right atrial pressure of 3 mmHg. IAS/Shunts: No atrial level shunt detected by color flow Doppler. Additional Comments: 3D was performed not requiring image post processing on an independent workstation and was normal.  LEFT VENTRICLE PLAX 2D LVIDd:         4.50 cm  Diastology LVIDs:         3.00 cm         LV e' medial:    3.81 cm/s LV PW:         1.00 cm         LV E/e' medial:  28.1 LV IVS:        1.10 cm         LV e' lateral:   5.66 cm/s LVOT diam:     2.30 cm         LV E/e' lateral: 18.9 LV SV:         131 LV SV  Index:   74 LVOT Area:     4.15 cm        3D Volume EF                                LV 3D EF:    Left                                             ventricul                                             ar                                             ejection                                             fraction                                             by 3D                                             volume is                                             71 %.                                 3D Volume EF:                                3D EF:        71 %                                LV  EDV:       76 ml                                LV ESV:       22 ml                                LV SV:        54 ml RIGHT VENTRICLE            IVC RV Basal diam:  3.70 cm    IVC diam: 1.50 cm RV Mid diam:    2.30 cm RV S prime:     9.79 cm/s  PULMONARY VEINS TAPSE (M-mode): 1.6 cm     A Reversal Velocity: 38.20 cm/s                            Diastolic Velocity:  34.80 cm/s                            S/D Velocity:        1.40                            Systolic Velocity:   48.80 cm/s LEFT ATRIUM           Index        RIGHT ATRIUM           Index LA diam:      4.40 cm 2.48 cm/m   RA Area:     11.70 cm LA Vol (A2C): 31.3 ml 17.65 ml/m  RA Volume:   23.50 ml  13.25 ml/m LA Vol (A4C): 50.9 ml 28.70 ml/m  AORTIC VALVE AV Area (Vmax):    2.85 cm AV Area (Vmean):   2.79 cm AV Area (VTI):     2.60 cm AV Vmax:           198.00 cm/s AV Vmean:          135.000 cm/s AV VTI:            0.503 m AV Peak Grad:      15.7 mmHg AV Mean Grad:      9.0 mmHg LVOT Vmax:         136.00 cm/s LVOT Vmean:        90.700 cm/s LVOT VTI:          0.315 m LVOT/AV VTI ratio: 0.63  AORTA Ao Root diam: 2.90 cm Ao Asc diam:  3.20 cm MITRAL VALVE                TRICUSPID VALVE MV Area (PHT): 2.95 cm     TR Peak grad:   27.9 mmHg MV Decel Time: 257 msec     TR Vmax:        264.00 cm/s MV E velocity: 107.00 cm/s MV A velocity: 135.00 cm/s  SHUNTS MV E/A  ratio:  0.79         Systemic VTI:  0.32 m                             Systemic Diam: 2.30 cm Toribio Fuel MD Electronically signed by Daniel Bensimhon  MD Signature Date/Time: 01/03/2024/5:39:45 PM    Final     Estimated Creatinine Clearance: 44.8 mL/min (by C-G formula based on SCr of 0.95 mg/dL).   Assessment & Plan:  Essential hypertension -     Basic metabolic panel with GFR; Future -     CBC with Differential/Platelet; Future -     TSH; Future  Stage 3a chronic kidney disease (HCC) -     Basic metabolic panel with GFR; Future -     Empagliflozin ; Take 1 tablet (10 mg total) by mouth daily before breakfast.  Dispense: 90 tablet; Refill: 1  Type 2 diabetes mellitus with stage 3a chronic kidney disease, without long-term current use of insulin (HCC) -     Basic metabolic panel with GFR; Future -     Hemoglobin A1c; Future -     Empagliflozin ; Take 1 tablet (10 mg total) by mouth daily before breakfast.  Dispense: 90 tablet; Refill: 1  Mixed hyperlipidemia -     Hepatic function panel; Future -     TSH; Future  Atopic rhinitis -     Fluticasone  Propionate; Place 2 sprays into both nostrils daily.  Dispense: 48 g; Refill: 1  Gastroesophageal reflux disease without esophagitis -     Omeprazole ; Take 1 capsule (20 mg total) by mouth daily.  Dispense: 90 capsule; Refill: 1     Follow-up: Return in about 6 months (around 07/06/2024).  Debby Molt, MD

## 2024-01-06 NOTE — Patient Instructions (Signed)

## 2024-01-07 ENCOUNTER — Other Ambulatory Visit (HOSPITAL_BASED_OUTPATIENT_CLINIC_OR_DEPARTMENT_OTHER): Payer: Self-pay

## 2024-01-07 ENCOUNTER — Encounter: Payer: Self-pay | Admitting: Pharmacist

## 2024-01-07 ENCOUNTER — Other Ambulatory Visit (HOSPITAL_COMMUNITY): Payer: HMO

## 2024-01-07 ENCOUNTER — Other Ambulatory Visit: Payer: Self-pay

## 2024-01-07 ENCOUNTER — Ambulatory Visit: Payer: HMO

## 2024-01-07 ENCOUNTER — Other Ambulatory Visit (HOSPITAL_COMMUNITY): Payer: Self-pay

## 2024-01-07 DIAGNOSIS — J309 Allergic rhinitis, unspecified: Secondary | ICD-10-CM | POA: Insufficient documentation

## 2024-01-07 MED ORDER — MONTELUKAST SODIUM 10 MG PO TABS
10.0000 mg | ORAL_TABLET | Freq: Every day | ORAL | 1 refills | Status: AC
  Filled 2024-01-07: qty 90, 90d supply, fill #0

## 2024-01-08 ENCOUNTER — Other Ambulatory Visit (HOSPITAL_COMMUNITY): Payer: Self-pay

## 2024-01-09 ENCOUNTER — Other Ambulatory Visit (HOSPITAL_COMMUNITY): Payer: Self-pay

## 2024-01-10 ENCOUNTER — Encounter: Payer: Self-pay | Admitting: Pharmacist

## 2024-01-10 ENCOUNTER — Other Ambulatory Visit: Payer: Self-pay

## 2024-01-10 ENCOUNTER — Other Ambulatory Visit (HOSPITAL_COMMUNITY): Payer: Self-pay

## 2024-01-10 MED ORDER — AZELASTINE HCL 0.1 % NA SOLN
2.0000 | Freq: Two times a day (BID) | NASAL | 1 refills | Status: AC
  Filled 2024-01-10: qty 90, 75d supply, fill #0
  Filled 2024-02-07: qty 90, 75d supply, fill #1

## 2024-01-11 ENCOUNTER — Other Ambulatory Visit: Payer: Self-pay

## 2024-01-13 ENCOUNTER — Ambulatory Visit: Payer: Medicare HMO

## 2024-01-13 DIAGNOSIS — I442 Atrioventricular block, complete: Secondary | ICD-10-CM | POA: Diagnosis not present

## 2024-01-14 LAB — CUP PACEART REMOTE DEVICE CHECK
Battery Remaining Longevity: 134 mo
Battery Voltage: 3.02 V
Brady Statistic AP VP Percent: 0.02 %
Brady Statistic AP VS Percent: 16.65 %
Brady Statistic AS VP Percent: 0.04 %
Brady Statistic AS VS Percent: 83.3 %
Brady Statistic RA Percent Paced: 16.75 %
Brady Statistic RV Percent Paced: 0.06 %
Date Time Interrogation Session: 20251210212807
Implantable Lead Connection Status: 753985
Implantable Lead Connection Status: 753985
Implantable Lead Implant Date: 20211215
Implantable Lead Implant Date: 20211215
Implantable Lead Location: 753859
Implantable Lead Location: 753860
Implantable Lead Model: 5076
Implantable Lead Model: 5076
Implantable Pulse Generator Implant Date: 20211215
Lead Channel Impedance Value: 285 Ohm
Lead Channel Impedance Value: 342 Ohm
Lead Channel Impedance Value: 361 Ohm
Lead Channel Impedance Value: 399 Ohm
Lead Channel Pacing Threshold Amplitude: 0.5 V
Lead Channel Pacing Threshold Amplitude: 0.75 V
Lead Channel Pacing Threshold Pulse Width: 0.4 ms
Lead Channel Pacing Threshold Pulse Width: 0.4 ms
Lead Channel Sensing Intrinsic Amplitude: 0.5 mV
Lead Channel Sensing Intrinsic Amplitude: 0.5 mV
Lead Channel Sensing Intrinsic Amplitude: 10.5 mV
Lead Channel Sensing Intrinsic Amplitude: 10.5 mV
Lead Channel Setting Pacing Amplitude: 1.5 V
Lead Channel Setting Pacing Amplitude: 2 V
Lead Channel Setting Pacing Pulse Width: 0.4 ms
Lead Channel Setting Sensing Sensitivity: 1.2 mV
Zone Setting Status: 755011

## 2024-01-20 NOTE — Progress Notes (Signed)
 Remote PPM Transmission

## 2024-01-28 ENCOUNTER — Ambulatory Visit: Payer: Self-pay | Admitting: Cardiology

## 2024-02-08 ENCOUNTER — Other Ambulatory Visit: Payer: Self-pay

## 2024-02-08 ENCOUNTER — Other Ambulatory Visit (HOSPITAL_COMMUNITY): Payer: Self-pay

## 2024-04-13 ENCOUNTER — Ambulatory Visit

## 2024-07-03 ENCOUNTER — Ambulatory Visit

## 2024-07-13 ENCOUNTER — Ambulatory Visit

## 2024-10-12 ENCOUNTER — Ambulatory Visit

## 2024-12-26 ENCOUNTER — Ambulatory Visit: Admitting: Dermatology

## 2025-01-11 ENCOUNTER — Ambulatory Visit

## 2025-04-12 ENCOUNTER — Ambulatory Visit
